# Patient Record
Sex: Female | Born: 1945
Health system: Southern US, Community
[De-identification: ages and names within clinical notes are randomized; demographics above are authoritative.]

## PROBLEM LIST (undated history)

## (undated) DIAGNOSIS — M7710 Lateral epicondylitis, unspecified elbow: Secondary | ICD-10-CM

## (undated) DIAGNOSIS — C52 Malignant neoplasm of vagina: Secondary | ICD-10-CM

## (undated) DIAGNOSIS — I10 Essential (primary) hypertension: Secondary | ICD-10-CM

## (undated) DIAGNOSIS — E669 Obesity, unspecified: Secondary | ICD-10-CM

## (undated) DIAGNOSIS — H409 Unspecified glaucoma: Secondary | ICD-10-CM

## (undated) DIAGNOSIS — J309 Allergic rhinitis, unspecified: Secondary | ICD-10-CM

## (undated) DIAGNOSIS — D72819 Decreased white blood cell count, unspecified: Secondary | ICD-10-CM

## (undated) DIAGNOSIS — L259 Unspecified contact dermatitis, unspecified cause: Secondary | ICD-10-CM

## (undated) DIAGNOSIS — IMO0002 Reserved for concepts with insufficient information to code with codable children: Secondary | ICD-10-CM

## (undated) DIAGNOSIS — N6019 Diffuse cystic mastopathy of unspecified breast: Secondary | ICD-10-CM

## (undated) DIAGNOSIS — E7800 Pure hypercholesterolemia, unspecified: Secondary | ICD-10-CM

## (undated) DIAGNOSIS — K589 Irritable bowel syndrome without diarrhea: Secondary | ICD-10-CM

## (undated) DIAGNOSIS — K3189 Other diseases of stomach and duodenum: Secondary | ICD-10-CM

## (undated) DIAGNOSIS — K2100 Gastro-esophageal reflux disease with esophagitis, without bleeding: Secondary | ICD-10-CM

## (undated) DIAGNOSIS — F411 Generalized anxiety disorder: Secondary | ICD-10-CM

## (undated) DIAGNOSIS — N644 Mastodynia: Secondary | ICD-10-CM

## (undated) DIAGNOSIS — L989 Disorder of the skin and subcutaneous tissue, unspecified: Secondary | ICD-10-CM

## (undated) DIAGNOSIS — R1013 Epigastric pain: Secondary | ICD-10-CM

## (undated) HISTORY — DX: Allergic rhinitis, unspecified: J30.9

## (undated) HISTORY — DX: Gastro-esophageal reflux disease with esophagitis, without bleeding: K21.00

## (undated) HISTORY — DX: Disorder of the skin and subcutaneous tissue, unspecified: L98.9

## (undated) HISTORY — DX: Essential (primary) hypertension: I10

## (undated) HISTORY — DX: Mastodynia: N64.4

## (undated) HISTORY — DX: Decreased white blood cell count, unspecified: D72.819

## (undated) HISTORY — DX: Reserved for concepts with insufficient information to code with codable children: IMO0002

## (undated) HISTORY — DX: Other diseases of stomach and duodenum: R10.13

## (undated) HISTORY — DX: Unspecified contact dermatitis, unspecified cause: L25.9

## (undated) HISTORY — DX: Generalized anxiety disorder: F41.1

## (undated) HISTORY — PX: BREAST EXCISIONAL BIOPSY: SUR124

## (undated) HISTORY — DX: Unspecified glaucoma: H40.9

## (undated) HISTORY — DX: Lateral epicondylitis, unspecified elbow: M77.10

## (undated) HISTORY — DX: Pure hypercholesterolemia, unspecified: E78.00

## (undated) HISTORY — DX: Diffuse cystic mastopathy of unspecified breast: N60.19

## (undated) HISTORY — DX: Irritable bowel syndrome, unspecified: K58.9

## (undated) HISTORY — PX: BREAST LUMPECTOMY: SHX2

## (undated) HISTORY — DX: Malignant neoplasm of vagina: C52

## (undated) HISTORY — DX: Other diseases of stomach and duodenum: K31.89

## (undated) HISTORY — DX: Gastro-esophageal reflux disease with esophagitis: K21.0

## (undated) HISTORY — DX: Obesity, unspecified: E66.9

---

## 2005-01-08 ENCOUNTER — Ambulatory Visit: Payer: Self-pay

## 2006-01-14 ENCOUNTER — Ambulatory Visit: Payer: Self-pay | Admitting: Family Medicine

## 2006-04-14 ENCOUNTER — Ambulatory Visit: Payer: Self-pay | Admitting: Family Medicine

## 2006-08-26 ENCOUNTER — Ambulatory Visit: Payer: Self-pay | Admitting: Gastroenterology

## 2006-08-26 HISTORY — PX: COLONOSCOPY: SHX174

## 2007-01-18 ENCOUNTER — Ambulatory Visit: Payer: Self-pay | Admitting: Family Medicine

## 2007-04-12 LAB — HM COLONOSCOPY: HM COLON: NORMAL

## 2007-07-01 ENCOUNTER — Ambulatory Visit: Payer: Self-pay | Admitting: Family Medicine

## 2008-02-14 ENCOUNTER — Ambulatory Visit: Payer: Self-pay | Admitting: Family Medicine

## 2008-03-26 ENCOUNTER — Ambulatory Visit: Payer: Self-pay | Admitting: Family Medicine

## 2008-07-23 ENCOUNTER — Ambulatory Visit: Payer: Self-pay | Admitting: Family Medicine

## 2009-02-15 ENCOUNTER — Ambulatory Visit: Payer: Self-pay | Admitting: Family Medicine

## 2009-05-02 ENCOUNTER — Ambulatory Visit: Payer: Self-pay | Admitting: Family Medicine

## 2010-01-07 ENCOUNTER — Ambulatory Visit: Payer: Self-pay | Admitting: Family Medicine

## 2010-04-10 ENCOUNTER — Ambulatory Visit: Payer: Self-pay | Admitting: Family Medicine

## 2010-07-01 ENCOUNTER — Ambulatory Visit: Payer: Self-pay | Admitting: Internal Medicine

## 2010-07-16 ENCOUNTER — Ambulatory Visit: Payer: Self-pay | Admitting: Family Medicine

## 2010-12-30 ENCOUNTER — Ambulatory Visit: Payer: Self-pay | Admitting: Family Medicine

## 2011-04-02 LAB — HM PAP SMEAR: HM PAP: NORMAL

## 2011-04-24 ENCOUNTER — Ambulatory Visit: Payer: Self-pay | Admitting: Family Medicine

## 2012-05-12 ENCOUNTER — Ambulatory Visit: Payer: Self-pay | Admitting: Family Medicine

## 2012-08-22 ENCOUNTER — Ambulatory Visit: Payer: Self-pay | Admitting: Family Medicine

## 2013-05-01 ENCOUNTER — Encounter: Payer: Self-pay | Admitting: Cardiovascular Disease

## 2013-05-01 ENCOUNTER — Encounter: Payer: Self-pay | Admitting: *Deleted

## 2013-05-01 ENCOUNTER — Encounter (INDEPENDENT_AMBULATORY_CARE_PROVIDER_SITE_OTHER): Payer: Self-pay

## 2013-05-01 ENCOUNTER — Ambulatory Visit (INDEPENDENT_AMBULATORY_CARE_PROVIDER_SITE_OTHER): Payer: 59 | Admitting: Cardiovascular Disease

## 2013-05-01 VITALS — BP 106/74 | HR 66 | Ht 62.0 in | Wt 169.5 lb

## 2013-05-01 DIAGNOSIS — R079 Chest pain, unspecified: Secondary | ICD-10-CM | POA: Insufficient documentation

## 2013-05-01 DIAGNOSIS — M79602 Pain in left arm: Secondary | ICD-10-CM

## 2013-05-01 DIAGNOSIS — M79609 Pain in unspecified limb: Secondary | ICD-10-CM

## 2013-05-01 DIAGNOSIS — I10 Essential (primary) hypertension: Secondary | ICD-10-CM

## 2013-05-01 DIAGNOSIS — R9431 Abnormal electrocardiogram [ECG] [EKG]: Secondary | ICD-10-CM

## 2013-05-01 NOTE — Progress Notes (Signed)
Primary care physician: Dr. Thana Ates  HPI  This is a pleasant 67 year old African American female who was referred for evaluation of chest pain and abnormal EKG. She has no previous cardiac history. She has known history of hypertension which has been well-controlled on medications. She is not diabetic. There is no family history of premature coronary artery disease. She has been having discomfort at the left upper chest area close to the shoulder with some left arm discomfort. The discomfort is described as aching sensation. This happens mostly at rest and does not worsen with physical activities. She has mild exertional dyspnea. No orthopnea or PND. She denies any lower extremity edema. Recent EKG shows sinus rhythm with diffuse J-point elevation suggestive of early repolarization.  No Known Allergies   No current outpatient prescriptions on file prior to visit.   No current facility-administered medications on file prior to visit.     Past Medical History  Diagnosis Date  . Unspecified disorder of skin and subcutaneous tissue   . Allergic rhinitis, cause unspecified   . Obesity, unspecified   . Leukocytopenia, unspecified   . Contact dermatitis and other eczema, due to unspecified cause   . Reflux esophagitis   . Essential hypertension, benign   . Anxiety state, unspecified   . Thoracic or lumbosacral neuritis or radiculitis, unspecified   . Pure hypercholesterolemia   . Dyspepsia and other specified disorders of function of stomach   . Tietze's disease   . Mastodynia   . Diffuse cystic mastopathy   . Irritable bowel syndrome   . Lateral epicondylitis  of elbow   . Vaginal cancer     History     Past Surgical History  Procedure Laterality Date  . Breast lumpectomy Left   . Colonoscopy       Family History  Problem Relation Age of Onset  . Family history unknown: Yes     History   Social History  . Marital Status: Widowed    Spouse Name: N/A    Number of  Children: N/A  . Years of Education: N/A   Occupational History  . Not on file.   Social History Main Topics  . Smoking status: Never Smoker   . Smokeless tobacco: Not on file  . Alcohol Use: No  . Drug Use: No  . Sexual Activity: Not on file   Other Topics Concern  . Not on file   Social History Narrative  . No narrative on file     ROS A 10 point review of system was performed. It is negative other than that mentioned in the history of present illness.   PHYSICAL EXAM   BP 106/74  Pulse 66  Ht 5\' 2"  (1.575 m)  Wt 169 lb 8 oz (76.885 kg)  BMI 30.99 kg/m2 Constitutional: She is oriented to person, place, and time. She appears well-developed and well-nourished. No distress.  HENT: No nasal discharge.  Head: Normocephalic and atraumatic.  Eyes: Pupils are equal and round. No discharge.  Neck: Normal range of motion. Neck supple. No JVD present. No thyromegaly present.  Cardiovascular: Normal rate, regular rhythm, normal heart sounds. Exam reveals no gallop and no friction rub. No murmur heard.  Pulmonary/Chest: Effort normal and breath sounds normal. No stridor. No respiratory distress. She has no wheezes. She has no rales. She exhibits no tenderness.  Abdominal: Soft. Bowel sounds are normal. She exhibits no distension. There is no tenderness. There is no rebound and no guarding.  Musculoskeletal: Normal range of  motion. She exhibits no edema and no tenderness.  Neurological: She is alert and oriented to person, place, and time. Coordination normal.  Skin: Skin is warm and dry. No rash noted. She is not diaphoretic. No erythema. No pallor.  Psychiatric: She has a normal mood and affect. Her behavior is normal. Judgment and thought content normal.     EKG: Sinus  Rhythm  Minor diffuse ST elevation suggestive of early repolarization   -Prominent R(V1) -nonspecific.   ABNORMAL    ASSESSMENT AND PLAN

## 2013-05-01 NOTE — Patient Instructions (Signed)
Your physician has requested that you have a stress echocardiogram. For further information please visit [email or web address] Please follow instruction sheet as given.  Hold verapamil the day of the test.   Follow up as needed

## 2013-05-01 NOTE — Assessment & Plan Note (Addendum)
The upper left chest and arm discomfort is overall atypical and possibly musculoskeletal in etiology. She does have mild exertional dyspnea and risk factors for coronary artery disease. EKG shows minor ST changes likely due to early repolarization. I recommend further ischemic cardiac evaluation with a stress echocardiogram. This should provide more accurate data than a treadmill stress test given her abnormal baseline EKG. Hold verapamil the day of stress test.

## 2013-05-01 NOTE — Assessment & Plan Note (Signed)
Blood pressure is well controlled on medications. 

## 2013-05-10 ENCOUNTER — Ambulatory Visit: Payer: Self-pay | Admitting: Family Medicine

## 2013-05-10 ENCOUNTER — Telehealth: Payer: Self-pay | Admitting: *Deleted

## 2013-05-10 NOTE — Telephone Encounter (Signed)
Left patient voicemail reminding him to hold verapamil for stress echo tomorrow.

## 2013-05-11 ENCOUNTER — Other Ambulatory Visit (INDEPENDENT_AMBULATORY_CARE_PROVIDER_SITE_OTHER): Payer: 59

## 2013-05-11 DIAGNOSIS — R9431 Abnormal electrocardiogram [ECG] [EKG]: Secondary | ICD-10-CM

## 2013-05-11 DIAGNOSIS — R079 Chest pain, unspecified: Secondary | ICD-10-CM

## 2013-05-11 DIAGNOSIS — M79602 Pain in left arm: Secondary | ICD-10-CM

## 2013-08-30 ENCOUNTER — Ambulatory Visit: Payer: Self-pay | Admitting: Family Medicine

## 2014-05-23 LAB — LIPID PANEL
Cholesterol: 226 mg/dL — AB (ref 0–200)
HDL: 57 mg/dL (ref 35–70)
LDL CALC: 150 mg/dL

## 2014-06-01 LAB — HM DEXA SCAN

## 2014-06-01 LAB — HM MAMMOGRAPHY: HM Mammogram: NORMAL

## 2014-06-13 ENCOUNTER — Ambulatory Visit: Payer: Self-pay | Admitting: Family Medicine

## 2014-07-09 DIAGNOSIS — H2513 Age-related nuclear cataract, bilateral: Secondary | ICD-10-CM | POA: Diagnosis not present

## 2015-01-29 ENCOUNTER — Other Ambulatory Visit: Payer: Self-pay | Admitting: Family Medicine

## 2015-02-14 ENCOUNTER — Ambulatory Visit (INDEPENDENT_AMBULATORY_CARE_PROVIDER_SITE_OTHER): Payer: Medicare PPO | Admitting: Family Medicine

## 2015-02-14 ENCOUNTER — Encounter: Payer: Self-pay | Admitting: Family Medicine

## 2015-02-14 DIAGNOSIS — I10 Essential (primary) hypertension: Secondary | ICD-10-CM

## 2015-02-14 DIAGNOSIS — G629 Polyneuropathy, unspecified: Secondary | ICD-10-CM | POA: Diagnosis not present

## 2015-02-14 DIAGNOSIS — Z23 Encounter for immunization: Secondary | ICD-10-CM

## 2015-02-14 DIAGNOSIS — E785 Hyperlipidemia, unspecified: Secondary | ICD-10-CM

## 2015-02-14 DIAGNOSIS — R29898 Other symptoms and signs involving the musculoskeletal system: Secondary | ICD-10-CM | POA: Diagnosis not present

## 2015-02-14 MED ORDER — FLUTICASONE PROPIONATE 50 MCG/ACT NA SUSP: 2.0000 | Freq: Every day | NASAL | Status: DC

## 2015-02-14 NOTE — Progress Notes (Signed)
Name: Vickie Stewart   MRN: [id number]    DOB: May 05, 1946   Date:02/14/2015       Progress Note  Subjective  Chief Complaint  Chief Complaint  Patient presents with  . Hyperlipidemia  . Hypertension  . Anxiety    HPI  Hypertension   Patient presents for follow-up of hypertension. It has been present for over over 5 years.  Patient states that there is compliance with medical regimen which consists of lisinopril HCT. There is no end organ disease. Cardiac risk factors include hypertension hyperlipidemia and sedentary lifestyle.   Exercise regimen consist of minimal.  Diet consist of some sodium restriction .  Hyperlipidemia  Patient has a history of hyperlipidemia for over 5 years.  Current medical regimen consist of none as she is leery about taking a statin.  Compliance is variable .  Diet and exercise are currently followed minimally .  Risk factors for cardiovascular disease include hyperlipidemia and hypertension and sedentary lifestyle .   There have been no side effects from the medication.    Anxiety history of present illness   Patient has a history of chronic anxiety. She has no somatic complaints and had numerous breath. She is resistant to taking an SSRI or anxiolytic   Past Medical History  Diagnosis Date  . Unspecified disorder of skin and subcutaneous tissue   . Allergic rhinitis, cause unspecified   . Obesity, unspecified   . Leukocytopenia, unspecified   . Contact dermatitis and other eczema, due to unspecified cause   . Reflux esophagitis   . Anxiety state, unspecified   . Thoracic or lumbosacral neuritis or radiculitis, unspecified   . Pure hypercholesterolemia   . Dyspepsia and other specified disorders of function of stomach   . Tietze's disease   . Mastodynia   . Diffuse cystic mastopathy   . Irritable bowel syndrome   . Lateral epicondylitis  of elbow   . Vaginal cancer     History  . Essential hypertension, benign     Social History   Substance Use Topics  . Smoking status: Never Smoker   . Smokeless tobacco: Not on file  . Alcohol Use: No     Current outpatient prescriptions:  .  aspirin 81 MG tablet, Take 81 mg by mouth daily., Disp: , Rfl:  .  cholecalciferol (VITAMIN D) 1000 UNITS tablet, Take 1,000 Units by mouth daily., Disp: , Rfl:  .  lisinopril-hydrochlorothiazide (PRINZIDE,ZESTORETIC) 20-25 MG per tablet, Take 1 tablet by mouth daily., Disp: , Rfl:  .  meloxicam (MOBIC) 7.5 MG tablet, Take 7.5 mg by mouth as needed. , Disp: , Rfl:  .  Multiple Vitamin (MULTIVITAMIN) tablet, Take 1 tablet by mouth daily., Disp: , Rfl:  .  omeprazole (PRILOSEC) 20 MG capsule, TAKE ONE CAPSULE BY MOUTH TWICE A DAY, Disp: 180 capsule, Rfl: 1 .  triamcinolone cream (KENALOG) 0.1 %, Apply 1 application topically 2 (two) times daily., Disp: , Rfl:  .  verapamil (VERELAN PM) 360 MG 24 hr capsule, Take 1 capsule (360 mg total) by mouth daily., Disp: , Rfl:  .  vitamin C (ASCORBIC ACID) 500 MG tablet, Take 500 mg by mouth daily., Disp: , Rfl:  .  vitamin E 400 UNIT capsule, Take 400 Units by mouth daily., Disp: , Rfl:   No Known Allergies  Review of Systems  Constitutional: Negative for fever, chills and weight loss.  HENT: Negative for congestion, hearing loss, sore throat and tinnitus.   Eyes: Negative for blurred  vision, double vision and redness.  Respiratory: Negative for cough, hemoptysis and shortness of breath.   Cardiovascular: Negative for chest pain, palpitations, orthopnea, claudication and leg swelling.  Gastrointestinal: Negative for heartburn, nausea, vomiting, diarrhea, constipation and blood in stool.  Genitourinary: Negative for dysuria, urgency, frequency and hematuria.  Musculoskeletal: Negative for myalgias, back pain, joint pain, falls and neck pain.  Skin: Negative for itching.  Neurological: Positive for weakness. Negative for dizziness, tingling, tremors (no cogwheeling), focal weakness, seizures, loss of  consciousness and headaches.  Endo/Heme/Allergies: Does not bruise/bleed easily.  Psychiatric/Behavioral: Negative for depression, suicidal ideas and substance abuse. The patient is nervous/anxious and has insomnia.      Objective  Filed Vitals:   02/14/15 0941  BP: 126/72  Pulse: 64  Temp: 98.1 F (36.7 C)  Resp: 16  Height: 5\' 2"  (1.575 m)  Weight: 171 lb 2 oz (77.622 kg)  SpO2: 97%     Physical Exam    Assessment & Plan

## 2015-02-18 DIAGNOSIS — G629 Polyneuropathy, unspecified: Secondary | ICD-10-CM | POA: Diagnosis not present

## 2015-02-18 DIAGNOSIS — E785 Hyperlipidemia, unspecified: Secondary | ICD-10-CM | POA: Diagnosis not present

## 2015-02-18 DIAGNOSIS — R29898 Other symptoms and signs involving the musculoskeletal system: Secondary | ICD-10-CM | POA: Diagnosis not present

## 2015-02-19 LAB — LIPID PANEL
CHOL/HDL RATIO: 4 ratio (ref 0.0–4.4)
Cholesterol, Total: 217 mg/dL — ABNORMAL HIGH (ref 100–199)
HDL: 54 mg/dL (ref >39)
LDL Calculated: 142 mg/dL — ABNORMAL HIGH (ref 0–99)
TRIGLYCERIDES: 105 mg/dL (ref 0–149)
VLDL Cholesterol Cal: 21 mg/dL (ref 5–40)

## 2015-02-19 LAB — COMPREHENSIVE METABOLIC PANEL
A/G RATIO: 2 (ref 1.1–2.5)
ALBUMIN: 4.3 g/dL (ref 3.6–4.8)
ALT: 25 IU/L (ref 0–32)
AST: 23 IU/L (ref 0–40)
Alkaline Phosphatase: 61 IU/L (ref 39–117)
BUN / CREAT RATIO: 22 (ref 11–26)
BUN: 16 mg/dL (ref 8–27)
Bilirubin Total: 0.4 mg/dL (ref 0.0–1.2)
CALCIUM: 9.5 mg/dL (ref 8.7–10.3)
CO2: 23 mmol/L (ref 18–29)
Chloride: 103 mmol/L (ref 97–108)
Creatinine, Ser: 0.74 mg/dL (ref 0.57–1.00)
GFR calc Af Amer: 97 mL/min/{1.73_m2} (ref >59)
GFR, EST NON AFRICAN AMERICAN: 84 mL/min/{1.73_m2} (ref >59)
Globulin, Total: 2.2 g/dL (ref 1.5–4.5)
Glucose: 89 mg/dL (ref 65–99)
Potassium: 4 mmol/L (ref 3.5–5.2)
SODIUM: 142 mmol/L (ref 134–144)
Total Protein: 6.5 g/dL (ref 6.0–8.5)

## 2015-02-19 LAB — CBC
Hematocrit: 42.4 % (ref 34.0–46.6)
Hemoglobin: 14.4 g/dL (ref 11.1–15.9)
MCH: 29.9 pg (ref 26.6–33.0)
MCHC: 34 g/dL (ref 31.5–35.7)
MCV: 88 fL (ref 79–97)
PLATELETS: 239 10*3/uL (ref 150–379)
RBC: 4.82 x10E6/uL (ref 3.77–5.28)
RDW: 13.7 % (ref 12.3–15.4)
WBC: 3.7 10*3/uL (ref 3.4–10.8)

## 2015-02-19 LAB — TSH: TSH: 2.81 u[IU]/mL (ref 0.450–4.500)

## 2015-02-19 LAB — MAGNESIUM: Magnesium: 1.9 mg/dL (ref 1.6–2.3)

## 2015-02-19 LAB — VITAMIN B12: Vitamin B-12: 724 pg/mL (ref 211–946)

## 2015-02-21 ENCOUNTER — Telehealth: Payer: Self-pay | Admitting: Emergency Medicine

## 2015-02-21 ENCOUNTER — Encounter: Payer: Self-pay | Admitting: Family Medicine

## 2015-02-21 NOTE — Telephone Encounter (Signed)
Patient notified of lab results

## 2015-03-11 ENCOUNTER — Other Ambulatory Visit: Payer: Self-pay | Admitting: Family Medicine

## 2015-03-12 ENCOUNTER — Other Ambulatory Visit: Payer: Self-pay | Admitting: Family Medicine

## 2015-04-24 ENCOUNTER — Other Ambulatory Visit: Payer: Self-pay | Admitting: Family Medicine

## 2015-04-24 NOTE — Telephone Encounter (Signed)
Patient requesting refill. 

## 2015-04-29 ENCOUNTER — Other Ambulatory Visit: Payer: Self-pay | Admitting: Family Medicine

## 2015-05-13 ENCOUNTER — Encounter: Payer: Self-pay | Admitting: Family Medicine

## 2015-05-13 ENCOUNTER — Ambulatory Visit (INDEPENDENT_AMBULATORY_CARE_PROVIDER_SITE_OTHER): Payer: Medicare PPO | Admitting: Family Medicine

## 2015-05-13 VITALS — BP 122/64 | HR 77 | Temp 98.3°F | Resp 18 | Ht 62.0 in | Wt 173.4 lb

## 2015-05-13 DIAGNOSIS — Z0000 Encounter for general adult medical examination without abnormal findings: Secondary | ICD-10-CM | POA: Insufficient documentation

## 2015-05-13 DIAGNOSIS — I10 Essential (primary) hypertension: Secondary | ICD-10-CM

## 2015-05-13 DIAGNOSIS — M150 Primary generalized (osteo)arthritis: Secondary | ICD-10-CM

## 2015-05-13 DIAGNOSIS — K219 Gastro-esophageal reflux disease without esophagitis: Secondary | ICD-10-CM | POA: Diagnosis not present

## 2015-05-13 DIAGNOSIS — E669 Obesity, unspecified: Secondary | ICD-10-CM

## 2015-05-13 DIAGNOSIS — Z8544 Personal history of malignant neoplasm of other female genital organs: Secondary | ICD-10-CM

## 2015-05-13 DIAGNOSIS — J309 Allergic rhinitis, unspecified: Secondary | ICD-10-CM | POA: Insufficient documentation

## 2015-05-13 DIAGNOSIS — M79673 Pain in unspecified foot: Secondary | ICD-10-CM | POA: Diagnosis not present

## 2015-05-13 DIAGNOSIS — M1990 Unspecified osteoarthritis, unspecified site: Secondary | ICD-10-CM | POA: Insufficient documentation

## 2015-05-13 DIAGNOSIS — M216X1 Other acquired deformities of right foot: Secondary | ICD-10-CM

## 2015-05-13 DIAGNOSIS — M159 Polyosteoarthritis, unspecified: Secondary | ICD-10-CM

## 2015-05-13 NOTE — Progress Notes (Signed)
Name: Vickie Stewart   MRN: [id number]    DOB: November 18, 1945   Date:05/13/2015       Progress Note  Subjective  Chief Complaint  Chief Complaint  Patient presents with  . Annual Exam    HPI  Patient presenting for annual H&P at age 69.   Foot pain  Patient complains of bilateral foot pain greater on the right than left. 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 She is worrying arch support shoes in the past these seem to worsen the problem and time.  Joint pain  patient complains of generalized joint pain. She is taking over-the-counter Tylenol for this in the past    Depression screen [facility] 2/9 05/13/2015 02/14/2015  Decreased Interest 0 0  Down, Depressed, Hopeless 0 0  PHQ - 2 Score 0 0    Fall Risk  05/13/2015 02/14/2015  Falls in the past year? No No   Functional Status Survey: Is the patient deaf or have difficulty hearing?: No Does the patient have difficulty seeing, even when wearing glasses/contacts?: No Does the patient have difficulty concentrating, remembering, or making decisions?: No Does the patient have difficulty walking or climbing stairs?: No Does the patient have difficulty dressing or bathing?: No Does the patient have difficulty doing errands alone such as visiting a doctor's office or shopping?: No  Past Medical History  Diagnosis Date  . Unspecified disorder of skin and subcutaneous tissue   . Allergic rhinitis, cause unspecified   . Obesity, unspecified   . Leukocytopenia, unspecified   . Contact dermatitis and other eczema, due to unspecified cause   . Reflux esophagitis   . Anxiety state, unspecified   . Thoracic or lumbosacral neuritis or radiculitis, unspecified   . Pure hypercholesterolemia   . Dyspepsia and other specified disorders of function of stomach   . Tietze's disease   . Mastodynia   . Diffuse cystic mastopathy   . Irritable bowel syndrome   . Lateral epicondylitis   of elbow   . Vaginal cancer ([place])     History  . Essential hypertension, benign     Social History  Substance Use Topics  . Smoking status: Never Smoker   . Smokeless tobacco: Not on file  . Alcohol Use: No     Current outpatient prescriptions:  .  aspirin 81 MG tablet, Take 81 mg by mouth daily., Disp: , Rfl:  .  cholecalciferol (VITAMIN D) 1000 UNITS tablet, Take 1,000 Units by mouth daily., Disp: , Rfl:  .  fluticasone (FLONASE) 50 MCG/ACT nasal spray, Place 2 sprays into both nostrils daily., Disp: 16 g, Rfl: 6 .  lisinopril (PRINIVIL,ZESTRIL) 20 MG tablet, TAKE 1 TABLET BY MOUTH EVERY DAY, Disp: 90 tablet, Rfl: 1 .  lisinopril-hydrochlorothiazide (PRINZIDE,ZESTORETIC) 20-25 MG per tablet, Take 1 tablet by mouth daily., Disp: , Rfl:  .  meloxicam (MOBIC) 7.5 MG tablet, Take 7.5 mg by mouth as needed. , Disp: , Rfl:  .  Multiple Vitamin (MULTIVITAMIN) tablet, Take 1 tablet by mouth daily., Disp: , Rfl:  .  omeprazole (PRILOSEC) 20 MG capsule, TAKE ONE CAPSULE BY MOUTH TWICE A DAY, Disp: 180 capsule, Rfl: 1 .  triamcinolone cream (KENALOG) 0.1 %, APPLY TO AFFECTED AREA EXTERNALLY 2 TO 3 TIMES A DAY FOR RASH, Disp: 90 g, Rfl: 0 .  verapamil (VERELAN PM) 360 MG 24 hr capsule, TAKE ONE CAPSULE BY MOUTH ONCE A DAY, Disp: 90 capsule, Rfl: 1 .  vitamin C (ASCORBIC ACID) 500 MG tablet, Take 500 mg by mouth daily., Disp: , Rfl:  .  vitamin E 400 UNIT capsule, Take 400 Units by mouth daily., Disp: , Rfl:   No Known Allergies  Review of Systems  Constitutional: Negative for fever, chills and weight loss.  HENT: Negative for congestion, hearing loss, sore throat and tinnitus.   Eyes: Negative for blurred vision, double vision and redness.  Respiratory: Negative for cough, hemoptysis and shortness of breath.   Cardiovascular: Positive for chest pain, palpitations and leg swelling. Negative for orthopnea and claudication.  Gastrointestinal: Negative for heartburn, nausea, vomiting,  diarrhea, constipation and blood in stool.  Genitourinary: Negative for dysuria, urgency, frequency and hematuria.  Musculoskeletal: Positive for joint pain. Negative for myalgias, back pain, falls and neck pain.  Skin: Negative for itching.  Neurological: Positive for headaches. Negative for dizziness, tingling, tremors, focal weakness, seizures, loss of consciousness and weakness.  Endo/Heme/Allergies: Does not bruise/bleed easily.  Psychiatric/Behavioral: Negative for depression and substance abuse. The patient is nervous/anxious and has insomnia.      Objective  Filed Vitals:   05/13/15 1102  BP: 122/64  Pulse: 77  Temp: 98.3 F (36.8 C)  Resp: 18  Height: 5\' 2"  (1.575 m)  Weight: 173 lb 7 oz (78.671 kg)  SpO2: 97%     Physical Exam  Constitutional: She is oriented to person, place, and time and well-developed, well-nourished, and in no distress.  HENT:  Head: Normocephalic.  Eyes: EOM are normal. Pupils are equal, round, and reactive to light.  Neck: Normal range of motion. No thyromegaly present.  Cardiovascular: Normal rate, regular rhythm and normal heart sounds.   No murmur heard. Pulmonary/Chest: Effort normal and breath sounds normal.  Breasts show some thickening left breast at about 1:00 measuring 2 x 4 cm  Abdominal: Soft. Bowel sounds are normal.  Genitourinary: Vagina normal, uterus normal and cervix normal. Guaiac negative stool. No vaginal discharge found.  Musculoskeletal: Normal range of motion. She exhibits no edema.  Neurological: She is alert and oriented to person, place, and time. No cranial nerve deficit. Gait normal.  Skin: Skin is warm and dry. No rash noted.  Psychiatric: Memory and affect normal.      Assessment & Plan 1. Annual physical exam  - MM Digital Diagnostic Unilat R; Future - Pap IG w/ reflex to HPV when ASC-U - POC Hemoccult Bld/Stl (1-Cd Office Dx)  2. Essential hypertension, benign Well-controlled  3. Obesity Diet and  exercise  4. History of cancer of vagina Pap today  5. Foot pain, unspecified laterality Referral to podiatrist - Ambulatory referral to Podiatry  6. Primary osteoarthritis involving multiple joints Call when necessary  7. Allergic rhinitis, unspecified allergic rhinitis type Consider using Zyrtec and Pataday eyedrops  8. Gastroesophageal reflux disease without esophagitis PPI  9. Pes cavus, right Podiatrist

## 2015-05-15 LAB — PAP IG W/ RFLX HPV ASCU: PAP SMEAR COMMENT: 0

## 2015-07-26 ENCOUNTER — Ambulatory Visit (INDEPENDENT_AMBULATORY_CARE_PROVIDER_SITE_OTHER): Payer: Medicare PPO | Admitting: Family Medicine

## 2015-07-26 ENCOUNTER — Other Ambulatory Visit: Payer: Self-pay | Admitting: Family Medicine

## 2015-07-26 ENCOUNTER — Encounter: Payer: Self-pay | Admitting: Family Medicine

## 2015-07-26 ENCOUNTER — Ambulatory Visit: Payer: Medicare PPO | Admitting: Family Medicine

## 2015-07-26 VITALS — BP 128/72 | HR 62 | Temp 97.9°F | Resp 12 | Wt 172.3 lb

## 2015-07-26 DIAGNOSIS — K589 Irritable bowel syndrome without diarrhea: Secondary | ICD-10-CM | POA: Insufficient documentation

## 2015-07-26 DIAGNOSIS — J309 Allergic rhinitis, unspecified: Secondary | ICD-10-CM | POA: Diagnosis not present

## 2015-07-26 DIAGNOSIS — I10 Essential (primary) hypertension: Secondary | ICD-10-CM

## 2015-07-26 DIAGNOSIS — E785 Hyperlipidemia, unspecified: Secondary | ICD-10-CM

## 2015-07-26 DIAGNOSIS — E042 Nontoxic multinodular goiter: Secondary | ICD-10-CM | POA: Insufficient documentation

## 2015-07-26 DIAGNOSIS — Z131 Encounter for screening for diabetes mellitus: Secondary | ICD-10-CM

## 2015-07-26 DIAGNOSIS — Z8544 Personal history of malignant neoplasm of other female genital organs: Secondary | ICD-10-CM | POA: Insufficient documentation

## 2015-07-26 DIAGNOSIS — M545 Low back pain, unspecified: Secondary | ICD-10-CM | POA: Insufficient documentation

## 2015-07-26 DIAGNOSIS — Z23 Encounter for immunization: Secondary | ICD-10-CM

## 2015-07-26 DIAGNOSIS — K219 Gastro-esophageal reflux disease without esophagitis: Secondary | ICD-10-CM | POA: Diagnosis not present

## 2015-07-26 DIAGNOSIS — E669 Obesity, unspecified: Secondary | ICD-10-CM | POA: Insufficient documentation

## 2015-07-26 DIAGNOSIS — Z780 Asymptomatic menopausal state: Secondary | ICD-10-CM | POA: Insufficient documentation

## 2015-07-26 DIAGNOSIS — B001 Herpesviral vesicular dermatitis: Secondary | ICD-10-CM | POA: Insufficient documentation

## 2015-07-26 DIAGNOSIS — Z8639 Personal history of other endocrine, nutritional and metabolic disease: Secondary | ICD-10-CM

## 2015-07-26 DIAGNOSIS — M8580 Other specified disorders of bone density and structure, unspecified site: Secondary | ICD-10-CM | POA: Insufficient documentation

## 2015-07-26 DIAGNOSIS — Z1231 Encounter for screening mammogram for malignant neoplasm of breast: Secondary | ICD-10-CM

## 2015-07-26 DIAGNOSIS — N6019 Diffuse cystic mastopathy of unspecified breast: Secondary | ICD-10-CM | POA: Insufficient documentation

## 2015-07-26 MED ORDER — TIZANIDINE HCL 4 MG PO TABS: 4.0000 mg | ORAL_TABLET | Freq: Every day | ORAL | Status: DC

## 2015-07-26 MED ORDER — MELOXICAM 7.5 MG PO TABS: 7.5000 mg | ORAL_TABLET | Freq: Two times a day (BID) | ORAL | Status: DC

## 2015-07-26 NOTE — Addendum Note (Signed)
Addended by: Inda Coke on: 07/26/2015 09:59 AM   Modules accepted: Orders

## 2015-07-26 NOTE — Progress Notes (Signed)
Name: Vickie Stewart   MRN: [id number]    DOB: [birth year]   Date:07/26/2015       Progress Note  Subjective  Chief Complaint  Chief Complaint  Patient presents with  . Follow-up    patient is here for her 16-monthf/u and review of labs  . Medication Refill    HPI  Intermittent Low back : she works in [employer] and has to lift heavy objects, she has been taking Flexeril and Meloxicam prn. Explained Flexeril no longer covered by her insurance and she wants to try Tizanidine instead. She described as sharp, intermittent , no radiculitis.   AR: she has nasal congestion, but stopped nasal spray because of nose bleed. Explained on how to use it correctly.  Hyperlipidemia: not taking medications - because Atorvastatin caused some confusion. She is on diet only and would like to have labs rechecked  GERD: she has not been taking PPI, she denies regurgitation or heartburn at this time  Obesity: she will try to join [employer] she is also cutting down on sweets, bread and sodas.   Patient Active Problem List   Diagnosis Date Noted  . Thyroid nodule 07/26/2015  . Osteopenia 07/26/2015  . Menopause 07/26/2015  . Irritable colon 07/26/2015  . Cold sore 07/26/2015  . History of cancer of vagina 07/26/2015  . Diffuse cystic mastopathy 07/26/2015  . Obesity (BMI 30.0-34.9) 07/26/2015  . Intermittent low back pain 07/26/2015  . Hyperlipemia 07/26/2015  . Obesity 05/13/2015  . Osteoarthritis 05/13/2015  . Allergic rhinitis 05/13/2015  . GERD (gastroesophageal reflux disease) 05/13/2015  . Chest pain 05/01/2013  . Essential hypertension, benign     Past Surgical History  Procedure Laterality Date  . Breast lumpectomy Left   . Colonoscopy      Family History  Problem Relation Age of Onset  . Hypertension Father     Social History   Social History  . Marital Status: Married    Spouse Name: N/A  . Number of Children: N/A  . Years of Education: N/A    Occupational History  . Not on file.   Social History Main Topics  . Smoking status: Never Smoker   . Smokeless tobacco: Not on file  . Alcohol Use: No  . Drug Use: No  . Sexual Activity: Not on file   Other Topics Concern  . Not on file   Social History Narrative     Current outpatient prescriptions:  .  aspirin 81 MG tablet, Take 81 mg by mouth daily., Disp: , Rfl:  .  lisinopril (PRINIVIL,ZESTRIL) 20 MG tablet, TAKE 1 TABLET BY MOUTH EVERY DAY, Disp: 90 tablet, Rfl: 1 .  Multiple Vitamin (MULTIVITAMIN) tablet, Take 1 tablet by mouth daily., Disp: , Rfl:  .  Omega-3 Fatty Acids (FISH OIL CONCENTRATE PO), Take by mouth., Disp: , Rfl:  .  verapamil (VERELAN PM) 360 MG 24 hr capsule, TAKE ONE CAPSULE BY MOUTH ONCE A DAY, Disp: 90 capsule, Rfl: 1 .  vitamin C (ASCORBIC ACID) 500 MG tablet, Take 500 mg by mouth daily., Disp: , Rfl:  .  vitamin E 400 UNIT capsule, Take 400 Units by mouth daily., Disp: , Rfl:  .  fluticasone (FLONASE) 50 MCG/ACT nasal spray, Place 2 sprays into both nostrils daily. (Patient not taking: Reported on 07/26/2015), Disp: 16 g, Rfl: 6 .  meloxicam (MOBIC) 7.5 MG tablet, Take 1 tablet (7.5 mg total) by mouth 2 (two) times daily., Disp: 60 tablet, Rfl: 1 .  tiZANidine (ZANAFLEX) 4 MG tablet, Take 1 tablet (4 mg total) by mouth at bedtime., Disp: 30 tablet, Rfl: 1 .  triamcinolone cream (KENALOG) 0.1 %, APPLY TO AFFECTED AREA EXTERNALLY 2 TO 3 TIMES A DAY FOR RASH (Patient not taking: Reported on 07/26/2015), Disp: 90 g, Rfl: 0  Allergies  Allergen Reactions  . Atorvastatin     difficulty concentrating and focusing     ROS  Constitutional: Negative for fever or weight change.  Respiratory: Negative for cough and shortness of breath.   Cardiovascular: Negative for chest pain or palpitations.  Gastrointestinal: Negative for abdominal pain, no bowel changes.  Musculoskeletal: Negative for gait problem or joint swelling.  Skin: Negative for rash.   Neurological: Negative for dizziness or headache.  No other specific complaints in a complete review of systems (except as listed in HPI above).  Objective  Filed Vitals:   07/26/15 0858  BP: 128/72  Pulse: 62  Temp: 97.9 F (36.6 C)  TempSrc: Oral  Resp: 12  Weight: 172 lb 4.8 oz (78.155 kg)  SpO2: 96%    Body mass index is 31.51 kg/(m^2).  Physical Exam  Constitutional: Patient appears well-developed and well-nourished. Obese No distress.  HEENT: head atraumatic, normocephalic, pupils equal and reactive to light, neck supple, throat within normal limits Cardiovascular: Normal rate, regular rhythm and normal heart sounds.  No murmur heard. No BLE edema. Pulmonary/Chest: Effort normal and breath sounds normal. No respiratory distress. Abdominal: Soft.  There is no tenderness. Psychiatric: Patient has a normal mood and affect. behavior is normal. Judgment and thought content normal. Muscular Skeletal: no pain during palpation of lumbar spine, negative straight leg raise, normal rom  Recent Results (from the past 2160 hour(s))  Pap IG w/ reflex to HPV when ASC-U     Status: None   Collection Time: 05/13/15 12:00 AM  Result Value Ref Range   DIAGNOSIS: Comment     Comment: NEGATIVE FOR INTRAEPITHELIAL LESION AND MALIGNANCY.   Specimen adequacy: Comment     Comment: Satisfactory for evaluation.   CLINICIAN PROVIDED ICD10: Comment     Comment: Z00.00   Performed by: Comment     Comment: Windell Norfolk, Cytotechnologist (ASCP)   PAP SMEAR COMMENT .    Note: Comment     Comment: The Pap smear is a screening test designed to aid in the detection of premalignant and malignant conditions of the uterine cervix.  It is not a diagnostic procedure and should not be used as the sole means of detecting cervical cancer.  Both false-positive and false-negative reports do occur.    Test Methodology Comment     Comment: This liquid based ThinPrep(R) pap test was screened with the use  of an image guided system.    PAP REFLEX: Comment     Comment: The HPV DNA reflex criteria were not met with this specimen result therefore, no HPV testing was performed.      PHQ2/9: Depression screen [facility] 2/9 07/26/2015 05/13/2015 02/14/2015  Decreased Interest 0 0 0  Down, Depressed, Hopeless 0 0 0  PHQ - 2 Score 0 0 0    Fall Risk: Fall Risk  07/26/2015 05/13/2015 02/14/2015  Falls in the past year? No No No    Functional Status Survey: Is the patient deaf or have difficulty hearing?: No Does the patient have difficulty seeing, even when wearing glasses/contacts?: No Does the patient have difficulty concentrating, remembering, or making decisions?: No Does the patient have difficulty walking or climbing stairs?: No  Does the patient have difficulty dressing or bathing?: No Does the patient have difficulty doing errands alone such as visiting a doctor's office or shopping?: No    Assessment & Plan  1. Essential hypertension, benign  Well controlled.   2. Obesity (BMI 30.0-34.9)  Discussed with the patient the risk posed by an increased BMI. Discussed importance of portion control, calorie counting and at least 150 minutes of physical activity weekly. Avoid sweet beverages and drink more water. Eat at least 6 servings of fruit and vegetables daily   3. Hyperlipemia  - Lipid panel  4. Gastroesophageal reflux disease without esophagitis  Doing well on life style modification   5. Intermittent low back pain  - meloxicam (MOBIC) 7.5 MG tablet; Take 1 tablet (7.5 mg total) by mouth 2 (two) times daily.  Dispense: 60 tablet; Refill: 1 - tiZANidine (ZANAFLEX) 4 MG tablet; Take 1 tablet (4 mg total) by mouth at bedtime.  Dispense: 30 tablet; Refill: 1  6. History of hypercalcemia  - Calcium  7. Diabetes mellitus screening  - Glucose  8. Allergic rhinitis, unspecified allergic rhinitis type

## 2015-07-27 LAB — LIPID PANEL
CHOL/HDL RATIO: 4 ratio (ref 0.0–4.4)
Cholesterol, Total: 216 mg/dL — ABNORMAL HIGH (ref 100–199)
HDL: 54 mg/dL (ref >39)
LDL CALC: 140 mg/dL — AB (ref 0–99)
TRIGLYCERIDES: 108 mg/dL (ref 0–149)
VLDL Cholesterol Cal: 22 mg/dL (ref 5–40)

## 2015-07-27 LAB — CALCIUM: CALCIUM: 10.2 mg/dL (ref 8.7–10.3)

## 2015-07-27 LAB — GLUCOSE, RANDOM: Glucose: 87 mg/dL (ref 65–99)

## 2015-07-31 ENCOUNTER — Ambulatory Visit
Admission: RE | Admit: 2015-07-31 | Discharge: 2015-07-31 | Disposition: A | Discharge status: Home / Self Care | Payer: Medicare Other | Source: Ambulatory Visit | Attending: Family Medicine | Admitting: Family Medicine

## 2015-07-31 ENCOUNTER — Other Ambulatory Visit: Payer: Self-pay | Admitting: Family Medicine

## 2015-07-31 DIAGNOSIS — Z1231 Encounter for screening mammogram for malignant neoplasm of breast: Secondary | ICD-10-CM

## 2015-08-23 ENCOUNTER — Ambulatory Visit (INDEPENDENT_AMBULATORY_CARE_PROVIDER_SITE_OTHER): Payer: Medicare Other

## 2015-08-23 ENCOUNTER — Ambulatory Visit: Payer: Self-pay

## 2015-08-23 ENCOUNTER — Encounter: Payer: Self-pay | Admitting: Sports Medicine

## 2015-08-23 ENCOUNTER — Ambulatory Visit (INDEPENDENT_AMBULATORY_CARE_PROVIDER_SITE_OTHER): Payer: Medicare Other | Admitting: Sports Medicine

## 2015-08-23 DIAGNOSIS — M79671 Pain in right foot: Secondary | ICD-10-CM

## 2015-08-23 DIAGNOSIS — M79672 Pain in left foot: Secondary | ICD-10-CM

## 2015-08-23 DIAGNOSIS — M792 Neuralgia and neuritis, unspecified: Secondary | ICD-10-CM

## 2015-08-23 DIAGNOSIS — L603 Nail dystrophy: Secondary | ICD-10-CM

## 2015-08-23 NOTE — Progress Notes (Signed)
Patient ID: Vickie Stewart, female   DOB: 11-23-1945, 70 y.o.   MRN: [id number] Subjective: Vickie Stewart is a 70 y.o. female patient seen today in office for evaluation of nail and burning pain to toes/balls; patient reports that she started to cancel her appointment because the pain and her nails are doing better; she soaked with Epsom salt. Patient denies history of Diabetes, Neuropathy, or Vascular disease. Patient has no other pedal complaints at this time.   Patient Active Problem List   Diagnosis Date Noted  . Thyroid nodule 07/26/2015  . Osteopenia 07/26/2015  . Menopause 07/26/2015  . Irritable colon 07/26/2015  . Cold sore 07/26/2015  . History of cancer of vagina 07/26/2015  . Diffuse cystic mastopathy 07/26/2015  . Obesity (BMI 30.0-34.9) 07/26/2015  . Intermittent low back pain 07/26/2015  . Hyperlipemia 07/26/2015  . Obesity 05/13/2015  . Osteoarthritis 05/13/2015  . Allergic rhinitis 05/13/2015  . GERD (gastroesophageal reflux disease) 05/13/2015  . Chest pain 05/01/2013  . Essential hypertension, benign     Current Outpatient Prescriptions on File Prior to Visit  Medication Sig Dispense Refill  . aspirin 81 MG tablet Take 81 mg by mouth daily.    . fluticasone (FLONASE) 50 MCG/ACT nasal spray Place 2 sprays into both nostrils daily. (Patient not taking: Reported on 07/26/2015) 16 g 6  . lisinopril (PRINIVIL,ZESTRIL) 20 MG tablet TAKE 1 TABLET BY MOUTH EVERY DAY 90 tablet 1  . meloxicam (MOBIC) 7.5 MG tablet Take 1 tablet (7.5 mg total) by mouth 2 (two) times daily. 60 tablet 1  . Multiple Vitamin (MULTIVITAMIN) tablet Take 1 tablet by mouth daily.    . Omega-3 Fatty Acids (FISH OIL CONCENTRATE PO) Take by mouth.    Marland Kitchen tiZANidine (ZANAFLEX) 4 MG tablet Take 1 tablet (4 mg total) by mouth at bedtime. 30 tablet 1  . triamcinolone cream (KENALOG) 0.1 % APPLY TO AFFECTED AREA EXTERNALLY 2 TO 3 TIMES A DAY FOR RASH (Patient not taking: Reported on 07/26/2015) 90  g 0  . verapamil (VERELAN PM) 360 MG 24 hr capsule TAKE ONE CAPSULE BY MOUTH ONCE A DAY 90 capsule 1  . vitamin C (ASCORBIC ACID) 500 MG tablet Take 500 mg by mouth daily.    . vitamin E 400 UNIT capsule Take 400 Units by mouth daily.     No current facility-administered medications on file prior to visit.    Allergies  Allergen Reactions  . Atorvastatin     difficulty concentrating and focusing    Objective: Physical Exam  General: Well developed, nourished, no acute distress, awake, alert and oriented x 3  Vascular: Dorsalis pedis artery 2/4 bilateral, Posterior tibial artery 2/4 bilateral, skin temperature warm to warm proximal to distal bilateral lower extremities, no varicosities, pedal hair present bilateral.  Neurological: Gross sensation present via light touch bilateral. Negative tinel and no reproducible nerve related pain.   Dermatological: Skin is warm, dry, and supple bilateral, Nails 1-10 within normal limtits, no webspace macerations present bilateral, no open lesions present bilateral, no callus/corns/hyperkeratotic tissue present bilateral. No signs of infection bilateral.  Musculoskeletal: No pain to palpation. No boney deformities noted bilateral. Muscular strength within normal limits without pain on range of motion. No pain with calf compression bilateral.  Xray Right and Left foot: Enthesopathy, hammer toe, mild decreased osseous mineralization, soft tissues within normal limits.   Assessment and Plan:  Problem List Items Addressed This Visit    None    Visit Diagnoses  Left foot pain    -  Primary    Relevant Orders    DG Foot Complete Left    DG Foot 2 Views Right    Right foot pain        Neuritis        improved    Nail dystrophy        improved       -Examined patient -Xrays reviewed  -Discussed long term care for now resolved symptoms -Advised patient to closely monitor symptoms for recurrence  -Patient to return in as needed for follow  up evaluation or sooner if symptoms worsen.  Landis Martins, DPM

## 2015-08-23 NOTE — Patient Instructions (Signed)
Compression stockings Aspercreme or Icy hot with lidocaine for pain

## 2015-08-26 ENCOUNTER — Other Ambulatory Visit: Payer: Self-pay | Admitting: Family Medicine

## 2015-09-10 ENCOUNTER — Other Ambulatory Visit: Payer: Self-pay

## 2015-09-10 ENCOUNTER — Encounter: Payer: Self-pay | Admitting: Family Medicine

## 2015-09-10 ENCOUNTER — Other Ambulatory Visit: Payer: Self-pay | Admitting: Family Medicine

## 2015-09-10 ENCOUNTER — Ambulatory Visit
Admission: RE | Admit: 2015-09-10 | Discharge: 2015-09-10 | Disposition: A | Discharge status: Home / Self Care | Payer: Medicare Other | Source: Ambulatory Visit | Attending: Family Medicine | Admitting: Family Medicine

## 2015-09-10 ENCOUNTER — Ambulatory Visit (INDEPENDENT_AMBULATORY_CARE_PROVIDER_SITE_OTHER): Payer: Medicare Other | Admitting: Family Medicine

## 2015-09-10 VITALS — BP 122/70 | HR 65 | Temp 98.5°F | Resp 16 | Ht 62.0 in | Wt 172.4 lb

## 2015-09-10 DIAGNOSIS — R938 Abnormal findings on diagnostic imaging of other specified body structures: Secondary | ICD-10-CM | POA: Insufficient documentation

## 2015-09-10 DIAGNOSIS — M79642 Pain in left hand: Secondary | ICD-10-CM | POA: Insufficient documentation

## 2015-09-10 MED ORDER — LISINOPRIL 20 MG PO TABS: 20.0000 mg | ORAL_TABLET | Freq: Every day | ORAL | Status: DC

## 2015-09-10 NOTE — Progress Notes (Signed)
Name: Vickie Stewart   MRN: [id number]    DOB: November 09, 1945   Date:09/10/2015       Progress Note  Subjective  Chief Complaint  Chief Complaint  Patient presents with  . Acute Visit    Check left wrist    HPI  Left Hand and Wrist Pain: Pt. Fell backwards on her outstretched left hand 2 weeks ago at her home. She had intense pain and swelling afterwards. Swelling has gone down, pain is improved but not resolved. She has used ice packs and Ibuprofen for relief.  Wants to make sure there is no underlying fracture or other abnormality.  Past Medical History  Diagnosis Date  . Unspecified disorder of skin and subcutaneous tissue   . Allergic rhinitis, cause unspecified   . Obesity, unspecified   . Leukocytopenia, unspecified   . Contact dermatitis and other eczema, due to unspecified cause   . Reflux esophagitis   . Anxiety state, unspecified   . Thoracic or lumbosacral neuritis or radiculitis, unspecified   . Pure hypercholesterolemia   . Dyspepsia and other specified disorders of function of stomach   . Tietze's disease   . Mastodynia   . Diffuse cystic mastopathy   . Irritable bowel syndrome   . Lateral epicondylitis  of elbow   . Vaginal cancer ([place])     History  . Essential hypertension, benign     Past Surgical History  Procedure Laterality Date  . Breast lumpectomy Left   . Colonoscopy    . Breast excisional biopsy Left     neg    Family History  Problem Relation Age of Onset  . Hypertension Father     Social History   Social History  . Marital Status: Married    Spouse Name: N/A  . Number of Children: N/A  . Years of Education: N/A   Occupational History  . Not on file.   Social History Main Topics  . Smoking status: Never Smoker   . Smokeless tobacco: Not on file  . Alcohol Use: No  . Drug Use: No  . Sexual Activity: Not on file   Other Topics Concern  . Not on file   Social History Narrative     Current outpatient prescriptions:    .  aspirin 81 MG tablet, Take 81 mg by mouth daily., Disp: , Rfl:  .  lisinopril (PRINIVIL,ZESTRIL) 20 MG tablet, TAKE 1 TABLET BY MOUTH EVERY DAY, Disp: 90 tablet, Rfl: 1 .  meloxicam (MOBIC) 7.5 MG tablet, Take 1 tablet (7.5 mg total) by mouth 2 (two) times daily., Disp: 60 tablet, Rfl: 1 .  Multiple Vitamin (MULTIVITAMIN) tablet, Take 1 tablet by mouth daily., Disp: , Rfl:  .  Omega-3 Fatty Acids (FISH OIL CONCENTRATE PO), Take by mouth., Disp: , Rfl:  .  omeprazole (PRILOSEC) 20 MG capsule, TAKE ONE CAPSULE BY MOUTH TWICE A DAY, Disp: 180 capsule, Rfl: 1 .  tiZANidine (ZANAFLEX) 4 MG tablet, Take 1 tablet (4 mg total) by mouth at bedtime., Disp: 30 tablet, Rfl: 1 .  triamcinolone cream (KENALOG) 0.1 %, APPLY TO AFFECTED AREA EXTERNALLY 2 TO 3 TIMES A DAY FOR RASH, Disp: 90 g, Rfl: 0 .  verapamil (VERELAN PM) 360 MG 24 hr capsule, TAKE ONE CAPSULE BY MOUTH ONCE A DAY, Disp: 90 capsule, Rfl: 1 .  vitamin C (ASCORBIC ACID) 500 MG tablet, Take 500 mg by mouth daily., Disp: , Rfl:  .  vitamin E 400 UNIT capsule, Take 400 Units by  mouth daily., Disp: , Rfl:  .  fluticasone (FLONASE) 50 MCG/ACT nasal spray, Place 2 sprays into both nostrils daily. (Patient not taking: Reported on 07/26/2015), Disp: 16 g, Rfl: 6  Allergies  Allergen Reactions  . Atorvastatin     difficulty concentrating and focusing     Review of Systems  Constitutional: Negative for fever and chills.  Musculoskeletal: Positive for joint pain.     Objective  Filed Vitals:   09/10/15 1358  BP: 122/70  Pulse: 65  Temp: 98.5 F (36.9 C)  TempSrc: Oral  Resp: 16  Height: 5\' 2"  (1.575 m)  Weight: 172 lb 6.4 oz (78.2 kg)  SpO2: 96%    Physical Exam  Constitutional: She is well-developed, well-nourished, and in no distress.  Musculoskeletal:       Left hand: She exhibits tenderness. She exhibits no swelling. Normal sensation noted. Normal strength noted.       Hands: Tenderness to palpation over the proximal left  hand dorsal surface, worse with flexion of the hand at wrist. No swelling.  Nursing note and vitals reviewed.    Assessment & Plan  1. Pain in left hand Will obtain x-ray of left hand to rule out fracture. - DG Hand Complete Left; Future    [facility] 09/10/2015 2:04 PM

## 2015-10-10 ENCOUNTER — Encounter: Payer: Self-pay | Admitting: Family Medicine

## 2015-10-10 ENCOUNTER — Ambulatory Visit (INDEPENDENT_AMBULATORY_CARE_PROVIDER_SITE_OTHER): Payer: Medicare Other | Admitting: Family Medicine

## 2015-10-10 VITALS — BP 126/78 | HR 72 | Temp 97.6°F | Resp 16 | Ht 62.0 in | Wt 169.2 lb

## 2015-10-10 DIAGNOSIS — L309 Dermatitis, unspecified: Secondary | ICD-10-CM | POA: Diagnosis not present

## 2015-10-10 DIAGNOSIS — L299 Pruritus, unspecified: Secondary | ICD-10-CM | POA: Diagnosis not present

## 2015-10-10 DIAGNOSIS — R21 Rash and other nonspecific skin eruption: Secondary | ICD-10-CM | POA: Diagnosis not present

## 2015-10-10 MED ORDER — HYDROXYZINE HCL 25 MG PO TABS: 25.0000 mg | ORAL_TABLET | Freq: Every day | ORAL | Status: DC

## 2015-10-10 MED ORDER — PIMECROLIMUS 1 % EX CREA: TOPICAL_CREAM | Freq: Two times a day (BID) | CUTANEOUS | Status: DC

## 2015-10-10 MED ORDER — LORATADINE 10 MG PO TABS: 10.0000 mg | ORAL_TABLET | Freq: Every day | ORAL | Status: DC

## 2015-10-10 NOTE — Progress Notes (Signed)
Name: Vickie Stewart   MRN: [id number]    DOB: 03-19-1946   Date:10/10/2015       Progress Note  Subjective  Chief Complaint  Chief Complaint  Patient presents with  . Rash    patient presents today with a rash on both sides of her face near her ear. she stated that it was itchy and burning. patient has tried some otc medication that did not help. she said that the area is rough. patient stated that the barber used something new when she went last time. she also has started using coconut oil.  . Insect Bite    patient questions an area on the left side of her back.    HPI  Rash: she states she developed a rash on both ears about one month ago. She has history of sensitive skin and breaks out easily. She uses Triamcinolone and it has improved but still very pruriginous and keeps her up at night. She has been to [facility] in the past and was advised to change soaps to mild form, but never seen by an allergist.    Patient Active Problem List   Diagnosis Date Noted  . Pain in left hand 09/10/2015  . Left hand pain 09/10/2015  . Thyroid nodule 07/26/2015  . Osteopenia 07/26/2015  . Menopause 07/26/2015  . Irritable colon 07/26/2015  . Cold sore 07/26/2015  . History of cancer of vagina 07/26/2015  . Diffuse cystic mastopathy 07/26/2015  . Obesity (BMI 30.0-34.9) 07/26/2015  . Intermittent low back pain 07/26/2015  . Hyperlipemia 07/26/2015  . Obesity 05/13/2015  . Osteoarthritis 05/13/2015  . Allergic rhinitis 05/13/2015  . GERD (gastroesophageal reflux disease) 05/13/2015  . Chest pain 05/01/2013  . Essential hypertension, benign     Past Surgical History  Procedure Laterality Date  . Breast lumpectomy Left   . Colonoscopy    . Breast excisional biopsy Left     neg    Family History  Problem Relation Age of Onset  . Hypertension Father     Social History   Social History  . Marital Status: Married    Spouse Name: N/A  . Number of Children: N/A  .  Years of Education: N/A   Occupational History  . Not on file.   Social History Main Topics  . Smoking status: Never Smoker   . Smokeless tobacco: Not on file  . Alcohol Use: No  . Drug Use: No  . Sexual Activity: Not on file   Other Topics Concern  . Not on file   Social History Narrative     Current outpatient prescriptions:  .  lisinopril (PRINIVIL,ZESTRIL) 20 MG tablet, Take 1 tablet (20 mg total) by mouth daily., Disp: 90 tablet, Rfl: 1 .  omeprazole (PRILOSEC) 20 MG capsule, TAKE ONE CAPSULE BY MOUTH TWICE A DAY, Disp: 180 capsule, Rfl: 1 .  triamcinolone cream (KENALOG) 0.1 %, APPLY TO AFFECTED AREA EXTERNALLY 2 TO 3 TIMES A DAY FOR RASH, Disp: 90 g, Rfl: 0 .  verapamil (VERELAN PM) 360 MG 24 hr capsule, TAKE ONE CAPSULE BY MOUTH ONCE A DAY, Disp: 90 capsule, Rfl: 1 .  aspirin 81 MG tablet, Take 81 mg by mouth daily. Reported on 10/10/2015, Disp: , Rfl:  .  fluticasone (FLONASE) 50 MCG/ACT nasal spray, Place 2 sprays into both nostrils daily. (Patient not taking: Reported on 07/26/2015), Disp: 16 g, Rfl: 6 .  hydrOXYzine (ATARAX/VISTARIL) 25 MG tablet, Take 1 tablet (25 mg total) by mouth at  bedtime., Disp: 30 tablet, Rfl: 0 .  loratadine (CLARITIN) 10 MG tablet, Take 1 tablet (10 mg total) by mouth daily., Disp: 30 tablet, Rfl: 0 .  meloxicam (MOBIC) 7.5 MG tablet, Take 1 tablet (7.5 mg total) by mouth 2 (two) times daily. (Patient not taking: Reported on 10/10/2015), Disp: 60 tablet, Rfl: 1 .  Multiple Vitamin (MULTIVITAMIN) tablet, Take 1 tablet by mouth daily. Reported on 10/10/2015, Disp: , Rfl:  .  Omega-3 Fatty Acids (FISH OIL CONCENTRATE PO), Take by mouth. Reported on 10/10/2015, Disp: , Rfl:  .  pimecrolimus (ELIDEL) 1 % cream, Apply topically 2 (two) times daily., Disp: 100 g, Rfl: 0 .  tiZANidine (ZANAFLEX) 4 MG tablet, Take 1 tablet (4 mg total) by mouth at bedtime. (Patient not taking: Reported on 10/10/2015), Disp: 30 tablet, Rfl: 1 .  vitamin C (ASCORBIC ACID) 500 MG  tablet, Take 500 mg by mouth daily. Reported on 10/10/2015, Disp: , Rfl:  .  vitamin E 400 UNIT capsule, Take 400 Units by mouth daily. Reported on 10/10/2015, Disp: , Rfl:   Allergies  Allergen Reactions  . Atorvastatin     difficulty concentrating and focusing     ROS  Ten systems reviewed and is negative except as mentioned in HPI   Objective  Filed Vitals:   10/10/15 0945  BP: 126/78  Pulse: 72  Temp: 97.6 F (36.4 C)  TempSrc: Oral  Resp: 16  Height: 5\' 2"  (1.575 m)  Weight: 169 lb 3.2 oz (76.749 kg)  SpO2: 97%    Body mass index is 30.94 kg/(m^2).  Physical Exam  Constitutional: Patient appears well-developed and well-nourished. Obese  No distress.  HEENT: head atraumatic, normocephalic, pupils equal and reactive to light, ears TM neck supple, throat within normal limits Cardiovascular: Normal rate, regular rhythm and normal heart sounds.  No murmur heard. No BLE edema. Pulmonary/Chest: Effort normal and breath sounds normal. No respiratory distress. Abdominal: Soft.  There is no tenderness. Psychiatric: Patient has a normal mood and affect. behavior is normal. Judgment and thought content normal. Skin: she has erythematous papule rash around both ears, worse on left side, no oozing.   Recent Results (from the past 2160 hour(s))  Lipid panel     Status: Abnormal   Collection Time: 07/26/15 10:03 AM  Result Value Ref Range   Cholesterol, Total 216 (H) 100 - 199 mg/dL   Triglycerides 108 0 - 149 mg/dL   HDL 54 >39 mg/dL   VLDL Cholesterol Cal 22 5 - 40 mg/dL   LDL Calculated 140 (H) 0 - 99 mg/dL   Chol/HDL Ratio 4.0 0.0 - 4.4 ratio units    Comment:                                   T. Chol/HDL Ratio                                             Men  Women                               1/2 Avg.Risk  3.4    3.3  Avg.Risk  5.0    4.4                                2X Avg.Risk  9.6    7.1                                3X Avg.Risk  23.4   11.0   Calcium     Status: None   Collection Time: 07/26/15 10:03 AM  Result Value Ref Range   Calcium 10.2 8.7 - 10.3 mg/dL  Glucose     Status: None   Collection Time: 07/26/15 10:03 AM  Result Value Ref Range   Glucose 87 65 - 99 mg/dL     PHQ2/9: Depression screen [facility] 2/9 10/10/2015 09/10/2015 07/26/2015 05/13/2015 02/14/2015  Decreased Interest 0 0 0 0 0  Down, Depressed, Hopeless 0 0 0 0 0  PHQ - 2 Score 0 0 0 0 0    Fall Risk: Fall Risk  10/10/2015 09/10/2015 07/26/2015 05/13/2015 02/14/2015  Falls in the past year? No No No No No    Functional Status Survey: Is the patient deaf or have difficulty hearing?: No Does the patient have difficulty seeing, even when wearing glasses/contacts?: Yes (glasses) Does the patient have difficulty concentrating, remembering, or making decisions?: No Does the patient have difficulty walking or climbing stairs?: No Does the patient have difficulty dressing or bathing?: No Does the patient have difficulty doing errands alone such as visiting a doctor's office or shopping?: No   Assessment & Plan  1. Rash  Likely eczema, we will try Elidel  If no improvement we will refer her back to [facility]  2. Pruritus  - hydrOXYzine (ATARAX/VISTARIL) 25 MG tablet; Take 1 tablet (25 mg total) by mouth at bedtime.  Dispense: 30 tablet; Refill: 0 - loratadine (CLARITIN) 10 MG tablet; Take 1 tablet (10 mg total) by mouth daily.  Dispense: 30 tablet; Refill: 0  3. Eczema  - pimecrolimus (ELIDEL) 1 % cream; Apply topically 2 (two) times daily.  Dispense: 100 g; Refill: 0

## 2015-10-24 ENCOUNTER — Telehealth: Payer: Self-pay | Admitting: Family Medicine

## 2015-10-24 ENCOUNTER — Other Ambulatory Visit: Payer: Self-pay

## 2015-10-24 ENCOUNTER — Other Ambulatory Visit: Payer: Self-pay | Admitting: Family Medicine

## 2015-10-24 DIAGNOSIS — R21 Rash and other nonspecific skin eruption: Secondary | ICD-10-CM

## 2015-10-24 DIAGNOSIS — L309 Dermatitis, unspecified: Secondary | ICD-10-CM

## 2015-10-24 NOTE — Telephone Encounter (Signed)
Got a fax from [facility] requesting a new rx for this patient's Elidel cream 1%.  Refill request was sent to Dr. Steele Sizer for approval and submission.

## 2015-10-24 NOTE — Telephone Encounter (Signed)
Patient insurance will not cover the Pimecrolimus. The pharmacy is suppose to be sending you over something pertaining to this. The rash is still around her ear. Patient also stated that you had mentioned a referral if it did not get any better however she has not had any medication to help it. She is taking the two pills but not any cream. Please advise. Patient uses [facility]

## 2015-10-27 MED ORDER — PIMECROLIMUS 1 % EX CREA: TOPICAL_CREAM | Freq: Two times a day (BID) | CUTANEOUS | Status: DC

## 2015-10-28 ENCOUNTER — Other Ambulatory Visit: Payer: Self-pay | Admitting: Family Medicine

## 2015-10-30 ENCOUNTER — Telehealth: Payer: Self-pay

## 2015-10-30 DIAGNOSIS — L309 Dermatitis, unspecified: Secondary | ICD-10-CM

## 2015-10-30 NOTE — Telephone Encounter (Signed)
Coverage was denied for Vickie Stewart since it was prescribed for Pruritus.  Please advise.

## 2015-10-31 DIAGNOSIS — L309 Dermatitis, unspecified: Secondary | ICD-10-CM | POA: Insufficient documentation

## 2015-10-31 NOTE — Telephone Encounter (Signed)
She has eczema

## 2015-11-06 ENCOUNTER — Other Ambulatory Visit: Payer: Self-pay | Admitting: Family Medicine

## 2015-11-06 ENCOUNTER — Telehealth: Payer: Self-pay | Admitting: Family Medicine

## 2015-11-06 NOTE — Telephone Encounter (Signed)
Pt states she needs refill on Tramcinolone Acetonide Cream to called into [facility]. This is for her Ezema.

## 2015-11-08 MED ORDER — TRIAMCINOLONE ACETONIDE 0.1 % EX CREA: TOPICAL_CREAM | CUTANEOUS | Status: DC

## 2015-11-08 NOTE — Telephone Encounter (Signed)
Called pharmacy to see what has been filled in the past and also called pt to let her know what was sent but she did not answer

## 2015-11-08 NOTE — Telephone Encounter (Signed)
This was sent to [facility] and [facility] both but I sent what was in her chart previously to her pharmacy.

## 2015-11-08 NOTE — Telephone Encounter (Signed)
Patient says the wrong prescription has been called in. She is needing tramcinolone acetonide cream. She is at the pharmacy waiting, states she has been there 4 times checking on the prescription.

## 2016-01-23 ENCOUNTER — Encounter: Payer: Self-pay | Admitting: Family Medicine

## 2016-01-23 ENCOUNTER — Ambulatory Visit (INDEPENDENT_AMBULATORY_CARE_PROVIDER_SITE_OTHER): Payer: Medicare Other | Admitting: Family Medicine

## 2016-01-23 ENCOUNTER — Ambulatory Visit: Payer: Medicare Other | Admitting: Family Medicine

## 2016-01-23 VITALS — BP 138/70 | HR 64 | Temp 97.6°F | Resp 20 | Ht 62.0 in | Wt 173.4 lb

## 2016-01-23 DIAGNOSIS — M150 Primary generalized (osteo)arthritis: Secondary | ICD-10-CM

## 2016-01-23 DIAGNOSIS — M159 Polyosteoarthritis, unspecified: Secondary | ICD-10-CM

## 2016-01-23 DIAGNOSIS — R350 Frequency of micturition: Secondary | ICD-10-CM

## 2016-01-23 DIAGNOSIS — Z131 Encounter for screening for diabetes mellitus: Secondary | ICD-10-CM | POA: Diagnosis not present

## 2016-01-23 DIAGNOSIS — Z8544 Personal history of malignant neoplasm of other female genital organs: Secondary | ICD-10-CM | POA: Diagnosis not present

## 2016-01-23 DIAGNOSIS — R21 Rash and other nonspecific skin eruption: Secondary | ICD-10-CM

## 2016-01-23 DIAGNOSIS — K219 Gastro-esophageal reflux disease without esophagitis: Secondary | ICD-10-CM | POA: Diagnosis not present

## 2016-01-23 DIAGNOSIS — I10 Essential (primary) hypertension: Secondary | ICD-10-CM

## 2016-01-23 DIAGNOSIS — M545 Low back pain, unspecified: Secondary | ICD-10-CM

## 2016-01-23 DIAGNOSIS — E785 Hyperlipidemia, unspecified: Secondary | ICD-10-CM

## 2016-01-23 MED ORDER — VERAPAMIL HCL ER 360 MG PO CP24: ORAL_CAPSULE | ORAL | 1 refills | Status: DC

## 2016-01-23 MED ORDER — ROSUVASTATIN CALCIUM 10 MG PO TABS: 10.0000 mg | ORAL_TABLET | Freq: Every day | ORAL | 1 refills | Status: DC

## 2016-01-23 MED ORDER — OMEPRAZOLE 20 MG PO CPDR: 20.0000 mg | DELAYED_RELEASE_CAPSULE | Freq: Every day | ORAL | 0 refills | Status: DC

## 2016-01-23 MED ORDER — LISINOPRIL 20 MG PO TABS: 20.0000 mg | ORAL_TABLET | Freq: Every day | ORAL | 1 refills | Status: DC

## 2016-01-23 NOTE — Progress Notes (Signed)
Name: Vickie Stewart   MRN: [id number]    DOB: 1945/06/04   Date:01/23/2016       Progress Note  Subjective  Chief Complaint  Chief Complaint  Patient presents with  . Hypertension    6 month follow up    HPI  HTN: she is compliant with her medication, bp has been at goal, no chest pain or palpitation  Intermittent Low back : she works in [employer], and has to lift heavy objects, she has been taking Meloxicam and Tizanidine prn only. She described as sharp, intermittent , no radiculitis.   Rash : seen by Dermatologist and still has some problems, advised to go back, may need to see allergist if no improvement  Hyperlipidemia: Based on the results of lipid panel his cardiovascular risk factor ( using [facility] )  in the next 10 years is : 13.2% and explained importance of resuming statin therapy   GERD: she has been taking Omeprazole daily, discussed risk associated with long term use of PPI and she will try to wean herself off   Obesity: she has started [employer], but once she goes back to work on Monday she will not be able to atted the classed - only offered in am's. She is trying to eat better.   Urinary frequency: she states over the past few months she has noticed some stress incontinence, and also urinary frequency and nocturia. She drinks a lot of water, and feels thirsty. We will check urine culture and check for DM  Vaginal lesion: she has a history of vaginal cancer and has felt a bump in her vagina, no vaginal bleeding, we will refer her back to GYN   Patient Active Problem List   Diagnosis Date Noted  . Eczema 10/31/2015  . Pain in left hand 09/10/2015  . Left hand pain 09/10/2015  . Thyroid nodule 07/26/2015  . Osteopenia 07/26/2015  . Menopause 07/26/2015  . Irritable colon 07/26/2015  . History of cancer of vagina 07/26/2015  . Diffuse cystic mastopathy 07/26/2015  . Obesity (BMI 30.0-34.9) 07/26/2015  . Intermittent low back pain  07/26/2015  . Hyperlipemia 07/26/2015  . Obesity 05/13/2015  . Osteoarthritis 05/13/2015  . Allergic rhinitis 05/13/2015  . GERD (gastroesophageal reflux disease) 05/13/2015  . Chest pain 05/01/2013  . Essential hypertension, benign     Past Surgical History:  Procedure Laterality Date  . BREAST EXCISIONAL BIOPSY Left    neg  . BREAST LUMPECTOMY Left   . COLONOSCOPY      Family History  Problem Relation Age of Onset  . Hypertension Father     Social History   Social History  . Marital status: Married    Spouse name: N/A  . Number of children: N/A  . Years of education: N/A   Occupational History  . Not on file.   Social History Main Topics  . Smoking status: Never Smoker  . Smokeless tobacco: Not on file  . Alcohol use No  . Drug use: No  . Sexual activity: Not on file   Other Topics Concern  . Not on file   Social History Narrative  . No narrative on file     Current Outpatient Prescriptions:  .  aspirin 81 MG tablet, Take 81 mg by mouth daily. Reported on 10/10/2015, Disp: , Rfl:  .  fluticasone (FLONASE) 50 MCG/ACT nasal spray, Place 2 sprays into both nostrils daily. (Patient not taking: Reported on 07/26/2015), Disp: 16 g, Rfl: 6 .  lisinopril (PRINIVIL,ZESTRIL) 20 MG tablet, Take 1 tablet (20 mg total) by mouth daily., Disp: 90 tablet, Rfl: 1 .  loratadine (CLARITIN) 10 MG tablet, TAKE 1 TABLET (10 MG TOTAL) BY MOUTH DAILY., Disp: 30 tablet, Rfl: 1 .  meloxicam (MOBIC) 7.5 MG tablet, Take 1 tablet (7.5 mg total) by mouth 2 (two) times daily. (Patient not taking: Reported on 10/10/2015), Disp: 60 tablet, Rfl: 1 .  Multiple Vitamin (MULTIVITAMIN) tablet, Take 1 tablet by mouth daily. Reported on 10/10/2015, Disp: , Rfl:  .  Omega-3 Fatty Acids (FISH OIL CONCENTRATE PO), Take by mouth. Reported on 10/10/2015, Disp: , Rfl:  .  omeprazole (PRILOSEC) 20 MG capsule, Take 1 capsule (20 mg total) by mouth daily., Disp: 90 capsule, Rfl: 0 .  pimecrolimus (ELIDEL) 1 %  cream, Apply topically 2 (two) times daily., Disp: 100 g, Rfl: 0 .  tiZANidine (ZANAFLEX) 4 MG tablet, Take 1 tablet (4 mg total) by mouth at bedtime. (Patient not taking: Reported on 10/10/2015), Disp: 30 tablet, Rfl: 1 .  triamcinolone cream (KENALOG) 0.1 %, APPLY TO AFFECTED AREA EXTERNALLY 2 TO 3 TIMES A DAY FOR RASH, Disp: 90 g, Rfl: 0 .  verapamil (VERELAN PM) 360 MG 24 hr capsule, TAKE ONE CAPSULE BY MOUTH ONCE A DAY, Disp: 90 capsule, Rfl: 1 .  vitamin C (ASCORBIC ACID) 500 MG tablet, Take 500 mg by mouth daily. Reported on 10/10/2015, Disp: , Rfl:  .  vitamin E 400 UNIT capsule, Take 400 Units by mouth daily. Reported on 10/10/2015, Disp: , Rfl:   Allergies  Allergen Reactions  . Atorvastatin     difficulty concentrating and focusing  . Terbinafine And Related Dermatitis    Rash, itch, skin discoloration     ROS  Constitutional: Negative for fever, positive for  weight change.  Respiratory: Negative for cough and shortness of breath.   Cardiovascular: Negative for chest pain or palpitations.  Gastrointestinal: Negative for abdominal pain, no bowel changes.  Musculoskeletal: Negative for gait problem or joint swelling.  Skin: Positive  for rash.  Neurological: Negative for dizziness or headache.  No other specific complaints in a complete review of systems (except as listed in HPI above).  Objective  Vitals:   01/23/16 1008  BP: 138/70  Pulse: 64  Resp: 20  Temp: 97.6 F (36.4 C)  SpO2: 96%  Weight: 173 lb 6 oz (78.6 kg)  Height: 5\' 2"  (1.575 m)    Body mass index is 31.71 kg/m.  Physical Exam  Constitutional: Patient appears well-developed and well-nourished. Obese No distress.  HEENT: head atraumatic, normocephalic, pupils equal and reactive to light,  neck supple, throat within normal limits Cardiovascular: Normal rate, regular rhythm and normal heart sounds.  No murmur heard. No BLE edema. Pulmonary/Chest: Effort normal and breath sounds normal. No  respiratory distress. Abdominal: Soft.  There is no tenderness. Psychiatric: Patient has a normal mood and affect. behavior is normal. Judgment and thought content normal. Skin: still has some erythematous patches behind right ear, also on anterior chest   PHQ2/9: Depression screen [facility] 2/9 01/23/2016 10/10/2015 09/10/2015 07/26/2015 05/13/2015  Decreased Interest 0 0 0 0 0  Down, Depressed, Hopeless 0 0 0 0 0  PHQ - 2 Score 0 0 0 0 0    Fall Risk: Fall Risk  01/23/2016 10/10/2015 09/10/2015 07/26/2015 05/13/2015  Falls in the past year? No No No No No     Functional Status Survey: Is the patient deaf or have difficulty hearing?: No Does the  patient have difficulty seeing, even when wearing glasses/contacts?: No Does the patient have difficulty concentrating, remembering, or making decisions?: No Does the patient have difficulty walking or climbing stairs?: No Does the patient have difficulty dressing or bathing?: No Does the patient have difficulty doing errands alone such as visiting a doctor's office or shopping?: No    Assessment & Plan  1. Essential hypertension, benign  - lisinopril (PRINIVIL,ZESTRIL) 20 MG tablet; Take 1 tablet (20 mg total) by mouth daily.  Dispense: 90 tablet; Refill: 1 - verapamil (VERELAN PM) 360 MG 24 hr capsule; TAKE ONE CAPSULE BY MOUTH ONCE A DAY  Dispense: 90 capsule; Refill: 1 - COMPLETE METABOLIC PANEL WITH GFR - CBC with Differential/Platelet  2. Intermittent low back pain  Taking medication prn, Meloxicam  3. Hyperlipemia  She was on Lipitor and states affected her memory and she stopped, we will try Crestor - rosuvastatin (CRESTOR) 10 MG tablet; Take 1 tablet (10 mg total) by mouth daily.  Dispense: 90 tablet; Refill: 1 - Lipid panel  4. Gastroesophageal reflux disease without esophagitis  - omeprazole (PRILOSEC) 20 MG capsule; Take 1 capsule (20 mg total) by mouth daily.  Dispense: 90 capsule; Refill: 0 She will try to wean self  alternating with Zantac  5. Primary osteoarthritis involving multiple joints  Taking Meloxicam prn   6. History of cancer of vagina  - Ambulatory referral to Gynecology  7. Rash  Seen at [facility], took Lamisil for one month and had a reaction, not sure of diagnosis since, she has been off medication, we will get records  8. Diabetes mellitus screening  - Hemoglobin A1c  9. Urinary frequency  - Urine Culture

## 2016-01-24 ENCOUNTER — Other Ambulatory Visit: Payer: Self-pay | Admitting: Family Medicine

## 2016-01-24 LAB — URINE CULTURE: ORGANISM ID, BACTERIA: NO GROWTH

## 2016-02-01 ENCOUNTER — Other Ambulatory Visit: Payer: Self-pay | Admitting: Family Medicine

## 2016-02-11 ENCOUNTER — Telehealth: Payer: Self-pay

## 2016-02-11 NOTE — Telephone Encounter (Signed)
I can order a calcium level, not likely the cause of elevation of calcium levels

## 2016-02-11 NOTE — Telephone Encounter (Signed)
Patient states since she started taking the Crestor, her lips are becoming more and more chapped and her face is drying out. Patient feels like this medication is giving her a elevated calcium level and that is why she is having these symptoms. Could that be the cause? Patient has had high calcium before and had the same symptoms.

## 2016-02-11 NOTE — Telephone Encounter (Signed)
Patient does want her calcium check and informed her this was part of the lab work I'm mailing her to have done.

## 2016-03-18 ENCOUNTER — Encounter: Payer: Self-pay | Admitting: Obstetrics and Gynecology

## 2016-03-18 ENCOUNTER — Ambulatory Visit (INDEPENDENT_AMBULATORY_CARE_PROVIDER_SITE_OTHER): Payer: Medicare Other | Admitting: Obstetrics and Gynecology

## 2016-03-18 VITALS — BP 125/75 | HR 53 | Ht 62.0 in | Wt 173.6 lb

## 2016-03-18 DIAGNOSIS — N952 Postmenopausal atrophic vaginitis: Secondary | ICD-10-CM | POA: Insufficient documentation

## 2016-03-18 DIAGNOSIS — Z8544 Personal history of malignant neoplasm of other female genital organs: Secondary | ICD-10-CM

## 2016-03-18 DIAGNOSIS — Z08 Encounter for follow-up examination after completed treatment for malignant neoplasm: Secondary | ICD-10-CM | POA: Diagnosis not present

## 2016-03-18 DIAGNOSIS — R350 Frequency of micturition: Secondary | ICD-10-CM | POA: Diagnosis not present

## 2016-03-18 DIAGNOSIS — C52 Malignant neoplasm of vagina: Secondary | ICD-10-CM | POA: Insufficient documentation

## 2016-03-18 DIAGNOSIS — N898 Other specified noninflammatory disorders of vagina: Secondary | ICD-10-CM | POA: Diagnosis not present

## 2016-03-18 LAB — POCT URINALYSIS DIPSTICK
BILIRUBIN UA: NEGATIVE
Blood, UA: NEGATIVE
Glucose, UA: NEGATIVE
KETONES UA: NEGATIVE
LEUKOCYTES UA: NEGATIVE
NITRITE UA: NEGATIVE
PH UA: 6
Protein, UA: NEGATIVE
Spec Grav, UA: 1.01
Urobilinogen, UA: NEGATIVE

## 2016-03-18 NOTE — Progress Notes (Signed)
GYN ENCOUNTER NOTE  Subjective:       Vickie Stewart is a 70 y.o. G84P0100 female is here for gynecologic evaluation of the following issues:  1. Vaginal nodule 2. History of vaginal cancer  Menopausal female with history of vaginal cancer 20 years ago: Status post radiation therapy; long-term surveillance negative for recurrent disease. All Pap smears since treatment have been normal.  Patient notes small nodular bump at the posterior fourchette just inside the introitus. No pelvic pain, vaginal bleeding.   Patient is not sexually active. Gynecologic History No LMP recorded. Patient is postmenopausal. Contraception: post menopausal status Last Pap: Normal 05/13/2015  Obstetric History OB History  Gravida Para Term Preterm AB Living  1 1   1       SAB TAB Ectopic Multiple Live Births          1    # Outcome Date GA Lbr Len/2nd Weight Sex Delivery Anes PTL Lv  1 Preterm 1977    F    DEC      Past Medical History:  Diagnosis Date  . Allergic rhinitis, cause unspecified   . Anxiety state, unspecified   . Contact dermatitis and other eczema, due to unspecified cause   . Diffuse cystic mastopathy   . Dyspepsia and other specified disorders of function of stomach   . Essential hypertension, benign   . Glaucoma   . Irritable bowel syndrome   . Lateral epicondylitis  of elbow   . Leukocytopenia, unspecified   . Mastodynia   . Obesity, unspecified   . Pure hypercholesterolemia   . Reflux esophagitis   . Thoracic or lumbosacral neuritis or radiculitis, unspecified   . Tietze's disease   . Unspecified disorder of skin and subcutaneous tissue   . Vaginal cancer [facility])    History    Past Surgical History:  Procedure Laterality Date  . BREAST EXCISIONAL BIOPSY Left    neg  . BREAST LUMPECTOMY Left   . COLONOSCOPY      Current Outpatient Prescriptions on File Prior to Visit  Medication Sig Dispense Refill  . aspirin 81 MG tablet Take 81 mg by mouth daily. Reported  on 10/10/2015    . fluticasone (FLONASE) 50 MCG/ACT nasal spray Place 2 sprays into both nostrils daily. (Patient not taking: Reported on 07/26/2015) 16 g 6  . lisinopril (PRINIVIL,ZESTRIL) 20 MG tablet Take 1 tablet (20 mg total) by mouth daily. 90 tablet 1  . loratadine (CLARITIN) 10 MG tablet TAKE 1 TABLET (10 MG TOTAL) BY MOUTH DAILY. 30 tablet 1  . meloxicam (MOBIC) 7.5 MG tablet Take 1 tablet (7.5 mg total) by mouth 2 (two) times daily. (Patient not taking: Reported on 10/10/2015) 60 tablet 1  . Multiple Vitamin (MULTIVITAMIN) tablet Take 1 tablet by mouth daily. Reported on 10/10/2015    . Omega-3 Fatty Acids (FISH OIL CONCENTRATE PO) Take by mouth. Reported on 10/10/2015    . omeprazole (PRILOSEC) 20 MG capsule Take 1 capsule (20 mg total) by mouth daily. 90 capsule 0  . pimecrolimus (ELIDEL) 1 % cream Apply topically 2 (two) times daily. 100 g 0  . rosuvastatin (CRESTOR) 10 MG tablet Take 1 tablet (10 mg total) by mouth daily. 90 tablet 1  . tiZANidine (ZANAFLEX) 4 MG tablet Take 1 tablet (4 mg total) by mouth at bedtime. (Patient not taking: Reported on 10/10/2015) 30 tablet 1  . triamcinolone cream (KENALOG) 0.1 % APPLY TO AFFECTED AREA EXTERNALLY 2 TO 3 TIMES A DAY FOR RASH  90 g 0  . verapamil (VERELAN PM) 360 MG 24 hr capsule TAKE ONE CAPSULE BY MOUTH ONCE A DAY 90 capsule 1  . vitamin C (ASCORBIC ACID) 500 MG tablet Take 500 mg by mouth daily. Reported on 10/10/2015    . vitamin E 400 UNIT capsule Take 400 Units by mouth daily. Reported on 10/10/2015     No current facility-administered medications on file prior to visit.     Allergies  Allergen Reactions  . Atorvastatin     difficulty concentrating and focusing  . Terbinafine And Related Dermatitis    Rash, itch, skin discoloration    Social History   Social History  . Marital status: Married    Spouse name: N/A  . Number of children: N/A  . Years of education: N/A   Occupational History  . Not on file.   Social History  Main Topics  . Smoking status: Never Smoker  . Smokeless tobacco: Never Used  . Alcohol use No  . Drug use: No  . Sexual activity: Not Currently   Other Topics Concern  . Not on file   Social History Narrative  . No narrative on file    Family History  Problem Relation Age of Onset  . Hypertension Father     The following portions of the patient's history were reviewed and updated as appropriate: allergies, current medications, past family history, past medical history, past social history, past surgical history and problem list.  Review of Systems Review of Systems -per history of present illness Objective:   BP 125/75   Pulse (!) 53   Ht 5\' 2"  (1.575 m)   Wt 173 lb 9.6 oz (78.7 kg)   BMI 31.75 kg/m  CONSTITUTIONAL: Well-developed, well-nourished female in no acute distress.  HENT:  Normocephalic, atraumatic.  NECK: Not examined SKIN: Skin is warm and dry. No rash noted. Not diaphoretic. No erythema. No pallor. [place]: Alert and oriented to person, place, and time. PSYCHIATRIC: Normal mood and affect. Normal behavior. Normal judgment and thought content. CARDIOVASCULAR:Not Examined RESPIRATORY: Not Examined BREASTS: Not Examined ABDOMEN: Soft, non distended; Non tender.  No Organomegaly. PELVIC: No inguinal adenopathy  External Genitalia: Atrophic changes  BUS: Normal  Vagina: Moderate to severe atrophy; 5 mm area of scarring with punctate lesions noted at the posterior fourchette just inside the introitus  Cervix: Normal; stenotic  Uterus: Normal size, shape,consistency, mobile  Adnexa: Normal  RV: Normal external exam  Bladder: Nontender MUSCULOSKELETAL: Normal range of motion. No tenderness.  No cyanosis, clubbing, or edema.   PROCEDURE: Perineal biopsy Indications: History of vaginal cancer; vaginal nodule newly identified Findings: 5 mm punctate area of scarring at the distal vagina just inside the introitus at the posterior fourchette. Biopsy:  Posterior fourchette 4 mm punch Description: Patient is placed in dorsal lithotomy position. She is prepped with Betadine swabs. 2-1/2 cc of 1% lidocaine without epinephrine R injected for anesthetic purposes. 4 mm punch biopsy is taken and sent to pathology. 2 4-0 Vicryl repeat sutures are placed for hemostasis. Blood loss is minimal. Procedure was well-tolerated. Complications-none. Post procedure precautions are given.     Assessment:   1. Urinary frequency - POCT Urinalysis Dipstick  2. Vaginal cancer (HCC),History of (20 years ago)  3. Encounter for follow-up surveillance of vaginal cancer  4. Vaginal lesion, 5 mm nodule with scarring  5. Vaginal atrophy     Plan:   1. 4 mm punch biopsy of distal vagina at posterior fourchette 2. Return in 1  week for follow-up 3. Post procedure precautions given  Brayton Mars, MD  Note: This dictation was prepared with Dragon dictation along with smaller phrase technology. Any transcriptional errors that result from this process are unintentional.

## 2016-03-18 NOTE — Addendum Note (Signed)
Addended by: Earl Lagos on: 03/18/2016 02:35 PM   Modules accepted: Orders

## 2016-03-18 NOTE — Patient Instructions (Signed)
VULVAR BIOPSY POST-PROCEDURE INSTRUCTIONS  1. You may take Ibuprofen, Aleve or Tylenol for pain if needed.    2. You may have a small amount of spotting.  You should wear a mini pad for the next few days.  3. You may use some topical Neosporin ointment if you would like (over the counter is fine).  4. You need to call if you have redness around the biopsy site, if there is any unusual draining, if the bleeding is heavy, or if you are concerned.  5. Shower or bathe as normal  6. We we will discuss the results at your follow-up appointment.

## 2016-03-18 NOTE — Addendum Note (Signed)
Addended by: Earl Lagos on: 03/18/2016 11:55 AM   Modules accepted: Orders

## 2016-03-24 ENCOUNTER — Encounter: Payer: Self-pay | Admitting: Obstetrics and Gynecology

## 2016-03-24 ENCOUNTER — Ambulatory Visit (INDEPENDENT_AMBULATORY_CARE_PROVIDER_SITE_OTHER): Payer: Medicare Other | Admitting: Obstetrics and Gynecology

## 2016-03-24 VITALS — BP 117/69 | HR 60 | Ht 62.0 in | Wt 174.0 lb

## 2016-03-24 DIAGNOSIS — N952 Postmenopausal atrophic vaginitis: Secondary | ICD-10-CM

## 2016-03-24 DIAGNOSIS — N898 Other specified noninflammatory disorders of vagina: Secondary | ICD-10-CM | POA: Diagnosis not present

## 2016-03-24 DIAGNOSIS — L900 Lichen sclerosus et atrophicus: Secondary | ICD-10-CM | POA: Diagnosis not present

## 2016-03-24 LAB — PAP IG AND HPV HIGH-RISK: PAP Smear Comment: 0

## 2016-03-24 LAB — PATHOLOGY

## 2016-03-24 LAB — HPV, LOW VOLUME (REFLEX): HPV, LOW VOL REFLEX: NEGATIVE

## 2016-03-24 MED ORDER — CLOBETASOL PROPIONATE 0.05 % EX OINT: 1.0000 "application " | TOPICAL_OINTMENT | Freq: Two times a day (BID) | CUTANEOUS | 3 refills | Status: DC

## 2016-03-24 NOTE — Patient Instructions (Signed)
1. Apply Temovate ointment 0.05% topically to the vulva twice a day for 2 weeks, then once a day for 4 weeks 2. Return in 6 weeks for follow-up   Lichen Sclerosus Lichen sclerosus is a skin problem. It can happen on any part of the body, but it commonly involves the anal or genital areas. It can cause itching and discomfort in these areas. Treatment can help to control symptoms. When the genital area is affected, getting treatment is important because the condition can cause scarring that may lead to other problems. CAUSES The cause of this condition is not known. It could be the result of an overactive immune system or a lack of certain hormones. Lichen sclerosus is not an infection or a fungus. It is not passed from one person to another (not contagious). RISK FACTORS This condition is more likely to develop in women, usually after menopause. SYMPTOMS Symptoms of this condition include:  Thin, wrinkled, white areas on the skin.  Thickened white areas on the skin.  Red and swollen patches (lesions) on the skin.  Tears or cracks in the skin.  Bruising.  Blood blisters.  Severe itching. You may also have pain, itching, or burning with urination. Constipation is also common in people with lichen sclerosus. DIAGNOSIS This condition may be diagnosed with a physical exam. In some cases, a tissue sample (biopsy sample) may be removed to be looked at under a microscope. TREATMENT This condition is usually treated with medicated creams or ointments (topical steroids) that are applied over the affected areas. HOME CARE INSTRUCTIONS  Take over-the-counter and prescription medicines only as told by your health care provider.  Use creams or ointments as told by your health care provider.  Do not scratch the affected areas of skin.  Women should keep the vaginal area as clean and dry as possible.  Keep all follow-up visits as told by your health care provider. This is important. SEEK  MEDICAL CARE IF:  You have increasing redness, swelling, or pain in the affected area.  You have fluid, blood, or pus coming from the affected area.  You have new lesions on your skin.  You have pain or burning with urination.  You have pain during sex.   This information is not intended to replace advice given to you by your health care provider. Make sure you discuss any questions you have with your health care provider.   Document Released: 10/08/2010 Document Revised: 02/06/2015 Document Reviewed: 08/13/2014 Elsevier Interactive Patient Education [employer].

## 2016-03-24 NOTE — Progress Notes (Signed)
Chief complaint: 1. Vaginal nodule  Patient presents for follow-up on vulvar biopsy for evaluation of nodule identified on self-exam. Distal scarring at the posterior fourchette/introitus was biopsied.  Pathology: Lichen sclerosus  OBJECTIVE: BP 117/69   Pulse 60   Ht 5\' 2"  (1.575 m)   Wt 174 lb (78.9 kg)   BMI 31.83 kg/m  Physical exam: Pelvic: External genitalia-biopsy site at posterior fourchette is healing well; sutures intact  ASSESSMENT: 1. Lichen sclerosus 2. History of vaginal cancer status post radiation therapy 3. Normal Pap smear/HPV negative  PLAN: 1. Temovate ointment 0.05% topically to bulbar twice a day for 2 weeks, then once a day for 4 weeks 2. Return in 6 weeks for follow-up  A total of 15 minutes were spent face-to-face with the patient during this encounter and over half of that time dealt with counseling and coordination of care.  Brayton Mars, MD

## 2016-05-05 ENCOUNTER — Ambulatory Visit (INDEPENDENT_AMBULATORY_CARE_PROVIDER_SITE_OTHER): Payer: Medicare Other | Admitting: Obstetrics and Gynecology

## 2016-05-05 ENCOUNTER — Encounter: Payer: Self-pay | Admitting: Obstetrics and Gynecology

## 2016-05-05 VITALS — BP 121/75 | HR 64 | Ht 62.0 in | Wt 172.7 lb

## 2016-05-05 DIAGNOSIS — N898 Other specified noninflammatory disorders of vagina: Secondary | ICD-10-CM | POA: Diagnosis not present

## 2016-05-05 DIAGNOSIS — L900 Lichen sclerosus et atrophicus: Secondary | ICD-10-CM

## 2016-05-05 DIAGNOSIS — C52 Malignant neoplasm of vagina: Secondary | ICD-10-CM | POA: Diagnosis not present

## 2016-05-05 NOTE — Progress Notes (Signed)
Chief complaint: 1. Lichen sclerosus  Patient presents for follow-up on lichen sclerosus. Vulvar biopsy 6 weeks ago of a vulvar nodule returned with a lichen sclerosus diagnosis. She has been asymptomatic without vulvar itching burning or pain. She has since used Temovate ointment 0.05% topically twice a day for 2 weeks, followed by once a day for 4 weeks. She remains asymptomatic at this time.  Patient has remote history of vaginal cancer.  OBJECTIVE: BP 121/75   Pulse 64   Ht 5\' 2"  (1.575 m)   Wt 172 lb 11.2 oz (78.3 kg)   BMI 31.59 kg/m  PELVIC: No inguinal adenopathy             External Genitalia: Atrophic changes             BUS: Normal             Vagina: Moderate to severe atrophy; previously noted 5 mm area of scarring with punctate lesions at the posterior fourchette just inside the introitus are RESOLVED             Cervix: Not examined             Uterus: Not examined             Adnexa: Not examined             RV: Normal external exam             Bladder: Nontender   ASSESSMENT: 1. History of vaginal cancer 2. History of newly identified nodule at posterior fourchette, excised and pathology consistent with lichen sclerosus. Normal exam today 3. Status post 6 weeks of Temovate ointment therapy  PLAN: 1. Discontinue Temovate ointment therapy 2. Follow-up as needed if nodules recur or if itching or burning of perineum develops 3. Maintain routine follow-up with Dr. Ancil Boozer  A total of 15 minutes were spent face-to-face with the patient during this encounter and over half of that time dealt with counseling and coordination of care.   Brayton Mars, MD  Note: This dictation was prepared with Dragon dictation along with smaller phrase technology. Any transcriptional errors that result from this process are unintentional.

## 2016-05-05 NOTE — Patient Instructions (Addendum)
1. Stop Temovate ointment at this time 2. Return if recurrent vulvar lesions develop 3. Maintain routine follow-up with Dr. Ancil Boozer    Lichen Sclerosus Introduction Lichen sclerosus is a skin problem. It can happen on any part of the body. It happens most often in the anal or genital areas. It can cause itching and discomfort. Treatment can help to control symptoms. This skin problem is not passed from one person to another (not contagious). The cause is not known. Follow these instructions at home:  Take over-the-counter and prescription medicines only as told by your doctor.  Use creams or ointments as told by your doctor.  Do not scratch the affected areas of skin.  Women should keep the vagina as clean and dry as they can.  Keep all follow-up visits as told by your doctor. This is important. Contact a doctor if:  Your redness, swelling, or pain gets worse.  You have fluid, blood, or pus coming from the area.  You have new patches (lesions) on your skin.  You have a fever.  You have pain during sex. This information is not intended to replace advice given to you by your health care provider. Make sure you discuss any questions you have with your health care provider. Document Released: 04/30/2008 Document Revised: 10/24/2015 Document Reviewed: 08/13/2014  2017 Elsevier

## 2016-05-27 ENCOUNTER — Other Ambulatory Visit: Payer: Self-pay | Admitting: Family Medicine

## 2016-05-28 LAB — COMPREHENSIVE METABOLIC PANEL
ALBUMIN: 4.3 g/dL (ref 3.6–4.8)
ALK PHOS: 51 IU/L (ref 39–117)
ALT: 21 IU/L (ref 0–32)
AST: 19 IU/L (ref 0–40)
Albumin/Globulin Ratio: 1.6 (ref 1.2–2.2)
BILIRUBIN TOTAL: 0.4 mg/dL (ref 0.0–1.2)
BUN / CREAT RATIO: 17 (ref 12–28)
BUN: 12 mg/dL (ref 8–27)
CHLORIDE: 102 mmol/L (ref 96–106)
CO2: 24 mmol/L (ref 18–29)
CREATININE: 0.69 mg/dL (ref 0.57–1.00)
Calcium: 10 mg/dL (ref 8.7–10.3)
GFR calc non Af Amer: 90 mL/min/{1.73_m2} (ref >59)
GFR, EST AFRICAN AMERICAN: 103 mL/min/{1.73_m2} (ref >59)
GLOBULIN, TOTAL: 2.7 g/dL (ref 1.5–4.5)
Glucose: 85 mg/dL (ref 65–99)
Potassium: 4.6 mmol/L (ref 3.5–5.2)
SODIUM: 142 mmol/L (ref 134–144)
TOTAL PROTEIN: 7 g/dL (ref 6.0–8.5)

## 2016-05-28 LAB — CBC WITH DIFFERENTIAL/PLATELET
BASOS ABS: 0.1 10*3/uL (ref 0.0–0.2)
Basos: 2 %
EOS (ABSOLUTE): 0.1 10*3/uL (ref 0.0–0.4)
Eos: 4 %
HEMATOCRIT: 42.8 % (ref 34.0–46.6)
HEMOGLOBIN: 14.2 g/dL (ref 11.1–15.9)
IMMATURE GRANS (ABS): 0 10*3/uL (ref 0.0–0.1)
IMMATURE GRANULOCYTES: 0 %
LYMPHS: 58 %
Lymphocytes Absolute: 2 10*3/uL (ref 0.7–3.1)
MCH: 28.7 pg (ref 26.6–33.0)
MCHC: 33.2 g/dL (ref 31.5–35.7)
MCV: 87 fL (ref 79–97)
MONOCYTES: 6 %
Monocytes Absolute: 0.2 10*3/uL (ref 0.1–0.9)
NEUTROS PCT: 30 %
Neutrophils Absolute: 1 10*3/uL — ABNORMAL LOW (ref 1.4–7.0)
Platelets: 216 10*3/uL (ref 150–379)
RBC: 4.95 x10E6/uL (ref 3.77–5.28)
RDW: 14.1 % (ref 12.3–15.4)
WBC: 3.3 10*3/uL — AB (ref 3.4–10.8)

## 2016-05-28 LAB — LIPID PANEL W/O CHOL/HDL RATIO
CHOLESTEROL TOTAL: 153 mg/dL (ref 100–199)
HDL: 57 mg/dL (ref >39)
LDL CALC: 76 mg/dL (ref 0–99)
Triglycerides: 100 mg/dL (ref 0–149)
VLDL Cholesterol Cal: 20 mg/dL (ref 5–40)

## 2016-05-28 LAB — AMBIG ABBREV CMP14 DEFAULT

## 2016-05-28 LAB — HGB A1C W/O EAG: Hgb A1c MFr Bld: 5.4 % (ref 4.8–5.6)

## 2016-05-28 LAB — AMBIG ABBREV LP DEFAULT

## 2016-06-02 ENCOUNTER — Ambulatory Visit (INDEPENDENT_AMBULATORY_CARE_PROVIDER_SITE_OTHER): Payer: Medicare Other | Admitting: Family Medicine

## 2016-06-02 ENCOUNTER — Encounter: Payer: Self-pay | Admitting: Family Medicine

## 2016-06-02 VITALS — BP 118/60 | HR 62 | Temp 97.7°F | Resp 16 | Ht 62.0 in | Wt 174.5 lb

## 2016-06-02 DIAGNOSIS — Z1211 Encounter for screening for malignant neoplasm of colon: Secondary | ICD-10-CM

## 2016-06-02 DIAGNOSIS — Z0001 Encounter for general adult medical examination with abnormal findings: Secondary | ICD-10-CM

## 2016-06-02 DIAGNOSIS — E7800 Pure hypercholesterolemia, unspecified: Secondary | ICD-10-CM | POA: Diagnosis not present

## 2016-06-02 DIAGNOSIS — L308 Other specified dermatitis: Secondary | ICD-10-CM

## 2016-06-02 DIAGNOSIS — Z1239 Encounter for other screening for malignant neoplasm of breast: Secondary | ICD-10-CM

## 2016-06-02 DIAGNOSIS — I10 Essential (primary) hypertension: Secondary | ICD-10-CM

## 2016-06-02 DIAGNOSIS — Z8544 Personal history of malignant neoplasm of other female genital organs: Secondary | ICD-10-CM

## 2016-06-02 DIAGNOSIS — L900 Lichen sclerosus et atrophicus: Secondary | ICD-10-CM | POA: Diagnosis not present

## 2016-06-02 DIAGNOSIS — Z0000 Encounter for general adult medical examination without abnormal findings: Secondary | ICD-10-CM

## 2016-06-02 MED ORDER — VERAPAMIL HCL ER 360 MG PO CP24: ORAL_CAPSULE | ORAL | 1 refills | Status: DC

## 2016-06-02 MED ORDER — ROSUVASTATIN CALCIUM 10 MG PO TABS: 10.0000 mg | ORAL_TABLET | Freq: Every day | ORAL | 1 refills | Status: DC

## 2016-06-02 MED ORDER — LISINOPRIL 20 MG PO TABS: 20.0000 mg | ORAL_TABLET | Freq: Every day | ORAL | 1 refills | Status: DC

## 2016-06-02 MED ORDER — PIMECROLIMUS 1 % EX CREA: TOPICAL_CREAM | Freq: Two times a day (BID) | CUTANEOUS | 0 refills | Status: DC

## 2016-06-02 NOTE — Progress Notes (Signed)
Name: Vickie Stewart   MRN: [id number]    DOB: 1945-10-07   Date:06/02/2016       Progress Note  Subjective  Chief Complaint  Chief Complaint  Patient presents with  . Annual Exam    HPI   Functional ability/safety issues: No Issues Hearing issues: Addressed  Activities of daily living: Discussed Home safety issues: No Issues  End Of Life Planning: Offered verbal information regarding advanced directives, healthcare power of attorney.  Preventative care, Health maintenance, Preventative health measures discussed.  Preventative screenings discussed today: lab work, colonoscopy,  mammogram, DEXA.  Low Dose CT Chest recommended if Age 28-80 years, 30 pack-year currently smoking OR have quit w/in 15years.   Lifestyle risk factor issued reviewed: Diet, exercise, weight management, advised patient smoking is not healthy, nutrition/diet.  Preventative health measures discussed (5-10 year plan).  Reviewed and recommended vaccinations: - Pneumovax  - Prevnar  - Annual Influenza - Zostavax - Tdap   Depression screening: Done Fall risk screening: Done Discuss ADLs/IADLs: Done  Current medical providers: See HPI  Other health risk factors identified this visit: No other issues Cognitive impairment issues: None identified  All above discussed with patient. Appropriate education, counseling and referral will be made based upon the above.   HTN: she is compliant with her medication, bp has been at goal, no chest pain , she has occasional palpitation but no SOB or chest pain associated with it.   Eczema : seen by Dermatologist in the past, she is using Elidel to control itching on upper chest and upper back  Hyperlipidemia:  She is on statin therapy and is doing well, no muscle aches, and last LDL was at goal  GERD: she has been taking Omeprazole prn only, doing well, no regurgitation or heart burn at this time. Taking Zantac prn   Obesity: weight has been stable, she  still works and is active at work - working at [employer] , at [employer]  Vaginal lesion: she was seen by Dr. Enzo Bi, diagnosed with lichen sclerosis and was given topical medication that resolved the symptoms  History of vaginal cancer: last pap smear 03/2016, normal , done by Dr. Enzo Bi   Health Maintenance  Topic Date Due  . Colon Cancer Screening  04/11/2017  . Mammogram  07/30/2017  . Tetanus Vaccine  02/25/2021  . Flu Shot  Completed  . DEXA scan (bone density measurement)  Completed  . Shingles Vaccine  Completed  .  Hepatitis C: One time screening is recommended by Center for Disease Control  (CDC) for  adults born from [birth year] through 1963.   Completed  . Pneumonia vaccines  Completed   Patient Active Problem List   Diagnosis Date Noted  . Lichen sclerosus [id number]  . Vaginal atrophy 03/18/2016  . Encounter for follow-up surveillance of vaginal cancer 03/18/2016  . Eczema 10/31/2015  . Left hand pain 09/10/2015  . Thyroid nodule 07/26/2015  . Osteopenia 07/26/2015  . Menopause 07/26/2015  . Irritable colon 07/26/2015  . History of cancer of vagina 07/26/2015  . Diffuse cystic mastopathy 07/26/2015  . Obesity (BMI 30.0-34.9) 07/26/2015  . Intermittent low back pain 07/26/2015  . Hyperlipemia 07/26/2015  . Obesity 05/13/2015  . Osteoarthritis 05/13/2015  . Allergic rhinitis 05/13/2015  . GERD (gastroesophageal reflux disease) 05/13/2015  . Essential hypertension, benign     Past Surgical History:  Procedure Laterality Date  . BREAST EXCISIONAL BIOPSY Left    neg  . BREAST LUMPECTOMY Left   .  COLONOSCOPY      Family History  Problem Relation Age of Onset  . Hypertension Father     Social History   Social History  . Marital status: Married    Spouse name: N/A  . Number of children: N/A  . Years of education: N/A   Occupational History  . Not on file.   Social History Main Topics  . Smoking status: Never Smoker  . Smokeless  tobacco: Never Used  . Alcohol use No  . Drug use: No  . Sexual activity: Not Currently   Other Topics Concern  . Not on file   Social History Narrative  . No narrative on file     Current Outpatient Prescriptions:  .  aspirin 81 MG tablet, Take 81 mg by mouth daily. Reported on 10/10/2015, Disp: , Rfl:  .  cholecalciferol (VITAMIN D) 1000 units tablet, Take 1,000 Units by mouth daily., Disp: , Rfl:  .  clobetasol ointment (TEMOVATE) [id number] %, Apply 1 application topically 2 (two) times daily. 2 x daily for 2 weeks then once a day for 4 weeks, Disp: 30 g, Rfl: 3 .  lisinopril (PRINIVIL,ZESTRIL) 20 MG tablet, Take 1 tablet (20 mg total) by mouth daily., Disp: 90 tablet, Rfl: 1 .  LUMIGAN 0.01 % SOLN, , Disp: , Rfl:  .  Multiple Vitamin (MULTIVITAMIN) tablet, Take 1 tablet by mouth daily. Reported on 10/10/2015, Disp: , Rfl:  .  Omega-3 Fatty Acids (FISH OIL CONCENTRATE PO), Take by mouth. Reported on 10/10/2015, Disp: , Rfl:  .  pimecrolimus (ELIDEL) 1 % cream, Apply topically 2 (two) times daily., Disp: 100 g, Rfl: 0 .  rosuvastatin (CRESTOR) 10 MG tablet, Take 1 tablet (10 mg total) by mouth daily., Disp: 90 tablet, Rfl: 1 .  triamcinolone cream (KENALOG) 0.1 %, APPLY TO AFFECTED AREA EXTERNALLY 2 TO 3 TIMES A DAY FOR RASH, Disp: 90 g, Rfl: 0 .  verapamil (VERELAN PM) 360 MG 24 hr capsule, TAKE ONE CAPSULE BY MOUTH ONCE A DAY, Disp: 90 capsule, Rfl: 1 .  vitamin C (ASCORBIC ACID) 500 MG tablet, Take 500 mg by mouth daily. Reported on 10/10/2015, Disp: , Rfl:  .  vitamin E 400 UNIT capsule, Take 400 Units by mouth daily. Reported on 10/10/2015, Disp: , Rfl:   Allergies  Allergen Reactions  . Atorvastatin     difficulty concentrating and focusing  . Terbinafine And Related Dermatitis    Rash, itch, skin discoloration     ROS  Constitutional: Negative for fever or weight change.  Respiratory: Negative for cough and shortness of breath.   Cardiovascular: Negative for chest pain or  palpitations.  Gastrointestinal: Negative for abdominal pain, no bowel changes.  Musculoskeletal: Negative for gait problem or joint swelling.  Skin: Positive  for rash.  Neurological: Negative for dizziness or headache.  No other specific complaints in a complete review of systems (except as listed in HPI above).  Objective  Vitals:   06/02/16 1131  BP: 118/60  Pulse: 62  Resp: 16  Temp: 97.7 F (36.5 C)  TempSrc: Oral  SpO2: 95%  Weight: 174 lb 8 oz (79.2 kg)  Height: 5\' 2"  (1.575 m)    Body mass index is 31.92 kg/m.  Physical Exam  Constitutional: Patient appears well-developed and well-nourished. No distress.  HENT: Head: Normocephalic and atraumatic. Ears: B TMs ok, no erythema or effusion; Nose: Nose normal. Mouth/Throat: Oropharynx is clear and moist. No oropharyngeal exudate.  Eyes: Conjunctivae and EOM are normal. Pupils  are equal, round, and reactive to light. No scleral icterus.  Neck: Normal range of motion. Neck supple. No JVD present. No thyromegaly present.  Cardiovascular: Normal rate, regular rhythm and normal heart sounds.  No murmur heard. No BLE edema. Pulmonary/Chest: Effort normal and breath sounds normal. No respiratory distress. Abdominal: Soft. Bowel sounds are normal, no distension. There is no tenderness. no masses Breast: no lumps or masses, no nipple discharge or rashes FEMALE GENITALIA:  Not done RECTAL: not done Musculoskeletal: Normal range of motion, no joint effusions. No gross deformities Neurological: he is alert and oriented to person, place, and time. No cranial nerve deficit. Coordination, balance, strength, speech and gait are normal.  Skin: Skin is warm and dry. She has old scarring from scratching her upper back ( discussed anxiety - but she does not want to take medications for it) Psychiatric: Patient has a normal mood and affect. behavior is normal. Judgment and thought content normal.     PHQ2/9: Depression screen [facility] 2/9  06/02/2016 01/23/2016 10/10/2015 09/10/2015 07/26/2015  Decreased Interest 0 0 0 0 0  Down, Depressed, Hopeless 0 0 0 0 0  PHQ - 2 Score 0 0 0 0 0     Fall Risk: Fall Risk  06/02/2016 01/23/2016 10/10/2015 09/10/2015 07/26/2015  Falls in the past year? No No No No No     Functional Status Survey: Is the patient deaf or have difficulty hearing?: No Does the patient have difficulty seeing, even when wearing glasses/contacts?: No Does the patient have difficulty concentrating, remembering, or making decisions?: No Does the patient have difficulty walking or climbing stairs?: No Does the patient have difficulty dressing or bathing?: No Does the patient have difficulty doing errands alone such as visiting a doctor's office or shopping?: No    Assessment & Plan  1. Medicare annual wellness visit, subsequent  Discussed importance of 150 minutes of physical activity weekly, eat two servings of fish weekly, eat one serving of tree nuts ( cashews, pistachios, pecans, almonds.Marland Kitchen) every other day, eat 6 servings of fruit/vegetables daily and drink plenty of water and avoid sweet beverages.   2. Lichen sclerosus  resolved  3. History of cancer of vagina  Up to date with pap smear  4. Pure hypercholesterolemia  - rosuvastatin (CRESTOR) 10 MG tablet; Take 1 tablet (10 mg total) by mouth daily.  Dispense: 90 tablet; Refill: 1  5. Essential hypertension, benign  - verapamil (VERELAN PM) 360 MG 24 hr capsule; TAKE ONE CAPSULE BY MOUTH ONCE A DAY  Dispense: 90 capsule; Refill: 1 - lisinopril (PRINIVIL,ZESTRIL) 20 MG tablet; Take 1 tablet (20 mg total) by mouth daily.  Dispense: 90 tablet; Refill: 1  6. Other eczema  - pimecrolimus (ELIDEL) 1 % cream; Apply topically 2 (two) times daily.  Dispense: 100 g; Refill: 0  7. Colon cancer screening  - Ambulatory referral to Gastroenterology  8. Encounter for breast cancer screening other than mammogram  - MM Digital Screening; Future

## 2016-06-04 ENCOUNTER — Other Ambulatory Visit: Payer: Self-pay | Admitting: Family Medicine

## 2016-06-04 ENCOUNTER — Telehealth: Payer: Self-pay | Admitting: Family Medicine

## 2016-06-04 MED ORDER — DESONIDE 0.05 % EX LOTN: TOPICAL_LOTION | Freq: Two times a day (BID) | CUTANEOUS | 0 refills | Status: DC

## 2016-06-04 NOTE — Telephone Encounter (Signed)
Pt requesting return call to discuss the medication desonide. She said if possible call tomorrow after 4

## 2016-06-05 ENCOUNTER — Other Ambulatory Visit: Payer: Self-pay | Admitting: Family Medicine

## 2016-06-05 MED ORDER — DESONIDE CREA-MOISTURIZING LOT 0.05 % EX KIT: 1.0000 g | PACK | Freq: Two times a day (BID) | CUTANEOUS | 0 refills | Status: DC

## 2016-06-05 NOTE — Telephone Encounter (Signed)
Pt called and said that insurance will cover cream or lotion form of desonide please change order

## 2016-06-05 NOTE — Telephone Encounter (Signed)
Change to cream mixed with Cetaphil and if not covered I will change to cream

## 2016-06-05 NOTE — Telephone Encounter (Signed)
Sent lotion already

## 2016-06-05 NOTE — Telephone Encounter (Signed)
Ointment or cream

## 2016-06-11 ENCOUNTER — Other Ambulatory Visit: Payer: Self-pay | Admitting: Family Medicine

## 2016-06-11 NOTE — Telephone Encounter (Signed)
Patient requesting refill of [facility].

## 2016-06-23 ENCOUNTER — Encounter: Payer: Self-pay | Admitting: *Deleted

## 2016-08-03 ENCOUNTER — Encounter: Payer: Self-pay | Admitting: General Surgery

## 2016-08-03 ENCOUNTER — Ambulatory Visit
Admission: RE | Admit: 2016-08-03 | Discharge: 2016-08-03 | Disposition: A | Discharge status: Home / Self Care | Payer: Medicare Other | Source: Ambulatory Visit | Attending: Family Medicine | Admitting: Family Medicine

## 2016-08-03 ENCOUNTER — Ambulatory Visit: Payer: 59

## 2016-08-03 ENCOUNTER — Ambulatory Visit (INDEPENDENT_AMBULATORY_CARE_PROVIDER_SITE_OTHER): Payer: Medicare Other | Admitting: General Surgery

## 2016-08-03 VITALS — BP 124/76 | HR 80 | Resp 12 | Ht 62.0 in | Wt 175.0 lb

## 2016-08-03 DIAGNOSIS — Z1239 Encounter for other screening for malignant neoplasm of breast: Secondary | ICD-10-CM

## 2016-08-03 DIAGNOSIS — Z1211 Encounter for screening for malignant neoplasm of colon: Secondary | ICD-10-CM | POA: Diagnosis not present

## 2016-08-03 DIAGNOSIS — Z1231 Encounter for screening mammogram for malignant neoplasm of breast: Secondary | ICD-10-CM | POA: Diagnosis present

## 2016-08-03 DIAGNOSIS — M940 Chondrocostal junction syndrome [Tietze]: Secondary | ICD-10-CM

## 2016-08-03 MED ORDER — POLYETHYLENE GLYCOL 3350 17 GM/SCOOP PO POWD: ORAL | 0 refills | Status: DC

## 2016-08-03 NOTE — Patient Instructions (Signed)
Colonoscopy, Adult A colonoscopy is an exam to look at the entire large intestine. During the exam, a lubricated, bendable tube is inserted into the anus and then passed into the rectum, colon, and other parts of the large intestine. A colonoscopy is often done as a part of normal colorectal screening or in response to certain symptoms, such as anemia, persistent diarrhea, abdominal pain, and blood in the stool. The exam can help screen for and diagnose medical problems, including:  Tumors.  Polyps.  Inflammation.  Areas of bleeding. Tell a health care provider about:  Any allergies you have.  All medicines you are taking, including vitamins, herbs, eye drops, creams, and over-the-counter medicines.  Any problems you or family members have had with anesthetic medicines.  Any blood disorders you have.  Any surgeries you have had.  Any medical conditions you have.  Any problems you have had passing stool. What are the risks? Generally, this is a safe procedure. However, problems may occur, including:  Bleeding.  A tear in the intestine.  A reaction to medicines given during the exam.  Infection (rare). What happens before the procedure? Eating and drinking restrictions  Follow instructions from your health care provider about eating and drinking, which may include:  A few days before the procedure - follow a low-fiber diet. Avoid nuts, seeds, dried fruit, raw fruits, and vegetables.  1-3 days before the procedure - follow a clear liquid diet. Drink only clear liquids, such as clear broth or bouillon, black coffee or tea, clear juice, clear soft drinks or sports drinks, gelatin dessert, and popsicles. Avoid any liquids that contain red or purple dye.  On the day of the procedure - do not eat or drink anything during the 2 hours before the procedure, or within the time period that your health care provider recommends. Bowel prep  If you were prescribed an oral bowel prep to  clean out your colon:  Take it as told by your health care provider. Starting the day before your procedure, you will need to drink a large amount of medicated liquid. The liquid will cause you to have multiple loose stools until your stool is almost clear or light green.  If your skin or anus gets irritated from diarrhea, you may use these to relieve the irritation:  Medicated wipes, such as adult wet wipes with aloe and vitamin E.  A skin soothing-product like petroleum jelly.  If you vomit while drinking the bowel prep, take a break for up to 60 minutes and then begin the bowel prep again. If vomiting continues and you cannot take the bowel prep without vomiting, call your health care provider. General instructions   Ask your health care provider about changing or stopping your regular medicines. This is especially important if you are taking diabetes medicines or blood thinners.  Plan to have someone take you home from the hospital or clinic. What happens during the procedure?  An IV tube may be inserted into one of your veins.  You will be given medicine to help you relax (sedative).  To reduce your risk of infection:  Your health care team will wash or sanitize their hands.  Your anal area will be washed with soap.  You will be asked to lie on your side with your knees bent.  Your health care provider will lubricate a long, thin, flexible tube. The tube will have a camera and a light on the end.  The tube will be inserted into your anus.    The tube will be gently eased through your rectum and colon.  Air will be delivered into your colon to keep it open. You may feel some pressure or cramping.  The camera will be used to take images during the procedure.  A small tissue sample may be removed from your body to be examined under a microscope (biopsy). If any potential problems are found, the tissue will be sent to a lab for testing.  If small polyps are found, your health  care provider may remove them and have them checked for cancer cells.  The tube that was inserted into your anus will be slowly removed. The procedure may vary among health care providers and hospitals. What happens after the procedure?  Your blood pressure, heart rate, breathing rate, and blood oxygen level will be monitored until the medicines you were given have worn off.  Do not drive for 24 hours after the exam.  You may have a small amount of blood in your stool.  You may pass gas and have mild abdominal cramping or bloating due to the air that was used to inflate your colon during the exam.  It is up to you to get the results of your procedure. Ask your health care provider, or the department performing the procedure, when your results will be ready. This information is not intended to replace advice given to you by your health care provider. Make sure you discuss any questions you have with your health care provider. Document Released: 05/15/2000 Document Revised: 03/18/2016 Document Reviewed: 07/30/2015 Elsevier Interactive Patient Education  2017 Elsevier Inc.  

## 2016-08-03 NOTE — Progress Notes (Addendum)
Patient ID: Vickie Stewart, female   DOB: Aug 29, 1945, 71 y.o.   MRN: [id number]  Chief Complaint  Patient presents with  . Colonoscopy    HPI Vickie Stewart is a 71 y.o. female here for a colonoscopy evaluation. Patient states no GI problems at this time. Last colonoscopy was on 08/26/2006.I have reviewed the history of present illness with the patient.  HPI  Past Medical History:  Diagnosis Date  . Allergic rhinitis, cause unspecified   . Anxiety state, unspecified   . Contact dermatitis and other eczema, due to unspecified cause   . Diffuse cystic mastopathy   . Dyspepsia and other specified disorders of function of stomach   . Essential hypertension, benign   . Glaucoma   . Irritable bowel syndrome   . Lateral epicondylitis  of elbow   . Leukocytopenia, unspecified   . Mastodynia   . Obesity, unspecified   . Pure hypercholesterolemia   . Reflux esophagitis   . Thoracic or lumbosacral neuritis or radiculitis, unspecified   . Tietze's disease   . Unspecified disorder of skin and subcutaneous tissue   . Vaginal cancer [facility])    History    Past Surgical History:  Procedure Laterality Date  . BREAST EXCISIONAL BIOPSY Left    neg  . BREAST LUMPECTOMY Left   . COLONOSCOPY  08/26/2006    Family History  Problem Relation Age of Onset  . Hypertension Father     Social History Social History  Substance Use Topics  . Smoking status: Never Smoker  . Smokeless tobacco: Never Used  . Alcohol use No    Allergies  Allergen Reactions  . Atorvastatin     difficulty concentrating and focusing  . Terbinafine And Related Dermatitis    Rash, itch, skin discoloration    Current Outpatient Prescriptions  Medication Sig Dispense Refill  . aspirin 81 MG tablet Take 81 mg by mouth daily. Reported on 10/10/2015    . cholecalciferol (VITAMIN D) 1000 units tablet Take 1,000 Units by mouth daily.    . clobetasol ointment (TEMOVATE) 3.29 % Apply 1 application topically  2 (two) times daily. 2 x daily for 2 weeks then once a day for 4 weeks 30 g 3  . Desonide Crea-Moisturizing Lot 0.05 % KIT Apply 1 g topically 2 (two) times daily. 1 each 0  . lisinopril (PRINIVIL,ZESTRIL) 20 MG tablet Take 1 tablet (20 mg total) by mouth daily. 90 tablet 1  . LUMIGAN 0.01 % SOLN     . Multiple Vitamin (MULTIVITAMIN) tablet Take 1 tablet by mouth daily. Reported on 10/10/2015    . Omega-3 Fatty Acids (FISH OIL CONCENTRATE PO) Take by mouth. Reported on 10/10/2015    . rosuvastatin (CRESTOR) 10 MG tablet Take 1 tablet (10 mg total) by mouth daily. 90 tablet 1  . triamcinolone cream (KENALOG) 0.1 % APPLY TO AFFECTED AREA EXTERNALLY 2 TO 3 TIMES A DAY FOR RASH 90 g 0  . verapamil (VERELAN PM) 360 MG 24 hr capsule TAKE ONE CAPSULE BY MOUTH ONCE A DAY 90 capsule 1  . vitamin C (ASCORBIC ACID) 500 MG tablet Take 500 mg by mouth daily. Reported on 10/10/2015    . vitamin E 400 UNIT capsule Take 400 Units by mouth daily. Reported on 10/10/2015    . polyethylene glycol powder (GLYCOLAX/MIRALAX) powder 255 grams one bottle for colonoscopy prep 255 g 0   No current facility-administered medications for this visit.     Review of Systems Review of Systems  Constitutional: Negative.   Respiratory: Negative.   Cardiovascular: Negative.     Blood pressure 124/76, pulse 80, resp. rate 12, height '5\' 2"'  (1.575 m), weight 175 lb (79.4 kg).  Physical Exam Physical Exam  Constitutional: She is oriented to person, place, and time. She appears well-developed and well-nourished.  Eyes: Conjunctivae are normal. No scleral icterus.  Neck: Neck supple.  Cardiovascular: Normal rate, regular rhythm and normal heart sounds.   Pulmonary/Chest: Effort normal and breath sounds normal.  Abdominal: Soft. Bowel sounds are normal. There is no tenderness.  Lymphadenopathy:    She has no cervical adenopathy.  Neurological: She is alert and oriented to person, place, and time.  Skin: Skin is dry.    Data  Reviewed Notes reviewed   Assessment    Stable exam. Need for screening colonoscopy. Last one 10 years ago.     Plan  Procedure, risks, and benefits explained to the patient. Patient is agreeable and states she will schedule the procedure over her work break in April.   Colonoscopy with possible biopsy/polypectomy prn: Information regarding the procedure, including its potential risks and complications (including but not limited to perforation of the bowel, which may require emergency surgery to repair, and bleeding) was verbally given to the patient. Educational information regarding lower intestinal endoscopy was given to the patient. Written instructions for how to complete the bowel prep using Miralax were provided. The importance of drinking ample fluids to avoid dehydration as a result of the prep emphasized.  Patient has been scheduled for a colonoscopy on 09-01-16 at [facility]. It is okay for patient to continue 81 mg aspirin once daily. Patient has been asked to discontinue fish oil one week prior to procedure.   This information has been scribed by Gaspar Cola CMA.        SANKAR,SEEPLAPUTHUR G 08/03/2016, 4:43 PM  At patient's request, Tietze disease was removed form the [facility] and problem list on 03/17/2017

## 2016-08-17 ENCOUNTER — Telehealth: Payer: Self-pay

## 2016-08-17 NOTE — Telephone Encounter (Signed)
Can we changed pharmacy?

## 2016-08-17 NOTE — Telephone Encounter (Signed)
Verapamil has a recall on it and [facility]) is wondering if there is an alternative that you would like to use. Verapamil is generic and all [employer] see is the name brand. She does not see anything in her system for alternative use.  [facility] #: [id number]  Also please contact patient [phone] when complete.

## 2016-08-17 NOTE — Telephone Encounter (Signed)
[facility] [place] that Verapamil can no longer be ordered through them. Could you please switch to a different medication. Thanks

## 2016-08-17 NOTE — Telephone Encounter (Signed)
Pt states it is ok to send this RX to [place].

## 2016-08-18 ENCOUNTER — Other Ambulatory Visit: Payer: Self-pay | Admitting: Family Medicine

## 2016-08-18 MED ORDER — DILTIAZEM HCL ER COATED BEADS 360 MG PO CP24: 360.0000 mg | ORAL_CAPSULE | Freq: Every day | ORAL | 0 refills | Status: DC

## 2016-08-18 NOTE — Telephone Encounter (Signed)
Sent rx for Diltiazem 360

## 2016-08-18 NOTE — Telephone Encounter (Signed)
Pt verbally informed. °

## 2016-08-24 ENCOUNTER — Telehealth: Payer: Self-pay | Admitting: *Deleted

## 2016-08-24 NOTE — Telephone Encounter (Signed)
Patient called the office to report that she was placed on antibiotics for an upper respiratory infection. She started antibiotics today and states she is on amoxicillin 875-125 mg one by mouth twice daily for 10 days.   This patient is currently scheduled for a colonoscopy next Tuesday, 09-01-16 at [facility] with Dr. Jamal Collin.   Patient states she is not running a fever.

## 2016-08-26 NOTE — Telephone Encounter (Signed)
Per Dr. Jamal Collin, it is okay to proceed with colonoscopy as scheduled next week as long as the patient is feeling up to it.   Patient notified of this and verbalizes understanding.   We will keep things as scheduled unless we hear differently from patient.

## 2016-08-27 ENCOUNTER — Other Ambulatory Visit: Payer: Self-pay | Admitting: General Surgery

## 2016-08-31 ENCOUNTER — Telehealth: Payer: Self-pay | Admitting: *Deleted

## 2016-08-31 NOTE — Telephone Encounter (Signed)
Message left on home number for patient to call the office.  

## 2016-08-31 NOTE — Telephone Encounter (Signed)
-----   Message from Georgana Curio sent at 08/28/2016 12:22 PM EDT ----- Contact: [phone] [name] called in on Friday.  She is sick on antibiotics for a upper respiratory infection and would like to reschedule her colonoscopy on the schedule for Tuesday 4-3.  Please contact her at [phone] on Monday 4-2.

## 2016-08-31 NOTE — Telephone Encounter (Signed)
Spoke with the patient today and she states she is experiencing dizziness from medication that she was placed on for upper respiratory infection.   Patient wishes to cancel colonoscopy for now. She will contact the office to reschedule the colonoscopy at her convenience. This patient will most likely wait until after the school year is over to have this done.

## 2016-09-01 ENCOUNTER — Ambulatory Visit: Admission: RE | Admit: 2016-09-01 | Payer: Medicare Other | Source: Ambulatory Visit | Admitting: General Surgery

## 2016-09-01 ENCOUNTER — Encounter: Admission: RE | Payer: Self-pay | Source: Ambulatory Visit

## 2016-09-01 SURGERY — COLONOSCOPY WITH PROPOFOL
Anesthesia: General

## 2016-09-10 ENCOUNTER — Telehealth: Payer: Self-pay | Admitting: *Deleted

## 2016-09-10 NOTE — Telephone Encounter (Signed)
Patient called the office today to see if she can reschedule her colonoscopy till after 11-01-16 when school is out.   She would like to see if Dr. Jamal Collin will want her to come in for a pre-op visit prior. Patient is trying to avoid this.   Patient requests a call back at [phone].

## 2016-09-14 NOTE — Telephone Encounter (Signed)
Patient's colonoscopy has been rescheduled for 10-21-16 at [facility]. This patient is aware to call the office if they have further questions.

## 2016-09-14 NOTE — Telephone Encounter (Signed)
Message left on home number for patient to call the office.   Patient can reschedule her colonoscopy after school is out and not be required to come in for an office visit prior; however, if patient has a change in her health she will need to notify our office per Dr. Jamal Collin.

## 2016-10-09 ENCOUNTER — Telehealth: Payer: Self-pay

## 2016-10-09 NOTE — Telephone Encounter (Signed)
Pt aware I have spoke with Santiago Glad from her insurance company. She faxed over a list of icd10 codes that will cover the hpv testing but  Per Dr D there is not a  code that will work.They are not wanting to pay unless we use one of those codes.

## 2016-10-20 ENCOUNTER — Encounter: Payer: Self-pay | Admitting: *Deleted

## 2016-10-21 ENCOUNTER — Ambulatory Visit: Payer: Medicare Other | Admitting: Certified Registered"

## 2016-10-21 ENCOUNTER — Ambulatory Visit
Admission: RE | Admit: 2016-10-21 | Discharge: 2016-10-21 | Disposition: A | Discharge status: Home / Self Care | Payer: Medicare Other | Source: Ambulatory Visit | Attending: General Surgery | Admitting: General Surgery

## 2016-10-21 ENCOUNTER — Encounter
Admission: RE | Disposition: A | Discharge status: Home / Self Care | Payer: Self-pay | Source: Ambulatory Visit | Attending: General Surgery

## 2016-10-21 DIAGNOSIS — Z8249 Family history of ischemic heart disease and other diseases of the circulatory system: Secondary | ICD-10-CM | POA: Insufficient documentation

## 2016-10-21 DIAGNOSIS — Z8544 Personal history of malignant neoplasm of other female genital organs: Secondary | ICD-10-CM | POA: Diagnosis not present

## 2016-10-21 DIAGNOSIS — K210 Gastro-esophageal reflux disease with esophagitis: Secondary | ICD-10-CM | POA: Diagnosis not present

## 2016-10-21 DIAGNOSIS — E669 Obesity, unspecified: Secondary | ICD-10-CM | POA: Diagnosis not present

## 2016-10-21 DIAGNOSIS — K589 Irritable bowel syndrome without diarrhea: Secondary | ICD-10-CM | POA: Diagnosis not present

## 2016-10-21 DIAGNOSIS — Z7982 Long term (current) use of aspirin: Secondary | ICD-10-CM | POA: Diagnosis not present

## 2016-10-21 DIAGNOSIS — E7800 Pure hypercholesterolemia, unspecified: Secondary | ICD-10-CM | POA: Diagnosis not present

## 2016-10-21 DIAGNOSIS — H409 Unspecified glaucoma: Secondary | ICD-10-CM | POA: Insufficient documentation

## 2016-10-21 DIAGNOSIS — Z888 Allergy status to other drugs, medicaments and biological substances status: Secondary | ICD-10-CM | POA: Insufficient documentation

## 2016-10-21 DIAGNOSIS — J309 Allergic rhinitis, unspecified: Secondary | ICD-10-CM | POA: Insufficient documentation

## 2016-10-21 DIAGNOSIS — K5730 Diverticulosis of large intestine without perforation or abscess without bleeding: Secondary | ICD-10-CM | POA: Insufficient documentation

## 2016-10-21 DIAGNOSIS — Z6830 Body mass index (BMI) 30.0-30.9, adult: Secondary | ICD-10-CM | POA: Diagnosis not present

## 2016-10-21 DIAGNOSIS — M5414 Radiculopathy, thoracic region: Secondary | ICD-10-CM | POA: Diagnosis not present

## 2016-10-21 DIAGNOSIS — F419 Anxiety disorder, unspecified: Secondary | ICD-10-CM | POA: Insufficient documentation

## 2016-10-21 DIAGNOSIS — Z1211 Encounter for screening for malignant neoplasm of colon: Secondary | ICD-10-CM | POA: Diagnosis not present

## 2016-10-21 DIAGNOSIS — L309 Dermatitis, unspecified: Secondary | ICD-10-CM | POA: Diagnosis not present

## 2016-10-21 DIAGNOSIS — I10 Essential (primary) hypertension: Secondary | ICD-10-CM | POA: Diagnosis not present

## 2016-10-21 HISTORY — PX: COLONOSCOPY WITH PROPOFOL: SHX5780

## 2016-10-21 SURGERY — COLONOSCOPY WITH PROPOFOL
Anesthesia: General

## 2016-10-21 MED ORDER — PROPOFOL 500 MG/50ML IV EMUL
INTRAVENOUS | Status: DC | PRN
  Administered 2016-10-21: 150 ug/kg/min via INTRAVENOUS

## 2016-10-21 MED ORDER — SODIUM CHLORIDE 0.9 % IV SOLN
INTRAVENOUS | Status: DC | PRN
  Administered 2016-10-21: 08:00:00 via INTRAVENOUS

## 2016-10-21 MED ORDER — PROPOFOL 500 MG/50ML IV EMUL
INTRAVENOUS | Status: AC
  Filled 2016-10-21: qty 50

## 2016-10-21 MED ORDER — PROPOFOL 10 MG/ML IV BOLUS
INTRAVENOUS | Status: DC | PRN
  Administered 2016-10-21: 60 mg via INTRAVENOUS

## 2016-10-21 MED ORDER — PHENYLEPHRINE HCL 10 MG/ML IJ SOLN
INTRAMUSCULAR | Status: AC
  Filled 2016-10-21: qty 1

## 2016-10-21 NOTE — Anesthesia Post-op Follow-up Note (Cosign Needed)
Anesthesia QCDR form completed.        

## 2016-10-21 NOTE — H&P (Signed)
Vickie Stewart is an 71 y.o. female.   Chief Complaint: here for colonoscopy HPI:71 yr old female here for screening colonoscopy. Last one in 2008 was normal  Past Medical History:  Diagnosis Date  . Allergic rhinitis, cause unspecified   . Anxiety state, unspecified   . Contact dermatitis and other eczema, due to unspecified cause   . Diffuse cystic mastopathy   . Dyspepsia and other specified disorders of function of stomach   . Essential hypertension, benign   . Glaucoma   . Irritable bowel syndrome   . Lateral epicondylitis  of elbow   . Leukocytopenia, unspecified   . Mastodynia   . Obesity, unspecified   . Pure hypercholesterolemia   . Reflux esophagitis   . Thoracic or lumbosacral neuritis or radiculitis, unspecified   . Tietze's disease   . Unspecified disorder of skin and subcutaneous tissue   . Vaginal cancer [facility])    History    Past Surgical History:  Procedure Laterality Date  . BREAST EXCISIONAL BIOPSY Left    neg  . BREAST LUMPECTOMY Left   . COLONOSCOPY  08/26/2006    Family History  Problem Relation Age of Onset  . Hypertension Father    Social History:  reports that she has never smoked. She has never used smokeless tobacco. She reports that she does not drink alcohol or use drugs.  Allergies:  Allergies  Allergen Reactions  . Atorvastatin     difficulty concentrating and focusing  . Terbinafine And Related Dermatitis    Rash, itch, skin discoloration    Medications Prior to Admission  Medication Sig Dispense Refill  . aspirin 81 MG tablet Take 81 mg by mouth daily. Reported on 10/10/2015    . cholecalciferol (VITAMIN D) 1000 units tablet Take 1,000 Units by mouth daily.    Marland Kitchen lisinopril (PRINIVIL,ZESTRIL) 20 MG tablet Take 1 tablet (20 mg total) by mouth daily. 90 tablet 1  . LUMIGAN 0.01 % SOLN     . Multiple Vitamin (MULTIVITAMIN) tablet Take 1 tablet by mouth daily. Reported on 10/10/2015    . Omega-3 Fatty Acids (FISH OIL CONCENTRATE  PO) Take by mouth. Reported on 10/10/2015    . polyethylene glycol powder (GLYCOLAX/MIRALAX) powder 255 grams one bottle for colonoscopy prep 255 g 0  . triamcinolone cream (KENALOG) 0.1 % APPLY TO AFFECTED AREA EXTERNALLY 2 TO 3 TIMES A DAY FOR RASH 90 g 0  . vitamin C (ASCORBIC ACID) 500 MG tablet Take 500 mg by mouth daily. Reported on 10/10/2015    . vitamin E 400 UNIT capsule Take 400 Units by mouth daily. Reported on 10/10/2015    . amoxicillin-clavulanate (AUGMENTIN) 875-125 MG tablet Take 1 tablet by mouth 2 (two) times daily.    . clobetasol ointment (TEMOVATE) 5.00 % Apply 1 application topically 2 (two) times daily. 2 x daily for 2 weeks then once a day for 4 weeks (Patient not taking: Reported on 10/21/2016) 30 g 3  . Desonide Crea-Moisturizing Lot 0.05 % KIT Apply 1 g topically 2 (two) times daily. (Patient not taking: Reported on 10/21/2016) 1 each 0  . diltiazem (CARDIZEM CD) 360 MG 24 hr capsule Take 1 capsule (360 mg total) by mouth daily. 90 capsule 0  . rosuvastatin (CRESTOR) 10 MG tablet Take 1 tablet (10 mg total) by mouth daily. 90 tablet 1    No results found for this or any previous visit (from the past 48 hour(s)). No results found.  Review of Systems  Constitutional:  Negative.   Respiratory: Negative.   Cardiovascular: Negative.   Gastrointestinal: Negative.   Genitourinary: Negative.     Blood pressure (!) 151/77, pulse (!) 52, temperature (!) 96.8 F (36 C), temperature source Tympanic, resp. rate 16, height '5\' 2"'  (1.575 m), weight 168 lb (76.2 kg), SpO2 99 %. Physical Exam  Constitutional: She is oriented to person, place, and time. She appears well-developed and well-nourished.  Eyes: Conjunctivae are normal. No scleral icterus.  Neck: Neck supple.  Cardiovascular: Normal rate, regular rhythm and normal heart sounds.   Respiratory: Effort normal and breath sounds normal.  GI: Soft. Bowel sounds are normal. She exhibits no mass. There is no tenderness.   Lymphadenopathy:    She has no cervical adenopathy.  Neurological: She is alert and oriented to person, place, and time.  Skin: Skin is warm and dry.     Assessment/Plan Healthy 71 yr old female. Proceed with colonoscopy as planned  Christene Lye, MD 10/21/2016, 8:00 AM

## 2016-10-21 NOTE — Op Note (Signed)
[facility] Patient Name: Vickie Stewart Procedure Date: 10/21/2016 7:47 AM MRN: [id number] Account #: [id number] Date of Birth: 04-Mar-1946 Admit Type: Outpatient Age: 71 Room: [facility] ENDO ROOM 1 Gender: Female Note Status: Finalized Procedure:            Colonoscopy Indications:          Screening for colorectal malignant neoplasm Providers:            Seeplaputhur G. Jamal Collin, MD Medicines:            Total IV Anesthesia (TIVA) Complications:        No immediate complications. Procedure:            Pre-Anesthesia Assessment:                       - General anesthesia under the supervision of an                        anesthesiologist was determined to be medically                        necessary for this procedure based on review of the                        patient's medical history, medications, and prior                        anesthesia history.                       After obtaining informed consent, the colonoscope was                        passed under direct vision. Throughout the procedure,                        the patient's blood pressure, pulse, and oxygen                        saturations were monitored continuously. The                        colonoscopy was performed without difficulty. The                        patient tolerated the procedure well. The quality of                        the bowel preparation was excellent. The Colonoscope                        was introduced through the and advanced to the the                        cecum, identified by the ileocecal valve. Findings:      The perianal and digital rectal examinations were normal.      A single small-mouthed diverticulum was found in the sigmoid colon.      The exam was otherwise without abnormality. Impression:           - Diverticulosis in the sigmoid colon.                       -  The examination was otherwise normal.                       - No specimens  collected. Recommendation:       - Discharge patient to home.                       - Resume previous diet.                       - Continue present medications.                       - Repeat colonoscopy in 10 years for screening purposes. Diagnosis Code(s):    --- Professional ---                       Z12.11, Encounter for screening for malignant neoplasm                        of colon                       K57.30, Diverticulosis of large intestine without                        perforation or abscess without bleeding Christene Lye, MD 10/21/2016 8:33:54 AM This report has been signed electronically. Number of Addenda: 0 Note Initiated On: 10/21/2016 7:47 AM Scope Withdrawal Time: 0 hours 4 minutes 29 seconds  Total Procedure Duration: 0 hours 18 minutes 30 seconds       [facility]

## 2016-10-21 NOTE — Interval H&P Note (Signed)
History and Physical Interval Note:  10/21/2016 8:03 AM  [place]  has presented today for surgery, with the diagnosis of SCREENING  The various methods of treatment have been discussed with the patient and family. After consideration of risks, benefits and other options for treatment, the patient has consented to  Procedure(s): COLONOSCOPY WITH PROPOFOL (N/A) as a surgical intervention .  The patient's history has been reviewed, patient examined, no change in status, stable for surgery.  I have reviewed the patient's chart and labs.  Questions were answered to the patient's satisfaction.     , G

## 2016-10-21 NOTE — Transfer of Care (Signed)
Immediate Anesthesia Transfer of Care Note  Patient: Vickie Stewart  Procedure(s) Performed: Procedure(s): COLONOSCOPY WITH PROPOFOL (N/A)  Patient Location: PACU  Anesthesia Type:General  Level of Consciousness: sedated and responds to stimulation  Airway & Oxygen Therapy: Patient Spontanous Breathing and Patient connected to nasal cannula oxygen  Post-op Assessment: Report given to RN and Post -op Vital signs reviewed and stable  Post vital signs: Reviewed and stable  Last Vitals:  Vitals:   10/21/16 0832 10/21/16 0835  BP: (!) 101/49 (!) 101/49  Pulse: (!) 57 (!) 56  Resp: 16 13  Temp: 36.4 C     Last Pain:  Vitals:   10/21/16 0832  TempSrc: Tympanic         Complications: No apparent anesthesia complications

## 2016-10-21 NOTE — Anesthesia Preprocedure Evaluation (Signed)
Anesthesia Evaluation  Patient identified by MRN, date of birth, ID band Patient awake    Reviewed: Allergy & Precautions, H&P , NPO status , Patient's Chart, lab work & pertinent test results, reviewed documented beta blocker date and time   History of Anesthesia Complications Negative for: history of anesthetic complications  Airway Mallampati: I  TM Distance: >3 FB Neck ROM: full    Dental  (+) Dental Advidsory Given, Teeth Intact   Pulmonary neg pulmonary ROS,           Cardiovascular Exercise Tolerance: Good hypertension, (-) angina(-) CAD, (-) Past MI, (-) Cardiac Stents and (-) CABG (-) dysrhythmias (-) Valvular Problems/Murmurs     Neuro/Psych negative neurological ROS  negative psych ROS   GI/Hepatic Neg liver ROS, GERD  ,  Endo/Other  negative endocrine ROS  Renal/GU negative Renal ROS  negative genitourinary   Musculoskeletal   Abdominal   Peds  Hematology negative hematology ROS (+)   Anesthesia Other Findings Past Medical History: No date: Allergic rhinitis, cause unspecified No date: Anxiety state, unspecified No date: Contact dermatitis and other eczema, due to un* No date: Diffuse cystic mastopathy No date: Dyspepsia and other specified disorders of fun* No date: Essential hypertension, benign No date: Glaucoma No date: Irritable bowel syndrome No date: Lateral epicondylitis  of elbow No date: Leukocytopenia, unspecified No date: Mastodynia No date: Obesity, unspecified No date: Pure hypercholesterolemia No date: Reflux esophagitis No date: Thoracic or lumbosacral neuritis or radiculiti* No date: Tietze's disease No date: Unspecified disorder of skin and subcutaneous * No date: Vaginal cancer ([place])     Comment: History   Reproductive/Obstetrics negative OB ROS                             Anesthesia Physical Anesthesia Plan  ASA: II  Anesthesia Plan: General    Post-op Pain Management:    Induction:   Airway Management Planned:   Additional Equipment:   Intra-op Plan:   Post-operative Plan:   Informed Consent: I have reviewed the patients History and Physical, chart, labs and discussed the procedure including the risks, benefits and alternatives for the proposed anesthesia with the patient or authorized representative who has indicated his/her understanding and acceptance.   Dental Advisory Given  Plan Discussed with: Anesthesiologist, CRNA and Surgeon  Anesthesia Plan Comments:         Anesthesia Quick Evaluation

## 2016-10-22 ENCOUNTER — Encounter: Payer: Self-pay | Admitting: General Surgery

## 2016-10-22 NOTE — Anesthesia Postprocedure Evaluation (Signed)
Anesthesia Post Note  Patient: Vickie Stewart  Procedure(s) Performed: Procedure(s) (LRB): COLONOSCOPY WITH PROPOFOL (N/A)  Patient location during evaluation: Endoscopy Anesthesia Type: General Level of consciousness: awake and alert Pain management: pain level controlled Vital Signs Assessment: post-procedure vital signs reviewed and stable Respiratory status: spontaneous breathing, nonlabored ventilation, respiratory function stable and patient connected to nasal cannula oxygen Cardiovascular status: blood pressure returned to baseline and stable Postop Assessment: no signs of nausea or vomiting Anesthetic complications: no     Last Vitals:  Vitals:   10/21/16 0852 10/21/16 0902  BP: 120/61 90/76  Pulse: (!) 49 (!) 50  Resp: 17 20  Temp:      Last Pain:  Vitals:   10/21/16 0832  TempSrc: Tympanic                 Martha Clan

## 2016-11-16 ENCOUNTER — Other Ambulatory Visit: Payer: Self-pay | Admitting: Family Medicine

## 2016-11-16 NOTE — Telephone Encounter (Signed)
Patient requesting refill of [facility].  

## 2016-11-28 ENCOUNTER — Other Ambulatory Visit: Payer: Self-pay | Admitting: Family Medicine

## 2016-12-07 ENCOUNTER — Ambulatory Visit (INDEPENDENT_AMBULATORY_CARE_PROVIDER_SITE_OTHER): Payer: Medicare Other | Admitting: Family Medicine

## 2016-12-07 ENCOUNTER — Encounter: Payer: Self-pay | Admitting: Family Medicine

## 2016-12-07 VITALS — BP 128/72 | HR 65 | Temp 98.0°F | Resp 18 | Ht 62.0 in | Wt 168.5 lb

## 2016-12-07 DIAGNOSIS — I10 Essential (primary) hypertension: Secondary | ICD-10-CM | POA: Diagnosis not present

## 2016-12-07 DIAGNOSIS — L308 Other specified dermatitis: Secondary | ICD-10-CM

## 2016-12-07 DIAGNOSIS — K219 Gastro-esophageal reflux disease without esophagitis: Secondary | ICD-10-CM | POA: Diagnosis not present

## 2016-12-07 DIAGNOSIS — E041 Nontoxic single thyroid nodule: Secondary | ICD-10-CM | POA: Diagnosis not present

## 2016-12-07 DIAGNOSIS — L309 Dermatitis, unspecified: Secondary | ICD-10-CM

## 2016-12-07 DIAGNOSIS — E7800 Pure hypercholesterolemia, unspecified: Secondary | ICD-10-CM

## 2016-12-07 MED ORDER — DILTIAZEM HCL ER COATED BEADS 360 MG PO CP24: 360.0000 mg | ORAL_CAPSULE | Freq: Every day | ORAL | 1 refills | Status: DC

## 2016-12-07 MED ORDER — ROSUVASTATIN CALCIUM 10 MG PO TABS: 10.0000 mg | ORAL_TABLET | Freq: Every day | ORAL | 1 refills | Status: DC

## 2016-12-07 MED ORDER — LORATADINE 10 MG PO TABS: 10.0000 mg | ORAL_TABLET | Freq: Every day | ORAL | 2 refills | Status: DC

## 2016-12-07 MED ORDER — LISINOPRIL 20 MG PO TABS: 20.0000 mg | ORAL_TABLET | Freq: Every day | ORAL | 1 refills | Status: DC

## 2016-12-07 MED ORDER — PIMECROLIMUS 1 % EX CREA: TOPICAL_CREAM | Freq: Two times a day (BID) | CUTANEOUS | 0 refills | Status: DC

## 2016-12-07 MED ORDER — TRIAMCINOLONE ACETONIDE 0.1 % EX CREA: TOPICAL_CREAM | CUTANEOUS | 0 refills | Status: DC

## 2016-12-07 NOTE — Progress Notes (Signed)
Name: Vickie Stewart   MRN: [id number]    DOB: August 02, 1945   Date:12/07/2016       Progress Note  Subjective  Chief Complaint  Chief Complaint  Patient presents with  . Hypertension    6 month follow up    HPI  HTN: she is compliant with her medication,  she has occasional palpitation ( very seldom now)  but no SOB or chest pain associated with it. BP is at goal   Eczema : seen by Dermatologist in the past, she is using Triamcinolone  to control itching on upper chest and upper back, neck, she states she also has noticed outbreaks on her face intermittently and we will try PA for Elidel   Hyperlipidemia:  She is on statin therapy and is doing well, no muscle aches, and last LDL was at goal. She could not tolerate Atorvastatin but doing well on Crestor  GERD: she has been avoiding going to bed after meals, denies  regurgitation or heart burn at this time. Taking Zantac prn , off PPI   Obesity: weight has been stable, she still works and is active at work - working at [employer] , at [employer], but off during [employer], she is avoid sweets, she is active at home, mowing yard.  History of vaginal cancer: last pap smear 03/2016, normal , done by Dr. Enzo Bi, she has lichen sclerosis and he rx Temovate for her to use topically, currently no itching or discomfort, uses medication prn only   Goiter: seen by Endocrinologist back in 2015 but lost to follow up for repeat US, we will send her now  Patient Active Problem List   Diagnosis Date Noted  . Lichen sclerosus 76/16/0737  . Vaginal atrophy 03/18/2016  . Encounter for follow-up surveillance of vaginal cancer 03/18/2016  . Eczema 10/31/2015  . Left hand pain 09/10/2015  . Thyroid nodule 07/26/2015  . Osteopenia 07/26/2015  . Menopause 07/26/2015  . Irritable colon 07/26/2015  . History of cancer of vagina 07/26/2015  . Diffuse cystic mastopathy 07/26/2015  . Obesity (BMI 30.0-34.9) 07/26/2015  .  Intermittent low back pain 07/26/2015  . Hyperlipemia 07/26/2015  . Obesity 05/13/2015  . Osteoarthritis 05/13/2015  . Allergic rhinitis 05/13/2015  . GERD (gastroesophageal reflux disease) 05/13/2015  . Essential hypertension, benign     Past Surgical History:  Procedure Laterality Date  . BREAST EXCISIONAL BIOPSY Left    neg  . BREAST LUMPECTOMY Left   . COLONOSCOPY  08/26/2006  . COLONOSCOPY WITH PROPOFOL N/A 10/21/2016   Procedure: COLONOSCOPY WITH PROPOFOL;  Surgeon: Christene Lye, MD;  Location: [facility] ENDOSCOPY;  Service: Endoscopy;  Laterality: N/A;    Family History  Problem Relation Age of Onset  . Hypertension Father     Social History   Social History  . Marital status: Married    Spouse name: N/A  . Number of children: N/A  . Years of education: N/A   Occupational History  . Not on file.   Social History Main Topics  . Smoking status: Never Smoker  . Smokeless tobacco: Never Used  . Alcohol use No  . Drug use: No  . Sexual activity: Not Currently   Other Topics Concern  . Not on file   Social History Narrative  . No narrative on file     Current Outpatient Prescriptions:  .  aspirin 81 MG tablet, Take 81 mg by mouth daily. Reported on 10/10/2015, Disp: , Rfl:  .  cholecalciferol (  VITAMIN D) 1000 units tablet, Take 1,000 Units by mouth daily., Disp: , Rfl:  .  clobetasol ointment (TEMOVATE) 0.63 %, Apply 1 application topically 2 (two) times daily. 2 x daily for 2 weeks then once a day for 4 weeks (Patient not taking: Reported on 10/21/2016), Disp: 30 g, Rfl: 3 .  diltiazem (CARDIZEM CD) 360 MG 24 hr capsule, Take 1 capsule (360 mg total) by mouth daily., Disp: 90 capsule, Rfl: 1 .  lisinopril (PRINIVIL,ZESTRIL) 20 MG tablet, Take 1 tablet (20 mg total) by mouth daily., Disp: 90 tablet, Rfl: 1 .  loratadine (CLARITIN) 10 MG tablet, Take 1 tablet (10 mg total) by mouth daily., Disp: 30 tablet, Rfl: 2 .  LUMIGAN 0.01 % SOLN, , Disp: , Rfl:  .   Multiple Vitamin (MULTIVITAMIN) tablet, Take 1 tablet by mouth daily. Reported on 10/10/2015, Disp: , Rfl:  .  Omega-3 Fatty Acids (FISH OIL CONCENTRATE PO), Take by mouth. Reported on 10/10/2015, Disp: , Rfl:  .  pimecrolimus (ELIDEL) 1 % cream, Apply topically 2 (two) times daily., Disp: 100 g, Rfl: 0 .  rosuvastatin (CRESTOR) 10 MG tablet, Take 1 tablet (10 mg total) by mouth daily., Disp: 90 tablet, Rfl: 1 .  triamcinolone cream (KENALOG) 0.1 %, APPLY TO AFFECTED AREA EXTERNALLY 2 TO 3 TIMES A DAY FOR RASH - not on face, Disp: 90 g, Rfl: 0 .  vitamin C (ASCORBIC ACID) 500 MG tablet, Take 500 mg by mouth daily. Reported on 10/10/2015, Disp: , Rfl:  .  vitamin E 400 UNIT capsule, Take 400 Units by mouth daily. Reported on 10/10/2015, Disp: , Rfl:   Allergies  Allergen Reactions  . Atorvastatin     difficulty concentrating and focusing  . Terbinafine And Related Dermatitis    Rash, itch, skin discoloration     ROS  Constitutional: Negative for fever or weight change.  Respiratory: Negative for cough and shortness of breath.   Cardiovascular: Negative for chest pain or palpitations.  Gastrointestinal: Negative for abdominal pain, no bowel changes.  Musculoskeletal: Negative for gait problem or joint swelling.  Skin: Positive  for rash.  Neurological: Negative for dizziness or headache.  No other specific complaints in a complete review of systems (except as listed in HPI above).  Objective  Vitals:   12/07/16 1000  BP: 128/72  Pulse: 65  Resp: 18  Temp: 98 F (36.7 C)  SpO2: 96%  Weight: 168 lb 8 oz (76.4 kg)  Height: 5\' 2"  (1.575 m)    Body mass index is 30.82 kg/m.  Physical Exam  Constitutional: Patient appears well-developed and well-nourished. Obese  No distress.  HEENT: head atraumatic, normocephalic, pupils equal and reactive to light,  neck supple, throat within normal limits, normal thyroid exam  Cardiovascular: Normal rate, regular rhythm and normal heart  sounds.  No murmur heard. No BLE edema. Pulmonary/Chest: Effort normal and breath sounds normal. No respiratory distress. Abdominal: Soft.  There is no tenderness. Skin: hyperpigmentation neck and some eczematous patches on neck and arm Psychiatric: Patient has a normal mood and affect. behavior is normal. Judgment and thought content normal.  PHQ2/9: Depression screen [facility] 2/9 06/02/2016 01/23/2016 10/10/2015 09/10/2015 07/26/2015  Decreased Interest 0 0 0 0 0  Down, Depressed, Hopeless 0 0 0 0 0  PHQ - 2 Score 0 0 0 0 0     Fall Risk: Fall Risk  06/02/2016 01/23/2016 10/10/2015 09/10/2015 07/26/2015  Falls in the past year? No No No No No  Assessment & Plan  1. Pure hypercholesterolemia  - rosuvastatin (CRESTOR) 10 MG tablet; Take 1 tablet (10 mg total) by mouth daily.  Dispense: 90 tablet; Refill: 1  2. Essential hypertension, benign  - lisinopril (PRINIVIL,ZESTRIL) 20 MG tablet; Take 1 tablet (20 mg total) by mouth daily.  Dispense: 90 tablet; Refill: 1 - diltiazem (CARDIZEM CD) 360 MG 24 hr capsule; Take 1 capsule (360 mg total) by mouth daily.  Dispense: 90 capsule; Refill: 1  3. Eczema of face  - pimecrolimus (ELIDEL) 1 % cream; Apply topically 2 (two) times daily.  Dispense: 100 g; Refill: 0 - loratadine (CLARITIN) 10 MG tablet; Take 1 tablet (10 mg total) by mouth daily.  Dispense: 30 tablet; Refill: 2  4. Gastroesophageal reflux disease without esophagitis  Doing well with life style modification   5. Other eczema  - pimecrolimus (ELIDEL) 1 % cream; Apply topically 2 (two) times daily.  Dispense: 100 g; Refill: 0 - loratadine (CLARITIN) 10 MG tablet; Take 1 tablet (10 mg total) by mouth daily.  Dispense: 30 tablet; Refill: 2 - triamcinolone cream (KENALOG) 0.1 %; APPLY TO AFFECTED AREA EXTERNALLY 2 TO 3 TIMES A DAY FOR RASH - not on face  Dispense: 90 g; Refill: 0  6. Left thyroid nodule  Lost to follow up for repeat US, we will order it today

## 2016-12-08 ENCOUNTER — Telehealth: Payer: Self-pay

## 2016-12-08 NOTE — Telephone Encounter (Signed)
She can use otc hydrocortisone occasionally on her face, not near her eyes because increases chance of cataract.

## 2016-12-08 NOTE — Telephone Encounter (Signed)
Pt called and stated that her co-pay for the face cream you sent for her was too much (elidel)and wanted to know if there is a generic or can you send something else.

## 2016-12-09 NOTE — Telephone Encounter (Signed)
Left pt voicemail

## 2016-12-14 ENCOUNTER — Encounter: Payer: Self-pay | Admitting: Family Medicine

## 2016-12-14 ENCOUNTER — Telehealth: Payer: Self-pay

## 2016-12-14 NOTE — Telephone Encounter (Signed)
Patient was informed via voicemail that she has been scheduled to have her [place] on Friday, December 18, 2016 at 3pm at [facility]. Patient was given the number to scheduling [phone]) in case she needed to switch days or times and she was instructed to arrive 15 mins prior to her appt.

## 2016-12-16 ENCOUNTER — Ambulatory Visit
Admission: RE | Admit: 2016-12-16 | Discharge: 2016-12-16 | Disposition: A | Discharge status: Home / Self Care | Payer: Medicare Other | Source: Ambulatory Visit | Attending: Family Medicine | Admitting: Family Medicine

## 2016-12-16 DIAGNOSIS — E041 Nontoxic single thyroid nodule: Secondary | ICD-10-CM | POA: Insufficient documentation

## 2016-12-17 ENCOUNTER — Encounter: Payer: Self-pay | Admitting: Family Medicine

## 2016-12-18 ENCOUNTER — Ambulatory Visit: Payer: Medicare Other

## 2017-01-06 ENCOUNTER — Other Ambulatory Visit: Payer: Self-pay

## 2017-01-06 DIAGNOSIS — L308 Other specified dermatitis: Secondary | ICD-10-CM

## 2017-01-06 DIAGNOSIS — L309 Dermatitis, unspecified: Secondary | ICD-10-CM

## 2017-01-06 NOTE — Telephone Encounter (Signed)
Patient [facility].

## 2017-01-07 MED ORDER — LORATADINE 10 MG PO TABS: 10.0000 mg | ORAL_TABLET | Freq: Every day | ORAL | 1 refills | Status: DC

## 2017-02-26 ENCOUNTER — Other Ambulatory Visit: Payer: Self-pay | Admitting: Family Medicine

## 2017-02-26 NOTE — Telephone Encounter (Signed)
Patient requesting refill of [facility].  

## 2017-02-26 NOTE — Telephone Encounter (Signed)
Diltiazem was refilled on 12/07/2016 with 90 day supply +1 refill. Too soon for additional refill.

## 2017-03-03 ENCOUNTER — Other Ambulatory Visit: Payer: Self-pay | Admitting: Family Medicine

## 2017-03-03 NOTE — Telephone Encounter (Signed)
Patient requesting refill of [facility].  

## 2017-03-17 ENCOUNTER — Encounter: Payer: Self-pay | Admitting: General Surgery

## 2017-03-17 DIAGNOSIS — M940 Chondrocostal junction syndrome [Tietze]: Secondary | ICD-10-CM | POA: Insufficient documentation

## 2017-05-03 ENCOUNTER — Ambulatory Visit: Payer: Medicare Other | Admitting: Family Medicine

## 2017-06-09 ENCOUNTER — Encounter: Payer: Self-pay | Admitting: Family Medicine

## 2017-06-09 ENCOUNTER — Ambulatory Visit (INDEPENDENT_AMBULATORY_CARE_PROVIDER_SITE_OTHER): Payer: Medicare Other | Admitting: Family Medicine

## 2017-06-09 VITALS — BP 130/56 | HR 68 | Temp 98.2°F | Resp 14 | Ht 60.0 in | Wt 169.1 lb

## 2017-06-09 DIAGNOSIS — L308 Other specified dermatitis: Secondary | ICD-10-CM | POA: Diagnosis not present

## 2017-06-09 DIAGNOSIS — I10 Essential (primary) hypertension: Secondary | ICD-10-CM

## 2017-06-09 DIAGNOSIS — L309 Dermatitis, unspecified: Secondary | ICD-10-CM

## 2017-06-09 DIAGNOSIS — E041 Nontoxic single thyroid nodule: Secondary | ICD-10-CM | POA: Diagnosis not present

## 2017-06-09 DIAGNOSIS — Z131 Encounter for screening for diabetes mellitus: Secondary | ICD-10-CM | POA: Diagnosis not present

## 2017-06-09 DIAGNOSIS — K219 Gastro-esophageal reflux disease without esophagitis: Secondary | ICD-10-CM

## 2017-06-09 DIAGNOSIS — Z0001 Encounter for general adult medical examination with abnormal findings: Secondary | ICD-10-CM

## 2017-06-09 DIAGNOSIS — Z1239 Encounter for other screening for malignant neoplasm of breast: Secondary | ICD-10-CM

## 2017-06-09 DIAGNOSIS — Z0000 Encounter for general adult medical examination without abnormal findings: Secondary | ICD-10-CM

## 2017-06-09 DIAGNOSIS — E7800 Pure hypercholesterolemia, unspecified: Secondary | ICD-10-CM

## 2017-06-09 MED ORDER — PIMECROLIMUS 1 % EX CREA: TOPICAL_CREAM | Freq: Two times a day (BID) | CUTANEOUS | 0 refills | Status: DC

## 2017-06-09 MED ORDER — DILTIAZEM HCL ER COATED BEADS 360 MG PO CP24: 360.0000 mg | ORAL_CAPSULE | Freq: Every day | ORAL | 1 refills | Status: DC

## 2017-06-09 MED ORDER — LISINOPRIL 20 MG PO TABS: 20.0000 mg | ORAL_TABLET | Freq: Every day | ORAL | 1 refills | Status: DC

## 2017-06-09 MED ORDER — ROSUVASTATIN CALCIUM 10 MG PO TABS: 10.0000 mg | ORAL_TABLET | Freq: Every day | ORAL | 1 refills | Status: DC

## 2017-06-09 NOTE — Patient Instructions (Signed)
Preventive Care 70 Years and Older, Female Preventive care refers to lifestyle choices and visits with your health care provider that can promote health and wellness. What does preventive care include?  A yearly physical exam. This is also called an annual well check.  Dental exams once or twice a year.  Routine eye exams. Ask your health care provider how often you should have your eyes checked.  Personal lifestyle choices, including: ? Daily care of your teeth and gums. ? Regular physical activity. ? Eating a healthy diet. ? Avoiding tobacco and drug use. ? Limiting alcohol use. ? Practicing safe sex. ? Taking low-dose aspirin every day. ? Taking vitamin and mineral supplements as recommended by your health care provider. What happens during an annual well check? The services and screenings done by your health care provider during your annual well check will depend on your age, overall health, lifestyle risk factors, and family history of disease. Counseling Your health care provider may ask you questions about your:  Alcohol use.  Tobacco use.  Drug use.  Emotional well-being.  Home and relationship well-being.  Sexual activity.  Eating habits.  History of falls.  Memory and ability to understand (cognition).  Work and work environment.  Reproductive health.  Screening You may have the following tests or measurements:  Height, weight, and BMI.  Blood pressure.  Lipid and cholesterol levels. These may be checked every 5 years, or more frequently if you are over 50 years old.  Skin check.  Lung cancer screening. You may have this screening every year starting at age 55 if you have a 30-pack-year history of smoking and currently smoke or have quit within the past 15 years.  Fecal occult blood test (FOBT) of the stool. You may have this test every year starting at age 50.  Flexible sigmoidoscopy or colonoscopy. You may have a sigmoidoscopy every 5 years or  a colonoscopy every 10 years starting at age 50.  Hepatitis C blood test.  Hepatitis B blood test.  Sexually transmitted disease (STD) testing.  Diabetes screening. This is done by checking your blood sugar (glucose) after you have not eaten for a while (fasting). You may have this done every 1-3 years.  Bone density scan. This is done to screen for osteoporosis. You may have this done starting at age 65.  Mammogram. This may be done every 1-2 years. Talk to your health care provider about how often you should have regular mammograms.  Talk with your health care provider about your test results, treatment options, and if necessary, the need for more tests. Vaccines Your health care provider may recommend certain vaccines, such as:  Influenza vaccine. This is recommended every year.  Tetanus, diphtheria, and acellular pertussis (Tdap, Td) vaccine. You may need a Td booster every 10 years.  Varicella vaccine. You may need this if you have not been vaccinated.  Zoster vaccine. You may need this after age 60.  Measles, mumps, and rubella (MMR) vaccine. You may need at least one dose of MMR if you were born in 1957 or later. You may also need a second dose.  Pneumococcal 13-valent conjugate (PCV13) vaccine. One dose is recommended after age 65.  Pneumococcal polysaccharide (PPSV23) vaccine. One dose is recommended after age 65.  Meningococcal vaccine. You may need this if you have certain conditions.  Hepatitis A vaccine. You may need this if you have certain conditions or if you travel or work in places where you may be exposed to hepatitis   A.  Hepatitis B vaccine. You may need this if you have certain conditions or if you travel or work in places where you may be exposed to hepatitis B.  Haemophilus influenzae type b (Hib) vaccine. You may need this if you have certain conditions.  Talk to your health care provider about which screenings and vaccines you need and how often you  need them. This information is not intended to replace advice given to you by your health care provider. Make sure you discuss any questions you have with your health care provider. Document Released: 06/14/2015 Document Revised: 02/05/2016 Document Reviewed: 03/19/2015 Elsevier Interactive Patient Education  2018 Elsevier Inc.  

## 2017-06-09 NOTE — Progress Notes (Signed)
Patient: Vickie Stewart, Female    DOB: 09/23/1945, 72 y.o.   MRN: [id number]  Visit Date: 06/09/2017  Today's Provider: Loistine Chance, MD   Chief Complaint  Patient presents with  . Annual Exam    Medicare    Subjective:    HPI Vickie Stewart is a 72 y.o. female who presents today for her Subsequent Annual Wellness Visit.  Patient/Caregiver input  GERD: she has noticed worsening of symptoms since she stopped taking PPI, now she has burning sensation that is worse when she drinks coffee or goes to bed, also has to take deep breaths at times, feels like the throat being raw is causing weird sensation when she talks. No nausea or vomiting, no weight loss. She likes spicy food and coffee, but only drinks it in am's  Goiter: last [place] in 2018, discussed that thyroid could be causing some of the symptoms of dysphagia, she will resume Omeprazole and if no resolution of symptoms she will call back for referral to ENT  HTN: bp is at goal, denies chest pain or palpitation  Dyslipidemia: taking statin therapy, no chest pain or palpitation.   Review of Systems  Constitutional: Negative for fever or weight change.  Respiratory: Negative for cough and shortness of breath.   Cardiovascular: Negative for chest pain or palpitations.  Gastrointestinal: Negative for abdominal pain, no bowel changes.  Musculoskeletal: Negative for gait problem or joint swelling.  Skin: Negative for rash.  Neurological: Negative for dizziness or headache.  No other specific complaints in a complete review of systems (except as listed in HPI above).  Past Medical History:  Diagnosis Date  . Allergic rhinitis, cause unspecified   . Anxiety state, unspecified   . Contact dermatitis and other eczema, due to unspecified cause   . Diffuse cystic mastopathy   . Dyspepsia and other specified disorders of function of stomach   . Essential hypertension, benign   . Glaucoma   . Irritable bowel syndrome    . Lateral epicondylitis  of elbow   . Leukocytopenia, unspecified   . Mastodynia   . Obesity, unspecified   . Pure hypercholesterolemia   . Reflux esophagitis   . Thoracic or lumbosacral neuritis or radiculitis, unspecified   . Unspecified disorder of skin and subcutaneous tissue   . Vaginal cancer [facility])    History    Past Surgical History:  Procedure Laterality Date  . BREAST EXCISIONAL BIOPSY Left    neg  . BREAST LUMPECTOMY Left   . COLONOSCOPY  08/26/2006  . COLONOSCOPY WITH PROPOFOL N/A 10/21/2016   Procedure: COLONOSCOPY WITH PROPOFOL;  Surgeon: Christene Lye, MD;  Location: [facility] ENDOSCOPY;  Service: Endoscopy;  Laterality: N/A;    Family History  Problem Relation Age of Onset  . Hypertension Father     Social History   Socioeconomic History  . Marital status: Widowed    Spouse name: Not on file  . Number of children: Not on file  . Years of education: Not on file  . Highest education level: Some college, no degree  Social Needs  . Financial resource strain: Not hard at all  . Food insecurity - worry: Never true  . Food insecurity - inability: Never true  . Transportation needs - medical: No  . Transportation needs - non-medical: No  Occupational History  . Occupation: Radio producer: [employer]  Tobacco Use  . Smoking status: Never Smoker  . Smokeless tobacco: Never Used  . Tobacco comment:  experimented with dip snuff as a child  Substance and Sexual Activity  . Alcohol use: No  . Drug use: No  . Sexual activity: Not Currently    Birth control/protection: Post-menopausal  Other Topics Concern  . Not on file  Social History Narrative  . Not on file    Outpatient Encounter Medications as of 06/09/2017  Medication Sig Note  . aspirin 81 MG tablet Take 81 mg by mouth daily. Reported on 10/10/2015   . cholecalciferol (VITAMIN D) 1000 units tablet Take 1,000 Units by mouth daily.   . clobetasol ointment (TEMOVATE) 5.18 % Apply 1 application  topically 2 (two) times daily. 2 x daily for 2 weeks then once a day for 4 weeks (Patient not taking: Reported on 10/21/2016)   . diltiazem (CARDIZEM CD) 360 MG 24 hr capsule Take 1 capsule (360 mg total) by mouth daily.   Marland Kitchen lisinopril (PRINIVIL,ZESTRIL) 20 MG tablet Take 1 tablet (20 mg total) by mouth daily.   Marland Kitchen loratadine (CLARITIN) 10 MG tablet Take 1 tablet (10 mg total) by mouth daily.   Marland Kitchen LUMIGAN 0.01 % SOLN  03/18/2016: Received from: External Pharmacy  . Multiple Vitamin (MULTIVITAMIN) tablet Take 1 tablet by mouth daily. Reported on 10/10/2015   . Omega-3 Fatty Acids (FISH OIL CONCENTRATE PO) Take by mouth. Reported on 10/10/2015   . pimecrolimus (ELIDEL) 1 % cream Apply topically 2 (two) times daily.   . rosuvastatin (CRESTOR) 10 MG tablet Take 1 tablet (10 mg total) by mouth daily.   Marland Kitchen triamcinolone cream (KENALOG) 0.1 % APPLY TO AFFECTED AREA EXTERNALLY 2 TO 3 TIMES A DAY FOR RASH - not on face   . vitamin C (ASCORBIC ACID) 500 MG tablet Take 500 mg by mouth daily. Reported on 10/10/2015   . vitamin E 400 UNIT capsule Take 400 Units by mouth daily. Reported on 10/10/2015   . [DISCONTINUED] diltiazem (CARDIZEM CD) 360 MG 24 hr capsule TAKE 1 CAPSULE (360 MG TOTAL) BY MOUTH DAILY.   . [DISCONTINUED] lisinopril (PRINIVIL,ZESTRIL) 20 MG tablet Take 1 tablet (20 mg total) by mouth daily.   . [DISCONTINUED] pimecrolimus (ELIDEL) 1 % cream Apply topically 2 (two) times daily.   . [DISCONTINUED] rosuvastatin (CRESTOR) 10 MG tablet Take 1 tablet (10 mg total) by mouth daily.    No facility-administered encounter medications on file as of 06/09/2017.     Allergies  Allergen Reactions  . Atorvastatin     difficulty concentrating and focusing  . Terbinafine And Related Dermatitis    Rash, itch, skin discoloration    Care Team Updated in EHR: Yes  Last Vision Exam: February 03, 2017  Wears corrective lenses: Yes   Last Dental Exam: June 2018  Last Hearing Exam: None indicated  Wears  Hearing Aids: No  Functional Ability / Safety Screening 1.  Was the timed Get Up and Go test shorter than 30 seconds?  yes   2.  Does the patient need help with the phone, transportation, shopping,      preparing meals, housework, laundry, medications, or managing money?  no   3.  Is the patient's home free of loose throw rugs in walkways, pet beds, electrical cords, etc?   yes        Grab bars in the bathroom? no       Handrails on the stairs?   yes       Adequate lighting?   yes   4.  Has the patient noticed any hearing difficulties?  no  Diet Recall and Exercise Regimen:  She does not exercise regularly. She continues to work at 72 years old and works in her yard.  Advanced Care Planning: A voluntary discussion about advance care planning including the explanation and discussion of advance directives.  Discussed health care proxy and Living will, and the patient was able to identify a health care proxy as brother Glynn Octave)   Patient does  have a living will at present time. If patient does have living will, I have requested they bring this to the clinic to be scanned in to their chart. Does patient have a HCPOA?    no If yes, name and contact information:  Does patient have a living will or MOST form?  yes  Cancer Screenings:  Skin: None indicated this year  Lung: Not Applicable- Never Smoker Low Dose CT  Chest recommended if Age 25-80 years, 30 pack-year currently smoking OR have quit w/in 15years. Patient does not qualify.  Breast:  Up to date on Mammogram? Yes  08/03/2016  Bone Density/Dexa? Yes 06/13/2014  Colon? Yes 10/21/2016  Additional Screenings:  Hepatitis B/HIV/Syphillis: None Indicated Hepatitis C Screening: 04/26/2013 Intimate Partner Violence: None  Objective:   Vitals: BP (!) 130/56 (BP Location: Right Arm, Patient Position: Sitting, Cuff Size: Normal)   Pulse 68   Temp 98.2 F (36.8 C) (Oral)   Resp 14   Ht 5' (1.524 m)   Wt 169 lb 1.6  oz (76.7 kg)   SpO2 96%   BMI 33.03 kg/m  Body mass index is 33.03 kg/m.  No exam data present  Physical Exam Constitutional: Patient appears well-developed and well-nourished. Obese No distress.  HEENT: head atraumatic, normocephalic, pupils equal and reactive to light,  neck supple, throat within normal limits, enlarged thyroid Cardiovascular: Normal rate, regular rhythm and normal heart sounds.  No murmur heard. No BLE edema. Pulmonary/Chest: Effort normal and breath sounds normal. No respiratory distress. Abdominal: Soft.  There is no tenderness. Psychiatric: Patient has a normal mood and affect. behavior is normal. Judgment and thought content normal.  Cognitive Testing - 6-CIT  Correct? Score   What year is it? yes 0 Yes = 0    No = 4  What month is it? yes 0 Yes = 0    No = 3  Remember:     [address]ia [address], [place], [place]     What time is it? yes 0 Yes = 0    No = 3  Count backwards from 20 to 1 yes 0 Correct = 0    1 error = 2   More than 1 error = 4  Say the months of the year in reverse. yes 0 Correct = 0    1 error = 2   More than 1 error = 4  What address did I ask you to remember? yes 0 Correct = 0  1 error = 2    2 error = 4    3 error = 6    4 error = 8    All wrong = 10       TOTAL SCORE  0/28   Interpretation:  Normal  Normal (0-7) Abnormal (8-28)   Fall Risk: Fall Risk  06/09/2017 06/02/2016 01/23/2016 10/10/2015 09/10/2015  Falls in the past year? No No No No No    Depression Screen Depression screen [facility] 2/9 06/09/2017 06/02/2016 01/23/2016 10/10/2015 09/10/2015  Decreased Interest 0 0 0 0 0  Down, Depressed,  Hopeless 0 0 0 0 0  PHQ - 2 Score 0 0 0 0 0    No results found for this or any previous visit (from the past 2160 hour(s)).  Assessment & Plan:    1. Medicare annual wellness visit, subsequent  Discussed importance of 150 minutes of physical activity weekly, eat two servings of fish weekly, eat one serving of tree nuts ( cashews, pistachios, pecans,  almonds.Marland Kitchen) every other day, eat 6 servings of fruit/vegetables daily and drink plenty of water and avoid sweet beverages.   2. Essential hypertension, benign  - diltiazem (CARDIZEM CD) 360 MG 24 hr capsule; Take 1 capsule (360 mg total) by mouth daily.  Dispense: 90 capsule; Refill: 1 - lisinopril (PRINIVIL,ZESTRIL) 20 MG tablet; Take 1 tablet (20 mg total) by mouth daily.  Dispense: 90 tablet; Refill: 1 - Comprehensive metabolic panel - CBC with Differential/Platelet - EKG - normal sinus rhythm   3. Pure hypercholesterolemia  - rosuvastatin (CRESTOR) 10 MG tablet; Take 1 tablet (10 mg total) by mouth daily.  Dispense: 90 tablet; Refill: 1 - Lipid panel  4. Eczema of face  - pimecrolimus (ELIDEL) 1 % cream; Apply topically 2 (two) times daily.  Dispense: 100 g; Refill: 0  5. Other eczema  - pimecrolimus (ELIDEL) 1 % cream; Apply topically 2 (two) times daily.  Dispense: 100 g; Refill: 0  6. Diabetes mellitus screening  - Hemoglobin A1c  7. Encounter for breast cancer screening other than mammogram  - MM DIGITAL SCREENING BILATERAL; Future  8. Gastroesophageal reflux disease without esophagitis  She is having worsening of symptoms and she will re-start Omeprazole and change diet  9. Left thyroid nodule  Seen by Endo, last US showed goiter  - Discussed health benefits of physical activity, and encouraged her to engage in regular exercise appropriate for her age and condition.   Immunization History  Administered Date(s) Administered  . Influenza Split 07/23/2008, 03/06/2010  . Influenza, Seasonal, Injecte, Preservative Fre 02/26/2011  . Influenza,inj,Quad PF,6+ Mos 02/14/2015  . Influenza-Unspecified 02/26/2011, 01/11/2016, 02/13/2017  . Pneumococcal Conjugate-13 07/26/2015  . Pneumococcal Polysaccharide-23 04/12/2007, 06/19/2013  . Tdap 02/26/2011  . Zoster 02/26/2011    Health Maintenance  Topic Date Due  . MAMMOGRAM  08/04/2018  . TETANUS/TDAP  02/25/2021  .  COLONOSCOPY  10/22/2026  . INFLUENZA VACCINE  Completed  . DEXA SCAN  Completed  . Hepatitis C Screening  Completed  . PNA vac Low Risk Adult  Completed    Meds ordered this encounter  Medications  . diltiazem (CARDIZEM CD) 360 MG 24 hr capsule    Sig: Take 1 capsule (360 mg total) by mouth daily.    Dispense:  90 capsule    Refill:  1  . lisinopril (PRINIVIL,ZESTRIL) 20 MG tablet    Sig: Take 1 tablet (20 mg total) by mouth daily.    Dispense:  90 tablet    Refill:  1  . rosuvastatin (CRESTOR) 10 MG tablet    Sig: Take 1 tablet (10 mg total) by mouth daily.    Dispense:  90 tablet    Refill:  1  . pimecrolimus (ELIDEL) 1 % cream    Sig: Apply topically 2 (two) times daily.    Dispense:  100 g    Refill:  0    Current Outpatient Medications:  .  aspirin 81 MG tablet, Take 81 mg by mouth daily. Reported on 10/10/2015, Disp: , Rfl:  .  cholecalciferol (VITAMIN D) 1000 units tablet, Take  1,000 Units by mouth daily., Disp: , Rfl:  .  clobetasol ointment (TEMOVATE) 6.43 %, Apply 1 application topically 2 (two) times daily. 2 x daily for 2 weeks then once a day for 4 weeks (Patient not taking: Reported on 10/21/2016), Disp: 30 g, Rfl: 3 .  diltiazem (CARDIZEM CD) 360 MG 24 hr capsule, Take 1 capsule (360 mg total) by mouth daily., Disp: 90 capsule, Rfl: 1 .  lisinopril (PRINIVIL,ZESTRIL) 20 MG tablet, Take 1 tablet (20 mg total) by mouth daily., Disp: 90 tablet, Rfl: 1 .  loratadine (CLARITIN) 10 MG tablet, Take 1 tablet (10 mg total) by mouth daily., Disp: 90 tablet, Rfl: 1 .  LUMIGAN 0.01 % SOLN, , Disp: , Rfl:  .  Multiple Vitamin (MULTIVITAMIN) tablet, Take 1 tablet by mouth daily. Reported on 10/10/2015, Disp: , Rfl:  .  Omega-3 Fatty Acids (FISH OIL CONCENTRATE PO), Take by mouth. Reported on 10/10/2015, Disp: , Rfl:  .  pimecrolimus (ELIDEL) 1 % cream, Apply topically 2 (two) times daily., Disp: 100 g, Rfl: 0 .  rosuvastatin (CRESTOR) 10 MG tablet, Take 1 tablet (10 mg total) by  mouth daily., Disp: 90 tablet, Rfl: 1 .  triamcinolone cream (KENALOG) 0.1 %, APPLY TO AFFECTED AREA EXTERNALLY 2 TO 3 TIMES A DAY FOR RASH - not on face, Disp: 90 g, Rfl: 0 .  vitamin C (ASCORBIC ACID) 500 MG tablet, Take 500 mg by mouth daily. Reported on 10/10/2015, Disp: , Rfl:  .  vitamin E 400 UNIT capsule, Take 400 Units by mouth daily. Reported on 10/10/2015, Disp: , Rfl:  Medications Discontinued During This Encounter  Medication Reason  . diltiazem (CARDIZEM CD) 360 MG 24 hr capsule Reorder  . lisinopril (PRINIVIL,ZESTRIL) 20 MG tablet Reorder  . rosuvastatin (CRESTOR) 10 MG tablet Reorder  . pimecrolimus (ELIDEL) 1 % cream Reorder    I have personally reviewed and addressed the Medicare Annual Wellness health risk assessment questionnaire and have noted the following in the patient's chart:  A.         Medical and social history & family history B.         Use of alcohol, tobacco, and illicit drugs  C.         Current medications and supplements D.         Functional and Cognitive ability and status E.         Nutritional status F.         Physical activity G.        Advance directives H.         List of other physicians I.          Hospitalizations, surgeries, and ER visits in previous 12 months J.         [place] such as hearing, vision, cognitive function, and depression L.         Referrals and appointments:   In addition, I have reviewed and discussed with patient certain preventive protocols, quality metrics, and best practice recommendations. A written personalized care plan for preventive services as well as general preventive health recommendations were provided to patient.  See attached scanned questionnaire for additional information.   Return for CPE and also follow up in 6 months

## 2017-07-09 ENCOUNTER — Telehealth: Payer: Self-pay

## 2017-07-09 LAB — COMPREHENSIVE METABOLIC PANEL
AG RATIO: 1.8 (calc) (ref 1.0–2.5)
ALT: 20 U/L (ref 6–29)
AST: 20 U/L (ref 10–35)
Albumin: 4.5 g/dL (ref 3.6–5.1)
Alkaline phosphatase (APISO): 48 U/L (ref 33–130)
BUN: 15 mg/dL (ref 7–25)
CO2: 26 mmol/L (ref 20–32)
Calcium: 9.7 mg/dL (ref 8.6–10.4)
Chloride: 105 mmol/L (ref 98–110)
Creat: 0.69 mg/dL (ref 0.50–0.99)
GLUCOSE: 94 mg/dL (ref 65–99)
Globulin: 2.5 g/dL (calc) (ref 1.9–3.7)
Potassium: 4.5 mmol/L (ref 3.5–5.3)
Sodium: 139 mmol/L (ref 135–146)
TOTAL PROTEIN: 7 g/dL (ref 6.1–8.1)
Total Bilirubin: 0.3 mg/dL (ref 0.2–1.2)

## 2017-07-09 LAB — LIPID PANEL
CHOL/HDL RATIO: 2.4 (calc) (ref <5.0)
Cholesterol: 141 mg/dL (ref <200)
HDL: 59 mg/dL (ref >50)
LDL Cholesterol (Calc): 67 mg/dL (calc)
NON-HDL CHOLESTEROL (CALC): 82 mg/dL (ref <130)
Triglycerides: 70 mg/dL (ref <150)

## 2017-07-09 LAB — CBC WITH DIFFERENTIAL/PLATELET
BASOS ABS: 48 {cells}/uL (ref 0–200)
Basophils Relative: 1.5 %
EOS ABS: 170 {cells}/uL (ref 15–500)
Eosinophils Relative: 5.3 %
HEMATOCRIT: 42.5 % (ref 35.0–45.0)
HEMOGLOBIN: 14.6 g/dL (ref 11.7–15.5)
LYMPHS ABS: 1645 {cells}/uL (ref 850–3900)
MCH: 29.9 pg (ref 27.0–33.0)
MCHC: 34.4 g/dL (ref 32.0–36.0)
MCV: 86.9 fL (ref 80.0–100.0)
MPV: 11.6 fL (ref 7.5–12.5)
Monocytes Relative: 9 %
NEUTROS ABS: 1050 {cells}/uL — AB (ref 1500–7800)
Neutrophils Relative %: 32.8 %
Platelets: 232 10*3/uL (ref 140–400)
RBC: 4.89 10*6/uL (ref 3.80–5.10)
RDW: 12.8 % (ref 11.0–15.0)
Total Lymphocyte: 51.4 %
WBC mixed population: 288 cells/uL (ref 200–950)
WBC: 3.2 10*3/uL — ABNORMAL LOW (ref 3.8–10.8)

## 2017-07-09 NOTE — Telephone Encounter (Signed)
Insurance would not cover A1C with her all her existing diagnosis codes. Per Dr. Ancil Boozer ok to remove A1C due to her glucose and A1C being normal in the past. I called the patient on her house phone and informed her of this on her voicemail. Since she was not available and since she had stated she did not want to have A1C done if it was not covered with the lab tech.

## 2017-07-12 ENCOUNTER — Telehealth: Payer: Self-pay | Admitting: Family Medicine

## 2017-07-12 NOTE — Telephone Encounter (Signed)
Pt given lab results per notes of Dr. Ancil Boozer on 07/09/17. Pt verbalized understanding. Unable to record in results note as it was not forwarded to [facility].

## 2017-07-12 NOTE — Telephone Encounter (Signed)
Okay to add

## 2017-07-12 NOTE — Telephone Encounter (Signed)
Copied from [place]. Topic: General - Other >> Jul 12, 2017  3:13 PM Vickie Stewart, NT wrote: Reason for CRM: Patient said she spoke with her insurance company and they told her that  to get her a1c checked would be  covered by the insurance ,she would like to know why she was told it was not covered , please call  [phone] pt says to please call tomorrow after 3 or 4 pm

## 2017-07-13 NOTE — Telephone Encounter (Signed)
Called patient and left a message if she would like to add back on the A1C to her existing blood work. If so she would have to come sign the waiver for the A1C. If the test if cover she should not be charge but it is waiving in our system, if her insurance does not cover the test it will be $74 dollars. Just asked patient to call us back if she would like [place] to add this on.

## 2017-07-27 ENCOUNTER — Ambulatory Visit (INDEPENDENT_AMBULATORY_CARE_PROVIDER_SITE_OTHER): Payer: Medicare Other | Admitting: Family Medicine

## 2017-07-27 ENCOUNTER — Encounter: Payer: Self-pay | Admitting: Family Medicine

## 2017-07-27 VITALS — BP 114/60 | HR 63 | Temp 98.3°F | Resp 14 | Ht 61.0 in | Wt 170.0 lb

## 2017-07-27 DIAGNOSIS — Z8544 Personal history of malignant neoplasm of other female genital organs: Secondary | ICD-10-CM | POA: Diagnosis not present

## 2017-07-27 DIAGNOSIS — Z01419 Encounter for gynecological examination (general) (routine) without abnormal findings: Secondary | ICD-10-CM

## 2017-07-27 DIAGNOSIS — M85851 Other specified disorders of bone density and structure, right thigh: Secondary | ICD-10-CM

## 2017-07-27 NOTE — Patient Instructions (Signed)
Preventive Care 70 Years and Older, Female Preventive care refers to lifestyle choices and visits with your health care provider that can promote health and wellness. What does preventive care include?  A yearly physical exam. This is also called an annual well check.  Dental exams once or twice a year.  Routine eye exams. Ask your health care provider how often you should have your eyes checked.  Personal lifestyle choices, including: ? Daily care of your teeth and gums. ? Regular physical activity. ? Eating a healthy diet. ? Avoiding tobacco and drug use. ? Limiting alcohol use. ? Practicing safe sex. ? Taking low-dose aspirin every day. ? Taking vitamin and mineral supplements as recommended by your health care provider. What happens during an annual well check? The services and screenings done by your health care provider during your annual well check will depend on your age, overall health, lifestyle risk factors, and family history of disease. Counseling Your health care provider may ask you questions about your:  Alcohol use.  Tobacco use.  Drug use.  Emotional well-being.  Home and relationship well-being.  Sexual activity.  Eating habits.  History of falls.  Memory and ability to understand (cognition).  Work and work environment.  Reproductive health.  Screening You may have the following tests or measurements:  Height, weight, and BMI.  Blood pressure.  Lipid and cholesterol levels. These may be checked every 5 years, or more frequently if you are over 50 years old.  Skin check.  Lung cancer screening. You may have this screening every year starting at age 55 if you have a 30-pack-year history of smoking and currently smoke or have quit within the past 15 years.  Fecal occult blood test (FOBT) of the stool. You may have this test every year starting at age 50.  Flexible sigmoidoscopy or colonoscopy. You may have a sigmoidoscopy every 5 years or  a colonoscopy every 10 years starting at age 50.  Hepatitis C blood test.  Hepatitis B blood test.  Sexually transmitted disease (STD) testing.  Diabetes screening. This is done by checking your blood sugar (glucose) after you have not eaten for a while (fasting). You may have this done every 1-3 years.  Bone density scan. This is done to screen for osteoporosis. You may have this done starting at age 65.  Mammogram. This may be done every 1-2 years. Talk to your health care provider about how often you should have regular mammograms.  Talk with your health care provider about your test results, treatment options, and if necessary, the need for more tests. Vaccines Your health care provider may recommend certain vaccines, such as:  Influenza vaccine. This is recommended every year.  Tetanus, diphtheria, and acellular pertussis (Tdap, Td) vaccine. You may need a Td booster every 10 years.  Varicella vaccine. You may need this if you have not been vaccinated.  Zoster vaccine. You may need this after age 60.  Measles, mumps, and rubella (MMR) vaccine. You may need at least one dose of MMR if you were born in 1957 or later. You may also need a second dose.  Pneumococcal 13-valent conjugate (PCV13) vaccine. One dose is recommended after age 65.  Pneumococcal polysaccharide (PPSV23) vaccine. One dose is recommended after age 65.  Meningococcal vaccine. You may need this if you have certain conditions.  Hepatitis A vaccine. You may need this if you have certain conditions or if you travel or work in places where you may be exposed to hepatitis   A.  Hepatitis B vaccine. You may need this if you have certain conditions or if you travel or work in places where you may be exposed to hepatitis B.  Haemophilus influenzae type b (Hib) vaccine. You may need this if you have certain conditions.  Talk to your health care provider about which screenings and vaccines you need and how often you  need them. This information is not intended to replace advice given to you by your health care provider. Make sure you discuss any questions you have with your health care provider. Document Released: 06/14/2015 Document Revised: 02/05/2016 Document Reviewed: 03/19/2015 Elsevier Interactive Patient Education  2018 Elsevier Inc.  

## 2017-07-27 NOTE — Progress Notes (Signed)
Name: Vickie Stewart   MRN: [id number]    DOB: [birth year]   Date:07/27/2017       Progress Note  Subjective  Chief Complaint  Chief Complaint  Patient presents with  . Annual Exam    HPI   Patient presents for annual CPE   Diet: eating a lot at night, she will try to increase calorie during the day, but trying to eat more fruit and nuts since last visit  Exercise: not currently   USPSTF grade A and B recommendations  Depression:  Depression screen [facility] 2/9 07/27/2017 06/09/2017 06/02/2016 01/23/2016 10/10/2015  Decreased Interest 0 0 0 0 0  Down, Depressed, Hopeless 0 0 0 0 0  PHQ - 2 Score 0 0 0 0 0   Hypertension: BP Readings from Last 3 Encounters:  07/27/17 114/60  06/09/17 (!) 130/56  12/07/16 128/72   Obesity: Wt Readings from Last 3 Encounters:  07/27/17 170 lb (77.1 kg)  06/09/17 169 lb 1.6 oz (76.7 kg)  12/07/16 168 lb 8 oz (76.4 kg)   BMI Readings from Last 3 Encounters:  07/27/17 32.12 kg/m  06/09/17 33.03 kg/m  12/07/16 30.82 kg/m     Sexual History/Pain during Intercourse:not currently  Menstrual History/LMP/Abnormal Bleeding: post-menopausal , no post-menopausal bleeding. Discussed need to be seen if post-menopausal bleeding.  Incontinence Symptoms: no  Advanced Care Planning: A voluntary discussion about advance care planning including the explanation and discussion of advance directives.  Discussed health care proxy and Living will, and the patient was able to identify a health care proxy as Glynn Octave ( brother)  Patient does not have a living will at present time. If patient does have living will, I have requested they bring this to the clinic to be scanned in to their chart.  Breast cancer:  HM Mammogram  Date Value Ref Range Status  06/01/2014 Normal  Final   Cervical cancer screening: due because of personal history of vaginal cancer  Osteoporosis:  HM Dexa Scan  Date Value Ref Range Status  06/01/2014 Osteopenia  Final     Lipids:  Lab Results  Component Value Date   CHOL 141 07/09/2017   CHOL 153 05/27/2016   CHOL 216 (H) 07/26/2015   Lab Results  Component Value Date   HDL 59 07/09/2017   HDL 57 05/27/2016   HDL 54 07/26/2015   Lab Results  Component Value Date   LDLCALC 76 05/27/2016   LDLCALC 140 (H) 07/26/2015   LDLCALC 142 (H) 02/18/2015   Lab Results  Component Value Date   TRIG 70 07/09/2017   TRIG 100 05/27/2016   TRIG 108 07/26/2015   Lab Results  Component Value Date   CHOLHDL 2.4 07/09/2017   CHOLHDL 4.0 07/26/2015   CHOLHDL 4.0 02/18/2015   No results found for: LDLDIRECT  Glucose:  Glucose  Date Value Ref Range Status  05/27/2016 85 65 - 99 mg/dL Final  07/26/2015 87 65 - 99 mg/dL Final  02/18/2015 89 65 - 99 mg/dL Final   Glucose, Bld  Date Value Ref Range Status  07/09/2017 94 65 - 99 mg/dL Final    Comment:    .            Fasting reference interval .      Aspirin: yes ECG:06/2017   Patient Active Problem List   Diagnosis Date Noted  . Lichen sclerosus 56/81/2751  . Vaginal atrophy 03/18/2016  . Encounter for follow-up surveillance of vaginal cancer 03/18/2016  . Eczema 10/31/2015  .  Left hand pain 09/10/2015  . Multinodular goiter 07/26/2015  . Osteopenia 07/26/2015  . Menopause 07/26/2015  . Irritable colon 07/26/2015  . History of cancer of vagina 07/26/2015  . Diffuse cystic mastopathy 07/26/2015  . Obesity (BMI 30.0-34.9) 07/26/2015  . Intermittent low back pain 07/26/2015  . Hyperlipemia 07/26/2015  . Osteoarthritis 05/13/2015  . Allergic rhinitis 05/13/2015  . GERD (gastroesophageal reflux disease) 05/13/2015  . Essential hypertension, benign     Past Surgical History:  Procedure Laterality Date  . BREAST EXCISIONAL BIOPSY Left    neg  . BREAST LUMPECTOMY Left   . COLONOSCOPY  08/26/2006  . COLONOSCOPY WITH PROPOFOL N/A 10/21/2016   Procedure: COLONOSCOPY WITH PROPOFOL;  Surgeon: Christene Lye, MD;  Location: [facility]  ENDOSCOPY;  Service: Endoscopy;  Laterality: N/A;    Family History  Problem Relation Age of Onset  . Hypertension Father     Social History   Socioeconomic History  . Marital status: Widowed    Spouse name: Not on file  . Number of children: Not on file  . Years of education: Not on file  . Highest education level: Some college, no degree  Social Needs  . Financial resource strain: Not hard at all  . Food insecurity - worry: Never true  . Food insecurity - inability: Never true  . Transportation needs - medical: No  . Transportation needs - non-medical: No  Occupational History  . Occupation: Radio producer: [employer]  Tobacco Use  . Smoking status: Never Smoker  . Smokeless tobacco: Never Used  . Tobacco comment: experimented with dip snuff as a child  Substance and Sexual Activity  . Alcohol use: No  . Drug use: No  . Sexual activity: Not Currently    Birth control/protection: Post-menopausal  Other Topics Concern  . Not on file  Social History Narrative  . Not on file     Current Outpatient Medications:  .  aspirin 81 MG tablet, Take 81 mg by mouth daily. Reported on 10/10/2015, Disp: , Rfl:  .  cholecalciferol (VITAMIN D) 1000 units tablet, Take 1,000 Units by mouth daily., Disp: , Rfl:  .  clobetasol ointment (TEMOVATE) 9.79 %, Apply 1 application topically 2 (two) times daily. 2 x daily for 2 weeks then once a day for 4 weeks, Disp: 30 g, Rfl: 3 .  diltiazem (CARDIZEM CD) 360 MG 24 hr capsule, Take 1 capsule (360 mg total) by mouth daily., Disp: 90 capsule, Rfl: 1 .  latanoprost (XALATAN) 0.005 % ophthalmic solution, PLACE 1 DROP IN BOTH EYES AT BEDTIME, Disp: , Rfl: 4 .  lisinopril (PRINIVIL,ZESTRIL) 20 MG tablet, Take 1 tablet (20 mg total) by mouth daily., Disp: 90 tablet, Rfl: 1 .  loratadine (CLARITIN) 10 MG tablet, Take 1 tablet (10 mg total) by mouth daily., Disp: 90 tablet, Rfl: 1 .  LUMIGAN 0.01 % SOLN, , Disp: , Rfl:  .  Multiple Vitamin  (MULTIVITAMIN) tablet, Take 1 tablet by mouth daily. Reported on 10/10/2015, Disp: , Rfl:  .  Omega-3 Fatty Acids (FISH OIL CONCENTRATE PO), Take by mouth. Reported on 10/10/2015, Disp: , Rfl:  .  pimecrolimus (ELIDEL) 1 % cream, Apply topically 2 (two) times daily., Disp: 100 g, Rfl: 0 .  rosuvastatin (CRESTOR) 10 MG tablet, Take 1 tablet (10 mg total) by mouth daily., Disp: 90 tablet, Rfl: 1 .  triamcinolone cream (KENALOG) 0.1 %, APPLY TO AFFECTED AREA EXTERNALLY 2 TO 3 TIMES A DAY FOR RASH - not  on face, Disp: 90 g, Rfl: 0 .  vitamin C (ASCORBIC ACID) 500 MG tablet, Take 500 mg by mouth daily. Reported on 10/10/2015, Disp: , Rfl:  .  vitamin E 400 UNIT capsule, Take 400 Units by mouth daily. Reported on 10/10/2015, Disp: , Rfl:   Allergies  Allergen Reactions  . Atorvastatin     difficulty concentrating and focusing  . Terbinafine And Related Dermatitis    Rash, itch, skin discoloration     ROS   Constitutional: Negative for fever or weight change.  Respiratory: Negative for cough and shortness of breath.   Cardiovascular: Negative for chest pain or palpitations.  Gastrointestinal: Negative for abdominal pain, no bowel changes.  Musculoskeletal: Negative for gait problem or joint swelling.  Skin: Negative for rash.  Neurological: Negative for dizziness or headache.  No other specific complaints in a complete review of systems (except as listed in HPI above).   Objective  Vitals:   07/27/17 1404  BP: 114/60  Pulse: 63  Resp: 14  Temp: 98.3 F (36.8 C)  TempSrc: Oral  SpO2: 98%  Weight: 170 lb (77.1 kg)  Height: 5\' 1"  (1.549 m)    Body mass index is 32.12 kg/m.  Physical Exam  Constitutional: Patient appears well-developed and well-nourished. No distress.  HENT: Head: Normocephalic and atraumatic. Ears: B TMs ok, no erythema or effusion; Nose: Nose normal. Mouth/Throat: Oropharynx is clear and moist. No oropharyngeal exudate.  Eyes: Conjunctivae and EOM are normal.  Pupils are equal, round, and reactive to light. No scleral icterus.  Neck: Normal range of motion. Neck supple. No JVD present. No thyromegaly present.  Cardiovascular: Normal rate, regular rhythm and normal heart sounds.  No murmur heard. No BLE edema. Pulmonary/Chest: Effort normal and breath sounds normal. No respiratory distress. Abdominal: Soft. Bowel sounds are normal, no distension. There is no tenderness. no masses Breast: no lumps or masses, no nipple discharge or rashes FEMALE GENITALIA:  External genitalia normal External urethra normal Vaginal vault normal without discharge or lesions, atrophy noticed Cervix normal without discharge or lesions Bimanual exam, uterine seems nodular RECTAL: not done  Musculoskeletal: Normal range of motion, no joint effusions. No gross deformities Neurological: he is alert and oriented to person, place, and time. No cranial nerve deficit. Coordination, balance, strength, speech and gait are normal.  Skin: Skin is warm and dry. No rash noted. No erythema.  Psychiatric: Patient has a normal mood and affect. behavior is normal. Judgment and thought content normal.   Recent Results (from the past 2160 hour(s))  Comprehensive metabolic panel     Status: None   Collection Time: 07/09/17  8:33 AM  Result Value Ref Range   Glucose, Bld 94 65 - 99 mg/dL    Comment: .            Fasting reference interval .    BUN 15 7 - 25 mg/dL   Creat 0.69 0.50 - 0.99 mg/dL    Comment: For patients >35 years of age, the reference limit for Creatinine is approximately 13% higher for people identified as African-American. .    BUN/Creatinine Ratio NOT APPLICABLE 6 - 22 (calc)   Sodium 139 135 - 146 mmol/L   Potassium 4.5 3.5 - 5.3 mmol/L   Chloride 105 98 - 110 mmol/L   CO2 26 20 - 32 mmol/L   Calcium 9.7 8.6 - 10.4 mg/dL   Total Protein 7.0 6.1 - 8.1 g/dL   Albumin 4.5 3.6 - 5.1 g/dL   Globulin  2.5 1.9 - 3.7 g/dL (calc)   AG Ratio 1.8 1.0 - 2.5 (calc)    Total Bilirubin 0.3 0.2 - 1.2 mg/dL   Alkaline phosphatase (APISO) 48 33 - 130 U/L   AST 20 10 - 35 U/L   ALT 20 6 - 29 U/L  CBC with Differential/Platelet     Status: Abnormal   Collection Time: 07/09/17  8:33 AM  Result Value Ref Range   WBC 3.2 (L) 3.8 - 10.8 Thousand/uL   RBC 4.89 3.80 - 5.10 Million/uL   Hemoglobin 14.6 11.7 - 15.5 g/dL   HCT 42.5 35.0 - 45.0 %   MCV 86.9 80.0 - 100.0 fL   MCH 29.9 27.0 - 33.0 pg   MCHC 34.4 32.0 - 36.0 g/dL   RDW 12.8 11.0 - 15.0 %   Platelets 232 140 - 400 Thousand/uL   MPV 11.6 7.5 - 12.5 fL   Neutro Abs 1,050 (L) 1,500 - 7,800 cells/uL   Lymphs Abs 1,645 850 - 3,900 cells/uL   WBC mixed population 288 200 - 950 cells/uL   Eosinophils Absolute 170 15 - 500 cells/uL   Basophils Absolute 48 0 - 200 cells/uL   Neutrophils Relative % 32.8 %   Total Lymphocyte 51.4 %   Monocytes Relative 9.0 %   Eosinophils Relative 5.3 %   Basophils Relative 1.5 %  Lipid panel     Status: None   Collection Time: 07/09/17  8:33 AM  Result Value Ref Range   Cholesterol 141 <200 mg/dL   HDL 59 >50 mg/dL   Triglycerides 70 <150 mg/dL   LDL Cholesterol (Calc) 67 mg/dL (calc)    Comment: Reference range: <100 . Desirable range <100 mg/dL for primary prevention;   <70 mg/dL for patients with CHD or diabetic patients  with > or = 2 CHD risk factors. Marland Kitchen LDL-C is now calculated using the Martin-Hopkins  calculation, which is a validated novel method providing  better accuracy than the Friedewald equation in the  estimation of LDL-C.  Cresenciano Genre et al. Annamaria Helling. 3710;626(94): 2061-2068  (http://education.[facility].com/faq/FAQ164)    Total CHOL/HDL Ratio 2.4 <5.0 (calc)   Non-HDL Cholesterol (Calc) 82 <130 mg/dL (calc)    Comment: For patients with diabetes plus 1 major ASCVD risk  factor, treating to a non-HDL-C goal of <100 mg/dL  (LDL-C of <70 mg/dL) is considered a therapeutic  option.      PHQ2/9: Depression screen [facility] 2/9 07/27/2017 06/09/2017  06/02/2016 01/23/2016 10/10/2015  Decreased Interest 0 0 0 0 0  Down, Depressed, Hopeless 0 0 0 0 0  PHQ - 2 Score 0 0 0 0 0    Fall Risk: Fall Risk  07/27/2017 06/09/2017 06/02/2016 01/23/2016 10/10/2015  Falls in the past year? No No No No No     Functional Status Survey: Is the patient deaf or have difficulty hearing?: No Does the patient have difficulty seeing, even when wearing glasses/contacts?: No Does the patient have difficulty concentrating, remembering, or making decisions?: No Does the patient have difficulty walking or climbing stairs?: No Does the patient have difficulty dressing or bathing?: No Does the patient have difficulty doing errands alone such as visiting a doctor's office or shopping?: No   Assessment & Plan  1. Well woman exam  Discussed importance of 150 minutes of physical activity weekly, eat two servings of fish weekly, eat one serving of tree nuts ( cashews, pistachios, pecans, almonds.Marland Kitchen) every other day, eat 6 servings of fruit/vegetables daily and drink plenty of water  and avoid sweet beverages.   2. History of cancer of vagina  - vaginal pap , also cervical

## 2017-07-30 LAB — PAP IG AND HPV HIGH-RISK: HPV DNA High Risk: NOT DETECTED

## 2017-08-06 ENCOUNTER — Ambulatory Visit
Admission: RE | Admit: 2017-08-06 | Discharge: 2017-08-06 | Disposition: A | Discharge status: Home / Self Care | Payer: Medicare Other | Source: Ambulatory Visit | Attending: Family Medicine | Admitting: Family Medicine

## 2017-08-06 DIAGNOSIS — Z1231 Encounter for screening mammogram for malignant neoplasm of breast: Secondary | ICD-10-CM | POA: Diagnosis not present

## 2017-08-06 DIAGNOSIS — Z1239 Encounter for other screening for malignant neoplasm of breast: Secondary | ICD-10-CM

## 2017-08-26 ENCOUNTER — Ambulatory Visit
Admission: RE | Admit: 2017-08-26 | Discharge: 2017-08-26 | Disposition: A | Discharge status: Home / Self Care | Payer: Medicare Other | Source: Ambulatory Visit | Attending: Family Medicine | Admitting: Family Medicine

## 2017-08-26 DIAGNOSIS — M85851 Other specified disorders of bone density and structure, right thigh: Secondary | ICD-10-CM | POA: Insufficient documentation

## 2017-08-27 ENCOUNTER — Telehealth: Payer: Self-pay | Admitting: Family Medicine

## 2017-08-27 NOTE — Telephone Encounter (Signed)
Lab not in basket

## 2017-08-27 NOTE — Telephone Encounter (Signed)
Patient is calling for her Bone density results- reviewed results and recommendations.

## 2017-09-05 ENCOUNTER — Other Ambulatory Visit: Payer: Self-pay | Admitting: Family Medicine

## 2017-09-25 ENCOUNTER — Encounter: Payer: Self-pay | Admitting: Emergency Medicine

## 2017-09-25 ENCOUNTER — Emergency Department
Admission: EM | Admit: 2017-09-25 | Discharge: 2017-09-25 | Disposition: A | Discharge status: Home / Self Care | Payer: Medicare Other | Attending: Emergency Medicine | Admitting: Emergency Medicine

## 2017-09-25 ENCOUNTER — Other Ambulatory Visit: Payer: Self-pay

## 2017-09-25 ENCOUNTER — Emergency Department: Payer: Medicare Other

## 2017-09-25 DIAGNOSIS — Z7982 Long term (current) use of aspirin: Secondary | ICD-10-CM | POA: Diagnosis not present

## 2017-09-25 DIAGNOSIS — I10 Essential (primary) hypertension: Secondary | ICD-10-CM | POA: Diagnosis not present

## 2017-09-25 DIAGNOSIS — R42 Dizziness and giddiness: Secondary | ICD-10-CM

## 2017-09-25 DIAGNOSIS — Z79899 Other long term (current) drug therapy: Secondary | ICD-10-CM | POA: Insufficient documentation

## 2017-09-25 DIAGNOSIS — Z8544 Personal history of malignant neoplasm of other female genital organs: Secondary | ICD-10-CM | POA: Diagnosis not present

## 2017-09-25 LAB — CBC
HEMATOCRIT: 46 % (ref 35.0–47.0)
Hemoglobin: 15.6 g/dL (ref 12.0–16.0)
MCH: 29.8 pg (ref 26.0–34.0)
MCHC: 33.9 g/dL (ref 32.0–36.0)
MCV: 87.9 fL (ref 80.0–100.0)
Platelets: 230 10*3/uL (ref 150–440)
RBC: 5.23 MIL/uL — ABNORMAL HIGH (ref 3.80–5.20)
RDW: 13.7 % (ref 11.5–14.5)
WBC: 4 10*3/uL (ref 3.6–11.0)

## 2017-09-25 LAB — BASIC METABOLIC PANEL
Anion gap: 11 (ref 5–15)
BUN: 16 mg/dL (ref 6–20)
CHLORIDE: 106 mmol/L (ref 101–111)
CO2: 23 mmol/L (ref 22–32)
Calcium: 9.3 mg/dL (ref 8.9–10.3)
Creatinine, Ser: 0.65 mg/dL (ref 0.44–1.00)
GFR calc Af Amer: >60 mL/min (ref >60)
GFR calc non Af Amer: >60 mL/min (ref >60)
GLUCOSE: 102 mg/dL — AB (ref 65–99)
POTASSIUM: 3.7 mmol/L (ref 3.5–5.1)
Sodium: 140 mmol/L (ref 135–145)

## 2017-09-25 LAB — TROPONIN I: Troponin I: <0.03 ng/mL (ref <0.03)

## 2017-09-25 MED ORDER — GADOBENATE DIMEGLUMINE 529 MG/ML IV SOLN
15.0000 mL | Freq: Once | INTRAVENOUS | Status: AC | PRN
  Administered 2017-09-25: 15 mL via INTRAVENOUS

## 2017-09-25 MED ORDER — MECLIZINE HCL 25 MG PO TABS
ORAL_TABLET | ORAL | Status: AC
  Administered 2017-09-25: 25 mg via ORAL
  Filled 2017-09-25: qty 1

## 2017-09-25 MED ORDER — MECLIZINE HCL 25 MG PO TABS
25.0000 mg | ORAL_TABLET | Freq: Once | ORAL | Status: AC
  Administered 2017-09-25: 25 mg via ORAL

## 2017-09-25 MED ORDER — LORAZEPAM 1 MG PO TABS: 1.0000 mg | ORAL_TABLET | Freq: Two times a day (BID) | ORAL | 0 refills | Status: AC

## 2017-09-25 MED ORDER — LORAZEPAM 2 MG/ML IJ SOLN
0.5000 mg | Freq: Once | INTRAMUSCULAR | Status: AC
Start: 2017-09-25 — End: 2017-09-25
  Administered 2017-09-25: 0.5 mg via INTRAVENOUS

## 2017-09-25 NOTE — ED Notes (Signed)
First Nurse note: Pt states upon standing this am, she felt like the room was spinning.  Denies weakness on one side of body, speech clear, face symmetrical.

## 2017-09-25 NOTE — ED Triage Notes (Signed)
Dizziness since yesterday. Smile symmetrical, grips and leg strength equal.

## 2017-09-25 NOTE — ED Provider Notes (Addendum)
[facility] Emergency Department Provider Note   ____________________________________________   First MD Initiated Contact with Patient 09/25/17 [phone]     (approximate)  I have reviewed the triage vital signs and the nursing notes.   HISTORY  Chief Complaint Dizziness    HPI Vickie Stewart is a 72 y.o. female who reports she woke up this morning and felt the room spinning.  She has not had any nausea or vomiting with this.  She is never had this before.  She does have a history of hypertension high cholesterol.  She has no headache with this no numbness or weakness anywhere with it no slurry speech no other problems.  He did have difficulty walking because of the vertigo.   Past Medical History:  Diagnosis Date  . Allergic rhinitis, cause unspecified   . Anxiety state, unspecified   . Contact dermatitis and other eczema, due to unspecified cause   . Diffuse cystic mastopathy   . Dyspepsia and other specified disorders of function of stomach   . Essential hypertension, benign   . Glaucoma   . Irritable bowel syndrome   . Lateral epicondylitis  of elbow   . Leukocytopenia, unspecified   . Mastodynia   . Obesity, unspecified   . Pure hypercholesterolemia   . Reflux esophagitis   . Thoracic or lumbosacral neuritis or radiculitis, unspecified   . Unspecified disorder of skin and subcutaneous tissue   . Vaginal cancer [facility])    History    Patient Active Problem List   Diagnosis Date Noted  . Lichen sclerosus 27/25/3664  . Vaginal atrophy 03/18/2016  . Encounter for follow-up surveillance of vaginal cancer 03/18/2016  . Eczema 10/31/2015  . Left hand pain 09/10/2015  . Multinodular goiter 07/26/2015  . Osteopenia 07/26/2015  . Menopause 07/26/2015  . Irritable colon 07/26/2015  . History of cancer of vagina 07/26/2015  . Diffuse cystic mastopathy 07/26/2015  . Obesity (BMI 30.0-34.9) 07/26/2015  . Intermittent low back pain 07/26/2015   . Hyperlipemia 07/26/2015  . Osteoarthritis 05/13/2015  . Allergic rhinitis 05/13/2015  . GERD (gastroesophageal reflux disease) 05/13/2015  . Essential hypertension, benign     Past Surgical History:  Procedure Laterality Date  . BREAST EXCISIONAL BIOPSY Left    neg  . BREAST LUMPECTOMY Left   . COLONOSCOPY  08/26/2006  . COLONOSCOPY WITH PROPOFOL N/A 10/21/2016   Procedure: COLONOSCOPY WITH PROPOFOL;  Surgeon: Christene Lye, MD;  Location: [facility] ENDOSCOPY;  Service: Endoscopy;  Laterality: N/A;    Prior to Admission medications   Medication Sig Start Date End Date Taking? Authorizing Provider  aspirin 81 MG tablet Take 81 mg by mouth daily. Reported on 10/10/2015    [provider]  cholecalciferol (VITAMIN D) 1000 units tablet Take 1,000 Units by mouth daily.    [provider]  clobetasol ointment (TEMOVATE) 4.03 % Apply 1 application topically 2 (two) times daily. 2 x daily for 2 weeks then once a day for 4 weeks 03/24/16   Defrancesco, Alanda Slim, MD  diltiazem (CARDIZEM CD) 360 MG 24 hr capsule Take 1 capsule (360 mg total) by mouth daily. 06/09/17   Sowles, Drue Stager, MD  latanoprost (XALATAN) 0.005 % ophthalmic solution PLACE 1 DROP IN BOTH EYES AT BEDTIME 06/20/17   [provider]  lisinopril (PRINIVIL,ZESTRIL) 20 MG tablet Take 1 tablet (20 mg total) by mouth daily. 06/09/17   Steele Sizer, MD  loratadine (CLARITIN) 10 MG tablet Take 1 tablet (10 mg total) by  mouth daily. 01/07/17   Steele Sizer, MD  LORazepam (ATIVAN) 1 MG tablet Take 1 tablet (1 mg total) by mouth 2 (two) times daily. 09/25/17 09/25/18  Nena Polio, MD  LUMIGAN 0.01 % SOLN  02/26/16   [provider]  Multiple Vitamin (MULTIVITAMIN) tablet Take 1 tablet by mouth daily. Reported on 10/10/2015    [provider]  Omega-3 Fatty Acids (FISH OIL CONCENTRATE PO) Take by mouth. Reported on 10/10/2015    [provider]  pimecrolimus (ELIDEL) 1 % cream Apply  topically 2 (two) times daily. 06/09/17   Steele Sizer, MD  rosuvastatin (CRESTOR) 10 MG tablet Take 1 tablet (10 mg total) by mouth daily. 06/09/17   Steele Sizer, MD  triamcinolone cream (KENALOG) 0.1 % APPLY TO AFFECTED AREA EXTERNALLY 2 TO 3 TIMES A DAY FOR RASH 09/06/17   Steele Sizer, MD  vitamin C (ASCORBIC ACID) 500 MG tablet Take 500 mg by mouth daily. Reported on 10/10/2015    [provider]  vitamin E 400 UNIT capsule Take 400 Units by mouth daily. Reported on 10/10/2015    [provider]    Allergies Atorvastatin and Terbinafine and related  Family History  Problem Relation Age of Onset  . Hypertension Father     Social History Social History   Tobacco Use  . Smoking status: Never Smoker  . Smokeless tobacco: Never Used  . Tobacco comment: experimented with dip snuff as a child  Substance Use Topics  . Alcohol use: No  . Drug use: No    Review of Systems  Constitutional: No fever/chills Eyes: No visual changes. ENT: No sore throat. Cardiovascular: Denies chest pain. Respiratory: Denies shortness of breath. Gastrointestinal: No abdominal pain.  No nausea, no vomiting.  No diarrhea.  No constipation. Genitourinary: Negative for dysuria. Musculoskeletal: Negative for back pain. Skin: Negative for rash. Neurological: Negative for headaches, focal weakness ____________________________________________   PHYSICAL EXAM:  VITAL SIGNS: ED Triage Vitals  Enc Vitals Group     BP 09/25/17 0745 (!) 166/63     Pulse Rate 09/25/17 0745 (!) 59     Resp 09/25/17 0745 15     Temp 09/25/17 0745 97.8 F (36.6 C)     Temp Source 09/25/17 0745 Oral     SpO2 09/25/17 0745 97 %     Weight 09/25/17 0745 169 lb (76.7 kg)     Height 09/25/17 0745 5' 1.5" (1.562 m)     Head Circumference --      Peak Flow --      Pain Score 09/25/17 0748 0     Pain Loc --      Pain Edu? --      Excl. in [place]? --    Constitutional: Alert and oriented. Well appearing and  in no acute distress. Eyes: Conjunctivae are normal. PERRL. EOMI. fundi appear normal Head: Atraumatic. Nose: No congestion/rhinnorhea. Ears: TMs are clear Mouth/Throat: Mucous membranes are moist.  Oropharynx non-erythematous. Neck: No stridor.  Cardiovascular: Normal rate, regular rhythm. Grossly normal heart sounds.  Good peripheral circulation. Respiratory: Normal respiratory effort.  No retractions. Lungs CTAB. Gastrointestinal: Soft and nontender. No distention. No abdominal bruits. No CVA tenderness. Musculoskeletal: No lower extremity tenderness nor edema.  No joint effusions. Neurologic:  Normal speech and language. No gross focal neurologic deficits are appreciated.  Cranial nerves II through XII appear to be intact of the visual fields were not checked.  Patient has latency of the nystagmus but not fatigability.  There  is a small component of verticality to the nystagmus.  Additionally thrust testing does not show any deviation which could indicate no central component.  Cerebellar finger-to-nose rapid alternating movements and hands and heel-to-shin are all normal.  Motor strength is 5 out of 5 throughout and there is no numbness anywhere. Skin:  Skin is warm, dry and intact. No rash noted. Psychiatric: Mood and affect are normal. Speech and behavior are normal.  ____________________________________________   LABS (all labs ordered are listed, but only abnormal results are displayed)  Labs Reviewed  BASIC METABOLIC PANEL - Abnormal; Notable for the following components:      Result Value   Glucose, Bld 102 (*)    All other components within normal limits  CBC - Abnormal; Notable for the following components:   RBC 5.23 (*)    All other components within normal limits  TROPONIN I  URINALYSIS, COMPLETE (UACMP) WITH MICROSCOPIC  CBG MONITORING, ED   ____________________________________________  EKG  EKG read interpreted by me shows sinus bradycardia at a rate of 55 normal  axis no acute ST-T wave changes ____________________________________________  RADIOLOGY  ED MD interpretation:  Official radiology report(s): Mr Jeri Cos Wo Contrast  Result Date: 09/25/2017 CLINICAL DATA:  Patient reports dizziness upon standing this morning. EXAM: MRI HEAD WITHOUT AND WITH CONTRAST TECHNIQUE: Multiplanar, multiecho pulse sequences of the brain and surrounding structures were obtained without and with intravenous contrast. CONTRAST:  37mL MULTIHANCE GADOBENATE DIMEGLUMINE 529 MG/ML IV SOLN COMPARISON:  MR brain 01/07/2010. FINDINGS: Brain: No evidence of acute stroke, acute hemorrhage, hydrocephalus, or extra-axial fluid. Mild atrophy. Minor white matter disease, likely chronic microvascular ischemic change. There is a RIGHT parafalcine posterior frontal extra-axial mass, restricted diffusion, demonstrating avid postcontrast enhancement. Measurements are 9 x 17 x 18 mm. No involvement of the superior sagittal sinus. Mass effect, but no regional vasogenic edema, upon the posterior frontal parasagittal cortex. Findings are consistent with a incidental meningioma. The lesion was present in retrospect in 2011, but difficult to visualize. Vascular: Normal flow voids. Skull and upper cervical spine: Normal marrow signal. Sinuses/Orbits: Negative. Other: None. IMPRESSION: 1 x 2 cm RIGHT posterior frontal interhemispheric falx meningioma. Mass effect on the adjacent cortex without regional edema. Interval progression since 2011. Elective Neurosurgical consultation is warranted. No acute intracranial findings. Electronically Signed   By: Staci Righter M.D.   On: 09/25/2017 11:31    ____________________________________________   PROCEDURES  Procedure(s) performed:   Procedures  Critical Care performed:   ____________________________________________   INITIAL IMPRESSION / ASSESSMENT AND PLAN / ED COURSE  Because stress testing has no deviation and the area is the small component of  vertical nystagmus and the lack of nausea and the patient's past history of hypertension and high cholesterol I am somewhat worried that this may be a posterior circulation stroke.  I will go ahead and get an MRI of her head to evaluate for this.  Meclizine has a warning for using glaucoma but I spoke with Dr. Edison Pace ophthalmology who says he does not believe the patient would have any problem with glaucoma especially since she takes the [place] for what appears to be open angle glaucoma    Discussed MRI with Dr. Crista Elliot MO R ODI neurosurgery at [facility] he feels that the meningioma has nothing to do with the vertigo which is what I suspected.  He will follow-up with Dr. Lacinda Axon on the meningioma.  Patient feels she is okay to go home she thinks she can get around  okay at home I will give her some Antivert which she actually Artie bought at the pharmacy last night and some Ativan to see if that helps as well.  Patient will return if she is worse follow-up with her doctor if no better in about a week    Reviewed vital signs with neurosurgery as well.  Patient is taking diltiazem for hypertension.  This may explain her relative bradycardia with hypertension.  Again she has no neurological defects and has remained stable during her whole visit.  ____________________________________________   FINAL CLINICAL IMPRESSION(S) / ED DIAGNOSES  Final diagnoses:  Vertigo     ED Discharge Orders        Ordered    LORazepam (ATIVAN) 1 MG tablet  2 times daily     09/25/17 1229       Note:  This document was prepared using Dragon voice recognition software and may include unintentional dictation errors.    Nena Polio, MD 09/25/17 1237    Nena Polio, MD 09/25/17 [phone]

## 2017-09-25 NOTE — ED Notes (Signed)
Patient transported to MRI 

## 2017-09-25 NOTE — Discharge Instructions (Signed)
Please use the Antivert as directed.  You have a meningioma which is a benign very slow-growing tumor in the brain.  Usually they do not cause any problems.  I am not sure that is causing the vertigo at all.  But the radiologist thinks it might be good idea to follow-up with neurosurgery.  I have given you Dr. Jonathon Jordan information he is a Designer, jewellery who is on call here during the week.  Please call his office number and arrange for follow-up.  Please return here if you are worse or not any better within a week.  Please follow-up with your regular doctor.

## 2017-10-04 ENCOUNTER — Encounter: Payer: Self-pay | Admitting: Family Medicine

## 2017-10-04 ENCOUNTER — Telehealth: Payer: Self-pay

## 2017-10-04 DIAGNOSIS — D329 Benign neoplasm of meninges, unspecified: Secondary | ICD-10-CM | POA: Insufficient documentation

## 2017-10-04 NOTE — Telephone Encounter (Signed)
She needs to come in, meningiomas are benign growth but we can discuss further during her follow up

## 2017-10-04 NOTE — Telephone Encounter (Signed)
Copied from [place] [phone]. Topic: General - Other >> Oct 04, 2017  8:17 AM Vickie Stewart wrote: Pt said she has a bad case of vertigo and went to the ER last week and had a MRI that she said showed she need a brain tumor. She said they told her to fup with her pcp but there is no availability

## 2017-10-07 ENCOUNTER — Ambulatory Visit: Payer: Medicare Other | Admitting: Family Medicine

## 2017-10-07 ENCOUNTER — Encounter: Payer: Self-pay | Admitting: Family Medicine

## 2017-10-07 ENCOUNTER — Encounter

## 2017-10-07 VITALS — BP 136/68 | HR 63 | Temp 98.3°F | Resp 16 | Ht 61.0 in | Wt 174.3 lb

## 2017-10-07 DIAGNOSIS — D329 Benign neoplasm of meninges, unspecified: Secondary | ICD-10-CM

## 2017-10-07 DIAGNOSIS — R42 Dizziness and giddiness: Secondary | ICD-10-CM

## 2017-10-07 MED ORDER — MECLIZINE HCL 12.5 MG PO TABS: 12.5000 mg | ORAL_TABLET | Freq: Three times a day (TID) | ORAL | 0 refills | Status: DC | PRN

## 2017-10-07 NOTE — Patient Instructions (Signed)
Benign Positional Vertigo Vertigo is the feeling that you or your surroundings are moving when they are not. Benign positional vertigo is the most common form of vertigo. The cause of this condition is not serious (is benign). This condition is triggered by certain movements and positions (is positional). This condition can be dangerous if it occurs while you are doing something that could endanger you or others, such as driving. What are the causes? In many cases, the cause of this condition is not known. It may be caused by a disturbance in an area of the inner ear that helps your brain to sense movement and balance. This disturbance can be caused by a viral infection (labyrinthitis), head injury, or repetitive motion. What increases the risk? This condition is more likely to develop in:  Women.  People who are 50 years of age or older.  What are the signs or symptoms? Symptoms of this condition usually happen when you move your head or your eyes in different directions. Symptoms may start suddenly, and they usually last for less than a minute. Symptoms may include:  Loss of balance and falling.  Feeling like you are spinning or moving.  Feeling like your surroundings are spinning or moving.  Nausea and vomiting.  Blurred vision.  Dizziness.  Involuntary eye movement (nystagmus).  Symptoms can be mild and cause only slight annoyance, or they can be severe and interfere with daily life. Episodes of benign positional vertigo may return (recur) over time, and they may be triggered by certain movements. Symptoms may improve over time. How is this diagnosed? This condition is usually diagnosed by medical history and a physical exam of the head, neck, and ears. You may be referred to a health care provider who specializes in ear, nose, and throat (ENT) problems (otolaryngologist) or a provider who specializes in disorders of the nervous system (neurologist). You may have additional testing,  including:  MRI.  A CT scan.  Eye movement tests. Your health care provider may ask you to change positions quickly while he or she watches you for symptoms of benign positional vertigo, such as nystagmus. Eye movement may be tested with an electronystagmogram (ENG), caloric stimulation, the Dix-Hallpike test, or the roll test.  An electroencephalogram (EEG). This records electrical activity in your brain.  Hearing tests.  How is this treated? Usually, your health care provider will treat this by moving your head in specific positions to adjust your inner ear back to normal. Surgery may be needed in severe cases, but this is rare. In some cases, benign positional vertigo may resolve on its own in 2-4 weeks. Follow these instructions at home: Safety  Move slowly.Avoid sudden body or head movements.  Avoid driving.  Avoid operating heavy machinery.  Avoid doing any tasks that would be dangerous to you or others if a vertigo episode would occur.  If you have trouble walking or keeping your balance, try using a cane for stability. If you feel dizzy or unstable, sit down right away.  Return to your normal activities as told by your health care provider. Ask your health care provider what activities are safe for you. General instructions  Take over-the-counter and prescription medicines only as told by your health care provider.  Avoid certain positions or movements as told by your health care provider.  Drink enough fluid to keep your urine clear or pale yellow.  Keep all follow-up visits as told by your health care provider. This is important. Contact a health care   provider if:  You have a fever.  Your condition gets worse or you develop new symptoms.  Your family or friends notice any behavioral changes.  Your nausea or vomiting gets worse.  You have numbness or a "pins and needles" sensation. Get help right away if:  You have difficulty speaking or moving.  You are  always dizzy.  You faint.  You develop severe headaches.  You have weakness in your legs or arms.  You have changes in your hearing or vision.  You develop a stiff neck.  You develop sensitivity to light. This information is not intended to replace advice given to you by your health care provider. Make sure you discuss any questions you have with your health care provider. Document Released: 02/23/2006 Document Revised: 10/24/2015 Document Reviewed: 09/10/2014 Elsevier Interactive Patient Education  2018 [employer]   How to Perform the Epley Maneuver The Epley maneuver is an exercise that relieves symptoms of vertigo. Vertigo is the feeling that you or your surroundings are moving when they are not. When you feel vertigo, you may feel like the room is spinning and have trouble walking. Dizziness is a little different than vertigo. When you are dizzy, you may feel unsteady or light-headed. You can do this maneuver at home whenever you have symptoms of vertigo. You can do it up to 3 times a day until your symptoms go away. Even though the Epley maneuver may relieve your vertigo for a few weeks, it is possible that your symptoms will return. This maneuver relieves vertigo, but it does not relieve dizziness. What are the risks? If it is done correctly, the Epley maneuver is considered safe. Sometimes it can lead to dizziness or nausea that goes away after a short time. If you develop other symptoms, such as changes in vision, weakness, or numbness, stop doing the maneuver and call your health care provider. How to perform the Epley maneuver 1. Sit on the edge of a bed or table with your back straight and your legs extended or hanging over the edge of the bed or table. 2. Turn your head halfway toward the affected ear or side. 3. Lie backward quickly with your head turned until you are lying flat on your back. You may want to position a pillow under your shoulders. 4. Hold this position  for 30 seconds. You may experience an attack of vertigo. This is normal. 5. Turn your head to the opposite direction until your unaffected ear is facing the floor. 6. Hold this position for 30 seconds. You may experience an attack of vertigo. This is normal. Hold this position until the vertigo stops. 7. Turn your whole body to the same side as your head. Hold for another 30 seconds. 8. Sit back up. You can repeat this exercise up to 3 times a day. Follow these instructions at home:  After doing the Epley maneuver, you can return to your normal activities.  Ask your health care provider if there is anything you should do at home to prevent vertigo. He or she may recommend that you: ? Keep your head raised (elevated) with two or more pillows while you sleep. ? Do not sleep on the side of your affected ear. ? Get up slowly from bed. ? Avoid sudden movements during the day. ? Avoid extreme head movement, like looking up or bending over. Contact a health care provider if:  Your vertigo gets worse.  You have other symptoms, including: ? Nausea. ? Vomiting. ? Headache.   Get help right away if:  You have vision changes.  You have a severe or worsening headache or neck pain.  You cannot stop vomiting.  You have new numbness or weakness in any part of your body. Summary  Vertigo is the feeling that you or your surroundings are moving when they are not.  The Epley maneuver is an exercise that relieves symptoms of vertigo.  If the Epley maneuver is done correctly, it is considered safe. You can do it up to 3 times a day. This information is not intended to replace advice given to you by your health care provider. Make sure you discuss any questions you have with your health care provider. Document Released: 05/23/2013 Document Revised: 04/07/2016 Document Reviewed: 04/07/2016 Elsevier Interactive Patient Education  2017 [employer] Inc.  

## 2017-10-07 NOTE — Progress Notes (Signed)
Name: Vickie Stewart   MRN: [id number]    DOB: [birth year]   Date:10/07/2017       Progress Note  Subjective  Chief Complaint  Chief Complaint  Patient presents with  . Benign Meningiomas    Went to ER due to being off balance, dizzy from the room spinning.     HPI  Vertigo/Meningioma: she developed acute onset of dizziness/vertigo 09/24/2017  that was intermittent and progressively worse with inability to walk. No nausea, vomiting, weakness, change in vision, headache. Symptoms triggered by head movement, better when sitting still. She went to [facility] on 09/25/2017 diagnosed with meningioma and sent home on Lorazepam 1 mg bid, she did not take it today. States vertigo resolved yesterday.    Patient Active Problem List   Diagnosis Date Noted  . Meningioma ([place]) 10/04/2017  . Lichen sclerosus 45/62/5638  . Vaginal atrophy 03/18/2016  . Encounter for follow-up surveillance of vaginal cancer 03/18/2016  . Eczema 10/31/2015  . Left hand pain 09/10/2015  . Multinodular goiter 07/26/2015  . Osteopenia 07/26/2015  . Menopause 07/26/2015  . Irritable colon 07/26/2015  . History of cancer of vagina 07/26/2015  . Diffuse cystic mastopathy 07/26/2015  . Obesity (BMI 30.0-34.9) 07/26/2015  . Intermittent low back pain 07/26/2015  . Hyperlipemia 07/26/2015  . Osteoarthritis 05/13/2015  . Allergic rhinitis 05/13/2015  . GERD (gastroesophageal reflux disease) 05/13/2015  . Essential hypertension, benign     Past Surgical History:  Procedure Laterality Date  . BREAST EXCISIONAL BIOPSY Left    neg  . BREAST LUMPECTOMY Left   . COLONOSCOPY  08/26/2006  . COLONOSCOPY WITH PROPOFOL N/A 10/21/2016   Procedure: COLONOSCOPY WITH PROPOFOL;  Surgeon: Christene Lye, MD;  Location: [facility] ENDOSCOPY;  Service: Endoscopy;  Laterality: N/A;    Family History  Problem Relation Age of Onset  . Hypertension Father     Social History   Socioeconomic History  . Marital status: Widowed   Spouse name: Not on file  . Number of children: Not on file  . Years of education: Not on file  . Highest education level: Some college, no degree  Occupational History  . Occupation: Radio producer: [employer]  Social Needs  . Financial resource strain: Not hard at all  . Food insecurity:    Worry: Never true    Inability: Never true  . Transportation needs:    Medical: No    Non-medical: No  Tobacco Use  . Smoking status: Never Smoker  . Smokeless tobacco: Never Used  . Tobacco comment: experimented with dip snuff as a child  Substance and Sexual Activity  . Alcohol use: No  . Drug use: No  . Sexual activity: Not Currently    Birth control/protection: Post-menopausal  Lifestyle  . Physical activity:    Days per week: 0 days    Minutes per session: 0 min  . Stress: Not at all  Relationships  . Social connections:    Talks on phone: More than three times a week    Gets together: More than three times a week    Attends religious service: More than 4 times per year    Active member of club or organization: Yes    Attends meetings of clubs or organizations: More than 4 times per year    Relationship status: Widowed  . Intimate partner violence:    Fear of current or ex partner: No    Emotionally abused: No    Physically abused: No  Forced sexual activity: No  Other Topics Concern  . Not on file  Social History Narrative  . Not on file     Current Outpatient Medications:  .  aspirin 81 MG tablet, Take 81 mg by mouth daily. Reported on 10/10/2015, Disp: , Rfl:  .  cholecalciferol (VITAMIN D) 1000 units tablet, Take 1,000 Units by mouth daily., Disp: , Rfl:  .  clobetasol ointment (TEMOVATE) 4.23 %, Apply 1 application topically 2 (two) times daily. 2 x daily for 2 weeks then once a day for 4 weeks, Disp: 30 g, Rfl: 3 .  diltiazem (CARDIZEM CD) 360 MG 24 hr capsule, Take 1 capsule (360 mg total) by mouth daily., Disp: 90 capsule, Rfl: 1 .  latanoprost (XALATAN)  0.005 % ophthalmic solution, PLACE 1 DROP IN BOTH EYES AT BEDTIME, Disp: , Rfl: 4 .  lisinopril (PRINIVIL,ZESTRIL) 20 MG tablet, Take 1 tablet (20 mg total) by mouth daily., Disp: 90 tablet, Rfl: 1 .  loratadine (CLARITIN) 10 MG tablet, Take 1 tablet (10 mg total) by mouth daily., Disp: 90 tablet, Rfl: 1 .  LORazepam (ATIVAN) 1 MG tablet, Take 1 tablet (1 mg total) by mouth 2 (two) times daily., Disp: 30 tablet, Rfl: 0 .  LORazepam (ATIVAN) 1 MG tablet, Take 1 mg by mouth every 8 (eight) hours., Disp: , Rfl:  .  LUMIGAN 0.01 % SOLN, , Disp: , Rfl:  .  Multiple Vitamin (MULTIVITAMIN) tablet, Take 1 tablet by mouth daily. Reported on 10/10/2015, Disp: , Rfl:  .  Omega-3 Fatty Acids (FISH OIL CONCENTRATE PO), Take by mouth. Reported on 10/10/2015, Disp: , Rfl:  .  pimecrolimus (ELIDEL) 1 % cream, Apply topically 2 (two) times daily., Disp: 100 g, Rfl: 0 .  rosuvastatin (CRESTOR) 10 MG tablet, Take 1 tablet (10 mg total) by mouth daily., Disp: 90 tablet, Rfl: 1 .  triamcinolone cream (KENALOG) 0.1 %, APPLY TO AFFECTED AREA EXTERNALLY 2 TO 3 TIMES A DAY FOR RASH, Disp: 90 g, Rfl: 0 .  vitamin C (ASCORBIC ACID) 500 MG tablet, Take 500 mg by mouth daily. Reported on 10/10/2015, Disp: , Rfl:  .  vitamin E 400 UNIT capsule, Take 400 Units by mouth daily. Reported on 10/10/2015, Disp: , Rfl:   Allergies  Allergen Reactions  . Atorvastatin     difficulty concentrating and focusing  . Terbinafine And Related Dermatitis    Rash, itch, skin discoloration     ROS  Constitutional: Negative for fever or weight change.  Respiratory: Negative for cough and shortness of breath.   Cardiovascular: Negative for chest pain or palpitations.  Gastrointestinal: Negative for abdominal pain, no bowel changes.  Musculoskeletal: Negative for gait problem or joint swelling.  Skin: Negative for rash.  Neurological: Negative for dizziness ( resolved )  or headache.  No other specific complaints in a complete review of  systems (except as listed in HPI above).  Objective  Vitals:   10/07/17 1255  BP: 136/68  Pulse: 63  Resp: 16  Temp: 98.3 F (36.8 C)  TempSrc: Oral  SpO2: 96%  Weight: 174 lb 4.8 oz (79.1 kg)  Height: _0  (1.549 m)    Body mass index is 32.93 kg/m.  Physical Exam  Constitutional: Patient appears well-developed and well-nourished. Obese  No distress.  HEENT: head atraumatic, normocephalic, pupils equal and reactive to light, ears normal TM bilaterally, neck supple, throat within normal limits Cardiovascular: Normal rate, regular rhythm and normal heart sounds.  No murmur heard. No BLE  edema. Pulmonary/Chest: Effort normal and breath sounds normal. No respiratory distress. Abdominal: Soft.  There is no tenderness. Psychiatric: Patient has a normal mood and affect. behavior is normal. Judgment and thought content normal. Neurological: normal exam, no focal findings. No nystagmus   Recent Results (from the past 2160 hour(s))  Pap IG and HPV (high risk) DNA detection     Status: None   Collection Time: 07/27/17  3:17 PM  Result Value Ref Range   Clinical Information:      Comment: None given   LMP:      Comment: NONE GIVEN   PREV. PAP:      Comment: NONE GIVEN   PREV. BX:      Comment: NONE GIVEN   HPV DNA Probe-Source      Comment: None given   STATEMENT OF ADEQUACY:      Comment: SATISFACTORY FOR EVALUATION   INTERPRETATION/RESULT:      Comment: Negative for intraepithelial lesion or malignancy. Atrophic pattern; predominantly parabasal cells    Comment:      Comment: This Pap test has been evaluated with computer assisted technology. Parabasal cells in smears that lack maturation due to atrophy or other hormonal reasons cannot be differentiated from transformation zone cells. Accordingly, presence or absence of endocervical or transformation zone components cannot be reported in this patient.    CYTOTECHNOLOGIST:      Comment: JRW, CT(ASCP) CT screening  location: [address], [address], [place], [place] [address]    HPV DNA High Risk Not Detected Not Detect    Comment: This test was performed using the APTIMA HPV Assay ([employer] . This assay detects E6/E7 viral messenger RNA (mRNA) from 14 high-risk HPV types (16,18,31,33,35,39,45,51,52,56,58,59,66,68). . The analytical performance characteristics of this assay have been determined by [facility]. The modifications have not been cleared or approved by the FDA. This assay has been validated pursuant to the CLIA regulations and is used for clinical purposes. EXPLANATORY NOTE:  . The Pap is a screening test for cervical cancer. It is  not a diagnostic test and is subject to false negative  and false positive results. It is most reliable when a  satisfactory sample, regularly obtained, is submitted  with relevant clinical findings and history, and when  the Pap result is evaluated along with historic and  current clinical information. .   Basic metabolic panel     Status: Abnormal   Collection Time: 09/25/17  7:58 AM  Result Value Ref Range   Sodium 140 135 - 145 mmol/L   Potassium 3.7 3.5 - 5.1 mmol/L   Chloride 106 101 - 111 mmol/L   CO2 23 22 - 32 mmol/L   Glucose, Bld 102 (H) 65 - 99 mg/dL   BUN 16 6 - 20 mg/dL   Creatinine, Ser 0.65 0.44 - 1.00 mg/dL   Calcium 9.3 8.9 - 10.3 mg/dL   GFR calc non Af Amer >60 >60 mL/min   GFR calc Af Amer >60 >60 mL/min    Comment: (NOTE) The eGFR has been calculated using the CKD EPI equation. This calculation has not been validated in all clinical situations. eGFR's persistently <60 mL/min signify possible Chronic Kidney Disease.    Anion gap 11 5 - 15    Comment: Performed at [facility], [address] [place], [place] [address]  CBC     Status: Abnormal   Collection Time: 09/25/17  7:58 AM  Result Value Ref Range   WBC 4.0 3.6 -  11.0 K/uL   RBC 5.23 (H) 3.80 - 5.20 MIL/uL   Hemoglobin 15.6 12.0 -  16.0 g/dL   HCT 46.0 35.0 - 47.0 %   MCV 87.9 80.0 - 100.0 fL   MCH 29.8 26.0 - 34.0 pg   MCHC 33.9 32.0 - 36.0 g/dL   RDW 13.7 11.5 - 14.5 %   Platelets 230 150 - 440 K/uL    Comment: Performed at [facility], [address] [place], [place] [address]  Troponin I     Status: None   Collection Time: 09/25/17  7:58 AM  Result Value Ref Range   Troponin I <0.03 <0.03 ng/mL    Comment: Performed at [facility], [address] [place], [place] [address]     PHQ2/9: Depression screen [facility] 2/9 07/27/2017 06/09/2017 06/02/2016 01/23/2016 10/10/2015  Decreased Interest 0 0 0 0 0  Down, Depressed, Hopeless 0 0 0 0 0  PHQ - 2 Score 0 0 0 0 0     Fall Risk: Fall Risk  10/07/2017 07/27/2017 06/09/2017 06/02/2016 01/23/2016  Falls in the past year? _0      Functional Status Survey: Is the patient deaf or have difficulty hearing?: Yes(a little) Does the patient have difficulty seeing, even when wearing glasses/contacts?: Yes(glasses) Does the patient have difficulty concentrating, remembering, or making decisions?: No(hard to stay focus) Does the patient have difficulty walking or climbing stairs?: No Does the patient have difficulty dressing or bathing?: No Does the patient have difficulty doing errands alone such as visiting a doctor's office or shopping?: No    Assessment & Plan  1. Meningioma [facility])  She has follow up with Dr. Lacinda Axon - neurosurgeon next week   2. Vertigo  - meclizine (ANTIVERT) 12.5 MG tablet; Take 1 tablet (12.5 mg total) by mouth 3 (three) times daily as needed for dizziness.  Dispense: 30 tablet; Refill: 0 Stop lorazepam

## 2017-10-12 ENCOUNTER — Other Ambulatory Visit: Payer: Self-pay | Admitting: Neurosurgery

## 2017-10-12 DIAGNOSIS — D329 Benign neoplasm of meninges, unspecified: Secondary | ICD-10-CM

## 2017-10-13 ENCOUNTER — Telehealth: Payer: Self-pay

## 2017-10-13 ENCOUNTER — Other Ambulatory Visit: Payer: Self-pay | Admitting: Family Medicine

## 2017-10-13 DIAGNOSIS — R42 Dizziness and giddiness: Secondary | ICD-10-CM

## 2017-10-13 NOTE — Telephone Encounter (Signed)
Based on MRI report, she does not need ENT referral she needs to see neurosurgeon and appointment has already been scheduled with Dr. Lacinda Axon

## 2017-10-13 NOTE — Telephone Encounter (Signed)
Left a message on voicemail regarding referral for Neurosurgeon with Dr. Lacinda Axon.

## 2017-10-13 NOTE — Telephone Encounter (Signed)
Dr Lacinda Axon recommendable patient to go to a ENT. Patient states her head hurt so bad last night that she couldn't sleep last night. That is the reason she called since Dr. Lacinda Axon recommended the ENT and he stated PCP would have to put referral in.

## 2017-10-13 NOTE — Telephone Encounter (Signed)
Returning call.

## 2017-10-13 NOTE — Telephone Encounter (Signed)
Copied from [place] [id number]. Topic: Referral - Request >> Oct 13, 2017 10:42 AM Hewitt Shorts wrote: Pt states that she has had an MRI of her head and is now needing a referral to an ent office she states as soon as possible    Best number [phone]

## 2017-12-07 ENCOUNTER — Encounter: Payer: Self-pay | Admitting: Family Medicine

## 2017-12-07 ENCOUNTER — Ambulatory Visit: Payer: Medicare Other | Admitting: Family Medicine

## 2017-12-07 VITALS — BP 158/76 | HR 60 | Temp 98.2°F | Resp 16 | Ht 61.0 in | Wt 173.3 lb

## 2017-12-07 DIAGNOSIS — L309 Dermatitis, unspecified: Secondary | ICD-10-CM

## 2017-12-07 DIAGNOSIS — L308 Other specified dermatitis: Secondary | ICD-10-CM

## 2017-12-07 DIAGNOSIS — E7800 Pure hypercholesterolemia, unspecified: Secondary | ICD-10-CM

## 2017-12-07 DIAGNOSIS — I10 Essential (primary) hypertension: Secondary | ICD-10-CM

## 2017-12-07 DIAGNOSIS — E041 Nontoxic single thyroid nodule: Secondary | ICD-10-CM

## 2017-12-07 DIAGNOSIS — D329 Benign neoplasm of meninges, unspecified: Secondary | ICD-10-CM

## 2017-12-07 DIAGNOSIS — R42 Dizziness and giddiness: Secondary | ICD-10-CM | POA: Diagnosis not present

## 2017-12-07 DIAGNOSIS — G4709 Other insomnia: Secondary | ICD-10-CM

## 2017-12-07 DIAGNOSIS — K219 Gastro-esophageal reflux disease without esophagitis: Secondary | ICD-10-CM

## 2017-12-07 DIAGNOSIS — M62838 Other muscle spasm: Secondary | ICD-10-CM

## 2017-12-07 MED ORDER — PIMECROLIMUS 1 % EX CREA: TOPICAL_CREAM | Freq: Two times a day (BID) | CUTANEOUS | 0 refills | Status: DC

## 2017-12-07 MED ORDER — LISINOPRIL 40 MG PO TABS: 40.0000 mg | ORAL_TABLET | Freq: Every day | ORAL | 0 refills | Status: DC

## 2017-12-07 MED ORDER — TIZANIDINE HCL 4 MG PO CAPS: 4.0000 mg | ORAL_CAPSULE | Freq: Every day | ORAL | 0 refills | Status: DC

## 2017-12-07 MED ORDER — TRAZODONE HCL 50 MG PO TABS: 25.0000 mg | ORAL_TABLET | Freq: Every evening | ORAL | 3 refills | Status: DC | PRN

## 2017-12-07 MED ORDER — DILTIAZEM HCL ER COATED BEADS 360 MG PO CP24: 360.0000 mg | ORAL_CAPSULE | Freq: Every day | ORAL | 1 refills | Status: DC

## 2017-12-07 MED ORDER — ROSUVASTATIN CALCIUM 10 MG PO TABS: 10.0000 mg | ORAL_TABLET | Freq: Every day | ORAL | 1 refills | Status: DC

## 2017-12-07 NOTE — Progress Notes (Signed)
Name: Vickie Stewart   MRN: [id number]    DOB: 19-Jul-1945   Date:12/07/2017       Progress Note  Subjective  Chief Complaint  Chief Complaint  Patient presents with  . Medication Refill  . Hypertension    Dizzy, BP has been up and down  . Gastroesophageal Reflux    Well controlled  . Dyslipidemia  . Goiter  . Dizziness    States her left ear is bothering her and hurts-has seen ENT. Worst when lay down and on left side she can feel it like it is draining.    HPI   Vertigo: she states symptoms started April 2019, she went to [facility], MRI brain showed meningioma and she has seen ENT and also Dr. Lacinda Axon ( neurosurgeon). She was given reassurance by Dr. Lacinda Axon, but advised to follow up with ENT if still has symptoms. She has noticed tension on nuchal area when sitting on her recliner, followed by dizziness, no longer spinning sensation, massage of neck seems to help. Denies associated nausea, vomiting, hearing loss or tinnitus. She states [employer] helped with symptoms initially and advised to go back if recurrence of symptoms   GERD: she is off medication and doing well at this time, very seldom has indigestion, at most once every 3 weeks and does not need medication  Goiter: last [place] in 2018, no dysphagia, doing well.   HTN: bp is high today, usually at goal, denies chest pain, SOB  or palpitation  Dyslipidemia: taking statin therapy, no chest pain or palpitation.  Reviewed last labs with patient   Mild anxiety, mild abnormal phq9, and insomnia: she states worse symptoms is inability to sleep at night, and would like help with that, not interested in any other medications for mood or depression/anxiety     Patient Active Problem List   Diagnosis Date Noted  . Meningioma ([place]) 10/04/2017  . Lichen sclerosus 59/29/2446  . Vaginal atrophy 03/18/2016  . Encounter for follow-up surveillance of vaginal cancer 03/18/2016  . Eczema 10/31/2015  . Left hand pain 09/10/2015  .  Multinodular goiter 07/26/2015  . Osteopenia 07/26/2015  . Menopause 07/26/2015  . Irritable colon 07/26/2015  . History of cancer of vagina 07/26/2015  . Diffuse cystic mastopathy 07/26/2015  . Obesity (BMI 30.0-34.9) 07/26/2015  . Intermittent low back pain 07/26/2015  . Hyperlipemia 07/26/2015  . Osteoarthritis 05/13/2015  . Allergic rhinitis 05/13/2015  . GERD (gastroesophageal reflux disease) 05/13/2015  . Essential hypertension, benign     Past Surgical History:  Procedure Laterality Date  . BREAST EXCISIONAL BIOPSY Left    neg  . BREAST LUMPECTOMY Left   . COLONOSCOPY  08/26/2006  . COLONOSCOPY WITH PROPOFOL N/A 10/21/2016   Procedure: COLONOSCOPY WITH PROPOFOL;  Surgeon: Christene Lye, MD;  Location: [facility] ENDOSCOPY;  Service: Endoscopy;  Laterality: N/A;    Family History  Problem Relation Age of Onset  . Hypertension Father     Social History   Socioeconomic History  . Marital status: Widowed    Spouse name: Not on file  . Number of children: Not on file  . Years of education: Not on file  . Highest education level: Some college, no degree  Occupational History  . Occupation: Radio producer: [employer]  Social Needs  . Financial resource strain: Not hard at all  . Food insecurity:    Worry: Never true    Inability: Never true  . Transportation needs:    Medical: No  Non-medical: No  Tobacco Use  . Smoking status: Never Smoker  . Smokeless tobacco: Never Used  . Tobacco comment: experimented with dip snuff as a child  Substance and Sexual Activity  . Alcohol use: No  . Drug use: No  . Sexual activity: Not Currently    Birth control/protection: Post-menopausal  Lifestyle  . Physical activity:    Days per week: 0 days    Minutes per session: 0 min  . Stress: Not at all  Relationships  . Social connections:    Talks on phone: More than three times a week    Gets together: More than three times a week    Attends religious service:  More than 4 times per year    Active member of club or organization: Yes    Attends meetings of clubs or organizations: More than 4 times per year    Relationship status: Widowed  . Intimate partner violence:    Fear of current or ex partner: No    Emotionally abused: No    Physically abused: No    Forced sexual activity: No  Other Topics Concern  . Not on file  Social History Narrative  . Not on file     Current Outpatient Medications:  .  aspirin 81 MG tablet, Take 81 mg by mouth daily. Reported on 10/10/2015, Disp: , Rfl:  .  cholecalciferol (VITAMIN D) 1000 units tablet, Take 1,000 Units by mouth daily., Disp: , Rfl:  .  diltiazem (CARDIZEM CD) 360 MG 24 hr capsule, Take 1 capsule (360 mg total) by mouth daily., Disp: 90 capsule, Rfl: 1 .  latanoprost (XALATAN) 0.005 % ophthalmic solution, PLACE 1 DROP IN BOTH EYES AT BEDTIME, Disp: , Rfl: 4 .  lisinopril (PRINIVIL,ZESTRIL) 40 MG tablet, Take 1 tablet (40 mg total) by mouth daily., Disp: 90 tablet, Rfl: 0 .  loratadine (CLARITIN) 10 MG tablet, Take 1 tablet (10 mg total) by mouth daily., Disp: 90 tablet, Rfl: 1 .  LUMIGAN 0.01 % SOLN, , Disp: , Rfl:  .  meclizine (ANTIVERT) 12.5 MG tablet, Take 1 tablet (12.5 mg total) by mouth 3 (three) times daily as needed for dizziness., Disp: 30 tablet, Rfl: 0 .  Multiple Vitamin (MULTIVITAMIN) tablet, Take 1 tablet by mouth daily. Reported on 10/10/2015, Disp: , Rfl:  .  Omega-3 Fatty Acids (FISH OIL CONCENTRATE PO), Take by mouth. Reported on 10/10/2015, Disp: , Rfl:  .  pimecrolimus (ELIDEL) 1 % cream, Apply topically 2 (two) times daily., Disp: 100 g, Rfl: 0 .  rosuvastatin (CRESTOR) 10 MG tablet, Take 1 tablet (10 mg total) by mouth daily., Disp: 90 tablet, Rfl: 1 .  triamcinolone cream (KENALOG) 0.1 %, APPLY TO AFFECTED AREA EXTERNALLY 2 TO 3 TIMES A DAY FOR RASH, Disp: 90 g, Rfl: 0 .  vitamin C (ASCORBIC ACID) 500 MG tablet, Take 500 mg by mouth daily. Reported on 10/10/2015, Disp: , Rfl:   .  vitamin E 400 UNIT capsule, Take 400 Units by mouth daily. Reported on 10/10/2015, Disp: , Rfl:  .  LORazepam (ATIVAN) 1 MG tablet, Take 1 tablet (1 mg total) by mouth 2 (two) times daily. (Patient not taking: Reported on 12/07/2017), Disp: 30 tablet, Rfl: 0 .  tiZANidine (ZANAFLEX) 4 MG capsule, Take 1 capsule (4 mg total) by mouth at bedtime., Disp: 30 capsule, Rfl: 0 .  traZODone (DESYREL) 50 MG tablet, Take 0.5-1 tablets (25-50 mg total) by mouth at bedtime as needed for sleep., Disp: 30 tablet, Rfl:  3  Allergies  Allergen Reactions  . Atorvastatin     difficulty concentrating and focusing  . Terbinafine And Related Dermatitis    Rash, itch, skin discoloration     ROS  Constitutional: Negative for fever or weight change.  Respiratory: Negative for cough and shortness of breath.   Cardiovascular: Negative for chest pain or palpitations.  Gastrointestinal: Negative for abdominal pain, no bowel changes.  Musculoskeletal: Negative for gait problem or joint swelling.  Skin: Negative for rash.  Neurological: positive  For intermittent  dizziness but no  Headache or hearing loss .  No other specific complaints in a complete review of systems (except as listed in HPI above).  Objective  Vitals:   12/07/17 0925  BP: (!) 166/94  Pulse: 60  Resp: 16  Temp: 98.2 F (36.8 C)  TempSrc: Oral  SpO2: 97%  Weight: 173 lb 4.8 oz (78.6 kg)  Height: '5\' 1"'  (1.549 m)    Body mass index is 32.74 kg/m.  Physical Exam  Constitutional: Patient appears well-developed and well-nourished. Obese No distress.  HEENT: head atraumatic, normocephalic, pupils equal and reactive to light, ears : normal TM bilaterally , neck supple, throat within normal limits, no  thyromegaly  Cardiovascular: Normal rate, regular rhythm and normal heart sounds.  No murmur heard. No BLE edema. Pulmonary/Chest: Effort normal and breath sounds normal. No respiratory distress. Abdominal: Soft.  There is no  tenderness. Neurological: no nystagmus, normal strength, normal grip, romberg negative  Psychiatric: Patient has a normal mood and affect. behavior is normal. Judgment and thought content normal.  Recent Results (from the past 2160 hour(s))  Basic metabolic panel     Status: Abnormal   Collection Time: 09/25/17  7:58 AM  Result Value Ref Range   Sodium 140 135 - 145 mmol/L   Potassium 3.7 3.5 - 5.1 mmol/L   Chloride 106 101 - 111 mmol/L   CO2 23 22 - 32 mmol/L   Glucose, Bld 102 (H) 65 - 99 mg/dL   BUN 16 6 - 20 mg/dL   Creatinine, Ser 0.65 0.44 - 1.00 mg/dL   Calcium 9.3 8.9 - 10.3 mg/dL   GFR calc non Af Amer >60 >60 mL/min   GFR calc Af Amer >60 >60 mL/min    Comment: (NOTE) The eGFR has been calculated using the CKD EPI equation. This calculation has not been validated in all clinical situations. eGFR's persistently <60 mL/min signify possible Chronic Kidney Disease.    Anion gap 11 5 - 15    Comment: Performed at [facility], [address] [place], [place] [address]  CBC     Status: Abnormal   Collection Time: 09/25/17  7:58 AM  Result Value Ref Range   WBC 4.0 3.6 - 11.0 K/uL   RBC 5.23 (H) 3.80 - 5.20 MIL/uL   Hemoglobin 15.6 12.0 - 16.0 g/dL   HCT 46.0 35.0 - 47.0 %   MCV 87.9 80.0 - 100.0 fL   MCH 29.8 26.0 - 34.0 pg   MCHC 33.9 32.0 - 36.0 g/dL   RDW 13.7 11.5 - 14.5 %   Platelets 230 150 - 440 K/uL    Comment: Performed at [facility], [address] [place], [place] [address]  Troponin I     Status: None   Collection Time: 09/25/17  7:58 AM  Result Value Ref Range   Troponin I <0.03 <0.03 ng/mL    Comment: Performed at [facility], [address] [place], [place]  [address]     PHQ2/9: Depression screen [facility] 2/9 12/07/2017 12/07/2017 07/27/2017 06/09/2017 06/02/2016  Decreased Interest 1 0 0 0 0  Down, Depressed, Hopeless 1 0 0 0 0  PHQ - 2 Score 2 0 0 0 0  Altered sleeping 1 - - - -  Tired, decreased energy 1 - - - -  Change  in appetite 1 - - - -  Feeling bad or failure about yourself  0 - - - -  Trouble concentrating 1 - - - -  Moving slowly or fidgety/restless 0 - - - -  Suicidal thoughts 0 - - - -  PHQ-9 Score 6 - - - -  Difficult doing work/chores Somewhat difficult - - - -     Fall Risk: Fall Risk  12/07/2017 10/07/2017 07/27/2017 06/09/2017 06/02/2016  Falls in the past year? No No No No No      Functional Status Survey: Is the patient deaf or have difficulty hearing?: No Does the patient have difficulty seeing, even when wearing glasses/contacts?: Yes Does the patient have difficulty concentrating, remembering, or making decisions?: Yes(Staying focus and remembering) Does the patient have difficulty walking or climbing stairs?: No Does the patient have difficulty dressing or bathing?: No Does the patient have difficulty doing errands alone such as visiting a doctor's office or shopping?: No  GAD 7 : Generalized Anxiety Score 12/07/2017  Nervous, Anxious, on Edge 1  Control/stop worrying 0  Worry too much - different things 1  Trouble relaxing 1  Restless 0  Easily annoyed or irritable 1  Afraid - awful might happen 0  Total GAD 7 Score 4  Anxiety Difficulty Somewhat difficult      Assessment & Plan  1. Vertigo  - Ambulatory referral to ENT  2. Meningioma [facility])  Seen by Dr. Lacinda Axon and he does not think meningioma is the cause of her dizziness and advised follow up with ENT  3. Essential hypertension, benign  We will increase dose of ACE, she states HCTZ stopped because of high calcium in the past  - diltiazem (CARDIZEM CD) 360 MG 24 hr capsule; Take 1 capsule (360 mg total) by mouth daily.  Dispense: 90 capsule; Refill: 1 - lisinopril (PRINIVIL,ZESTRIL) 40 MG tablet; Take 1 tablet (40 mg total) by mouth daily.  Dispense: 90 tablet; Refill: 0   4. Pure hypercholesterolemia  - rosuvastatin (CRESTOR) 10 MG tablet; Take 1 tablet (10 mg total) by mouth daily.  Dispense: 90 tablet; Refill:  1  5. Gastroesophageal reflux disease without esophagitis  Unchanged   6. Left thyroid nodule  Stable based on last thyroid US  7. Eczema of face  - pimecrolimus (ELIDEL) 1 % cream; Apply topically 2 (two) times daily.  Dispense: 100 g; Refill: 0  8. Other eczema   9. Other insomnia  - traZODone (DESYREL) 50 MG tablet; Take 0.5-1 tablets (25-50 mg total) by mouth at bedtime as needed for sleep.  Dispense: 30 tablet; Refill: 3   10. Muscle spasms of neck  - tiZANidine (ZANAFLEX) 4 MG capsule; Take 1 capsule (4 mg total) by mouth at bedtime.  Dispense: 30 capsule; Refill: 0

## 2017-12-08 ENCOUNTER — Other Ambulatory Visit: Payer: Self-pay | Admitting: Family Medicine

## 2017-12-08 MED ORDER — DESONIDE 0.05 % EX CREA: TOPICAL_CREAM | Freq: Two times a day (BID) | CUTANEOUS | 0 refills | Status: DC

## 2018-01-03 ENCOUNTER — Other Ambulatory Visit: Payer: Self-pay | Admitting: Family Medicine

## 2018-01-03 DIAGNOSIS — M62838 Other muscle spasm: Secondary | ICD-10-CM

## 2018-01-07 ENCOUNTER — Encounter: Payer: Self-pay | Admitting: Family Medicine

## 2018-01-07 ENCOUNTER — Ambulatory Visit (INDEPENDENT_AMBULATORY_CARE_PROVIDER_SITE_OTHER): Payer: Medicare Other | Admitting: Family Medicine

## 2018-01-07 VITALS — BP 142/78 | HR 77 | Temp 98.2°F | Resp 16 | Ht 61.0 in | Wt 176.2 lb

## 2018-01-07 DIAGNOSIS — M542 Cervicalgia: Secondary | ICD-10-CM | POA: Diagnosis not present

## 2018-01-07 DIAGNOSIS — R42 Dizziness and giddiness: Secondary | ICD-10-CM | POA: Diagnosis not present

## 2018-01-07 DIAGNOSIS — I10 Essential (primary) hypertension: Secondary | ICD-10-CM

## 2018-01-07 MED ORDER — LISINOPRIL 20 MG PO TABS: 20.0000 mg | ORAL_TABLET | Freq: Every day | ORAL | 0 refills | Status: DC

## 2018-01-07 NOTE — Progress Notes (Signed)
Name: Vickie Stewart   MRN: [id number]    DOB: 1945/06/29   Date:01/07/2018       Progress Note  Subjective  Chief Complaint  Chief Complaint  Patient presents with  . Hypertension    HPI  HTN: she states tried lisinopril 40 mg but made her feel more dizzy and bp was still spiking when neck pain was worse. She is back on 20 mg daily plus Cardizem. No chest pain or palpitation.   Neck pain: going on for months, she states neck feels tight and she has been doing exercises to stretch her neck, also has a little machine that vibrates and helps the tension. She states pain is worse at the end of the day, pain is describes as tightness sensation. No rashes. No radiation of pain, no tingling or numbness.   Vertigo: she states symptoms started April 2019, she went to [facility], MRI brain showed meningioma and she has seen ENT and also Dr. Lacinda Axon ( neurosurgeon). She was given reassurance by Dr. Lacinda Axon, but advised to follow up with ENT if still has symptoms. She has noticed tension on nuchal area when sitting on her recliner, followed by dizziness, no longer spinning sensation, massage of neck seems to help. Denies associated nausea, vomiting, hearing loss or tinnitus. She states [facility] helped with symptoms initially and advised to go back if recurrence of symptoms. She states symptoms more present at the end of the day , when she lays down on her recliner and notices pain on left side of neck.   Patient Active Problem List   Diagnosis Date Noted  . Meningioma ([place]) 10/04/2017  . Lichen sclerosus 95/62/1308  . Vaginal atrophy 03/18/2016  . Encounter for follow-up surveillance of vaginal cancer 03/18/2016  . Eczema 10/31/2015  . Left hand pain 09/10/2015  . Multinodular goiter 07/26/2015  . Osteopenia 07/26/2015  . Menopause 07/26/2015  . Irritable colon 07/26/2015  . History of cancer of vagina 07/26/2015  . Diffuse cystic mastopathy 07/26/2015  . Obesity (BMI 30.0-34.9) 07/26/2015  .  Intermittent low back pain 07/26/2015  . Hyperlipemia 07/26/2015  . Osteoarthritis 05/13/2015  . Allergic rhinitis 05/13/2015  . GERD (gastroesophageal reflux disease) 05/13/2015  . Essential hypertension, benign     Past Surgical History:  Procedure Laterality Date  . BREAST EXCISIONAL BIOPSY Left    neg  . BREAST LUMPECTOMY Left   . COLONOSCOPY  08/26/2006  . COLONOSCOPY WITH PROPOFOL N/A 10/21/2016   Procedure: COLONOSCOPY WITH PROPOFOL;  Surgeon: Christene Lye, MD;  Location: [facility] ENDOSCOPY;  Service: Endoscopy;  Laterality: N/A;    Family History  Problem Relation Age of Onset  . Hypertension Father     Social History   Socioeconomic History  . Marital status: Widowed    Spouse name: Not on file  . Number of children: Not on file  . Years of education: Not on file  . Highest education level: Some college, no degree  Occupational History  . Occupation: Radio producer: [employer]  Social Needs  . Financial resource strain: Not hard at all  . Food insecurity:    Worry: Never true    Inability: Never true  . Transportation needs:    Medical: No    Non-medical: No  Tobacco Use  . Smoking status: Never Smoker  . Smokeless tobacco: Never Used  . Tobacco comment: experimented with dip snuff as a child  Substance and Sexual Activity  . Alcohol use: No  . Drug  use: No  . Sexual activity: Not Currently    Birth control/protection: Post-menopausal  Lifestyle  . Physical activity:    Days per week: 7 days    Minutes per session: 60 min  . Stress: Not at all  Relationships  . Social connections:    Talks on phone: More than three times a week    Gets together: More than three times a week    Attends religious service: More than 4 times per year    Active member of club or organization: Yes    Attends meetings of clubs or organizations: More than 4 times per year    Relationship status: Widowed  . Intimate partner violence:    Fear of current or ex  partner: No    Emotionally abused: No    Physically abused: No    Forced sexual activity: No  Other Topics Concern  . Not on file  Social History Narrative  . Not on file     Current Outpatient Medications:  .  aspirin 81 MG tablet, Take 81 mg by mouth daily. Reported on 10/10/2015, Disp: , Rfl:  .  cholecalciferol (VITAMIN D) 1000 units tablet, Take 1,000 Units by mouth daily., Disp: , Rfl:  .  desonide (DESOWEN) 0.05 % cream, Apply topically 2 (two) times daily., Disp: 60 g, Rfl: 0 .  diltiazem (CARDIZEM CD) 360 MG 24 hr capsule, Take 1 capsule (360 mg total) by mouth daily., Disp: 90 capsule, Rfl: 1 .  latanoprost (XALATAN) 0.005 % ophthalmic solution, PLACE 1 DROP IN BOTH EYES AT BEDTIME, Disp: , Rfl: 4 .  lisinopril (PRINIVIL,ZESTRIL) 20 MG tablet, Take 1 tablet (20 mg total) by mouth daily., Disp: 90 tablet, Rfl: 0 .  loratadine (CLARITIN) 10 MG tablet, Take 1 tablet (10 mg total) by mouth daily., Disp: 90 tablet, Rfl: 1 .  LORazepam (ATIVAN) 1 MG tablet, Take 1 tablet (1 mg total) by mouth 2 (two) times daily. (Patient not taking: Reported on 12/07/2017), Disp: 30 tablet, Rfl: 0 .  LUMIGAN 0.01 % SOLN, , Disp: , Rfl:  .  meclizine (ANTIVERT) 12.5 MG tablet, Take 1 tablet (12.5 mg total) by mouth 3 (three) times daily as needed for dizziness., Disp: 30 tablet, Rfl: 0 .  Multiple Vitamin (MULTIVITAMIN) tablet, Take 1 tablet by mouth daily. Reported on 10/10/2015, Disp: , Rfl:  .  Omega-3 Fatty Acids (FISH OIL CONCENTRATE PO), Take by mouth. Reported on 10/10/2015, Disp: , Rfl:  .  rosuvastatin (CRESTOR) 10 MG tablet, Take 1 tablet (10 mg total) by mouth daily., Disp: 90 tablet, Rfl: 1 .  tiZANidine (ZANAFLEX) 4 MG capsule, TAKE 1 CAPSULE (4 MG TOTAL) BY MOUTH AT BEDTIME., Disp: 30 capsule, Rfl: 0 .  traZODone (DESYREL) 50 MG tablet, Take 0.5-1 tablets (25-50 mg total) by mouth at bedtime as needed for sleep., Disp: 30 tablet, Rfl: 3 .  triamcinolone cream (KENALOG) 0.1 %, APPLY TO AFFECTED  AREA EXTERNALLY 2 TO 3 TIMES A DAY FOR RASH, Disp: 90 g, Rfl: 0 .  vitamin C (ASCORBIC ACID) 500 MG tablet, Take 500 mg by mouth daily. Reported on 10/10/2015, Disp: , Rfl:  .  vitamin E 400 UNIT capsule, Take 400 Units by mouth daily. Reported on 10/10/2015, Disp: , Rfl:   Allergies  Allergen Reactions  . Atorvastatin     difficulty concentrating and focusing  . Terbinafine And Related Dermatitis    Rash, itch, skin discoloration     ROS  Constitutional: Negative for fever or  weight change.  Respiratory: Negative for cough and shortness of breath.   Cardiovascular: Negative for chest pain or palpitations.  Gastrointestinal: Negative for abdominal pain, no bowel changes.  Musculoskeletal: Negative for gait problem or joint swelling.  Skin: Negative for rash.  Neurological: Negative for dizziness or headache.  No other specific complaints in a complete review of systems (except as listed in HPI above).  Objective  Vitals:   01/07/18 0955 01/07/18 1000  BP: (!) 142/78 (!) 142/78  Pulse: 77   Resp: 16   Temp: 98.2 F (36.8 C)   TempSrc: Oral   SpO2: 97% 99%  Weight: 176 lb 3.2 oz (79.9 kg)   Height: 5\' 1"  (1.549 m)     Body mass index is 33.29 kg/m.  Physical Exam  Constitutional: Patient appears well-developed and well-nourished. Obese. No distress.  HEENT: head atraumatic, normocephalic, pupils equal and reactive to light,  neck supple, throat within normal limits Cardiovascular: Normal rate, regular rhythm and normal heart sounds.  No murmur heard. No BLE edema. Pulmonary/Chest: Effort normal and breath sounds normal. No respiratory distress. Muscular skeletal: she has tension during palpation of nuchal area, normal rom of neck, just feels tight.  Abdominal: Soft.  There is no tenderness. Psychiatric: Patient has a normal mood and affect. behavior is normal. Judgment and thought content normal.  PHQ2/9: Depression screen [facility] 2/9 01/07/2018 12/07/2017 12/07/2017 07/27/2017  06/09/2017  Decreased Interest 0 1 0 0 0  Down, Depressed, Hopeless 0 1 0 0 0  PHQ - 2 Score 0 2 0 0 0  Altered sleeping 0 1 - - -  Tired, decreased energy 0 1 - - -  Change in appetite 0 1 - - -  Feeling bad or failure about yourself  0 0 - - -  Trouble concentrating 0 1 - - -  Moving slowly or fidgety/restless 0 0 - - -  Suicidal thoughts 0 0 - - -  PHQ-9 Score 0 6 - - -  Difficult doing work/chores Not difficult at all Somewhat difficult - - -     Fall Risk: Fall Risk  01/07/2018 12/07/2017 10/07/2017 07/27/2017 06/09/2017  Falls in the past year? No No No No No     Functional Status Survey: Is the patient deaf or have difficulty hearing?: No Does the patient have difficulty seeing, even when wearing glasses/contacts?: Yes Does the patient have difficulty concentrating, remembering, or making decisions?: Yes(Staying focus and remembering) Does the patient have difficulty walking or climbing stairs?: No Does the patient have difficulty dressing or bathing?: No Does the patient have difficulty doing errands alone such as visiting a doctor's office or shopping?: No    Assessment & Plan  1. Vertigo  - US Carotid Duplex Bilateral; Future  2. Essential hypertension, benign  She states that when she checks at home bp is at goal below 140/90 when pain on the neck is not present  - US Carotid Duplex Bilateral; Future - lisinopril (PRINIVIL,ZESTRIL) 20 MG tablet; Take 1 tablet (20 mg total) by mouth daily.  Dispense: 90 tablet; Refill: 0  3. Neck pain  Discussed taking tizanidine every night, stretch exercises, discussed PT or chiropractor care but she would like to hold off on that for now. Advise massage and foal roll to keep head in extension for 10 minutes daily

## 2018-01-07 NOTE — Patient Instructions (Signed)
Neck Exercises Neck exercises can be important for many reasons:  They can help you to improve and maintain flexibility in your neck. This can be especially important as you age.  They can help to make your neck stronger. This can make movement easier.  They can reduce or prevent neck pain.  They may help your upper back.  Ask your health care provider which neck exercises would be best for you. Exercises Neck Press Repeat this exercise 10 times. Do it first thing in the morning and right before bed or as told by your health care provider. 1. Lie on your back on a firm bed or on the floor with a pillow under your head. 2. Use your neck muscles to push your head down on the pillow and straighten your spine. 3. Hold the position as well as you can. Keep your head facing up and your chin tucked. 4. Slowly count to 5 while holding this position. 5. Relax for a few seconds. Then repeat.  Isometric Strengthening Do a full set of these exercises 2 times a day or as told by your health care provider. 1. Sit in a supportive chair and place your hand on your forehead. 2. Push forward with your head and neck while pushing back with your hand. Hold for 10 seconds. 3. Relax. Then repeat the exercise 3 times. 4. Next, do thesequence again, this time putting your hand against the back of your head. Use your head and neck to push backward against the hand pressure. 5. Finally, do the same exercise on either side of your head, pushing sideways against the pressure of your hand.  Prone Head Lifts Repeat this exercise 5 times. Do this 2 times a day or as told by your health care provider. 1. Lie face-down, resting on your elbows so that your chest and upper back are raised. 2. Start with your head facing downward, near your chest. Position your chin either on or near your chest. 3. Slowly lift your head upward. Lift until you are looking straight ahead. Then continue lifting your head as far back as  you can stretch. 4. Hold your head up for 5 seconds. Then slowly lower it to your starting position.  Supine Head Lifts Repeat this exercise 8-10 times. Do this 2 times a day or as told by your health care provider. 1. Lie on your back, bending your knees to point to the ceiling and keeping your feet flat on the floor. 2. Lift your head slowly off the floor, raising your chin toward your chest. 3. Hold for 5 seconds. 4. Relax and repeat.  Scapular Retraction Repeat this exercise 5 times. Do this 2 times a day or as told by your health care provider. 1. Stand with your arms at your sides. Look straight ahead. 2. Slowly pull both shoulders backward and downward until you feel a stretch between your shoulder blades in your upper back. 3. Hold for 10-30 seconds. 4. Relax and repeat.  Contact a health care provider if:  Your neck pain or discomfort gets much worse when you do an exercise.  Your neck pain or discomfort does not improve within 2 hours after you exercise. If you have any of these problems, stop exercising right away. Do not do the exercises again unless your health care provider says that you can. Get help right away if:  You develop sudden, severe neck pain. If this happens, stop exercising right away. Do not do the exercises again unless your   health care provider says that you can. Exercises Neck Stretch  Repeat this exercise 3-5 times. 1. Do this exercise while standing or while sitting in a chair. 2. Place your feet flat on the floor, shoulder-width apart. 3. Slowly turn your head to the right. Turn it all the way to the right so you can look over your right shoulder. Do not tilt or tip your head. 4. Hold this position for 10-30 seconds. 5. Slowly turn your head to the left, to look over your left shoulder. 6. Hold this position for 10-30 seconds.  Neck Retraction Repeat this exercise 8-10 times. Do this 3-4 times a day or as told by your health care  provider. 1. Do this exercise while standing or while sitting in a sturdy chair. 2. Look straight ahead. Do not bend your neck. 3. Use your fingers to push your chin backward. Do not bend your neck for this movement. Continue to face straight ahead. If you are doing the exercise properly, you will feel a slight sensation in your throat and a stretch at the back of your neck. 4. Hold the stretch for 1-2 seconds. Relax and repeat.  This information is not intended to replace advice given to you by your health care provider. Make sure you discuss any questions you have with your health care provider. Document Released: 04/29/2015 Document Revised: 10/24/2015 Document Reviewed: 11/26/2014 Elsevier Interactive Patient Education  2018 [employer]  

## 2018-01-17 ENCOUNTER — Ambulatory Visit
Admission: RE | Admit: 2018-01-17 | Discharge: 2018-01-17 | Disposition: A | Discharge status: Home / Self Care | Payer: Medicare Other | Source: Ambulatory Visit | Attending: Family Medicine | Admitting: Family Medicine

## 2018-01-17 DIAGNOSIS — I10 Essential (primary) hypertension: Secondary | ICD-10-CM

## 2018-01-17 DIAGNOSIS — R42 Dizziness and giddiness: Secondary | ICD-10-CM | POA: Diagnosis present

## 2018-02-28 ENCOUNTER — Other Ambulatory Visit: Payer: Self-pay | Admitting: Family Medicine

## 2018-02-28 DIAGNOSIS — I10 Essential (primary) hypertension: Secondary | ICD-10-CM

## 2018-03-09 ENCOUNTER — Encounter: Payer: Self-pay | Admitting: Family Medicine

## 2018-03-09 ENCOUNTER — Ambulatory Visit: Payer: Medicare Other | Admitting: Family Medicine

## 2018-03-09 VITALS — BP 136/82 | HR 61 | Temp 97.6°F | Resp 16 | Ht 61.0 in | Wt 171.5 lb

## 2018-03-09 DIAGNOSIS — D329 Benign neoplasm of meninges, unspecified: Secondary | ICD-10-CM

## 2018-03-09 DIAGNOSIS — B029 Zoster without complications: Secondary | ICD-10-CM

## 2018-03-09 DIAGNOSIS — L309 Dermatitis, unspecified: Secondary | ICD-10-CM | POA: Diagnosis not present

## 2018-03-09 DIAGNOSIS — R42 Dizziness and giddiness: Secondary | ICD-10-CM | POA: Diagnosis not present

## 2018-03-09 DIAGNOSIS — L308 Other specified dermatitis: Secondary | ICD-10-CM

## 2018-03-09 MED ORDER — VALACYCLOVIR HCL 1 G PO TABS: 1000.0000 mg | ORAL_TABLET | Freq: Three times a day (TID) | ORAL | 0 refills | Status: DC

## 2018-03-09 MED ORDER — TRIAMCINOLONE ACETONIDE 0.1 % EX CREA: TOPICAL_CREAM | Freq: Two times a day (BID) | CUTANEOUS | 0 refills | Status: DC

## 2018-03-09 NOTE — Progress Notes (Signed)
Name: Vickie Stewart   MRN: [id number]    DOB: 03/16/1946   Date:03/09/2018       Progress Note  Subjective  Chief Complaint  Chief Complaint  Patient presents with  . Follow-up    2 month F/U  . Dizziness    Has been better wtih her neck exercises but states it get tender  . Rash    Onset-couple of months on Right Thigh-medication cream is not getting rid of it.  . Calf Pain    Left Calf for the 2 weeks since walking daily and would like to discuss.    HPI  Vertigo: she is doing better, she has follow up with Dr. Lacinda Axon and repeat MRI Nov 2019. Seen by ENT once more since last visit and per patient mild hearing loss ( not sure of the side) had another DIx Halpike maneuver and no vertigo episodes since  HTN: bp is at goal today, taking medication and no side effects  Rash right thigh: going on for the past few weeks, very pruriginous and not responding to trimacinolone. She had shingles vaccines. Rash in crops, no blisters and denies pain.  Left lower leg pain: going to gym and walking 4-5 miles per day, she was having left proximal left leg - right below knee , described as soreness and she first got up in am over the past 1 week, she states doing better now, only mild pain during touch now.    Patient Active Problem List   Diagnosis Date Noted  . Meningioma ([place]) 10/04/2017  . Lichen sclerosus 70/35/0093  . Vaginal atrophy 03/18/2016  . Encounter for follow-up surveillance of vaginal cancer 03/18/2016  . Eczema 10/31/2015  . Left hand pain 09/10/2015  . Multinodular goiter 07/26/2015  . Osteopenia 07/26/2015  . Menopause 07/26/2015  . Irritable colon 07/26/2015  . History of cancer of vagina 07/26/2015  . Diffuse cystic mastopathy 07/26/2015  . Obesity (BMI 30.0-34.9) 07/26/2015  . Intermittent low back pain 07/26/2015  . Hyperlipemia 07/26/2015  . Osteoarthritis 05/13/2015  . Allergic rhinitis 05/13/2015  . GERD (gastroesophageal reflux disease) 05/13/2015  .  Essential hypertension, benign     Past Surgical History:  Procedure Laterality Date  . BREAST EXCISIONAL BIOPSY Left    neg  . BREAST LUMPECTOMY Left   . COLONOSCOPY  08/26/2006  . COLONOSCOPY WITH PROPOFOL N/A 10/21/2016   Procedure: COLONOSCOPY WITH PROPOFOL;  Surgeon: Christene Lye, MD;  Location: [facility] ENDOSCOPY;  Service: Endoscopy;  Laterality: N/A;    Family History  Problem Relation Age of Onset  . Hypertension Father     Social History   Socioeconomic History  . Marital status: Widowed    Spouse name: Not on file  . Number of children: Not on file  . Years of education: Not on file  . Highest education level: Some college, no degree  Occupational History  . Occupation: Radio producer: [employer]  Social Needs  . Financial resource strain: Not hard at all  . Food insecurity:    Worry: Never true    Inability: Never true  . Transportation needs:    Medical: No    Non-medical: No  Tobacco Use  . Smoking status: Never Smoker  . Smokeless tobacco: Never Used  . Tobacco comment: experimented with dip snuff as a child  Substance and Sexual Activity  . Alcohol use: No  . Drug use: No  . Sexual activity: Not Currently    Birth control/protection: Post-menopausal  Lifestyle  . Physical activity:    Days per week: 7 days    Minutes per session: 60 min  . Stress: Not at all  Relationships  . Social connections:    Talks on phone: More than three times a week    Gets together: More than three times a week    Attends religious service: More than 4 times per year    Active member of club or organization: Yes    Attends meetings of clubs or organizations: More than 4 times per year    Relationship status: Widowed  . Intimate partner violence:    Fear of current or ex partner: No    Emotionally abused: No    Physically abused: No    Forced sexual activity: No  Other Topics Concern  . Not on file  Social History Narrative  . Not on file      Current Outpatient Medications:  .  aspirin 81 MG tablet, Take 81 mg by mouth daily. Reported on 10/10/2015, Disp: , Rfl:  .  cholecalciferol (VITAMIN D) 1000 units tablet, Take 1,000 Units by mouth daily., Disp: , Rfl:  .  desonide (DESOWEN) 0.05 % cream, Apply topically 2 (two) times daily., Disp: 60 g, Rfl: 0 .  diltiazem (CARDIZEM CD) 360 MG 24 hr capsule, Take 1 capsule (360 mg total) by mouth daily., Disp: 90 capsule, Rfl: 1 .  latanoprost (XALATAN) 0.005 % ophthalmic solution, PLACE 1 DROP IN BOTH EYES AT BEDTIME, Disp: , Rfl: 4 .  lisinopril (PRINIVIL,ZESTRIL) 20 MG tablet, Take 1 tablet (20 mg total) by mouth daily., Disp: 90 tablet, Rfl: 0 .  loratadine (CLARITIN) 10 MG tablet, Take 1 tablet (10 mg total) by mouth daily., Disp: 90 tablet, Rfl: 1 .  LORazepam (ATIVAN) 1 MG tablet, Take 1 tablet (1 mg total) by mouth 2 (two) times daily., Disp: 30 tablet, Rfl: 0 .  LUMIGAN 0.01 % SOLN, , Disp: , Rfl:  .  meclizine (ANTIVERT) 12.5 MG tablet, Take 1 tablet (12.5 mg total) by mouth 3 (three) times daily as needed for dizziness., Disp: 30 tablet, Rfl: 0 .  Multiple Vitamin (MULTIVITAMIN) tablet, Take 1 tablet by mouth daily. Reported on 10/10/2015, Disp: , Rfl:  .  Omega-3 Fatty Acids (FISH OIL CONCENTRATE PO), Take by mouth. Reported on 10/10/2015, Disp: , Rfl:  .  rosuvastatin (CRESTOR) 10 MG tablet, Take 1 tablet (10 mg total) by mouth daily., Disp: 90 tablet, Rfl: 1 .  tiZANidine (ZANAFLEX) 4 MG capsule, TAKE 1 CAPSULE (4 MG TOTAL) BY MOUTH AT BEDTIME., Disp: 30 capsule, Rfl: 0 .  traZODone (DESYREL) 50 MG tablet, Take 0.5-1 tablets (25-50 mg total) by mouth at bedtime as needed for sleep., Disp: 30 tablet, Rfl: 3 .  triamcinolone cream (KENALOG) 0.1 %, APPLY TO AFFECTED AREA EXTERNALLY 2 TO 3 TIMES A DAY FOR RASH, Disp: 90 g, Rfl: 0 .  vitamin C (ASCORBIC ACID) 500 MG tablet, Take 500 mg by mouth daily. Reported on 10/10/2015, Disp: , Rfl:  .  vitamin E 400 UNIT capsule, Take 400 Units  by mouth daily. Reported on 10/10/2015, Disp: , Rfl:   Allergies  Allergen Reactions  . Atorvastatin     difficulty concentrating and focusing  . Terbinafine And Related Dermatitis    Rash, itch, skin discoloration    I personally reviewed active problem list, medication list, allergies, family history, social history with the patient/caregiver today.   ROS  Constitutional: Negative for fever , positive for mild  weight change.  Respiratory: Negative for cough and shortness of breath.   Cardiovascular: Negative for chest pain or palpitations.  Gastrointestinal: Negative for abdominal pain, no bowel changes.  Musculoskeletal: Negative for gait problem or joint swelling.  Skin: Negative for rash.  Neurological: positive  For intermittent  dizziness but no  headache.  No other specific complaints in a complete review of systems (except as listed in HPI above).  Objective  Vitals:   03/09/18 0936  BP: 136/82  Pulse: 61  Resp: 16  Temp: 97.6 F (36.4 C)  TempSrc: Oral  SpO2: 98%  Weight: 171 lb 8 oz (77.8 kg)  Height: 5\' 1"  (1.549 m)    Body mass index is 32.4 kg/m.  Physical Exam  Constitutional: Patient appears well-developed and well-nourished. Obese No distress.  HEENT: head atraumatic, normocephalic, pupils equal and reactive to light, ears normal TM bilateral , neck supple, throat within normal limits Cardiovascular: Normal rate, regular rhythm and normal heart sounds.  No murmur heard. No BLE edema. Pulmonary/Chest: Effort normal and breath sounds normal. No respiratory distress. Abdominal: Soft.  There is no tenderness. Skin: erythematous rash in crops no oozing. Extending in a dermatome going to anterior thigh L2 dermatome Psychiatric: Patient has a normal mood and affect. behavior is normal. Judgment and thought content normal.  PHQ2/9: Depression screen [facility] 2/9 03/09/2018 01/07/2018 12/07/2017 12/07/2017 07/27/2017  Decreased Interest 0 0 1 0 0  Down, Depressed,  Hopeless 0 0 1 0 0  PHQ - 2 Score 0 0 2 0 0  Altered sleeping 1 0 1 - -  Tired, decreased energy 1 0 1 - -  Change in appetite 1 0 1 - -  Feeling bad or failure about yourself  0 0 0 - -  Trouble concentrating 1 0 1 - -  Moving slowly or fidgety/restless 0 0 0 - -  Suicidal thoughts 0 0 0 - -  PHQ-9 Score 4 0 6 - -  Difficult doing work/chores - Not difficult at all Somewhat difficult - -     Fall Risk: Fall Risk  03/09/2018 01/07/2018 12/07/2017 10/07/2017 07/27/2017  Falls in the past year? No No No No No     Functional Status Survey: Is the patient deaf or have difficulty hearing?: No Does the patient have difficulty seeing, even when wearing glasses/contacts?: Yes Does the patient have difficulty concentrating, remembering, or making decisions?: No Does the patient have difficulty walking or climbing stairs?: No Does the patient have difficulty dressing or bathing?: No Does the patient have difficulty doing errands alone such as visiting a doctor's office or shopping?: No    Assessment & Plan  1. Vertigo  Doing better, keep follow up with Dr. Lacinda Axon   2. Meningioma ([place])  MRI scheduled already  3. Herpes zoster without complication  - valACYclovir (VALTREX) 1000 MG tablet; Take 1 tablet (1,000 mg total) by mouth 3 (three) times daily.  Dispense: 30 tablet; Refill: 0  4. Eczema of face  Doing well at this time  5. Other eczema  - triamcinolone cream (KENALOG) 0.1 %; Apply topically 2 (two) times daily.  Dispense: 90 g; Refill: 0

## 2018-03-23 ENCOUNTER — Other Ambulatory Visit: Payer: Self-pay | Admitting: Family Medicine

## 2018-03-23 DIAGNOSIS — B029 Zoster without complications: Secondary | ICD-10-CM

## 2018-04-02 ENCOUNTER — Other Ambulatory Visit: Payer: Self-pay | Admitting: Family Medicine

## 2018-04-02 DIAGNOSIS — I10 Essential (primary) hypertension: Secondary | ICD-10-CM

## 2018-04-02 DIAGNOSIS — G4709 Other insomnia: Secondary | ICD-10-CM

## 2018-04-06 ENCOUNTER — Ambulatory Visit
Admission: RE | Admit: 2018-04-06 | Discharge: 2018-04-06 | Disposition: A | Discharge status: Home / Self Care | Payer: Medicare Other | Source: Ambulatory Visit | Attending: Neurosurgery | Admitting: Neurosurgery

## 2018-04-06 DIAGNOSIS — D329 Benign neoplasm of meninges, unspecified: Secondary | ICD-10-CM | POA: Diagnosis not present

## 2018-04-06 LAB — POCT I-STAT CREATININE: CREATININE: 0.7 mg/dL (ref 0.44–1.00)

## 2018-04-06 MED ORDER — GADOBUTROL 1 MMOL/ML IV SOLN
7.5000 mL | Freq: Once | INTRAVENOUS | Status: AC | PRN
  Administered 2018-04-06: 7.5 mL via INTRAVENOUS

## 2018-05-03 ENCOUNTER — Other Ambulatory Visit: Payer: Self-pay | Admitting: Family Medicine

## 2018-07-08 ENCOUNTER — Other Ambulatory Visit: Payer: Self-pay | Admitting: Family Medicine

## 2018-07-08 DIAGNOSIS — I10 Essential (primary) hypertension: Secondary | ICD-10-CM

## 2018-07-08 NOTE — Telephone Encounter (Signed)
Hypertension medication request: Lisinopril to [facility]  Last office visit pertaining to hypertension: 03/09/2018   BP Readings from Last 3 Encounters:  03/09/18 136/82  01/07/18 (!) 142/78  12/07/17 (!) 158/76    Lab Results  Component Value Date   CREATININE 0.70 04/06/2018   BUN 16 09/25/2017   NA 140 09/25/2017   K 3.7 09/25/2017   CL 106 09/25/2017   CO2 23 09/25/2017   Follow up on 07/29/2018

## 2018-07-09 ENCOUNTER — Other Ambulatory Visit: Payer: Self-pay | Admitting: Family Medicine

## 2018-07-09 DIAGNOSIS — E7800 Pure hypercholesterolemia, unspecified: Secondary | ICD-10-CM

## 2018-07-14 ENCOUNTER — Ambulatory Visit (INDEPENDENT_AMBULATORY_CARE_PROVIDER_SITE_OTHER): Payer: Medicare Other

## 2018-07-14 VITALS — BP 144/80 | HR 52 | Temp 97.6°F | Resp 16 | Ht 61.0 in | Wt 170.4 lb

## 2018-07-14 DIAGNOSIS — Z1231 Encounter for screening mammogram for malignant neoplasm of breast: Secondary | ICD-10-CM

## 2018-07-14 DIAGNOSIS — Z0000 Encounter for general adult medical examination without abnormal findings: Secondary | ICD-10-CM | POA: Diagnosis not present

## 2018-07-14 NOTE — Patient Instructions (Signed)
Vickie Stewart , Thank you for taking time to come for your Medicare Wellness Visit. I appreciate your ongoing commitment to your health goals. Please review the following plan we discussed and let me know if I can assist you in the future.   Screening recommendations/referrals: Colonoscopy: done 10/21/16. Repeat in 10 years.  Mammogram: done 08/06/17. Please call [phone] to schedule your mammogram.  Bone Density: done 08/26/17. Repeat in 2021. Recommended yearly ophthalmology/optometry visit for glaucoma screening and checkup Recommended yearly dental visit for hygiene and checkup  Vaccinations: Influenza vaccine: done 02/16/18 Pneumococcal vaccine: done 07/26/15 Tdap vaccine: done 02/26/11 Shingles vaccine: Shingrix discussed. Please contact your pharmacy for coverage information.     Advanced directives: Advance directive discussed with you today. I have provided a copy for you to complete at home and have notarized. Once this is complete please bring a copy in to our office so we can scan it into your chart. Conditions/risks identified: Keep up the great work!  Next appointment: Please follow up in one year for your Medicare Annual Wellness visit.     Preventive Care 71 Years and Older, Female Preventive care refers to lifestyle choices and visits with your health care provider that can promote health and wellness. What does preventive care include?  A yearly physical exam. This is also called an annual well check.  Dental exams once or twice a year.  Routine eye exams. Ask your health care provider how often you should have your eyes checked.  Personal lifestyle choices, including:  Daily care of your teeth and gums.  Regular physical activity.  Eating a healthy diet.  Avoiding tobacco and drug use.  Limiting alcohol use.  Practicing safe sex.  Taking low-dose aspirin every day.  Taking vitamin and mineral supplements as recommended by your health care provider. What  happens during an annual well check? The services and screenings done by your health care provider during your annual well check will depend on your age, overall health, lifestyle risk factors, and family history of disease. Counseling  Your health care provider may ask you questions about your:  Alcohol use.  Tobacco use.  Drug use.  Emotional well-being.  Home and relationship well-being.  Sexual activity.  Eating habits.  History of falls.  Memory and ability to understand (cognition).  Work and work Statistician.  Reproductive health. Screening  You may have the following tests or measurements:  Height, weight, and BMI.  Blood pressure.  Lipid and cholesterol levels. These may be checked every 5 years, or more frequently if you are over 30 years old.  Skin check.  Lung cancer screening. You may have this screening every year starting at age 61 if you have a 30-pack-year history of smoking and currently smoke or have quit within the past 15 years.  Fecal occult blood test (FOBT) of the stool. You may have this test every year starting at age 4.  Flexible sigmoidoscopy or colonoscopy. You may have a sigmoidoscopy every 5 years or a colonoscopy every 10 years starting at age 21.  Hepatitis C blood test.  Hepatitis B blood test.  Sexually transmitted disease (STD) testing.  Diabetes screening. This is done by checking your blood sugar (glucose) after you have not eaten for a while (fasting). You may have this done every 1-3 years.  Bone density scan. This is done to screen for osteoporosis. You may have this done starting at age 20.  Mammogram. This may be done every 1-2 years. Talk to your  health care provider about how often you should have regular mammograms. Talk with your health care provider about your test results, treatment options, and if necessary, the need for more tests. Vaccines  Your health care provider may recommend certain vaccines, such  as:  Influenza vaccine. This is recommended every year.  Tetanus, diphtheria, and acellular pertussis (Tdap, Td) vaccine. You may need a Td booster every 10 years.  Zoster vaccine. You may need this after age 93.  Pneumococcal 13-valent conjugate (PCV13) vaccine. One dose is recommended after age 80.  Pneumococcal polysaccharide (PPSV23) vaccine. One dose is recommended after age 53. Talk to your health care provider about which screenings and vaccines you need and how often you need them. This information is not intended to replace advice given to you by your health care provider. Make sure you discuss any questions you have with your health care provider. Document Released: 06/14/2015 Document Revised: 02/05/2016 Document Reviewed: 03/19/2015 Elsevier Interactive Patient Education  2017 [place] Prevention in the Home Falls can cause injuries. They can happen to people of all ages. There are many things you can do to make your home safe and to help prevent falls. What can I do on the outside of my home?  Regularly fix the edges of walkways and driveways and fix any cracks.  Remove anything that might make you trip as you walk through a door, such as a raised step or threshold.  Trim any bushes or trees on the path to your home.  Use bright outdoor lighting.  Clear any walking paths of anything that might make someone trip, such as rocks or tools.  Regularly check to see if handrails are loose or broken. Make sure that both sides of any steps have handrails.  Any raised decks and porches should have guardrails on the edges.  Have any leaves, snow, or ice cleared regularly.  Use sand or salt on walking paths during winter.  Clean up any spills in your garage right away. This includes oil or grease spills. What can I do in the bathroom?  Use night lights.  Install grab bars by the toilet and in the tub and shower. Do not use towel bars as grab bars.  Use  non-skid mats or decals in the tub or shower.  If you need to sit down in the shower, use a plastic, non-slip stool.  Keep the floor dry. Clean up any water that spills on the floor as soon as it happens.  Remove soap buildup in the tub or shower regularly.  Attach bath mats securely with double-sided non-slip rug tape.  Do not have throw rugs and other things on the floor that can make you trip. What can I do in the bedroom?  Use night lights.  Make sure that you have a light by your bed that is easy to reach.  Do not use any sheets or blankets that are too big for your bed. They should not hang down onto the floor.  Have a firm chair that has side arms. You can use this for support while you get dressed.  Do not have throw rugs and other things on the floor that can make you trip. What can I do in the kitchen?  Clean up any spills right away.  Avoid walking on wet floors.  Keep items that you use a lot in easy-to-reach places.  If you need to reach something above you, use a strong step stool that has a  grab bar.  Keep electrical cords out of the way.  Do not use floor polish or wax that makes floors slippery. If you must use wax, use non-skid floor wax.  Do not have throw rugs and other things on the floor that can make you trip. What can I do with my stairs?  Do not leave any items on the stairs.  Make sure that there are handrails on both sides of the stairs and use them. Fix handrails that are broken or loose. Make sure that handrails are as long as the stairways.  Check any carpeting to make sure that it is firmly attached to the stairs. Fix any carpet that is loose or worn.  Avoid having throw rugs at the top or bottom of the stairs. If you do have throw rugs, attach them to the floor with carpet tape.  Make sure that you have a light switch at the top of the stairs and the bottom of the stairs. If you do not have them, ask someone to add them for you. What  else can I do to help prevent falls?  Wear shoes that:  Do not have high heels.  Have rubber bottoms.  Are comfortable and fit you well.  Are closed at the toe. Do not wear sandals.  If you use a stepladder:  Make sure that it is fully opened. Do not climb a closed stepladder.  Make sure that both sides of the stepladder are locked into place.  Ask someone to hold it for you, if possible.  Clearly mark and make sure that you can see:  Any grab bars or handrails.  First and last steps.  Where the edge of each step is.  Use tools that help you move around (mobility aids) if they are needed. These include:  Canes.  Walkers.  Scooters.  Crutches.  Turn on the lights when you go into a dark area. Replace any light bulbs as soon as they burn out.  Set up your furniture so you have a clear path. Avoid moving your furniture around.  If any of your floors are uneven, fix them.  If there are any pets around you, be aware of where they are.  Review your medicines with your doctor. Some medicines can make you feel dizzy. This can increase your chance of falling. Ask your doctor what other things that you can do to help prevent falls. This information is not intended to replace advice given to you by your health care provider. Make sure you discuss any questions you have with your health care provider. Document Released: 03/14/2009 Document Revised: 10/24/2015 Document Reviewed: 06/22/2014 Elsevier Interactive Patient Education  2017 [employer].

## 2018-07-14 NOTE — Progress Notes (Addendum)
Subjective:   Vickie Stewart is a 73 y.o. female who presents for Medicare Annual (Subsequent) preventive examination.  Review of Systems:   Cardiac Risk Factors include: advanced age (>44men, >67 women);hypertension;dyslipidemia;obesity (BMI >30kg/m2)     Objective:     Vitals: BP (!) 144/80 (BP Location: Left Arm, Patient Position: Sitting, Cuff Size: Normal)   Pulse (!) 52   Temp 97.6 F (36.4 C) (Oral)   Resp 16   Ht 5\' 1"  (1.549 m)   Wt 170 lb 6.4 oz (77.3 kg)   SpO2 95%   BMI 32.20 kg/m   Body mass index is 32.2 kg/m.  Advanced Directives 07/14/2018 12/07/2016 10/21/2016 06/02/2016 01/23/2016 10/10/2015 09/10/2015  Does Patient Have a Medical Advance Directive? No Yes No Yes No Yes Yes  Type of Advance Directive - Living will - [place];Living will - [place];Living will [place];Living will  Does patient want to make changes to medical advance directive? - - - - - No - Patient declined -  Copy of [place] in Chart? - - - No - copy requested - No - copy requested No - copy requested  Would patient like information on creating a medical advance directive? Yes (MAU/Ambulatory/Procedural Areas - Information given) - No - Patient declined - No - patient declined information No - patient declined information No - patient declined information    Tobacco Social History   Tobacco Use  Smoking Status Never Smoker  Smokeless Tobacco Never Used  Tobacco Comment   experimented with dip snuff as a child     Counseling given: Not Answered Comment: experimented with dip snuff as a child   Clinical Intake:  Pre-visit preparation completed: Yes  Pain : 0-10 Pain Score: 4  Pain Type: Acute pain Pain Location: Face(sinus pain) Pain Orientation: Upper Pain Descriptors / Indicators: Aching, Dull, Pressure Pain Onset: In the past 7 days Pain Frequency: Intermittent     BMI - recorded:  32.2 Nutritional Status: BMI > 30  Obese Nutritional Risks: None Diabetes: No  How often do you need to have someone help you when you read instructions, pamphlets, or other written materials from your doctor or pharmacy?: 1 - Never What is the last grade level you completed in school?: some college  Interpreter Needed?: No  Information entered by :: Vickie Marker LPN  Past Medical History:  Diagnosis Date  . Allergic rhinitis, cause unspecified   . Anxiety state, unspecified   . Contact dermatitis and other eczema, due to unspecified cause   . Diffuse cystic mastopathy   . Dyspepsia and other specified disorders of function of stomach   . Essential hypertension, benign   . Glaucoma   . Irritable bowel syndrome   . Lateral epicondylitis  of elbow   . Leukocytopenia, unspecified   . Mastodynia   . Obesity, unspecified   . Pure hypercholesterolemia   . Reflux esophagitis   . Thoracic or lumbosacral neuritis or radiculitis, unspecified   . Unspecified disorder of skin and subcutaneous tissue   . Vaginal cancer [facility])    History   Past Surgical History:  Procedure Laterality Date  . BREAST EXCISIONAL BIOPSY Left    neg  . BREAST LUMPECTOMY Left   . COLONOSCOPY  08/26/2006  . COLONOSCOPY WITH PROPOFOL N/A 10/21/2016   Procedure: COLONOSCOPY WITH PROPOFOL;  Surgeon: Christene Lye, MD;  Location: [facility] ENDOSCOPY;  Service: Endoscopy;  Laterality: N/A;   Family History  Problem Relation Age of Onset  . Hypertension Father    Social History   Socioeconomic History  . Marital status: Widowed    Spouse name: Not on file  . Number of children: 0  . Years of education: Not on file  . Highest education level: Some college, no degree  Occupational History  . Occupation: Radio producer: [employer]  Social Needs  . Financial resource strain: Not hard at all  . Food insecurity:    Worry: Never true    Inability: Never true  . Transportation needs:    Medical: No     Non-medical: No  Tobacco Use  . Smoking status: Never Smoker  . Smokeless tobacco: Never Used  . Tobacco comment: experimented with dip snuff as a child  Substance and Sexual Activity  . Alcohol use: No  . Drug use: No  . Sexual activity: Not Currently    Birth control/protection: Post-menopausal  Lifestyle  . Physical activity:    Days per week: 5 days    Minutes per session: 60 min  . Stress: Only a little  Relationships  . Social connections:    Talks on phone: More than three times a week    Gets together: More than three times a week    Attends religious service: More than 4 times per year    Active member of club or organization: Yes    Attends meetings of clubs or organizations: More than 4 times per year    Relationship status: Widowed  Other Topics Concern  . Not on file  Social History Narrative  . Not on file    Outpatient Encounter Medications as of 07/14/2018  Medication Sig  . aspirin 81 MG tablet Take 81 mg by mouth daily. Reported on 10/10/2015  . cholecalciferol (VITAMIN D) 1000 units tablet Take 1,000 Units by mouth daily.  Marland Kitchen desonide (DESOWEN) 0.05 % cream Apply topically 2 (two) times daily.  Marland Kitchen diltiazem (CARDIZEM CD) 360 MG 24 hr capsule Take 1 capsule (360 mg total) by mouth daily.  Marland Kitchen latanoprost (XALATAN) 0.005 % ophthalmic solution PLACE 1 DROP IN BOTH EYES AT BEDTIME  . lisinopril (PRINIVIL,ZESTRIL) 20 MG tablet TAKE 1 TABLET BY MOUTH EVERY DAY  . Multiple Vitamin (MULTIVITAMIN) tablet Take 1 tablet by mouth daily. Reported on 10/10/2015  . Omega-3 Fatty Acids (FISH OIL CONCENTRATE PO) Take by mouth. Reported on 10/10/2015  . rosuvastatin (CRESTOR) 10 MG tablet TAKE 1 TABLET BY MOUTH EVERY DAY  . tiZANidine (ZANAFLEX) 4 MG capsule TAKE 1 CAPSULE (4 MG TOTAL) BY MOUTH AT BEDTIME.  . traZODone (DESYREL) 50 MG tablet Take 0.5-1 tablets (25-50 mg total) by mouth at bedtime as needed for sleep.  Marland Kitchen triamcinolone cream (KENALOG) 0.1 % APPLY TO AFFECTED AREA  EXTERNALLY 2 TO 3 TIMES A DAY FOR RASH  . vitamin C (ASCORBIC ACID) 500 MG tablet Take 500 mg by mouth daily. Reported on 10/10/2015  . vitamin E 400 UNIT capsule Take 400 Units by mouth daily. Reported on 10/10/2015  . loratadine (CLARITIN) 10 MG tablet Take 1 tablet (10 mg total) by mouth daily. (Patient not taking: Reported on 07/14/2018)  . LORazepam (ATIVAN) 1 MG tablet Take 1 tablet (1 mg total) by mouth 2 (two) times daily. (Patient not taking: Reported on 07/14/2018)  . meclizine (ANTIVERT) 12.5 MG tablet Take 1 tablet (12.5 mg total) by mouth 3 (three) times daily as needed for dizziness. (Patient not taking: Reported on 07/14/2018)  . valACYclovir (  VALTREX) 1000 MG tablet TAKE 1 TABLET BY MOUTH THREE TIMES A DAY (Patient not taking: Reported on 07/14/2018)  . [DISCONTINUED] LUMIGAN 0.01 % SOLN    No facility-administered encounter medications on file as of 07/14/2018.     Activities of Daily Living In your present state of health, do you have any difficulty performing the following activities: 07/14/2018 03/09/2018  Hearing? Y N  Comment mild difficulty with background noise; declines hearing aids -  Vision? N Y  Comment wears glasses -  Difficulty concentrating or making decisions? N N  Comment - -  Walking or climbing stairs? N N  Dressing or bathing? N N  Doing errands, shopping? N N  Preparing Food and eating ? N -  Using the Toilet? N -  In the past six months, have you accidently leaked urine? N -  Do you have problems with loss of bowel control? N -  Managing your Medications? N -  Managing your Finances? N -  Housekeeping or managing your Housekeeping? N -  Some recent data might be hidden    Patient Care Team: Steele Sizer, MD as PCP - General (Family Medicine) Christene Lye, MD (General Surgery) Gabriel Carina Betsey Holiday, MD as Physician Assistant (Endocrinology)    Assessment:   This is a routine wellness examination for Kameryn.  Exercise Activities and Dietary  recommendations Current Exercise Habits: Structured exercise class, Type of exercise: walking;treadmill;strength training/weights, Time (Minutes): 60, Frequency (Times/Week): 5, Weekly Exercise (Minutes/Week): 300, Intensity: Moderate, Exercise limited by: None identified  Goals    . Weight (lb) < 160 lb (72.6 kg)     Pt would like to lose 10 lbs over the next year       Fall Risk Fall Risk  07/14/2018 03/09/2018 01/07/2018 12/07/2017 10/07/2017  Falls in the past year? 0 No No No No  Number falls in past yr: 0 - - - -  Injury with Fall? 0 - - - -  Follow up Falls prevention discussed - - - -   FALL RISK PREVENTION PERTAINING TO THE HOME:  Any stairs in or around the home? Yes  If so, are they are without handrails? Yes   Home free of loose throw rugs in walkways, pet beds, electrical cords, etc? Yes  Adequate lighting in your home to reduce risk of falls? Yes   ASSISTIVE DEVICES UTILIZED TO PREVENT FALLS:  Life alert? No  Use of a cane, walker or w/c? No  Grab bars in the bathroom? No  Shower chair or bench in shower? No  Elevated toilet seat or a handicapped toilet? No   DME ORDERS:  DME order needed?  No   TIMED UP AND GO:  Was the test performed? Yes .  Length of time to ambulate 10 feet: 5 sec.   GAIT:  Appearance of gait: Gait stead-fast and without the use of an assistive device.   Education: Fall risk prevention has been discussed.  Intervention(s) required? No   Depression Screen PHQ 2/9 Scores 07/14/2018 03/09/2018 01/07/2018 12/07/2017  PHQ - 2 Score 0 0 0 2  PHQ- 9 Score - 4 0 6     Cognitive Function     6CIT Screen 07/14/2018  What Year? 0 points  What month? 0 points  What time? 0 points  Count back from 20 0 points  Months in reverse 2 points  Repeat phrase 2 points  Total Score 4    Immunization History  Administered Date(s) Administered  . Influenza  Split 07/23/2008, 03/06/2010  . Influenza, High Dose Seasonal PF 02/16/2018  . Influenza,  Seasonal, Injecte, Preservative Fre 02/26/2011  . Influenza,inj,Quad PF,6+ Mos 02/14/2015  . Influenza-Unspecified 02/26/2011, 01/11/2016, 02/13/2017  . Pneumococcal Conjugate-13 07/26/2015  . Pneumococcal Polysaccharide-23 04/12/2007, 06/19/2013  . Tdap 02/26/2011  . Zoster 02/26/2011    Qualifies for Shingles Vaccine? Yes  Zostavax completed 2012. Due for Shingrix. Education has been provided regarding the importance of this vaccine. Pt has been advised to call insurance company to determine out of pocket expense. Advised may also receive vaccine at local pharmacy or Health Dept. Verbalized acceptance and understanding.  Tdap: Up to date  Flu Vaccine: Up to date  Pneumococcal Vaccine: Up to date   Screening Tests Health Maintenance  Topic Date Due  . MAMMOGRAM  08/07/2019  . TETANUS/TDAP  02/25/2021  . COLONOSCOPY  10/22/2026  . INFLUENZA VACCINE  Completed  . DEXA SCAN  Completed  . Hepatitis C Screening  Completed  . PNA vac Low Risk Adult  Completed    Cancer Screenings:   Colorectal Screening: Completed 10/21/16. Repeat every 10 years;   Mammogram: Completed 08/06/17. Repeat every year;  Ordered today. Pt provided with contact information and advised to call to schedule appt.   Bone Density: Completed 08/26/17. Results reflect  OSTEOPENIA. Repeat every 2 years.   Lung Cancer Screening: (Low Dose CT Chest recommended if Age 20-80 years, 30 pack-year currently smoking OR have quit w/in 15years.) does not qualify.   Additional Screening:  Hepatitis C Screening: does qualify; Completed 04/26/13  Vision Screening: Recommended annual ophthalmology exams for early detection of glaucoma and other disorders of the eye. Is the patient up to date with their annual eye exam?  Yes  Who is the provider or what is the name of the office in which the pt attends annual eye exams? Dr. Gloriann Loan   Dental Screening: Recommended annual dental exams for proper oral hygiene  Community  Resource Referral:  CRR required this visit?  No      Plan:     I have personally reviewed and addressed the Medicare Annual Wellness questionnaire and have noted the following in the patient's chart:  A. Medical and social history B. Use of alcohol, tobacco or illicit drugs  C. Current medications and supplements D. Functional ability and status E.  Nutritional status F.  Physical activity G. Advance directives H. List of other physicians I.  Hospitalizations, surgeries, and ER visits in previous 12 months J.  [place] such as hearing and vision if needed, cognitive and depression L. Referrals and appointments   In addition, I have reviewed and discussed with patient certain preventive protocols, quality metrics, and best practice recommendations. A written personalized care plan for preventive services as well as general preventive health recommendations were provided to patient.   Signed,  Vickie Marker, LPN Nurse Health Advisor   Nurse Notes: pt doing well and appreciative of visit today  I have reviewed this encounter including the documentation in this note and/or discussed this patient with the provider, Vickie Marker, LPN. I am certifying that I agree with the content of this note as supervising physician.  Steele Sizer, MD [facility] 07/14/2018, 11:24 AM

## 2018-07-29 ENCOUNTER — Ambulatory Visit (INDEPENDENT_AMBULATORY_CARE_PROVIDER_SITE_OTHER): Payer: Medicare Other | Admitting: Family Medicine

## 2018-07-29 ENCOUNTER — Encounter: Payer: Self-pay | Admitting: Family Medicine

## 2018-07-29 VITALS — BP 128/60 | HR 64 | Temp 97.6°F | Resp 16 | Ht 61.25 in | Wt 169.7 lb

## 2018-07-29 DIAGNOSIS — Z01419 Encounter for gynecological examination (general) (routine) without abnormal findings: Secondary | ICD-10-CM

## 2018-07-29 NOTE — Addendum Note (Signed)
Addended by: Steele Sizer F on: 07/29/2018 10:09 AM   Modules accepted: Orders

## 2018-07-29 NOTE — Progress Notes (Signed)
Name: Vickie Stewart   MRN: [id number]    DOB: 1946/01/27   Date:07/29/2018       Progress Note  Subjective  Chief Complaint  Chief Complaint  Patient presents with  . Annual Exam    HPI   Patient presents for annual CPE   Diet: eating healthy, avoiding fast food Exercise: going to the gym   USPSTF grade A and B recommendations    Office Visit from 01/07/2018 in [facility]  AUDIT-C Score  0     Depression:  Depression screen [facility] 2/9 07/29/2018 07/14/2018 03/09/2018 01/07/2018 12/07/2017  Decreased Interest 0 0 0 0 1  Down, Depressed, Hopeless 0 0 0 0 1  PHQ - 2 Score 0 0 0 0 2  Altered sleeping 1 - 1 0 1  Tired, decreased energy 0 - 1 0 1  Change in appetite 1 - 1 0 1  Feeling bad or failure about yourself  0 - 0 0 0  Trouble concentrating 1 - 1 0 1  Moving slowly or fidgety/restless 0 - 0 0 0  Suicidal thoughts 0 - 0 0 0  PHQ-9 Score 3 - 4 0 6  Difficult doing work/chores Not difficult at all - - Not difficult at all Somewhat difficult   Hypertension: BP Readings from Last 3 Encounters:  07/29/18 128/60  07/14/18 (!) 144/80  03/09/18 136/82   Obesity: Wt Readings from Last 3 Encounters:  07/29/18 169 lb 11.2 oz (77 kg)  07/14/18 170 lb 6.4 oz (77.3 kg)  03/09/18 171 lb 8 oz (77.8 kg)   BMI Readings from Last 3 Encounters:  07/29/18 31.80 kg/m  07/14/18 32.20 kg/m  03/09/18 32.40 kg/m    Hep C Screening: 2017 STD testing and prevention (HIV/chl/gon/syphilis): N/A Intimate partner violence: negative screen  Sexual History/Pain during Intercourse: not sexually active in 10 years since she became a widow Menstrual History/LMP/Abnormal Bleeding: no post-menopausal bleeding  Incontinence Symptoms: no symptoms   Advanced Care Planning: A voluntary discussion about advance care planning including the explanation and discussion of advance directives.  Discussed health care proxy and Living will, and the patient was able to identify a health  care proxy as brother - Glynn Octave .  Patient does not have a living will at present time.   Breast cancer:  HM Mammogram  Date Value Ref Range Status  06/01/2014 Normal  Final    BRCA gene screening: N/A Cervical cancer screening: up to date, last one in 2019 normal pap and negative HPV  Osteoporosis Screening: 2019   Lipids:  Lab Results  Component Value Date   CHOL 141 07/09/2017   CHOL 153 05/27/2016   CHOL 216 (H) 07/26/2015   Lab Results  Component Value Date   HDL 59 07/09/2017   HDL 57 05/27/2016   HDL 54 07/26/2015   Lab Results  Component Value Date   LDLCALC 67 07/09/2017   LDLCALC 76 05/27/2016   LDLCALC 140 (H) 07/26/2015   Lab Results  Component Value Date   TRIG 70 07/09/2017   TRIG 100 05/27/2016   TRIG 108 07/26/2015   Lab Results  Component Value Date   CHOLHDL 2.4 07/09/2017   CHOLHDL 4.0 07/26/2015   CHOLHDL 4.0 02/18/2015   No results found for: LDLDIRECT  Glucose:  Glucose  Date Value Ref Range Status  05/27/2016 85 65 - 99 mg/dL Final  07/26/2015 87 65 - 99 mg/dL Final  02/18/2015 89 65 - 99 mg/dL Final  Glucose, Bld  Date Value Ref Range Status  09/25/2017 102 (H) 65 - 99 mg/dL Final  07/09/2017 94 65 - 99 mg/dL Final    Comment:    .            Fasting reference interval .     Skin cancer: discussed atypical lesions  Colorectal cancer: 09/2016 Lung cancer:   Low Dose CT Chest recommended if Age 89-80 years, 30 pack-year currently smoking OR have quit w/in 15years. Patient does not qualify.   ECG: 08/2017   Patient Active Problem List   Diagnosis Date Noted  . Meningioma ([place]) 10/04/2017  . Lichen sclerosus 10/62/6948  . Vaginal atrophy 03/18/2016  . Encounter for follow-up surveillance of vaginal cancer 03/18/2016  . Eczema 10/31/2015  . Left hand pain 09/10/2015  . Multinodular goiter 07/26/2015  . Osteopenia 07/26/2015  . Menopause 07/26/2015  . Irritable colon 07/26/2015  . History of cancer of vagina  07/26/2015  . Diffuse cystic mastopathy 07/26/2015  . Obesity (BMI 30.0-34.9) 07/26/2015  . Intermittent low back pain 07/26/2015  . Hyperlipemia 07/26/2015  . Osteoarthritis 05/13/2015  . Allergic rhinitis 05/13/2015  . GERD (gastroesophageal reflux disease) 05/13/2015  . Essential hypertension, benign     Past Surgical History:  Procedure Laterality Date  . BREAST EXCISIONAL BIOPSY Left    neg  . BREAST LUMPECTOMY Left   . COLONOSCOPY  08/26/2006  . COLONOSCOPY WITH PROPOFOL N/A 10/21/2016   Procedure: COLONOSCOPY WITH PROPOFOL;  Surgeon: Christene Lye, MD;  Location: [facility] ENDOSCOPY;  Service: Endoscopy;  Laterality: N/A;    Family History  Problem Relation Age of Onset  . Hypertension Father     Social History   Socioeconomic History  . Marital status: Widowed    Spouse name: Not on file  . Number of children: 0  . Years of education: Not on file  . Highest education level: Some college, no degree  Occupational History  . Occupation: Radio producer: [employer]  Social Needs  . Financial resource strain: Not hard at all  . Food insecurity:    Worry: Never true    Inability: Never true  . Transportation needs:    Medical: No    Non-medical: No  Tobacco Use  . Smoking status: Never Smoker  . Smokeless tobacco: Never Used  . Tobacco comment: experimented with dip snuff as a child  Substance and Sexual Activity  . Alcohol use: No  . Drug use: No  . Sexual activity: Not Currently    Birth control/protection: Post-menopausal  Lifestyle  . Physical activity:    Days per week: 5 days    Minutes per session: 60 min  . Stress: Only a little  Relationships  . Social connections:    Talks on phone: More than three times a week    Gets together: More than three times a week    Attends religious service: More than 4 times per year    Active member of club or organization: Yes    Attends meetings of clubs or organizations: More than 4 times per year     Relationship status: Widowed  . Intimate partner violence:    Fear of current or ex partner: No    Emotionally abused: No    Physically abused: No    Forced sexual activity: No  Other Topics Concern  . Not on file  Social History Narrative  . Not on file     Current Outpatient Medications:  .  aspirin 81 MG tablet, Take 81 mg by mouth daily. Reported on 10/10/2015, Disp: , Rfl:  .  cholecalciferol (VITAMIN D) 1000 units tablet, Take 1,000 Units by mouth daily., Disp: , Rfl:  .  desonide (DESOWEN) 0.05 % cream, Apply topically 2 (two) times daily., Disp: 60 g, Rfl: 0 .  diltiazem (CARDIZEM CD) 360 MG 24 hr capsule, Take 1 capsule (360 mg total) by mouth daily., Disp: 90 capsule, Rfl: 1 .  latanoprost (XALATAN) 0.005 % ophthalmic solution, PLACE 1 DROP IN BOTH EYES AT BEDTIME, Disp: , Rfl: 4 .  lisinopril (PRINIVIL,ZESTRIL) 20 MG tablet, TAKE 1 TABLET BY MOUTH EVERY DAY, Disp: 90 tablet, Rfl: 0 .  loratadine (CLARITIN) 10 MG tablet, Take 1 tablet (10 mg total) by mouth daily., Disp: 90 tablet, Rfl: 1 .  LORazepam (ATIVAN) 1 MG tablet, Take 1 tablet (1 mg total) by mouth 2 (two) times daily., Disp: 30 tablet, Rfl: 0 .  meclizine (ANTIVERT) 12.5 MG tablet, Take 1 tablet (12.5 mg total) by mouth 3 (three) times daily as needed for dizziness., Disp: 30 tablet, Rfl: 0 .  Multiple Vitamin (MULTIVITAMIN) tablet, Take 1 tablet by mouth daily. Reported on 10/10/2015, Disp: , Rfl:  .  Omega-3 Fatty Acids (FISH OIL CONCENTRATE PO), Take by mouth. Reported on 10/10/2015, Disp: , Rfl:  .  rosuvastatin (CRESTOR) 10 MG tablet, TAKE 1 TABLET BY MOUTH EVERY DAY, Disp: 90 tablet, Rfl: 0 .  tiZANidine (ZANAFLEX) 4 MG capsule, TAKE 1 CAPSULE (4 MG TOTAL) BY MOUTH AT BEDTIME., Disp: 30 capsule, Rfl: 0 .  traZODone (DESYREL) 50 MG tablet, Take 0.5-1 tablets (25-50 mg total) by mouth at bedtime as needed for sleep., Disp: 30 tablet, Rfl: 3 .  triamcinolone cream (KENALOG) 0.1 %, APPLY TO AFFECTED AREA EXTERNALLY 2  TO 3 TIMES A DAY FOR RASH, Disp: 90 g, Rfl: 0 .  valACYclovir (VALTREX) 1000 MG tablet, TAKE 1 TABLET BY MOUTH THREE TIMES A DAY, Disp: 30 tablet, Rfl: 0 .  vitamin C (ASCORBIC ACID) 500 MG tablet, Take 500 mg by mouth daily. Reported on 10/10/2015, Disp: , Rfl:  .  vitamin E 400 UNIT capsule, Take 400 Units by mouth daily. Reported on 10/10/2015, Disp: , Rfl:   Allergies  Allergen Reactions  . Atorvastatin     difficulty concentrating and focusing  . Terbinafine And Related Dermatitis    Rash, itch, skin discoloration     ROS  Constitutional: Negative for fever or weight change.  Respiratory: Negative for cough and shortness of breath.   Cardiovascular: Negative for chest pain or palpitations.  Gastrointestinal: Negative for abdominal pain, no bowel changes. She has been eating a lot almonds and states straining to have a bowel movement, seeing some blood when she wipes  Musculoskeletal: Negative for gait problem or joint swelling.  Skin: Negative for rash.  Neurological: positive for dizziness ( advised to go back to ENT ) but no  headache.   No other specific complaints in a complete review of systems (except as listed in HPI above).  Objective  Vitals:   07/29/18 0912  BP: 128/60  Pulse: 64  Resp: 16  Temp: 97.6 F (36.4 C)  TempSrc: Oral  SpO2: 96%  Weight: 169 lb 11.2 oz (77 kg)  Height: 5' 1.25" (1.556 m)    Body mass index is 31.8 kg/m.  Physical Exam  Constitutional: Patient appears well-developed and well-nourished. No distress.  HENT: Head: Normocephalic and atraumatic. Ears: B TMs ok, no erythema or effusion;  Nose: Nose normal. Mouth/Throat: Oropharynx is clear and moist. No oropharyngeal exudate.  Eyes: Conjunctivae and EOM are normal. Pupils are equal, round, and reactive to light. No scleral icterus.  Neck: Normal range of motion. Neck supple. No JVD present. No thyromegaly present.  Cardiovascular: Normal rate, regular rhythm and normal heart sounds.  No  murmur heard. No BLE edema. Pulmonary/Chest: Effort normal and breath sounds normal. No respiratory distress. Abdominal: Soft. Bowel sounds are normal, no distension. There is no tenderness. no masses Breast: no lumps or masses, no nipple discharge or rashes FEMALE GENITALIA:  External genitalia normal External urethra normal Atrophy on introitus  RECTAL: external hemorrhoids, no stools in the rectum  Musculoskeletal: Normal range of motion, no joint effusions. No gross deformities Neurological: he is alert and oriented to person, place, and time. No cranial nerve deficit. Coordination, balance, strength, speech and gait are normal.  Skin: Skin is warm and dry. No rash noted. No erythema.  Psychiatric: Patient has a normal mood and affect. behavior is normal. Judgment and thought content normal.  PHQ2/9: Depression screen [facility] 2/9 07/29/2018 07/14/2018 03/09/2018 01/07/2018 12/07/2017  Decreased Interest 0 0 0 0 1  Down, Depressed, Hopeless 0 0 0 0 1  PHQ - 2 Score 0 0 0 0 2  Altered sleeping 1 - 1 0 1  Tired, decreased energy 0 - 1 0 1  Change in appetite 1 - 1 0 1  Feeling bad or failure about yourself  0 - 0 0 0  Trouble concentrating 1 - 1 0 1  Moving slowly or fidgety/restless 0 - 0 0 0  Suicidal thoughts 0 - 0 0 0  PHQ-9 Score 3 - 4 0 6  Difficult doing work/chores Not difficult at all - - Not difficult at all Somewhat difficult     Fall Risk: Fall Risk  07/29/2018 07/14/2018 03/09/2018 01/07/2018 12/07/2017  Falls in the past year? 0 0 No No No  Number falls in past yr: 0 0 - - -  Injury with Fall? 0 0 - - -  Follow up - Falls prevention discussed - - -     Assessment & Plan  1. Well woman exam  She has dizziness, advised to follow up with ENT to see if [place] maneuver will help Blood when she wipes, she will change diet and see if persists, go to GI if needed. She prefers not getting a referral now, last colonoscopy was in 2018 and normal. Likely from external hemorrhoids     -USPSTF grade A and B recommendations reviewed with patient; age-appropriate recommendations, preventive care, screening tests, etc discussed and encouraged; healthy living encouraged; see AVS for patient education given to patient -Discussed importance of 150 minutes of physical activity weekly, eat two servings of fish weekly, eat one serving of tree nuts ( cashews, pistachios, pecans, almonds.Marland Kitchen) every other day, eat 6 servings of fruit/vegetables daily and drink plenty of water and avoid sweet beverages.

## 2018-07-29 NOTE — Patient Instructions (Signed)
Preventive Care 71 Years and Older, Female Preventive care refers to lifestyle choices and visits with your health care provider that can promote health and wellness. What does preventive care include?  A yearly physical exam. This is also called an annual well check.  Dental exams once or twice a year.  Routine eye exams. Ask your health care provider how often you should have your eyes checked.  Personal lifestyle choices, including: ? Daily care of your teeth and gums. ? Regular physical activity. ? Eating a healthy diet. ? Avoiding tobacco and drug use. ? Limiting alcohol use. ? Practicing safe sex. ? Taking low-dose aspirin every day. ? Taking vitamin and mineral supplements as recommended by your health care provider. What happens during an annual well check? The services and screenings done by your health care provider during your annual well check will depend on your age, overall health, lifestyle risk factors, and family history of disease. Counseling Your health care provider may ask you questions about your:  Alcohol use.  Tobacco use.  Drug use.  Emotional well-being.  Home and relationship well-being.  Sexual activity.  Eating habits.  History of falls.  Memory and ability to understand (cognition).  Work and work Statistician.  Reproductive health.  Screening You may have the following tests or measurements:  Height, weight, and BMI.  Blood pressure.  Lipid and cholesterol levels. These may be checked every 5 years, or more frequently if you are over 30 years old.  Skin check.  Lung cancer screening. You may have this screening every year starting at age 27 if you have a 30-pack-year history of smoking and currently smoke or have quit within the past 15 years.  Colorectal cancer screening. All adults should have this screening starting at age 33 and continuing until age 46. You will have tests every 1-10 years, depending on your results and the  type of screening test. People at increased risk should start screening at an earlier age. Screening tests may include: ? Guaiac-based fecal occult blood testing. ? Fecal immunochemical test (FIT). ? Stool DNA test. ? Virtual colonoscopy. ? Sigmoidoscopy. During this test, a flexible tube with a tiny camera (sigmoidoscope) is used to examine your rectum and lower colon. The sigmoidoscope is inserted through your anus into your rectum and lower colon. ? Colonoscopy. During this test, a long, thin, flexible tube with a tiny camera (colonoscope) is used to examine your entire colon and rectum.  Hepatitis C blood test.  Hepatitis B blood test.  Sexually transmitted disease (STD) testing.  Diabetes screening. This is done by checking your blood sugar (glucose) after you have not eaten for a while (fasting). You may have this done every 1-3 years.  Bone density scan. This is done to screen for osteoporosis. You may have this done starting at age 37.  Mammogram. This may be done every 1-2 years. Talk to your health care provider about how often you should have regular mammograms. Talk with your health care provider about your test results, treatment options, and if necessary, the need for more tests. Vaccines Your health care provider may recommend certain vaccines, such as:  Influenza vaccine. This is recommended every year.  Tetanus, diphtheria, and acellular pertussis (Tdap, Td) vaccine. You may need a Td booster every 10 years.  Varicella vaccine. You may need this if you have not been vaccinated.  Zoster vaccine. You may need this after age 38.  Measles, mumps, and rubella (MMR) vaccine. You may need at least  one dose of MMR if you were born in 1957 or later. You may also need a second dose.  Pneumococcal 13-valent conjugate (PCV13) vaccine. One dose is recommended after age 24.  Pneumococcal polysaccharide (PPSV23) vaccine. One dose is recommended after age 24.  Meningococcal  vaccine. You may need this if you have certain conditions.  Hepatitis A vaccine. You may need this if you have certain conditions or if you travel or work in places where you may be exposed to hepatitis A.  Hepatitis B vaccine. You may need this if you have certain conditions or if you travel or work in places where you may be exposed to hepatitis B.  Haemophilus influenzae type b (Hib) vaccine. You may need this if you have certain conditions. Talk to your health care provider about which screenings and vaccines you need and how often you need them. This information is not intended to replace advice given to you by your health care provider. Make sure you discuss any questions you have with your health care provider. Document Released: 06/14/2015 Document Revised: 07/08/2017 Document Reviewed: 03/19/2015 Elsevier Interactive Patient Education  2019 [employer].

## 2018-07-30 LAB — LIPID PANEL
CHOLESTEROL: 134 mg/dL (ref <200)
HDL: 60 mg/dL (ref >=50)
LDL Cholesterol (Calc): 61 mg/dL (calc)
Non-HDL Cholesterol (Calc): 74 mg/dL (calc) (ref <130)
Total CHOL/HDL Ratio: 2.2 (calc) (ref <5.0)
Triglycerides: 58 mg/dL (ref <150)

## 2018-07-30 LAB — HEMOGLOBIN A1C
Hgb A1c MFr Bld: 5.6 % of total Hgb (ref <5.7)
Mean Plasma Glucose: 114 (calc)
eAG (mmol/L): 6.3 (calc)

## 2018-07-30 LAB — COMPLETE METABOLIC PANEL WITH GFR
AG Ratio: 1.8 (calc) (ref 1.0–2.5)
ALT: 19 U/L (ref 6–29)
AST: 22 U/L (ref 10–35)
Albumin: 4.5 g/dL (ref 3.6–5.1)
Alkaline phosphatase (APISO): 48 U/L (ref 37–153)
BILIRUBIN TOTAL: 0.4 mg/dL (ref 0.2–1.2)
BUN: 17 mg/dL (ref 7–25)
CHLORIDE: 105 mmol/L (ref 98–110)
CO2: 28 mmol/L (ref 20–32)
Calcium: 9.9 mg/dL (ref 8.6–10.4)
Creat: 0.69 mg/dL (ref 0.60–0.93)
GFR, Est African American: 102 mL/min/{1.73_m2} (ref >=60)
GFR, Est Non African American: 88 mL/min/{1.73_m2} (ref >=60)
Globulin: 2.5 g/dL (calc) (ref 1.9–3.7)
Glucose, Bld: 85 mg/dL (ref 65–99)
Potassium: 4.3 mmol/L (ref 3.5–5.3)
Sodium: 140 mmol/L (ref 135–146)
Total Protein: 7 g/dL (ref 6.1–8.1)

## 2018-07-30 LAB — CBC WITH DIFFERENTIAL/PLATELET
Absolute Monocytes: 281 cells/uL (ref 200–950)
Basophils Absolute: 59 cells/uL (ref 0–200)
Basophils Relative: 1.8 %
Eosinophils Absolute: 99 cells/uL (ref 15–500)
Eosinophils Relative: 3 %
HEMATOCRIT: 44.1 % (ref 35.0–45.0)
HEMOGLOBIN: 14.3 g/dL (ref 11.7–15.5)
LYMPHS ABS: 1544 {cells}/uL (ref 850–3900)
MCH: 28.3 pg (ref 27.0–33.0)
MCHC: 32.4 g/dL (ref 32.0–36.0)
MCV: 87.2 fL (ref 80.0–100.0)
MPV: 11.4 fL (ref 7.5–12.5)
Monocytes Relative: 8.5 %
Neutro Abs: 1317 cells/uL — ABNORMAL LOW (ref 1500–7800)
Neutrophils Relative %: 39.9 %
Platelets: 271 10*3/uL (ref 140–400)
RBC: 5.06 10*6/uL (ref 3.80–5.10)
RDW: 12.4 % (ref 11.0–15.0)
Total Lymphocyte: 46.8 %
WBC: 3.3 10*3/uL — ABNORMAL LOW (ref 3.8–10.8)

## 2018-08-06 ENCOUNTER — Other Ambulatory Visit: Payer: Self-pay | Admitting: Family Medicine

## 2018-08-06 DIAGNOSIS — G4709 Other insomnia: Secondary | ICD-10-CM

## 2018-08-09 ENCOUNTER — Ambulatory Visit
Admission: RE | Admit: 2018-08-09 | Discharge: 2018-08-09 | Disposition: A | Discharge status: Home / Self Care | Payer: Medicare Other | Source: Ambulatory Visit | Attending: Family Medicine | Admitting: Family Medicine

## 2018-08-09 DIAGNOSIS — Z1231 Encounter for screening mammogram for malignant neoplasm of breast: Secondary | ICD-10-CM | POA: Insufficient documentation

## 2018-08-31 ENCOUNTER — Ambulatory Visit: Payer: Medicare Other | Admitting: Family Medicine

## 2018-09-03 ENCOUNTER — Other Ambulatory Visit: Payer: Self-pay | Admitting: Family Medicine

## 2018-10-03 ENCOUNTER — Other Ambulatory Visit: Payer: Self-pay | Admitting: Family Medicine

## 2018-10-03 DIAGNOSIS — E7800 Pure hypercholesterolemia, unspecified: Secondary | ICD-10-CM

## 2018-10-03 NOTE — Telephone Encounter (Signed)
Refill Request for Cholesterol medication. Crestor to [facility]  Last visit:  07/29/2018   Lab Results  Component Value Date   CHOL 134 07/29/2018   HDL 60 07/29/2018   LDLCALC 61 07/29/2018   TRIG 58 07/29/2018   CHOLHDL 2.2 07/29/2018    Follow up on 07/23/2019

## 2018-10-14 ENCOUNTER — Telehealth: Payer: Self-pay | Admitting: Family Medicine

## 2018-10-14 DIAGNOSIS — M62838 Other muscle spasm: Secondary | ICD-10-CM

## 2018-10-14 NOTE — Telephone Encounter (Unsigned)
Copied from Page [id number]. Topic: Quick Communication - Rx Refill/Question >> Oct 14, 2018 11:09 AM Celene Kras A wrote: Medication: Muscle relaxer  Has the patient contacted their pharmacy? No. Pt states she pulled a muscle while exercising. She states its getting better but she would like it to get better sooner. Please advise (Agent: If no, request that the patient contact the pharmacy for the refill.) (Agent: If yes, when and what did the pharmacy advise?)  Preferred Pharmacy (with phone number or street name): [id number] [place], [address] [address] Phone: [phone] Fax: [phone] Not a 24 hour pharmacy; exact hours not known.    Agent: Please be advised that RX refills may take up to 3 business days. We ask that you follow-up with your pharmacy.

## 2018-10-17 ENCOUNTER — Ambulatory Visit (INDEPENDENT_AMBULATORY_CARE_PROVIDER_SITE_OTHER): Payer: Medicare Other | Admitting: Family Medicine

## 2018-10-17 ENCOUNTER — Encounter: Payer: Self-pay | Admitting: Family Medicine

## 2018-10-17 DIAGNOSIS — M62838 Other muscle spasm: Secondary | ICD-10-CM | POA: Diagnosis not present

## 2018-10-17 DIAGNOSIS — N3000 Acute cystitis without hematuria: Secondary | ICD-10-CM | POA: Diagnosis not present

## 2018-10-17 MED ORDER — TIZANIDINE HCL 4 MG PO CAPS: 4.0000 mg | ORAL_CAPSULE | Freq: Every day | ORAL | 1 refills | Status: DC

## 2018-10-17 MED ORDER — SULFAMETHOXAZOLE-TRIMETHOPRIM 800-160 MG PO TABS: 1.0000 | ORAL_TABLET | Freq: Two times a day (BID) | ORAL | 0 refills | Status: AC

## 2018-10-17 NOTE — Telephone Encounter (Signed)
Pt has an appt.

## 2018-10-17 NOTE — Progress Notes (Signed)
Name: Vickie Stewart   MRN: [id number]    DOB: 1945/06/03   Date:10/17/2018       Progress Note  Subjective  Chief Complaint  Chief Complaint  Patient presents with  . Back Pain    middle lower radiates to left side and lower abdomen  . Urinary Tract Infection    frequency  . Medication Refill    I connected with  [place] on 10/17/18 at  9:40 AM EDT by telephone and verified that I am speaking with the correct person using two identifiers.   I discussed the limitations, risks, security and privacy concerns of performing an evaluation and management service by telephone and the availability of in person appointments. Staff also discussed with the patient that there may be a patient responsible charge related to this service. Patient Location: Home Provider Location: Home  Additional Individuals present: None  HPI  Pt presents with concern for possible UTI.  She has been taking ibuprofen and cranberry juice and this seems to help a little bit.  She endorses urinary frequency, some left sided lower abdominal discomfort, foul smelling urine.  Denies dysuria, blood in urine, or history of kidney stones, no vomiting, no fevers or chills.  Did have nausea initially, but this has since gone away.   She also notes needs refill of tizanidine for low back pain - she states she can't go to the gym so she has been doing a lot more exercise at home.  Her kidney function has been quite stable.  Patient Active Problem List   Diagnosis Date Noted  . Meningioma ([place]) 10/04/2017  . Lichen sclerosus 46/27/0350  . Vaginal atrophy 03/18/2016  . Encounter for follow-up surveillance of vaginal cancer 03/18/2016  . Eczema 10/31/2015  . Left hand pain 09/10/2015  . Multinodular goiter 07/26/2015  . Osteopenia 07/26/2015  . Menopause 07/26/2015  . Irritable colon 07/26/2015  . History of cancer of vagina 07/26/2015  . Diffuse cystic mastopathy 07/26/2015  . Obesity (BMI  30.0-34.9) 07/26/2015  . Intermittent low back pain 07/26/2015  . Hyperlipemia 07/26/2015  . Osteoarthritis 05/13/2015  . Allergic rhinitis 05/13/2015  . GERD (gastroesophageal reflux disease) 05/13/2015  . Essential hypertension, benign     Social History   Tobacco Use  . Smoking status: Never Smoker  . Smokeless tobacco: Never Used  . Tobacco comment: experimented with dip snuff as a child  Substance Use Topics  . Alcohol use: No     Current Outpatient Medications:  .  aspirin 81 MG tablet, Take 81 mg by mouth daily. Reported on 10/10/2015, Disp: , Rfl:  .  cholecalciferol (VITAMIN D) 1000 units tablet, Take 1,000 Units by mouth daily., Disp: , Rfl:  .  desonide (DESOWEN) 0.05 % cream, Apply topically 2 (two) times daily., Disp: 60 g, Rfl: 0 .  diltiazem (CARDIZEM CD) 360 MG 24 hr capsule, Take 1 capsule (360 mg total) by mouth daily., Disp: 90 capsule, Rfl: 1 .  latanoprost (XALATAN) 0.005 % ophthalmic solution, PLACE 1 DROP IN BOTH EYES AT BEDTIME, Disp: , Rfl: 4 .  lisinopril (PRINIVIL,ZESTRIL) 20 MG tablet, TAKE 1 TABLET BY MOUTH EVERY DAY, Disp: 90 tablet, Rfl: 0 .  loratadine (CLARITIN) 10 MG tablet, Take 1 tablet (10 mg total) by mouth daily., Disp: 90 tablet, Rfl: 1 .  Multiple Vitamin (MULTIVITAMIN) tablet, Take 1 tablet by mouth daily. Reported on 10/10/2015, Disp: , Rfl:  .  Omega-3 Fatty Acids (FISH OIL CONCENTRATE PO), Take by mouth.  Reported on 10/10/2015, Disp: , Rfl:  .  rosuvastatin (CRESTOR) 10 MG tablet, TAKE 1 TABLET BY MOUTH EVERY DAY, Disp: 90 tablet, Rfl: 3 .  traZODone (DESYREL) 50 MG tablet, TAKE 1/2 TO 1 TABLETS (25-50 MG TOTAL) BY MOUTH AT BEDTIME AS NEEDED FOR SLEEP., Disp: 90 tablet, Rfl: 0 .  triamcinolone cream (KENALOG) 0.1 %, APPLY TO AFFECTED AREA EXTERNALLY 2 TO 3 TIMES A DAY FOR RASH, Disp: 80 g, Rfl: 0 .  vitamin C (ASCORBIC ACID) 500 MG tablet, Take 500 mg by mouth daily. Reported on 10/10/2015, Disp: , Rfl:  .  vitamin E 400 UNIT capsule, Take  400 Units by mouth daily. Reported on 10/10/2015, Disp: , Rfl:  .  meclizine (ANTIVERT) 12.5 MG tablet, Take 1 tablet (12.5 mg total) by mouth 3 (three) times daily as needed for dizziness. (Patient not taking: Reported on 10/17/2018), Disp: 30 tablet, Rfl: 0 .  tiZANidine (ZANAFLEX) 4 MG capsule, TAKE 1 CAPSULE (4 MG TOTAL) BY MOUTH AT BEDTIME. (Patient not taking: Reported on 10/17/2018), Disp: 30 capsule, Rfl: 0 .  valACYclovir (VALTREX) 1000 MG tablet, TAKE 1 TABLET BY MOUTH THREE TIMES A DAY (Patient not taking: Reported on 10/17/2018), Disp: 30 tablet, Rfl: 0  Allergies  Allergen Reactions  . Atorvastatin     difficulty concentrating and focusing  . Terbinafine And Related Dermatitis    Rash, itch, skin discoloration    I personally reviewed active problem list, medication list, allergies, notes from last encounter, lab results with the patient/caregiver today.  ROS  Ten systems reviewed and is negative except as mentioned in HPI  Objective  Virtual encounter, vitals not obtained.  There is no height or weight on file to calculate BMI.  Nursing Note and Vital Signs reviewed.  Physical Exam  Pulmonary/Chest: Effort normal. No respiratory distress. Speaking in complete sentences Neurological: Pt is alert and oriented to person, place, and time. Speech is normal Psychiatric: Patient has a normal mood and affect. behavior is normal. Judgment and thought content normal. Unable to visualize patient as she does not have a smartphone or access to video technology   No results found for this or any previous visit (from the past 72 hour(s)).  Assessment & Plan  1. Muscle spasms of neck - tiZANidine (ZANAFLEX) 4 MG capsule; Take 1 capsule (4 mg total) by mouth at bedtime.  Dispense: 30 capsule; Refill: 1  2. Acute cystitis without hematuria - Advised to call back in 4-5 days if not improving and we can consider extending the bactrim for another 2-3 days as she notes she has had to  be on longer antibiotic courses in the past for UTI's. - sulfamethoxazole-trimethoprim (BACTRIM DS) 800-160 MG tablet; Take 1 tablet by mouth 2 (two) times daily for 5 days.  Dispense: 10 tablet; Refill: 0  -Red flags and when to present for emergency care or RTC including fever >101.59F, chest pain, shortness of breath, new/worsening/un-resolving symptoms, reviewed with patient at time of visit. Follow up and care instructions discussed and provided in AVS. - I discussed the assessment and treatment plan with the patient. The patient was provided an opportunity to ask questions and all were answered. The patient agreed with the plan and demonstrated an understanding of the instructions.  - The patient was advised to call back or seek an in-person evaluation if the symptoms worsen or if the condition fails to improve as anticipated.  I provided 15 minutes of non-face-to-face time during this encounter.  Hubbard Hartshorn,  FNP 

## 2018-11-08 ENCOUNTER — Other Ambulatory Visit: Payer: Self-pay | Admitting: Family Medicine

## 2018-11-08 DIAGNOSIS — M62838 Other muscle spasm: Secondary | ICD-10-CM

## 2018-11-28 ENCOUNTER — Other Ambulatory Visit: Payer: Self-pay | Admitting: Family Medicine

## 2018-11-28 DIAGNOSIS — I10 Essential (primary) hypertension: Secondary | ICD-10-CM

## 2018-11-28 NOTE — Telephone Encounter (Signed)
Hypertension medication request: Diltiazem   Last office visit pertaining to hypertension: 07/29/2018   BP Readings from Last 3 Encounters:  07/29/18 128/60  07/14/18 (!) 144/80  03/09/18 136/82    Lab Results  Component Value Date   CREATININE 0.69 07/29/2018   BUN 17 07/29/2018   NA 140 07/29/2018   K 4.3 07/29/2018   CL 105 07/29/2018   CO2 28 07/29/2018     Follow up 07/30/2019

## 2018-12-04 ENCOUNTER — Other Ambulatory Visit: Payer: Self-pay | Admitting: Family Medicine

## 2018-12-04 DIAGNOSIS — I10 Essential (primary) hypertension: Secondary | ICD-10-CM

## 2018-12-06 NOTE — Telephone Encounter (Signed)
appt scheduled

## 2018-12-14 ENCOUNTER — Ambulatory Visit: Payer: Medicare Other | Admitting: Family Medicine

## 2018-12-14 ENCOUNTER — Encounter: Payer: Self-pay | Admitting: Family Medicine

## 2018-12-14 ENCOUNTER — Other Ambulatory Visit: Payer: Self-pay

## 2018-12-14 VITALS — BP 166/80 | HR 75 | Temp 96.6°F | Resp 16 | Ht 61.0 in | Wt 166.2 lb

## 2018-12-14 DIAGNOSIS — I10 Essential (primary) hypertension: Secondary | ICD-10-CM | POA: Diagnosis not present

## 2018-12-14 DIAGNOSIS — M62838 Other muscle spasm: Secondary | ICD-10-CM | POA: Diagnosis not present

## 2018-12-14 DIAGNOSIS — M545 Low back pain, unspecified: Secondary | ICD-10-CM

## 2018-12-14 DIAGNOSIS — F341 Dysthymic disorder: Secondary | ICD-10-CM

## 2018-12-14 DIAGNOSIS — G44209 Tension-type headache, unspecified, not intractable: Secondary | ICD-10-CM

## 2018-12-14 LAB — POCT URINALYSIS DIPSTICK
Appearance: ABNORMAL
Bilirubin, UA: NEGATIVE
Blood, UA: NEGATIVE
Glucose, UA: NEGATIVE
Ketones, UA: NEGATIVE
Leukocytes, UA: NEGATIVE
Nitrite, UA: NEGATIVE
Odor: ABNORMAL
Protein, UA: NEGATIVE
Spec Grav, UA: 1.025 (ref 1.010–1.025)
Urobilinogen, UA: 0.2 E.U./dL
pH, UA: 6 (ref 5.0–8.0)

## 2018-12-14 MED ORDER — BUTALBITAL-APAP-CAFFEINE 50-325-40 MG PO TABS: 1.0000 | ORAL_TABLET | Freq: Two times a day (BID) | ORAL | 0 refills | Status: DC | PRN

## 2018-12-14 MED ORDER — AMITRIPTYLINE HCL 25 MG PO TABS: 25.0000 mg | ORAL_TABLET | Freq: Every day | ORAL | 0 refills | Status: DC

## 2018-12-14 NOTE — Progress Notes (Signed)
Name: Vickie Stewart   MRN: [id number]    DOB: [birth year]   Date:12/14/2018       Progress Note  Subjective  Chief Complaint  Chief Complaint  Patient presents with  . Neck Pain    she has been having neck and back pain that comes and goes x 3 weeks. Radiates down into her back . She has pressure in her head more pressure when she is sitting down.  . Back Pain  . Gait Problem    HPI  Neck pain: she states symptoms started a few weeks, she states neck feels tight but no decrease in rom. She has been trying to do neck exercises. She states that when she sits a certain way it improves the pain.   Headache: she states she has a frontal pressure and last night she felt a little off balance when she got up to go to the bathroom, but able to mow her yard without problems. She has a meningioma and seen by ENT and also neurosurgeon - Dr Lacinda Axon . She has follow up with him in Nov for repeat MRI   Lower back pain: she states started about one month ago. She states her low back pain felt like a tigthness, and initially was radiating to left lower quadrant ,she was seen by Raelyn Ensign, NP. She was given antibiotics and zanaflex, pain on LLQ resolved, but continues to have intermittent lower back pain. No rashes.   Stress: she has not been working since last year, she had a vertigo and decided to stay home to take care of her father that is 61 yo. She is bored , she wants to go back to work.   HTN: bp is usually well controlled, but bp today is elevated, she seems anxious and worried about her health.    Patient Active Problem List   Diagnosis Date Noted  . Meningioma ([place]) 10/04/2017  . Lichen sclerosus 67/59/1638  . Vaginal atrophy 03/18/2016  . Encounter for follow-up surveillance of vaginal cancer 03/18/2016  . Eczema 10/31/2015  . Left hand pain 09/10/2015  . Multinodular goiter 07/26/2015  . Osteopenia 07/26/2015  . Menopause 07/26/2015  . Irritable colon 07/26/2015  . History of  cancer of vagina 07/26/2015  . Diffuse cystic mastopathy 07/26/2015  . Obesity (BMI 30.0-34.9) 07/26/2015  . Intermittent low back pain 07/26/2015  . Hyperlipemia 07/26/2015  . Osteoarthritis 05/13/2015  . Allergic rhinitis 05/13/2015  . GERD (gastroesophageal reflux disease) 05/13/2015  . Essential hypertension, benign     Past Surgical History:  Procedure Laterality Date  . BREAST EXCISIONAL BIOPSY Left    neg  . BREAST LUMPECTOMY Left   . COLONOSCOPY  08/26/2006  . COLONOSCOPY WITH PROPOFOL N/A 10/21/2016   Procedure: COLONOSCOPY WITH PROPOFOL;  Surgeon: Christene Lye, MD;  Location: [facility] ENDOSCOPY;  Service: Endoscopy;  Laterality: N/A;    Family History  Problem Relation Age of Onset  . Hypertension Father   . Breast cancer Neg Hx     Social History   Socioeconomic History  . Marital status: Widowed    Spouse name: Not on file  . Number of children: 0  . Years of education: Not on file  . Highest education level: Some college, no degree  Occupational History  . Occupation: Radio producer: [employer]  Social Needs  . Financial resource strain: Not hard at all  . Food insecurity    Worry: Never true    Inability: Never true  .  Transportation needs    Medical: No    Non-medical: No  Tobacco Use  . Smoking status: Never Smoker  . Smokeless tobacco: Never Used  . Tobacco comment: experimented with dip snuff as a child  Substance and Sexual Activity  . Alcohol use: No  . Drug use: No  . Sexual activity: Not Currently    Birth control/protection: Post-menopausal  Lifestyle  . Physical activity    Days per week: 5 days    Minutes per session: 60 min  . Stress: Only a little  Relationships  . Social connections    Talks on phone: More than three times a week    Gets together: More than three times a week    Attends religious service: More than 4 times per year    Active member of club or organization: Yes    Attends meetings of clubs or  organizations: More than 4 times per year    Relationship status: Widowed  . Intimate partner violence    Fear of current or ex partner: No    Emotionally abused: No    Physically abused: No    Forced sexual activity: No  Other Topics Concern  . Not on file  Social History Narrative  . Not on file     Current Outpatient Medications:  .  aspirin 81 MG tablet, Take 81 mg by mouth daily. Reported on 10/10/2015, Disp: , Rfl:  .  cholecalciferol (VITAMIN D) 1000 units tablet, Take 1,000 Units by mouth daily., Disp: , Rfl:  .  desonide (DESOWEN) 0.05 % cream, Apply topically 2 (two) times daily., Disp: 60 g, Rfl: 0 .  diltiazem (CARDIZEM CD) 360 MG 24 hr capsule, TAKE 1 CAPSULE (360 MG TOTAL) BY MOUTH DAILY., Disp: 90 capsule, Rfl: 1 .  latanoprost (XALATAN) 0.005 % ophthalmic solution, PLACE 1 DROP IN BOTH EYES AT BEDTIME, Disp: , Rfl: 4 .  lisinopril (ZESTRIL) 20 MG tablet, TAKE 1 TABLET BY MOUTH EVERY DAY, Disp: 90 tablet, Rfl: 0 .  Multiple Vitamin (MULTIVITAMIN) tablet, Take 1 tablet by mouth daily. Reported on 10/10/2015, Disp: , Rfl:  .  Omega-3 Fatty Acids (FISH OIL CONCENTRATE PO), Take by mouth. Reported on 10/10/2015, Disp: , Rfl:  .  rosuvastatin (CRESTOR) 10 MG tablet, TAKE 1 TABLET BY MOUTH EVERY DAY, Disp: 90 tablet, Rfl: 3 .  tiZANidine (ZANAFLEX) 4 MG capsule, Take 1 capsule (4 mg total) by mouth at bedtime., Disp: 30 capsule, Rfl: 1 .  triamcinolone cream (KENALOG) 0.1 %, APPLY TO AFFECTED AREA EXTERNALLY 2 TO 3 TIMES A DAY FOR RASH, Disp: 80 g, Rfl: 0 .  vitamin C (ASCORBIC ACID) 500 MG tablet, Take 500 mg by mouth daily. Reported on 10/10/2015, Disp: , Rfl:  .  vitamin E 400 UNIT capsule, Take 400 Units by mouth daily. Reported on 10/10/2015, Disp: , Rfl:  .  loratadine (CLARITIN) 10 MG tablet, Take 1 tablet (10 mg total) by mouth daily. (Patient not taking: Reported on 12/14/2018), Disp: 90 tablet, Rfl: 1 .  traZODone (DESYREL) 50 MG tablet, TAKE 1/2 TO 1 TABLETS (25-50 MG  TOTAL) BY MOUTH AT BEDTIME AS NEEDED FOR SLEEP. (Patient not taking: Reported on 12/14/2018), Disp: 90 tablet, Rfl: 0  Allergies  Allergen Reactions  . Atorvastatin     difficulty concentrating and focusing  . Terbinafine And Related Dermatitis    Rash, itch, skin discoloration    I personally reviewed active problem list, medication list, allergies, family history, social history with the patient/caregiver  today.   ROS  Ten systems reviewed and is negative except as mentioned in HPI   Objective  Vitals:   12/14/18 1431 12/14/18 1444  BP: (!) 150/80 (!) 150/92  Pulse: 75   Resp: 16   Temp: (!) 96.6 F (35.9 C)   TempSrc: Temporal   SpO2: 97%   Weight: 166 lb 3.2 oz (75.4 kg)   Height: 5\' 1"  (1.549 m)     Body mass index is 31.4 kg/m.  Physical Exam  Constitutional: Patient appears well-developed and well-nourished. Obese No distress.  HEENT: head atraumatic, normocephalic, pupils equal and reactive to light,  neck supple Cardiovascular: Normal rate, regular rhythm and normal heart sounds.  No murmur heard. No BLE edema. Pulmonary/Chest: Effort normal and breath sounds normal. No respiratory distress. Abdominal: Soft.  There is no tenderness. Psychiatric: Patient has a normal mood and affect. behavior is normal. Judgment and thought content normal. Neurological : no focal findings.   Recent Results (from the past 2160 hour(s))  POCT Urinalysis Dipstick     Status: Normal   Collection Time: 12/14/18  2:39 PM  Result Value Ref Range   Color, UA dark yellow    Clarity, UA clear    Glucose, UA Negative Negative   Bilirubin, UA Negative    Ketones, UA Negative    Spec Grav, UA 1.025 1.010 - 1.025   Blood, UA Negative    pH, UA 6.0 5.0 - 8.0   Protein, UA Negative Negative   Urobilinogen, UA 0.2 0.2 or 1.0 E.U./dL   Nitrite, UA Negative    Leukocytes, UA Negative Negative   Appearance Abnormal    Odor Abnormal      PHQ2/9: Depression screen [facility] 2/9 12/14/2018  10/17/2018 07/29/2018 07/14/2018 03/09/2018  Decreased Interest 1 0 0 0 0  Down, Depressed, Hopeless 1 0 0 0 0  PHQ - 2 Score 2 0 0 0 0  Altered sleeping 0 0 1 - 1  Tired, decreased energy 1 0 0 - 1  Change in appetite 0 0 1 - 1  Feeling bad or failure about yourself  0 0 0 - 0  Trouble concentrating 1 0 1 - 1  Moving slowly or fidgety/restless 0 0 0 - 0  Suicidal thoughts - 0 0 - 0  PHQ-9 Score 4 0 3 - 4  Difficult doing work/chores Not difficult at all Not difficult at all Not difficult at all - -  Some recent data might be hidden    phq 9 is positive   Fall Risk: Fall Risk  12/14/2018 12/14/2018 10/17/2018 07/29/2018 07/14/2018  Falls in the past year? 0 0 0 0 0  Number falls in past yr: 0 0 0 0 0  Injury with Fall? 0 0 0 0 0  Follow up - - - - Falls prevention discussed    Functional Status Survey: Is the patient deaf or have difficulty hearing?: No Does the patient have difficulty seeing, even when wearing glasses/contacts?: No Does the patient have difficulty concentrating, remembering, or making decisions?: No Does the patient have difficulty walking or climbing stairs?: No Does the patient have difficulty dressing or bathing?: No Does the patient have difficulty doing errands alone such as visiting a doctor's office or shopping?: No    Assessment & Plan   1. Acute midline low back pain without sciatica  - POCT Urinalysis Dipstick  2. Neck muscle spasm   3. Tension headache  - amitriptyline (ELAVIL) 25 MG tablet; Take 1 tablet (  25 mg total) by mouth at bedtime. For headaches  Dispense: 30 tablet; Refill: 0 - butalbital-acetaminophen-caffeine (FIORICET) 50-325-40 MG tablet; Take 1 tablet by mouth 2 (two) times daily as needed for headache.  Dispense: 30 tablet; Refill: 0  4. Essential hypertension, benign  bp is high, she is very anxious   5. Dysthymia  She will try massage therapy and take time for herself, phq 9 is high, feeling isolated taking care of father  and wants to go back to work

## 2018-12-27 ENCOUNTER — Ambulatory Visit: Payer: Medicare Other | Admitting: Family Medicine

## 2018-12-27 ENCOUNTER — Encounter: Payer: Self-pay | Admitting: Family Medicine

## 2018-12-27 ENCOUNTER — Other Ambulatory Visit: Payer: Self-pay

## 2018-12-27 VITALS — BP 160/84 | HR 70 | Temp 97.1°F | Resp 16 | Ht 61.0 in | Wt 165.6 lb

## 2018-12-27 DIAGNOSIS — I10 Essential (primary) hypertension: Secondary | ICD-10-CM | POA: Diagnosis not present

## 2018-12-27 DIAGNOSIS — F341 Dysthymic disorder: Secondary | ICD-10-CM | POA: Diagnosis not present

## 2018-12-27 DIAGNOSIS — M62838 Other muscle spasm: Secondary | ICD-10-CM | POA: Diagnosis not present

## 2018-12-27 DIAGNOSIS — G44209 Tension-type headache, unspecified, not intractable: Secondary | ICD-10-CM

## 2018-12-27 DIAGNOSIS — D329 Benign neoplasm of meninges, unspecified: Secondary | ICD-10-CM | POA: Diagnosis not present

## 2018-12-27 MED ORDER — HYDROCHLOROTHIAZIDE 12.5 MG PO TABS: 12.5000 mg | ORAL_TABLET | Freq: Every day | ORAL | 0 refills | Status: DC

## 2018-12-27 MED ORDER — AMITRIPTYLINE HCL 25 MG PO TABS: 25.0000 mg | ORAL_TABLET | Freq: Every day | ORAL | 0 refills | Status: DC

## 2018-12-27 NOTE — Progress Notes (Signed)
Name: Vickie Stewart   MRN: [id number]    DOB: 1946-02-06   Date:12/27/2018       Progress Note  Subjective  Chief Complaint  Chief Complaint  Patient presents with  . Hypertension  . Neck Pain    Continues to have neck pain. She describes the pain as sore and discomfort. Pain comes and goes onset x 1 month.    HPI  Neck pain: she states symptoms started a few weeks, she states neck feels tight but no decrease in rom. She has been trying to do neck exercises. She was seen by chiropractor - Dr. Freddi Che and not sure if she will go back secondary to cost, very expensive therapy  HTN: bp is very high again, she states at home bp has been better when she checks after resting. Advised her to start HCTZ, also to bring bp machine to calibrate with ours on her next visit She states she gets nervous when she comes in but her bp used to be at goal in the past   Headache: she states taking Elavil and not having as many episodes, taking Fioricet prn, seems to be working for her, advised to continue medication   Meningioma: still under the care of Dr. Lacinda Axon, follow up Nov, denies vision problems, no vomiting, nausea, or focal deficit   Patient Active Problem List   Diagnosis Date Noted  . Meningioma ([place]) 10/04/2017  . Lichen sclerosus 95/18/8416  . Vaginal atrophy 03/18/2016  . Encounter for follow-up surveillance of vaginal cancer 03/18/2016  . Eczema 10/31/2015  . Left hand pain 09/10/2015  . Multinodular goiter 07/26/2015  . Osteopenia 07/26/2015  . Menopause 07/26/2015  . Irritable colon 07/26/2015  . History of cancer of vagina 07/26/2015  . Diffuse cystic mastopathy 07/26/2015  . Obesity (BMI 30.0-34.9) 07/26/2015  . Intermittent low back pain 07/26/2015  . Hyperlipemia 07/26/2015  . Osteoarthritis 05/13/2015  . Allergic rhinitis 05/13/2015  . GERD (gastroesophageal reflux disease) 05/13/2015  . Essential hypertension, benign     Past Surgical History:  Procedure  Laterality Date  . BREAST EXCISIONAL BIOPSY Left    neg  . BREAST LUMPECTOMY Left   . COLONOSCOPY  08/26/2006  . COLONOSCOPY WITH PROPOFOL N/A 10/21/2016   Procedure: COLONOSCOPY WITH PROPOFOL;  Surgeon: Christene Lye, MD;  Location: [facility] ENDOSCOPY;  Service: Endoscopy;  Laterality: N/A;    Family History  Problem Relation Age of Onset  . Hypertension Father   . Breast cancer Neg Hx     Social History   Socioeconomic History  . Marital status: Widowed    Spouse name: Not on file  . Number of children: 0  . Years of education: Not on file  . Highest education level: Some college, no degree  Occupational History  . Occupation: Radio producer: [employer]  Social Needs  . Financial resource strain: Not hard at all  . Food insecurity    Worry: Never true    Inability: Never true  . Transportation needs    Medical: No    Non-medical: No  Tobacco Use  . Smoking status: Never Smoker  . Smokeless tobacco: Never Used  . Tobacco comment: experimented with dip snuff as a child  Substance and Sexual Activity  . Alcohol use: No  . Drug use: No  . Sexual activity: Not Currently    Birth control/protection: Post-menopausal  Lifestyle  . Physical activity    Days per week: 5 days    Minutes per  session: 60 min  . Stress: Only a little  Relationships  . Social connections    Talks on phone: More than three times a week    Gets together: More than three times a week    Attends religious service: More than 4 times per year    Active member of club or organization: Yes    Attends meetings of clubs or organizations: More than 4 times per year    Relationship status: Widowed  . Intimate partner violence    Fear of current or ex partner: No    Emotionally abused: No    Physically abused: No    Forced sexual activity: No  Other Topics Concern  . Not on file  Social History Narrative  . Not on file     Current Outpatient Medications:  .  amitriptyline (ELAVIL) 25  MG tablet, Take 1 tablet (25 mg total) by mouth at bedtime. For headaches, Disp: 30 tablet, Rfl: 0 .  aspirin 81 MG tablet, Take 81 mg by mouth daily. Reported on 10/10/2015, Disp: , Rfl:  .  butalbital-acetaminophen-caffeine (FIORICET) 50-325-40 MG tablet, Take 1 tablet by mouth 2 (two) times daily as needed for headache., Disp: 30 tablet, Rfl: 0 .  cholecalciferol (VITAMIN D) 1000 units tablet, Take 1,000 Units by mouth daily., Disp: , Rfl:  .  desonide (DESOWEN) 0.05 % cream, Apply topically 2 (two) times daily., Disp: 60 g, Rfl: 0 .  diltiazem (CARDIZEM CD) 360 MG 24 hr capsule, TAKE 1 CAPSULE (360 MG TOTAL) BY MOUTH DAILY., Disp: 90 capsule, Rfl: 1 .  latanoprost (XALATAN) 0.005 % ophthalmic solution, PLACE 1 DROP IN BOTH EYES AT BEDTIME, Disp: , Rfl: 4 .  lisinopril (ZESTRIL) 20 MG tablet, TAKE 1 TABLET BY MOUTH EVERY DAY, Disp: 90 tablet, Rfl: 0 .  Multiple Vitamin (MULTIVITAMIN) tablet, Take 1 tablet by mouth daily. Reported on 10/10/2015, Disp: , Rfl:  .  Omega-3 Fatty Acids (FISH OIL CONCENTRATE PO), Take by mouth. Reported on 10/10/2015, Disp: , Rfl:  .  rosuvastatin (CRESTOR) 10 MG tablet, TAKE 1 TABLET BY MOUTH EVERY DAY, Disp: 90 tablet, Rfl: 3 .  triamcinolone cream (KENALOG) 0.1 %, APPLY TO AFFECTED AREA EXTERNALLY 2 TO 3 TIMES A DAY FOR RASH, Disp: 80 g, Rfl: 0 .  vitamin C (ASCORBIC ACID) 500 MG tablet, Take 500 mg by mouth daily. Reported on 10/10/2015, Disp: , Rfl:  .  vitamin E 400 UNIT capsule, Take 400 Units by mouth daily. Reported on 10/10/2015, Disp: , Rfl:   Allergies  Allergen Reactions  . Atorvastatin     difficulty concentrating and focusing  . Terbinafine And Related Dermatitis    Rash, itch, skin discoloration    I personally reviewed active problem list, medication list, allergies, family history, social history with the patient/caregiver today.   ROS  Constitutional: Negative for fever or weight change.  Respiratory: Negative for cough and shortness of  breath.   Cardiovascular: Negative for chest pain or palpitations.  Gastrointestinal: Negative for abdominal pain, no bowel changes.  Musculoskeletal: Negative for gait problem or joint swelling.  Skin: Negative for rash.  Neurological: Negative for dizziness , positive for  headache.  No other specific complaints in a complete review of systems (except as listed in HPI above).  Objective  Vitals:   12/27/18 1132 12/27/18 1156 12/27/18 1157  BP: (!) 150/70 (!) 160/90 (!) 160/84  Pulse: 70    Resp: 16    Temp: (!) 97.1 F (36.2 C)  TempSrc: Temporal    SpO2: 98%    Weight: 165 lb 9.6 oz (75.1 kg)    Height: 5\' 1"  (1.549 m)      Body mass index is 31.29 kg/m.  Physical Exam  Constitutional: Patient appears well-developed and well-nourished. Obese  No distress.  HEENT: head atraumatic, normocephalic, pupils equal and reactive to light, neck supple, throat within normal limits Cardiovascular: Normal rate, regular rhythm and normal heart sounds.  No murmur heard. No BLE edema. Pulmonary/Chest: Effort normal and breath sounds normal. No respiratory distress. Abdominal: Soft.  There is no tenderness. Psychiatric: Patient has a normal mood and affect. behavior is normal. Judgment and thought content normal.  Recent Results (from the past 2160 hour(s))  POCT Urinalysis Dipstick     Status: Normal   Collection Time: 12/14/18  2:39 PM  Result Value Ref Range   Color, UA dark yellow    Clarity, UA clear    Glucose, UA Negative Negative   Bilirubin, UA Negative    Ketones, UA Negative    Spec Grav, UA 1.025 1.010 - 1.025   Blood, UA Negative    pH, UA 6.0 5.0 - 8.0   Protein, UA Negative Negative   Urobilinogen, UA 0.2 0.2 or 1.0 E.U./dL   Nitrite, UA Negative    Leukocytes, UA Negative Negative   Appearance Abnormal    Odor Abnormal      PHQ2/9: Depression screen [facility] 2/9 12/14/2018 10/17/2018 07/29/2018 07/14/2018 03/09/2018  Decreased Interest 1 0 0 0 0  Down,  Depressed, Hopeless 1 0 0 0 0  PHQ - 2 Score 2 0 0 0 0  Altered sleeping 0 0 1 - 1  Tired, decreased energy 1 0 0 - 1  Change in appetite 0 0 1 - 1  Feeling bad or failure about yourself  0 0 0 - 0  Trouble concentrating 1 0 1 - 1  Moving slowly or fidgety/restless 0 0 0 - 0  Suicidal thoughts - 0 0 - 0  PHQ-9 Score 4 0 3 - 4  Difficult doing work/chores Not difficult at all Not difficult at all Not difficult at all - -  Some recent data might be hidden    phq 9 is positive, she states not working, carrying for her father and COVID-19   Fall Risk: Fall Risk  12/27/2018 12/14/2018 12/14/2018 10/17/2018 07/29/2018  Falls in the past year? 0 0 0 0 0  Number falls in past yr: 0 0 0 0 0  Injury with Fall? 0 0 0 0 0  Follow up Falls evaluation completed - - - -    Assessment & Plan  1. Meningioma [facility])  She has follow up with Dr. Lacinda Axon in Nov  2. Dysthymia  She refuses medication, still stressed  3. Essential hypertension, benign  We will add hctz - hydrochlorothiazide (HYDRODIURIL) 12.5 MG tablet; Take 1 tablet (12.5 mg total) by mouth daily.  Dispense: 30 tablet; Refill: 0  4. Muscle spasms of neck  Worse since yesterday because she was cutting hedges   5. Tension headache  - amitriptyline (ELAVIL) 25 MG tablet; Take 1 tablet (25 mg total) by mouth at bedtime. For headaches  Dispense: 90 tablet; Refill: 0

## 2018-12-27 NOTE — Patient Instructions (Signed)

## 2019-01-03 DIAGNOSIS — G8929 Other chronic pain: Secondary | ICD-10-CM | POA: Insufficient documentation

## 2019-01-03 DIAGNOSIS — M542 Cervicalgia: Secondary | ICD-10-CM | POA: Insufficient documentation

## 2019-01-13 ENCOUNTER — Telehealth: Payer: Self-pay | Admitting: Family Medicine

## 2019-01-13 NOTE — Telephone Encounter (Signed)
Called patient in regards to BP. Explained to her what PCP had informed her to do. NO answer LVM.

## 2019-01-13 NOTE — Telephone Encounter (Signed)
Pt would like a call back about her bp readings. Pt checked it today around 5 this morning and it was 128, second reading was about 30 mins ago and it was 166.

## 2019-01-18 ENCOUNTER — Other Ambulatory Visit: Payer: Self-pay | Admitting: Family Medicine

## 2019-01-18 DIAGNOSIS — I10 Essential (primary) hypertension: Secondary | ICD-10-CM

## 2019-01-18 NOTE — Telephone Encounter (Signed)
Requested Prescriptions  Pending Prescriptions Disp Refills  . hydrochlorothiazide (HYDRODIURIL) 12.5 MG tablet [Pharmacy Med Name: HYDROCHLOROTHIAZIDE 12.5 MG TB] 30 tablet 0    Sig: TAKE 1 TABLET BY MOUTH EVERY DAY     Cardiovascular: Diuretics - Thiazide Failed - 01/18/2019 11:02 AM      Failed - Last BP in normal range    BP Readings from Last 1 Encounters:  12/27/18 (!) 160/84         Passed - Ca in normal range and within 360 days    Calcium  Date Value Ref Range Status  07/29/2018 9.9 8.6 - 10.4 mg/dL Final         Passed - Cr in normal range and within 360 days    Creat  Date Value Ref Range Status  07/29/2018 0.69 0.60 - 0.93 mg/dL Final    Comment:    For patients >16 years of age, the reference limit for Creatinine is approximately 13% higher for people identified as African-American. .          Passed - K in normal range and within 360 days    Potassium  Date Value Ref Range Status  07/29/2018 4.3 3.5 - 5.3 mmol/L Final         Passed - Na in normal range and within 360 days    Sodium  Date Value Ref Range Status  07/29/2018 140 135 - 146 mmol/L Final  05/27/2016 142 134 - 144 mmol/L Final         Passed - Valid encounter within last 6 months    Recent Outpatient Visits          3 weeks ago Meningioma [facility])   [facility] Steele Sizer, MD   1 month ago Essential hypertension, benign   [facility] [place], Drue Stager, MD   3 months ago Acute cystitis without hematuria   [place], FNP   5 months ago Well woman exam   [facility] Steele Sizer, MD   10 months ago [facility] Steele Sizer, MD      Future Appointments            In 2 weeks Steele Sizer, MD [facility], [place]   In 6 months  [facility], [facility]

## 2019-01-20 ENCOUNTER — Other Ambulatory Visit: Payer: Self-pay | Admitting: Neurosurgery

## 2019-01-20 DIAGNOSIS — D329 Benign neoplasm of meninges, unspecified: Secondary | ICD-10-CM

## 2019-01-23 ENCOUNTER — Ambulatory Visit: Payer: Medicare Other | Admitting: Family Medicine

## 2019-02-01 ENCOUNTER — Encounter: Payer: Self-pay | Admitting: Family Medicine

## 2019-02-01 ENCOUNTER — Ambulatory Visit: Payer: Medicare Other | Admitting: Family Medicine

## 2019-02-01 ENCOUNTER — Other Ambulatory Visit: Payer: Self-pay

## 2019-02-01 VITALS — BP 142/74 | HR 61 | Temp 96.9°F | Resp 16 | Ht 61.0 in | Wt 166.8 lb

## 2019-02-01 DIAGNOSIS — I10 Essential (primary) hypertension: Secondary | ICD-10-CM

## 2019-02-01 DIAGNOSIS — G44209 Tension-type headache, unspecified, not intractable: Secondary | ICD-10-CM

## 2019-02-01 DIAGNOSIS — Z23 Encounter for immunization: Secondary | ICD-10-CM

## 2019-02-01 DIAGNOSIS — M62838 Other muscle spasm: Secondary | ICD-10-CM | POA: Diagnosis not present

## 2019-02-01 MED ORDER — LISINOPRIL-HYDROCHLOROTHIAZIDE 20-25 MG PO TABS: 1.0000 | ORAL_TABLET | Freq: Every day | ORAL | 0 refills | Status: DC

## 2019-02-01 MED ORDER — HYDRALAZINE HCL 10 MG PO TABS: 10.0000 mg | ORAL_TABLET | Freq: Three times a day (TID) | ORAL | 0 refills | Status: DC

## 2019-02-01 NOTE — Progress Notes (Signed)
Name: Vickie Stewart   MRN: [id number]    DOB: August 17, 1945   Date:02/01/2019       Progress Note  Subjective  Chief Complaint  Chief Complaint  Patient presents with  . Hypertension    Thinks that the headaches make her bp go up.  Marland Kitchen Headache    Continues to have headaches on and off.    HPI  HTN: she is taking all medications at night, bp at home average 160/82, but it goes to 120/80's,  she still has some intermittent headaches but it has improved. She brought her monitor today and it was off by 10 points SBP with her monitor. She states since getting dry needling and PT at [facility] she states neck pain has improved and headaches and dizziness also improved, she will only get MRI c-spine if pain does not resolve. We will try going up a little on HCTZ from 12.5 to 25 mg and monitor  Patient Active Problem List   Diagnosis Date Noted  . Neck pain 01/03/2019  . Meningioma ([place]) 10/04/2017  . Lichen sclerosus [id number]  . Vaginal atrophy 03/18/2016  . Encounter for follow-up surveillance of vaginal cancer 03/18/2016  . Eczema 10/31/2015  . Left hand pain 09/10/2015  . Multinodular goiter 07/26/2015  . Osteopenia 07/26/2015  . Menopause 07/26/2015  . Irritable colon 07/26/2015  . History of cancer of vagina 07/26/2015  . Diffuse cystic mastopathy 07/26/2015  . Obesity (BMI 30.0-34.9) 07/26/2015  . Intermittent low back pain 07/26/2015  . Hyperlipemia 07/26/2015  . Osteoarthritis 05/13/2015  . Allergic rhinitis 05/13/2015  . GERD (gastroesophageal reflux disease) 05/13/2015  . Essential hypertension, benign     Past Surgical History:  Procedure Laterality Date  . BREAST EXCISIONAL BIOPSY Left    neg  . BREAST LUMPECTOMY Left   . COLONOSCOPY  08/26/2006  . COLONOSCOPY WITH PROPOFOL N/A 10/21/2016   Procedure: COLONOSCOPY WITH PROPOFOL;  Surgeon: Christene Lye, MD;  Location: [facility] ENDOSCOPY;  Service: Endoscopy;  Laterality: N/A;    Family History   Problem Relation Age of Onset  . Hypertension Father   . Breast cancer Neg Hx     Social History   Socioeconomic History  . Marital status: Widowed    Spouse name: Not on file  . Number of children: 0  . Years of education: Not on file  . Highest education level: Some college, no degree  Occupational History  . Occupation: Radio producer: [employer]  Social Needs  . Financial resource strain: Not hard at all  . Food insecurity    Worry: Never true    Inability: Never true  . Transportation needs    Medical: No    Non-medical: No  Tobacco Use  . Smoking status: Never Smoker  . Smokeless tobacco: Never Used  . Tobacco comment: experimented with dip snuff as a child  Substance and Sexual Activity  . Alcohol use: No  . Drug use: No  . Sexual activity: Not Currently    Birth control/protection: Post-menopausal  Lifestyle  . Physical activity    Days per week: 5 days    Minutes per session: 60 min  . Stress: Only a little  Relationships  . Social connections    Talks on phone: More than three times a week    Gets together: More than three times a week    Attends religious service: More than 4 times per year    Active member of club or organization:  Yes    Attends meetings of clubs or organizations: More than 4 times per year    Relationship status: Widowed  . Intimate partner violence    Fear of current or ex partner: No    Emotionally abused: No    Physically abused: No    Forced sexual activity: No  Other Topics Concern  . Not on file  Social History Narrative  . Not on file     Current Outpatient Medications:  .  aspirin 81 MG tablet, Take 81 mg by mouth daily. Reported on 10/10/2015, Disp: , Rfl:  .  cholecalciferol (VITAMIN D) 1000 units tablet, Take 1,000 Units by mouth daily., Disp: , Rfl:  .  desonide (DESOWEN) 0.05 % cream, Apply topically 2 (two) times daily., Disp: 60 g, Rfl: 0 .  diltiazem (CARDIZEM CD) 360 MG 24 hr capsule, TAKE 1 CAPSULE (360  MG TOTAL) BY MOUTH DAILY., Disp: 90 capsule, Rfl: 1 .  latanoprost (XALATAN) 0.005 % ophthalmic solution, PLACE 1 DROP IN BOTH EYES AT BEDTIME, Disp: , Rfl: 4 .  lisinopril-hydrochlorothiazide (ZESTORETIC) 20-25 MG tablet, Take 1 tablet by mouth daily., Disp: 90 tablet, Rfl: 0 .  Multiple Vitamin (MULTIVITAMIN) tablet, Take 1 tablet by mouth daily. Reported on 10/10/2015, Disp: , Rfl:  .  Omega-3 Fatty Acids (FISH OIL CONCENTRATE PO), Take by mouth. Reported on 10/10/2015, Disp: , Rfl:  .  rosuvastatin (CRESTOR) 10 MG tablet, TAKE 1 TABLET BY MOUTH EVERY DAY, Disp: 90 tablet, Rfl: 3 .  triamcinolone cream (KENALOG) 0.1 %, APPLY TO AFFECTED AREA EXTERNALLY 2 TO 3 TIMES A DAY FOR RASH (Patient not taking: Reported on 02/01/2019), Disp: 80 g, Rfl: 0 .  vitamin C (ASCORBIC ACID) 500 MG tablet, Take 500 mg by mouth daily. Reported on 10/10/2015, Disp: , Rfl:  .  vitamin E 400 UNIT capsule, Take 400 Units by mouth daily. Reported on 10/10/2015, Disp: , Rfl:   Allergies  Allergen Reactions  . Atorvastatin     difficulty concentrating and focusing  . Terbinafine And Related Dermatitis    Rash, itch, skin discoloration    I personally reviewed active problem list, medication list, allergies, family history, social history with the patient/caregiver today.   ROS  Constitutional: Negative for fever or weight change.  Respiratory: Negative for cough and shortness of breath.   Cardiovascular: Negative for chest pain or palpitations.  Gastrointestinal: Negative for abdominal pain, no bowel changes.  Musculoskeletal: Negative for gait problem or joint swelling.  Skin: Negative for rash.  Neurological: Negative for dizziness , positive for intermittent headache.  No other specific complaints in a complete review of systems (except as listed in HPI above).  Objective  Vitals:   02/01/19 1409  BP: (!) 142/74  Pulse: 61  Resp: 16  Temp: (!) 96.9 F (36.1 C)  TempSrc: Temporal  SpO2: 98%  Weight:  166 lb 12.8 oz (75.7 kg)  Height: 5\' 1"  (1.549 m)    Body mass index is 31.52 kg/m.  Physical Exam  Constitutional: Patient appears well-developed and well-nourished. Obese No distress.  HEENT: head atraumatic, normocephalic, pupils equal and reactive to light, neck supple, tender during palpation of left side of neck  Cardiovascular: Normal rate, regular rhythm and normal heart sounds.  No murmur heard. No BLE edema. Pulmonary/Chest: Effort normal and breath sounds normal. No respiratory distress. Abdominal: Soft.  There is no tenderness. Psychiatric: Patient has a normal mood and affect. behavior is normal. Judgment and thought content normal.  Recent Results (  from the past 2160 hour(s))  POCT Urinalysis Dipstick     Status: Normal   Collection Time: 12/14/18  2:39 PM  Result Value Ref Range   Color, UA dark yellow    Clarity, UA clear    Glucose, UA Negative Negative   Bilirubin, UA Negative    Ketones, UA Negative    Spec Grav, UA 1.025 1.010 - 1.025   Blood, UA Negative    pH, UA 6.0 5.0 - 8.0   Protein, UA Negative Negative   Urobilinogen, UA 0.2 0.2 or 1.0 E.U./dL   Nitrite, UA Negative    Leukocytes, UA Negative Negative   Appearance Abnormal    Odor Abnormal      PHQ2/9: Depression screen [facility] 2/9 02/01/2019 12/14/2018 10/17/2018 07/29/2018 07/14/2018  Decreased Interest 0 1 0 0 0  Down, Depressed, Hopeless 0 1 0 0 0  PHQ - 2 Score 0 2 0 0 0  Altered sleeping 0 0 0 1 -  Tired, decreased energy 0 1 0 0 -  Change in appetite 0 0 0 1 -  Feeling bad or failure about yourself  0 0 0 0 -  Trouble concentrating 0 1 0 1 -  Moving slowly or fidgety/restless 0 0 0 0 -  Suicidal thoughts 0 - 0 0 -  PHQ-9 Score 0 4 0 3 -  Difficult doing work/chores - Not difficult at all Not difficult at all Not difficult at all -  Some recent data might be hidden    phq 9 is negative   Fall Risk: Fall Risk  02/01/2019 12/27/2018 12/14/2018 12/14/2018 10/17/2018  Falls in the past year? 0 0  0 0 0  Number falls in past yr: 0 0 0 0 0  Injury with Fall? 0 0 0 0 0  Follow up - Falls evaluation completed - - -    Functional Status Survey: Is the patient deaf or have difficulty hearing?: No Does the patient have difficulty seeing, even when wearing glasses/contacts?: No Does the patient have difficulty concentrating, remembering, or making decisions?: No Does the patient have difficulty walking or climbing stairs?: No Does the patient have difficulty dressing or bathing?: No    Assessment & Plan  1. Essential hypertension, benign  - lisinopril-hydrochlorothiazide (ZESTORETIC) 20-25 MG tablet; Take 1 tablet by mouth daily.  Dispense: 90 tablet; Refill: 0  Take tylenol prn pain since nsaid's raises bp  2. Neck muscle spasm  Seeing Ortho, getting PT and dry needling   3. Need for immunization against influenza  - Flu Vaccine QUAD High Dose(Fluad)  4. Tension headache  Stopped elavil and fioricet because it did not work for her, but feeling much better

## 2019-02-11 ENCOUNTER — Other Ambulatory Visit: Payer: Self-pay | Admitting: Family Medicine

## 2019-02-11 DIAGNOSIS — I10 Essential (primary) hypertension: Secondary | ICD-10-CM

## 2019-02-11 NOTE — Telephone Encounter (Signed)
Requested medication (s) are due for refill today: yes  Requested medication (s) are on the active medication list: yes  Last refill:  02/01/2019  Future visit scheduled: yes  Notes to clinic:  The original prescription was discontinued on 02/01/2019 by Steele Sizer, MD for the following reason: Change in therapy. Renewing this prescription may not be appropriate   Requested Prescriptions  Pending Prescriptions Disp Refills   hydrochlorothiazide (HYDRODIURIL) 12.5 MG tablet [Pharmacy Med Name: HYDROCHLOROTHIAZIDE 12.5 MG TB] 30 tablet 0    Sig: TAKE 1 TABLET BY MOUTH EVERY DAY     Cardiovascular: Diuretics - Thiazide Failed - 02/11/2019 10:33 AM      Failed - Last BP in normal range    BP Readings from Last 1 Encounters:  02/01/19 (!) 142/74         Passed - Ca in normal range and within 360 days    Calcium  Date Value Ref Range Status  07/29/2018 9.9 8.6 - 10.4 mg/dL Final         Passed - Cr in normal range and within 360 days    Creat  Date Value Ref Range Status  07/29/2018 0.69 0.60 - 0.93 mg/dL Final    Comment:    For patients >17 years of age, the reference limit for Creatinine is approximately 13% higher for people identified as African-American. .          Passed - K in normal range and within 360 days    Potassium  Date Value Ref Range Status  07/29/2018 4.3 3.5 - 5.3 mmol/L Final         Passed - Na in normal range and within 360 days    Sodium  Date Value Ref Range Status  07/29/2018 140 135 - 146 mmol/L Final  05/27/2016 142 134 - 144 mmol/L Final         Passed - Valid encounter within last 6 months    Recent Outpatient Visits          1 week ago Essential hypertension, benign   [facility] Steele Sizer, MD   1 month ago Meningioma [facility])   [facility] Steele Sizer, MD   1 month ago Essential hypertension, benign   [facility] [place], Drue Stager, MD   3 months ago Acute cystitis  without hematuria   [place], [place], FNP   6 months ago Well woman exam   [facility] Steele Sizer, MD      Future Appointments            In 2 months Ancil Boozer, Drue Stager, MD [facility], [place]   In 5 months  [facility], [facility]

## 2019-03-03 ENCOUNTER — Other Ambulatory Visit: Payer: Self-pay | Admitting: Family Medicine

## 2019-03-03 DIAGNOSIS — I10 Essential (primary) hypertension: Secondary | ICD-10-CM

## 2019-03-03 NOTE — Telephone Encounter (Signed)
Requested medication (s) are due for refill today: no  Requested medication (s) are on the active medication list: no  Last refill:  12/05/2018  Future visit scheduled: yes  Notes to clinic:  Medication was discontinued    Requested Prescriptions  Pending Prescriptions Disp Refills   lisinopril (ZESTRIL) 20 MG tablet [Pharmacy Med Name: LISINOPRIL 20 MG TABLET] 90 tablet 0    Sig: TAKE 1 TABLET BY MOUTH EVERY DAY     Cardiovascular:  ACE Inhibitors Failed - 03/03/2019  1:28 AM      Failed - Cr in normal range and within 180 days    Creat  Date Value Ref Range Status  07/29/2018 0.69 0.60 - 0.93 mg/dL Final    Comment:    For patients >46 years of age, the reference limit for Creatinine is approximately 13% higher for people identified as African-American. .          Failed - K in normal range and within 180 days    Potassium  Date Value Ref Range Status  07/29/2018 4.3 3.5 - 5.3 mmol/L Final         Failed - Last BP in normal range    BP Readings from Last 1 Encounters:  02/01/19 (!) 142/74         Passed - Patient is not pregnant      Passed - Valid encounter within last 6 months    Recent Outpatient Visits          1 month ago Essential hypertension, benign   [facility] Steele Sizer, MD   2 months ago Meningioma [facility])   [facility] Steele Sizer, MD   2 months ago Essential hypertension, benign   [facility], Drue Stager, MD   4 months ago Acute cystitis without hematuria   [place], [place], FNP   7 months ago Well woman exam   [facility] Steele Sizer, MD      Future Appointments            In 2 months Ancil Boozer, Drue Stager, MD [facility], [place]   In 4 months  [facility], [facility]

## 2019-04-20 ENCOUNTER — Ambulatory Visit
Admission: RE | Admit: 2019-04-20 | Discharge: 2019-04-20 | Disposition: A | Discharge status: Home / Self Care | Payer: Medicare Other | Source: Ambulatory Visit | Attending: Neurosurgery | Admitting: Neurosurgery

## 2019-04-20 ENCOUNTER — Other Ambulatory Visit: Payer: Self-pay

## 2019-04-20 DIAGNOSIS — D329 Benign neoplasm of meninges, unspecified: Secondary | ICD-10-CM | POA: Diagnosis present

## 2019-04-20 LAB — POCT I-STAT CREATININE: Creatinine, Ser: 0.7 mg/dL (ref 0.44–1.00)

## 2019-04-20 MED ORDER — GADOBUTROL 1 MMOL/ML IV SOLN
7.0000 mL | Freq: Once | INTRAVENOUS | Status: AC | PRN
  Administered 2019-04-20: 7 mL via INTRAVENOUS

## 2019-05-03 ENCOUNTER — Ambulatory Visit: Payer: Medicare Other | Admitting: Family Medicine

## 2019-05-18 ENCOUNTER — Other Ambulatory Visit: Payer: Self-pay | Admitting: Family Medicine

## 2019-05-18 DIAGNOSIS — I10 Essential (primary) hypertension: Secondary | ICD-10-CM

## 2019-05-30 ENCOUNTER — Other Ambulatory Visit (HOSPITAL_COMMUNITY)
Admission: RE | Admit: 2019-05-30 | Discharge: 2019-05-30 | Disposition: A | Discharge status: Home / Self Care | Payer: Medicare Other | Source: Ambulatory Visit | Attending: Family Medicine | Admitting: Family Medicine

## 2019-05-30 ENCOUNTER — Ambulatory Visit: Payer: Medicare Other | Admitting: Family Medicine

## 2019-05-30 ENCOUNTER — Other Ambulatory Visit: Payer: Self-pay

## 2019-05-30 ENCOUNTER — Encounter: Payer: Self-pay | Admitting: Family Medicine

## 2019-05-30 VITALS — BP 138/72 | HR 65 | Temp 97.8°F | Resp 16 | Ht 61.0 in | Wt 167.0 lb

## 2019-05-30 DIAGNOSIS — R922 Inconclusive mammogram: Secondary | ICD-10-CM

## 2019-05-30 DIAGNOSIS — N898 Other specified noninflammatory disorders of vagina: Secondary | ICD-10-CM

## 2019-05-30 LAB — POCT URINALYSIS DIPSTICK
Bilirubin, UA: NEGATIVE
Blood, UA: NEGATIVE
Glucose, UA: NEGATIVE
Ketones, UA: NEGATIVE
Nitrite, UA: NEGATIVE
Protein, UA: POSITIVE — AB
Spec Grav, UA: 1.02 (ref 1.010–1.025)
Urobilinogen, UA: 0.2 E.U./dL
pH, UA: 5 (ref 5.0–8.0)

## 2019-05-30 NOTE — Addendum Note (Signed)
Addended by: Hubbard Hartshorn on: 05/30/2019 11:03 AM   Modules accepted: Orders

## 2019-05-30 NOTE — Progress Notes (Addendum)
Name: Vickie Stewart   MRN: [id number]    DOB: [birth year]   Date:05/30/2019       Progress Note  Subjective  Chief Complaint  Chief Complaint  Patient presents with  . vaginal odor    HPI  Pt presents with concern for vaginal odor that started about 2-3 days ago.  She denies vaginal discharge, dysuria, urinary frequency/urgency, abdominal pain, fevers/chills, NVD, flank/back pain has not bee sexually active in about 10 years.  She does note history of BV in the past.    LEFT breast lump: noticed in the left upper outer quadrant this month when performing BSE.  She had mammgoram march 2020 and was negative. Denies nipple discharge, rashes, dimpling.  Patient Active Problem List   Diagnosis Date Noted  . Neck pain 01/03/2019  . Meningioma ([place]) 10/04/2017  . Lichen sclerosus [id number]  . Vaginal atrophy 03/18/2016  . Encounter for follow-up surveillance of vaginal cancer 03/18/2016  . Eczema 10/31/2015  . Left hand pain 09/10/2015  . Multinodular goiter 07/26/2015  . Osteopenia 07/26/2015  . Menopause 07/26/2015  . Irritable colon 07/26/2015  . History of cancer of vagina 07/26/2015  . Diffuse cystic mastopathy 07/26/2015  . Obesity (BMI 30.0-34.9) 07/26/2015  . Intermittent low back pain 07/26/2015  . Hyperlipemia 07/26/2015  . Osteoarthritis 05/13/2015  . Allergic rhinitis 05/13/2015  . GERD (gastroesophageal reflux disease) 05/13/2015  . Essential hypertension, benign     Social History   Tobacco Use  . Smoking status: Never Smoker  . Smokeless tobacco: Never Used  . Tobacco comment: experimented with dip snuff as a child  Substance Use Topics  . Alcohol use: No     Current Outpatient Medications:  .  aspirin 81 MG tablet, Take 81 mg by mouth daily. Reported on 10/10/2015, Disp: , Rfl:  .  cholecalciferol (VITAMIN D) 1000 units tablet, Take 1,000 Units by mouth daily., Disp: , Rfl:  .  desonide (DESOWEN) 0.05 % cream, Apply topically 2 (two) times  daily., Disp: 60 g, Rfl: 0 .  diltiazem (CARDIZEM CD) 360 MG 24 hr capsule, TAKE 1 CAPSULE (360 MG TOTAL) BY MOUTH DAILY., Disp: 90 capsule, Rfl: 1 .  hydrALAZINE (APRESOLINE) 10 MG tablet, Take 1 tablet (10 mg total) by mouth 3 (three) times daily. If bp remains above 160/90, Disp: 30 tablet, Rfl: 0 .  latanoprost (XALATAN) 0.005 % ophthalmic solution, PLACE 1 DROP IN BOTH EYES AT BEDTIME, Disp: , Rfl: 4 .  lisinopril-hydrochlorothiazide (ZESTORETIC) 20-25 MG tablet, Take 1 tablet by mouth daily., Disp: 90 tablet, Rfl: 0 .  Multiple Vitamin (MULTIVITAMIN) tablet, Take 1 tablet by mouth daily. Reported on 10/10/2015, Disp: , Rfl:  .  Omega-3 Fatty Acids (FISH OIL CONCENTRATE PO), Take by mouth. Reported on 10/10/2015, Disp: , Rfl:  .  rosuvastatin (CRESTOR) 10 MG tablet, TAKE 1 TABLET BY MOUTH EVERY DAY, Disp: 90 tablet, Rfl: 3 .  vitamin C (ASCORBIC ACID) 500 MG tablet, Take 500 mg by mouth daily. Reported on 10/10/2015, Disp: , Rfl:  .  vitamin E 400 UNIT capsule, Take 400 Units by mouth daily. Reported on 10/10/2015, Disp: , Rfl:  .  triamcinolone cream (KENALOG) 0.1 %, APPLY TO AFFECTED AREA EXTERNALLY 2 TO 3 TIMES A DAY FOR RASH (Patient not taking: Reported on 02/01/2019), Disp: 80 g, Rfl: 0  Allergies  Allergen Reactions  . Atorvastatin     difficulty concentrating and focusing  . Terbinafine And Related Dermatitis    Rash, itch, skin discoloration  I personally reviewed active problem list, medication list, allergies, notes from last encounter, lab results with the patient/caregiver today.  ROS  Constitutional: Negative for fever or weight change.  Respiratory: Negative for cough and shortness of breath.   Cardiovascular: Negative for chest pain or palpitations.  Gastrointestinal: Negative for abdominal pain, no bowel changes.  Musculoskeletal: Negative for gait problem or joint swelling.  Skin: Negative for rash.  Neurological: Negative for dizziness or headache.  No other  specific complaints in a complete review of systems (except as listed in HPI above).  Objective  Vitals:   05/30/19 1018  BP: 138/72  Pulse: 65  Resp: 16  Temp: 97.8 F (36.6 C)  TempSrc: Temporal  SpO2: 95%  Weight: 167 lb (75.8 kg)  Height: 5\' 1"  (1.549 m)   Body mass index is 31.55 kg/m.  Nursing Note and Vital Signs reviewed.  Physical Exam  Constitutional: Patient appears well-developed and well-nourished. Obese. No distress.  HEENT: head atraumatic, normocephalic Cardiovascular: Normal rate, regular rhythm and normal heart sounds.  No murmur heard. No BLE edema. Pulmonary/Chest: Effort normal and breath sounds clear bilaterally. No respiratory distress. Abdominal: Soft, bowel sounds normal, there is no tenderness, no HSM.  No CVA tenderness. Psychiatric: Patient has a normal mood and affect. behavior is normal. Judgment and thought content normal. Breast: There is some poorly defined shotty tissue to the LEFT breast at 3:00 position. No nipple discharge or rashes.   Results for orders placed or performed in visit on 05/30/19 (from the past 72 hour(s))  POCT urinalysis dipstick     Status: Abnormal   Collection Time: 05/30/19 10:25 AM  Result Value Ref Range   Color, UA yellow    Clarity, UA clear    Glucose, UA Negative Negative   Bilirubin, UA negative    Ketones, UA negative    Spec Grav, UA 1.020 1.010 - 1.025   Blood, UA negative    pH, UA 5.0 5.0 - 8.0   Protein, UA Positive (A) Negative   Urobilinogen, UA 0.2 0.2 or 1.0 E.U./dL   Nitrite, UA negative    Leukocytes, UA Trace (A) Negative   Appearance clear    Odor none     Assessment & Plan  1. Vaginal odor - POCT urinalysis dipstick - Urine Culture - Cervicovaginal ancillary only  2. Breast density - MM Digital Diagnostic Bilat; Future   -Red flags and when to present for emergency care or RTC including fever >101.30F, chest pain, shortness of breath, new/worsening/un-resolving symptoms,  reviewed with patient at time of visit. Follow up and care instructions discussed and provided in AVS.

## 2019-05-31 ENCOUNTER — Other Ambulatory Visit: Payer: Self-pay | Admitting: Family Medicine

## 2019-05-31 DIAGNOSIS — N6320 Unspecified lump in the left breast, unspecified quadrant: Secondary | ICD-10-CM

## 2019-05-31 LAB — CERVICOVAGINAL ANCILLARY ONLY
Bacterial Vaginitis (gardnerella): NEGATIVE
Candida Glabrata: NEGATIVE
Candida Vaginitis: NEGATIVE
Comment: NEGATIVE
Comment: NEGATIVE
Comment: NEGATIVE
Comment: NEGATIVE
Trichomonas: NEGATIVE

## 2019-06-01 LAB — URINE CULTURE
MICRO NUMBER:: 1238201
Result:: NO GROWTH
SPECIMEN QUALITY:: ADEQUATE

## 2019-06-06 ENCOUNTER — Other Ambulatory Visit: Payer: Self-pay

## 2019-06-06 ENCOUNTER — Telehealth: Payer: Self-pay | Admitting: Family Medicine

## 2019-06-06 ENCOUNTER — Other Ambulatory Visit: Payer: Self-pay | Admitting: Family Medicine

## 2019-06-06 ENCOUNTER — Encounter: Payer: Self-pay | Admitting: Family Medicine

## 2019-06-06 ENCOUNTER — Ambulatory Visit: Payer: Medicare PPO | Admitting: Family Medicine

## 2019-06-06 VITALS — BP 140/70 | HR 71 | Temp 96.8°F | Resp 16 | Ht 61.0 in | Wt 165.8 lb

## 2019-06-06 DIAGNOSIS — N898 Other specified noninflammatory disorders of vagina: Secondary | ICD-10-CM

## 2019-06-06 DIAGNOSIS — Z1231 Encounter for screening mammogram for malignant neoplasm of breast: Secondary | ICD-10-CM | POA: Diagnosis not present

## 2019-06-06 DIAGNOSIS — N952 Postmenopausal atrophic vaginitis: Secondary | ICD-10-CM

## 2019-06-06 DIAGNOSIS — I10 Essential (primary) hypertension: Secondary | ICD-10-CM

## 2019-06-06 MED ORDER — ESTRADIOL 0.1 MG/GM VA CREA: 1.0000 | TOPICAL_CREAM | VAGINAL | 12 refills | Status: DC

## 2019-06-06 MED ORDER — METRONIDAZOLE 500 MG PO TABS: 500.0000 mg | ORAL_TABLET | Freq: Two times a day (BID) | ORAL | 0 refills | Status: DC

## 2019-06-06 NOTE — Progress Notes (Signed)
Name: Vickie Stewart   MRN: [id number]    DOB: [birth year]   Date:06/06/2019       Progress Note  Subjective  Chief Complaint  Chief Complaint  Patient presents with  . Vaginitis    Still having odor-had Urine check and swab done w Raquel Sarna. Everything came back Normal.    HPI  Left breast lump: she states she felt a fullness on left breast about two weeks ago, no nipple discharge, no change of skin. No lymphadenopathy. A diagnostic mammogram was placed, however she no longer feels the lump   Vaginal odor: she was seen by Raelyn Ensign last week, had vaginal swab and urine culture and it was negative. She denies any pain, but states continues to have a vaginal odor, she denies being sexually active for years, no vaginal bleeding.   Patient Active Problem List   Diagnosis Date Noted  . Neck pain 01/03/2019  . Meningioma ([place]) 10/04/2017  . Lichen sclerosus Q000111Q  . Vaginal atrophy 03/18/2016  . Encounter for follow-up surveillance of vaginal cancer 03/18/2016  . Eczema 10/31/2015  . Left hand pain 09/10/2015  . Multinodular goiter 07/26/2015  . Osteopenia 07/26/2015  . Menopause 07/26/2015  . Irritable colon 07/26/2015  . History of cancer of vagina 07/26/2015  . Diffuse cystic mastopathy 07/26/2015  . Obesity (BMI 30.0-34.9) 07/26/2015  . Intermittent low back pain 07/26/2015  . Hyperlipemia 07/26/2015  . Osteoarthritis 05/13/2015  . Allergic rhinitis 05/13/2015  . GERD (gastroesophageal reflux disease) 05/13/2015  . Essential hypertension, benign     Social History   Tobacco Use  . Smoking status: Never Smoker  . Smokeless tobacco: Never Used  . Tobacco comment: experimented with dip snuff as a child  Substance Use Topics  . Alcohol use: No     Current Outpatient Medications:  .  aspirin 81 MG tablet, Take 81 mg by mouth daily. Reported on 10/10/2015, Disp: , Rfl:  .  cholecalciferol (VITAMIN D) 1000 units tablet, Take 1,000 Units by mouth daily., Disp: ,  Rfl:  .  desonide (DESOWEN) 0.05 % cream, Apply topically 2 (two) times daily., Disp: 60 g, Rfl: 0 .  diltiazem (CARDIZEM CD) 360 MG 24 hr capsule, TAKE 1 CAPSULE (360 MG TOTAL) BY MOUTH DAILY., Disp: 90 capsule, Rfl: 1 .  hydrALAZINE (APRESOLINE) 10 MG tablet, Take 1 tablet (10 mg total) by mouth 3 (three) times daily. If bp remains above 160/90, Disp: 30 tablet, Rfl: 0 .  latanoprost (XALATAN) 0.005 % ophthalmic solution, PLACE 1 DROP IN BOTH EYES AT BEDTIME, Disp: , Rfl: 4 .  lisinopril-hydrochlorothiazide (ZESTORETIC) 20-25 MG tablet, TAKE 1 TABLET BY MOUTH EVERY DAY, Disp: 90 tablet, Rfl: 0 .  Multiple Vitamin (MULTIVITAMIN) tablet, Take 1 tablet by mouth daily. Reported on 10/10/2015, Disp: , Rfl:  .  Omega-3 Fatty Acids (FISH OIL CONCENTRATE PO), Take by mouth. Reported on 10/10/2015, Disp: , Rfl:  .  rosuvastatin (CRESTOR) 10 MG tablet, TAKE 1 TABLET BY MOUTH EVERY DAY, Disp: 90 tablet, Rfl: 3 .  triamcinolone cream (KENALOG) 0.1 %, APPLY TO AFFECTED AREA EXTERNALLY 2 TO 3 TIMES A DAY FOR RASH, Disp: 80 g, Rfl: 0 .  vitamin C (ASCORBIC ACID) 500 MG tablet, Take 500 mg by mouth daily. Reported on 10/10/2015, Disp: , Rfl:  .  vitamin E 400 UNIT capsule, Take 400 Units by mouth daily. Reported on 10/10/2015, Disp: , Rfl:  .  metroNIDAZOLE (FLAGYL) 500 MG tablet, Take 1 tablet (500 mg total) by mouth  2 (two) times daily., Disp: 14 tablet, Rfl: 0  Allergies  Allergen Reactions  . Atorvastatin     difficulty concentrating and focusing  . Terbinafine And Related Dermatitis    Rash, itch, skin discoloration    ROS  Ten systems reviewed and is negative except as mentioned in HPI   Objective   Vitals:   06/06/19 1528 06/06/19 1601  BP: (!) 150/90 140/70  Pulse: 71   Resp: 16   Temp: (!) 96.8 F (36 C)   TempSrc: Temporal   SpO2: 96%   Weight: 165 lb 12.8 oz (75.2 kg)   Height: 5\' 1"  (1.549 m)     Body mass index is 31.33 kg/m.    Physical Exam  Constitutional: Patient  appears well-developed and well-nourished. Obese No distress.  HEENT: head atraumatic, normocephalic, pupils equal and reactive to light Cardiovascular: Normal rate, regular rhythm and normal heart sounds.  No murmur heard. No BLE edema. Pulmonary/Chest: Effort normal and breath sounds normal. No respiratory distress. Abdominal: Soft.  There is no tenderness. GYN: severe vaginal atrophy, irritation from self swab on vaginal introitus, cervix normal and also normal bimanual exam, scant yellow /clear vaginal discharge and no odor Breast exam: feels lumpy/dense, but symmetrical and not lumps felt, we will change to screen mammogram  Psychiatric: Patient has a normal mood and affect. behavior is normal. Judgment and thought content normal.  Recent Results (from the past 2160 hour(s))  I-STAT creatinine     Status: None   Collection Time: 04/20/19  1:18 PM  Result Value Ref Range   Creatinine, Ser 0.70 0.44 - 1.00 mg/dL  Urine Culture     Status: None   Collection Time: 05/30/19 12:00 AM   Specimen: Urine  Result Value Ref Range   MICRO NUMBER: [id number]    SPECIMEN QUALITY: Adequate    Sample Source URINE    STATUS: FINAL    Result: No Growth   POCT urinalysis dipstick     Status: Abnormal   Collection Time: 05/30/19 10:25 AM  Result Value Ref Range   Color, UA yellow    Clarity, UA clear    Glucose, UA Negative Negative   Bilirubin, UA negative    Ketones, UA negative    Spec Grav, UA 1.020 1.010 - 1.025   Blood, UA negative    pH, UA 5.0 5.0 - 8.0   Protein, UA Positive (A) Negative   Urobilinogen, UA 0.2 0.2 or 1.0 E.U./dL   Nitrite, UA negative    Leukocytes, UA Trace (A) Negative   Appearance clear    Odor none   Cervicovaginal ancillary only     Status: None   Collection Time: 05/30/19 10:53 AM  Result Value Ref Range   Trichomonas Negative    Bacterial Vaginitis (gardnerella) Negative    Candida Vaginitis Negative    Candida Glabrata Negative    Comment Normal  Reference Range Candida Species - Negative    Comment Normal Reference Range Candida Galbrata - Negative    Comment Normal Reference Range Trichomonas - Negative    Comment      Normal Reference Range Bacterial Vaginosis - Negative     Assessment & Plan  1. Vaginal odor  - metroNIDAZOLE (FLAGYL) 500 MG tablet; Take 1 tablet (500 mg total) by mouth 2 (two) times daily.  Dispense: 14 tablet; Refill: 0   2. Encounter for screening mammogram for breast cancer  - MM Digital Screening; Future  3. Vaginal atrophy  Discussed topical estrogen  explained it may be the cause of vaginal odor, initially she said not but before she left she asked Tiffany to tell me she changed her mind

## 2019-06-07 ENCOUNTER — Other Ambulatory Visit: Payer: Self-pay | Admitting: Family Medicine

## 2019-06-07 DIAGNOSIS — Z1231 Encounter for screening mammogram for malignant neoplasm of breast: Secondary | ICD-10-CM

## 2019-06-23 ENCOUNTER — Other Ambulatory Visit: Payer: Self-pay | Admitting: Family Medicine

## 2019-06-23 DIAGNOSIS — I10 Essential (primary) hypertension: Secondary | ICD-10-CM

## 2019-07-16 ENCOUNTER — Other Ambulatory Visit: Payer: Self-pay | Admitting: Family Medicine

## 2019-07-16 ENCOUNTER — Ambulatory Visit: Payer: Medicare PPO | Attending: Internal Medicine

## 2019-07-16 DIAGNOSIS — Z23 Encounter for immunization: Secondary | ICD-10-CM | POA: Insufficient documentation

## 2019-07-16 NOTE — Progress Notes (Signed)
   Covid-19 Vaccination Clinic  Name:  Vickie Stewart    MRN: [id number] DOB: 1946/04/19  07/16/2019  [name] was observed post Covid-19 immunization for 15 minutes without incidence. She was provided with Vaccine Information Sheet and instruction to access the V-Safe system.   [name] was instructed to call 911 with any severe reactions post vaccine: Marland Kitchen Difficulty breathing  . Swelling of your face and throat  . A fast heartbeat  . A bad rash all over your body  . Dizziness and weakness    Immunizations Administered    Name Date Dose VIS Date Route   Pfizer COVID-19 Vaccine 07/16/2019 10:48 AM 0.3 mL 05/12/2019 Intramuscular   Manufacturer: [place]   Lot: [id number]   [place]ilt: [id number]

## 2019-07-18 DIAGNOSIS — H40153 Residual stage of open-angle glaucoma, bilateral: Secondary | ICD-10-CM | POA: Diagnosis not present

## 2019-07-20 ENCOUNTER — Ambulatory Visit: Payer: Medicare Other

## 2019-07-21 ENCOUNTER — Ambulatory Visit: Payer: Medicare Other

## 2019-08-08 ENCOUNTER — Other Ambulatory Visit: Payer: Self-pay

## 2019-08-08 ENCOUNTER — Other Ambulatory Visit (HOSPITAL_COMMUNITY)
Admission: RE | Admit: 2019-08-08 | Discharge: 2019-08-08 | Disposition: A | Discharge status: Home / Self Care | Payer: Medicare PPO | Source: Ambulatory Visit | Attending: Family Medicine | Admitting: Family Medicine

## 2019-08-08 ENCOUNTER — Encounter: Payer: Self-pay | Admitting: Family Medicine

## 2019-08-08 ENCOUNTER — Ambulatory Visit (INDEPENDENT_AMBULATORY_CARE_PROVIDER_SITE_OTHER): Payer: Medicare PPO | Admitting: Family Medicine

## 2019-08-08 VITALS — BP 130/80 | HR 60 | Temp 96.9°F | Resp 16 | Ht 61.0 in | Wt 159.4 lb

## 2019-08-08 DIAGNOSIS — Z0000 Encounter for general adult medical examination without abnormal findings: Secondary | ICD-10-CM

## 2019-08-08 DIAGNOSIS — Z8544 Personal history of malignant neoplasm of other female genital organs: Secondary | ICD-10-CM | POA: Diagnosis not present

## 2019-08-08 DIAGNOSIS — E7800 Pure hypercholesterolemia, unspecified: Secondary | ICD-10-CM | POA: Diagnosis not present

## 2019-08-08 DIAGNOSIS — R0683 Snoring: Secondary | ICD-10-CM

## 2019-08-08 DIAGNOSIS — K219 Gastro-esophageal reflux disease without esophagitis: Secondary | ICD-10-CM

## 2019-08-08 DIAGNOSIS — M8580 Other specified disorders of bone density and structure, unspecified site: Secondary | ICD-10-CM

## 2019-08-08 DIAGNOSIS — Z780 Asymptomatic menopausal state: Secondary | ICD-10-CM | POA: Diagnosis not present

## 2019-08-08 DIAGNOSIS — D72819 Decreased white blood cell count, unspecified: Secondary | ICD-10-CM

## 2019-08-08 DIAGNOSIS — I10 Essential (primary) hypertension: Secondary | ICD-10-CM

## 2019-08-08 DIAGNOSIS — E041 Nontoxic single thyroid nodule: Secondary | ICD-10-CM | POA: Insufficient documentation

## 2019-08-08 DIAGNOSIS — D329 Benign neoplasm of meninges, unspecified: Secondary | ICD-10-CM | POA: Diagnosis not present

## 2019-08-08 DIAGNOSIS — Z923 Personal history of irradiation: Secondary | ICD-10-CM | POA: Insufficient documentation

## 2019-08-08 DIAGNOSIS — Z08 Encounter for follow-up examination after completed treatment for malignant neoplasm: Secondary | ICD-10-CM | POA: Insufficient documentation

## 2019-08-08 MED ORDER — ROSUVASTATIN CALCIUM 10 MG PO TABS: 10.0000 mg | ORAL_TABLET | Freq: Every day | ORAL | 3 refills | Status: DC

## 2019-08-08 NOTE — Progress Notes (Addendum)
Name: Vickie Stewart   MRN: [id number]    DOB: [birth year]   Date:08/08/2019       Progress Note  Subjective  Chief Complaint  Chief Complaint  Patient presents with  . Annual Exam  . Follow-up    HPI  Patient presents for annual CPE and follow up  HTN: she is taking all medications at night. She states she usually gets nervous when she firsts come in to our office, but bp improves with rest. She denies chest pain or palpitation. BP at home is around 130's/80's. She snores and wakes feeling tired.   Snoring: she snores at night, she feels tired all the time, wakes up with headache in the morning. She falls asleep easily when sitting after lunch, sitting in the car as a passenger. ESS of 11 - done verbally with patient   Meningioma: still under the care of Dr. Lacinda Axon, last visit was Nov 2020. He stated on his note that since asymptomatic and size is stable and small he does not recommend surgery at this time.   GERD: she denies any heartburn, sometimes has to clear her throat, not on medication, she avoids spicy food and helps with symptoms.   Thyroid nodule: seen by Dr. Gabriel Carina in 2015 and was given reassurance. She denies dysphagia, change in bowel movements. She always has dry skin, she has eczema and is stable.   Hyperlipidemia: she takes Crestor, denies myalgias. Also taking fish oil  Leucopenia: we will repeat CBC today   Neck pain: she is stretching at home and is doing well. No paresthesias or weakness   History of vaginal cancer, s/p brachytherapy done at [facility] , last pap smear was done 2008 at [facility], we repeated pap smear last year, she would like to have it rechecked because the recurrent vaginal odor is similar to when she had vaginal wall cancer in the past   Diet: she states she is busy carrying for her father, she has lost weight because she has been eating less, not snacking as often, but still eats healthy   USPSTF grade A and B recommendations    Office Visit from  08/08/2019 in [facility]  AUDIT-C Score  0     Depression: Phq 9 is  negative Depression screen [facility] 2/9 08/08/2019 06/06/2019 05/30/2019 02/01/2019 12/14/2018  Decreased Interest 0 0 0 0 1  Down, Depressed, Hopeless 0 0 0 0 1  PHQ - 2 Score 0 0 0 0 2  Altered sleeping 1 0 0 0 0  Tired, decreased energy 1 0 0 0 1  Change in appetite 0 0 0 0 0  Feeling bad or failure about yourself  0 0 0 0 0  Trouble concentrating 1 0 0 0 1  Moving slowly or fidgety/restless 0 0 0 0 0  Suicidal thoughts 0 0 0 0 -  PHQ-9 Score 3 0 0 0 4  Difficult doing work/chores - Not difficult at all Not difficult at all - Not difficult at all  Some recent data might be hidden   Hypertension: BP Readings from Last 3 Encounters:  08/08/19 (!) 152/70  06/06/19 140/70  05/30/19 138/72   Obesity: Wt Readings from Last 3 Encounters:  08/08/19 159 lb 6.4 oz (72.3 kg)  06/06/19 165 lb 12.8 oz (75.2 kg)  05/30/19 167 lb (75.8 kg)   BMI Readings from Last 3 Encounters:  08/08/19 30.12 kg/m  06/06/19 31.33 kg/m  05/30/19 31.55 kg/m     Hep C  Screening: 2017  STD testing and prevention (HIV/chl/gon/syphilis): not interested  Intimate partner violence: negative screen  Sexual History (Partners/Practices/Protection from [employer] hx STI/Pregnancy Plans):not sexually active for over 10 years Pain during Intercourse: N/A Menstrual History/LMP/Abnormal Bleeding: s/p hysterectomy  Incontinence Symptoms: no problems  Breast cancer:  - Last Mammogram: re-scheduling because he will have mammogram since getting second covid vaccine this week  - BRCA gene screening: N/A  Osteoporosis: Discussed high calcium and vitamin D supplementation, weight bearing exercises, last bone density 2019, discussed repeating it now since she has a history of osteopenia  Cervical cancer screening: 2019 and normal   Skin cancer: Discussed monitoring for atypical lesions  Colorectal cancer: 09/2016  Lung cancer:   Low  Dose CT Chest recommended if Age 64-80 years, 30 pack-year currently smoking OR have quit w/in 15years. Patient does not qualify.   ECG: 08/2017   Advanced Care Planning: A voluntary discussion about advance care planning including the explanation and discussion of advance directives.  Discussed health care proxy and Living will, and the patient was able to identify a health care proxy as Glynn Octave, her brother .  Patient does have a living will at present time. If patient does have living will, I have requested they bring this to the clinic to be scanned in to their chart.  Lipids: Lab Results  Component Value Date   CHOL 134 07/29/2018   CHOL 141 07/09/2017   CHOL 153 05/27/2016   Lab Results  Component Value Date   HDL 60 07/29/2018   HDL 59 07/09/2017   HDL 57 05/27/2016   Lab Results  Component Value Date   LDLCALC 61 07/29/2018   LDLCALC 67 07/09/2017   LDLCALC 76 05/27/2016   Lab Results  Component Value Date   TRIG 58 07/29/2018   TRIG 70 07/09/2017   TRIG 100 05/27/2016   Lab Results  Component Value Date   CHOLHDL 2.2 07/29/2018   CHOLHDL 2.4 07/09/2017   CHOLHDL 4.0 07/26/2015   No results found for: LDLDIRECT  Glucose: Glucose, Bld  Date Value Ref Range Status  07/29/2018 85 65 - 99 mg/dL Final    Comment:    .            Fasting reference interval .   09/25/2017 102 (H) 65 - 99 mg/dL Final  07/09/2017 94 65 - 99 mg/dL Final    Comment:    .            Fasting reference interval .     Patient Active Problem List   Diagnosis Date Noted  . Left thyroid nodule 08/08/2019  . Neck pain 01/03/2019  . Meningioma ([place]) 10/04/2017  . Lichen sclerosus 70/48/8891  . Vaginal atrophy 03/18/2016  . Encounter for follow-up surveillance of vaginal cancer 03/18/2016  . Eczema 10/31/2015  . Left hand pain 09/10/2015  . Osteopenia after menopause 07/26/2015  . Menopause 07/26/2015  . Irritable colon 07/26/2015  . History of cancer of vagina  07/26/2015  . Diffuse cystic mastopathy 07/26/2015  . Obesity (BMI 30.0-34.9) 07/26/2015  . Intermittent low back pain 07/26/2015  . Hyperlipemia 07/26/2015  . Osteoarthritis 05/13/2015  . Allergic rhinitis 05/13/2015  . Gastroesophageal reflux disease without esophagitis 05/13/2015  . Essential hypertension, benign     Past Surgical History:  Procedure Laterality Date  . BREAST EXCISIONAL BIOPSY Left    neg  . BREAST LUMPECTOMY Left   . COLONOSCOPY  08/26/2006  . COLONOSCOPY WITH PROPOFOL N/A 10/21/2016  Procedure: COLONOSCOPY WITH PROPOFOL;  Surgeon: Christene Lye, MD;  Location: [facility] ENDOSCOPY;  Service: Endoscopy;  Laterality: N/A;    Family History  Problem Relation Age of Onset  . Hypertension Father   . Breast cancer Neg Hx     Social History   Socioeconomic History  . Marital status: Widowed    Spouse name: Not on file  . Number of children: 0  . Years of education: Not on file  . Highest education level: Some college, no degree  Occupational History  . Occupation: Radio producer: [employer]  Tobacco Use  . Smoking status: Never Smoker  . Smokeless tobacco: Never Used  . Tobacco comment: experimented with dip snuff as a child  Substance and Sexual Activity  . Alcohol use: No  . Drug use: No  . Sexual activity: Not Currently    Birth control/protection: Post-menopausal  Other Topics Concern  . Not on file  Social History Narrative  . Not on file   Social Determinants of Health   Financial Resource Strain: Low Risk   . Difficulty of Paying Living Expenses: Not hard at all  Food Insecurity: No Food Insecurity  . Worried About Charity fundraiser in the Last Year: Never true  . Ran Out of Food in the Last Year: Never true  Transportation Needs: No Transportation Needs  . Lack of Transportation (Medical): No  . Lack of Transportation (Non-Medical): No  Physical Activity: Insufficiently Active  . Days of Exercise per Week: 7 days  . Minutes  of Exercise per Session: 20 min  Stress: No Stress Concern Present  . Feeling of Stress : Only a little  Social Connections: Somewhat Isolated  . Frequency of Communication with Friends and Family: Twice a week  . Frequency of Social Gatherings with Friends and Family: Never  . Attends Religious Services: 1 to 4 times per year  . Active Member of Clubs or Organizations: Yes  . Attends Archivist Meetings: Never  . Marital Status: Widowed  Intimate Partner Violence: Not At Risk  . Fear of Current or Ex-Partner: No  . Emotionally Abused: No  . Physically Abused: No  . Sexually Abused: No     Current Outpatient Medications:  .  aspirin 81 MG tablet, Take 81 mg by mouth daily. Reported on 10/10/2015, Disp: , Rfl:  .  cholecalciferol (VITAMIN D) 1000 units tablet, Take 1,000 Units by mouth daily., Disp: , Rfl:  .  desonide (DESOWEN) 0.05 % cream, Apply topically 2 (two) times daily., Disp: 60 g, Rfl: 0 .  diltiazem (CARDIZEM CD) 360 MG 24 hr capsule, TAKE 1 CAPSULE (360 MG TOTAL) BY MOUTH DAILY., Disp: 90 capsule, Rfl: 1 .  estradiol (ESTRACE) 0.1 MG/GM vaginal cream, Place 1 Applicatorful vaginally 3 (three) times a week., Disp: 42.5 g, Rfl: 12 .  hydrALAZINE (APRESOLINE) 10 MG tablet, Take 1 tablet (10 mg total) by mouth 3 (three) times daily. If bp remains above 160/90, Disp: 30 tablet, Rfl: 0 .  latanoprost (XALATAN) 0.005 % ophthalmic solution, PLACE 1 DROP IN BOTH EYES AT BEDTIME, Disp: , Rfl: 4 .  lisinopril-hydrochlorothiazide (ZESTORETIC) 20-25 MG tablet, TAKE 1 TABLET BY MOUTH EVERY DAY, Disp: 90 tablet, Rfl: 0 .  Multiple Vitamin (MULTIVITAMIN) tablet, Take 1 tablet by mouth daily. Reported on 10/10/2015, Disp: , Rfl:  .  Omega-3 Fatty Acids (FISH OIL CONCENTRATE PO), Take by mouth. Reported on 10/10/2015, Disp: , Rfl:  .  rosuvastatin (CRESTOR) 10 MG  tablet, TAKE 1 TABLET BY MOUTH EVERY DAY, Disp: 90 tablet, Rfl: 3 .  triamcinolone cream (KENALOG) 0.1 %, APPLY TO AFFECTED  AREA EXTERNALLY 2 TO 3 TIMES A DAY FOR RASH, Disp: 80 g, Rfl: 0 .  vitamin C (ASCORBIC ACID) 500 MG tablet, Take 500 mg by mouth daily. Reported on 10/10/2015, Disp: , Rfl:  .  vitamin E 400 UNIT capsule, Take 400 Units by mouth daily. Reported on 10/10/2015, Disp: , Rfl:  .  metroNIDAZOLE (FLAGYL) 500 MG tablet, Take 1 tablet (500 mg total) by mouth 2 (two) times daily. (Patient not taking: Reported on 08/08/2019), Disp: 14 tablet, Rfl: 0  Allergies  Allergen Reactions  . Atorvastatin     difficulty concentrating and focusing  . Terbinafine And Related Dermatitis    Rash, itch, skin discoloration     ROS  Constitutional: Negative for fever, positive for mild  weight change.  Respiratory: Negative for cough and shortness of breath.   Cardiovascular: Negative for chest pain or palpitations.  Gastrointestinal: Negative for abdominal pain, no bowel changes.  Musculoskeletal: Negative for gait problem or joint swelling.  Skin: Negative for rash.  Neurological: Negative for dizziness or headache.  No other specific complaints in a complete review of systems (except as listed in HPI above).  Objective  Vitals:   08/08/19 1102  BP: (!) 152/70  Pulse: 60  Resp: 16  Temp: (!) 96.9 F (36.1 C)  TempSrc: Temporal  SpO2: 97%  Weight: 159 lb 6.4 oz (72.3 kg)  Height: 5' 1" (1.549 m)    Body mass index is 30.12 kg/m.  Physical Exam  Constitutional: Patient appears well-developed and well-nourished. No distress.  HENT: Head: Normocephalic and atraumatic. Ears: B TMs ok, no erythema or effusion; Nose: Nose normal. Mouth/Throat: not done Eyes: Conjunctivae and EOM are normal. Pupils are equal, round, and reactive to light. No scleral icterus.  Neck: Normal range of motion. Neck supple. No JVD present. No thyromegaly present.  Cardiovascular: Normal rate, regular rhythm and normal heart sounds.  No murmur heard. No BLE edema. Pulmonary/Chest: Effort normal and breath sounds normal. No  respiratory distress. Abdominal: Soft. Bowel sounds are normal, no distension. There is no tenderness. no masses Breast: no lumps or masses, no nipple discharge or rashes FEMALE GENITALIA:  External genitalia normal External urethra normal Vaginal vault normal showed some atrophy, no bleeding Cervix normal without discharge or lesions Bimanual exam normal without masses RECTAL: not done Musculoskeletal: Normal range of motion, no joint effusions. No gross deformities Neurological: he is alert and oriented to person, place, and time. No cranial nerve deficit. Coordination, balance, strength, speech and gait are normal.  Skin: Skin is warm and dry. No rash noted. No erythema.  Psychiatric: Patient has a normal mood and affect. behavior is normal. Judgment and thought content normal.  Recent Results (from the past 2160 hour(s))  Urine Culture     Status: None   Collection Time: 05/30/19 12:00 AM   Specimen: Urine  Result Value Ref Range   MICRO NUMBER: [id number]    SPECIMEN QUALITY: Adequate    Sample Source URINE    STATUS: FINAL    Result: No Growth   POCT urinalysis dipstick     Status: Abnormal   Collection Time: 05/30/19 10:25 AM  Result Value Ref Range   Color, UA yellow    Clarity, UA clear    Glucose, UA Negative Negative   Bilirubin, UA negative    Ketones, UA negative  Spec Grav, UA 1.020 1.010 - 1.025   Blood, UA negative    pH, UA 5.0 5.0 - 8.0   Protein, UA Positive (A) Negative   Urobilinogen, UA 0.2 0.2 or 1.0 E.U./dL   Nitrite, UA negative    Leukocytes, UA Trace (A) Negative   Appearance clear    Odor none   Cervicovaginal ancillary only     Status: None   Collection Time: 05/30/19 10:53 AM  Result Value Ref Range   Trichomonas Negative    Bacterial Vaginitis (gardnerella) Negative    Candida Vaginitis Negative    Candida Glabrata Negative    Comment Normal Reference Range Candida Species - Negative    Comment Normal Reference Range Candida Galbrata -  Negative    Comment Normal Reference Range Trichomonas - Negative    Comment      Normal Reference Range Bacterial Vaginosis - Negative     Fall Risk: Fall Risk  08/08/2019 06/06/2019 05/30/2019 02/01/2019 12/27/2018  Falls in the past year? 0 0 0 0 0  Number falls in past yr: 0 0 0 0 0  Injury with Fall? 0 0 0 0 0  Follow up - - Falls evaluation completed - Falls evaluation completed     Functional Status Survey: Is the patient deaf or have difficulty hearing?: No Does the patient have difficulty seeing, even when wearing glasses/contacts?: No Does the patient have difficulty concentrating, remembering, or making decisions?: No Does the patient have difficulty walking or climbing stairs?: No Does the patient have difficulty dressing or bathing?: No Does the patient have difficulty doing errands alone such as visiting a doctor's office or shopping?: No   Assessment & Plan  1. Well adult exam   2. Osteopenia after menopause  - DG Bone Density; Future - VITAMIN D 25 Hydroxy (Vit-D Deficiency, Fractures)  3. Essential hypertension, benign  - CBC with Differential/Platelet - COMPLETE METABOLIC PANEL WITH GFR  4. Meningioma (HCC)  No symptoms   5. Hypercholesteremia   - rosuvastatin (CRESTOR) 10 MG tablet; Take 1 tablet (10 mg total) by mouth daily.  Dispense: 90 tablet; Refill: 3 - Lipid panel  6. Left thyroid nodule  - TSH  7. Gastroesophageal reflux disease without esophagitis   8. Encounter for follow-up surveillance of vaginal cancer  - Cytology - PAP  9. Leukopenia, unspecified type  - CBC with Differential/Platelet   10. Snoring  Getting labs, consider sleep study  -USPSTF grade A and B recommendations reviewed with patient; age-appropriate recommendations, preventive care, screening tests, etc discussed and encouraged; healthy living encouraged; see AVS for patient education given to patient -Discussed importance of 150 minutes of physical activity  weekly, eat two servings of fish weekly, eat one serving of tree nuts ( cashews, pistachios, pecans, almonds.Marland Kitchen) every other day, eat 6 servings of fruit/vegetables daily and drink plenty of water and avoid sweet beverages.

## 2019-08-08 NOTE — Patient Instructions (Signed)
Preventive Care 38 Years and Older, Female Preventive care refers to lifestyle choices and visits with your health care provider that can promote health and wellness. This includes:  A yearly physical exam. This is also called an annual well check.  Regular dental and eye exams.  Immunizations.  Screening for certain conditions.  Healthy lifestyle choices, such as diet and exercise. What can I expect for my preventive care visit? Physical exam Your health care provider will check:  Height and weight. These may be used to calculate body mass index (BMI), which is a measurement that tells if you are at a healthy weight.  Heart rate and blood pressure.  Your skin for abnormal spots. Counseling Your health care provider may ask you questions about:  Alcohol, tobacco, and drug use.  Emotional well-being.  Home and relationship well-being.  Sexual activity.  Eating habits.  History of falls.  Memory and ability to understand (cognition).  Work and work Statistician.  Pregnancy and menstrual history. What immunizations do I need?  Influenza (flu) vaccine  This is recommended every year. Tetanus, diphtheria, and pertussis (Tdap) vaccine  You may need a Td booster every 10 years. Varicella (chickenpox) vaccine  You may need this vaccine if you have not already been vaccinated. Zoster (shingles) vaccine  You may need this after age 33. Pneumococcal conjugate (PCV13) vaccine  One dose is recommended after age 33. Pneumococcal polysaccharide (PPSV23) vaccine  One dose is recommended after age 72. Measles, mumps, and rubella (MMR) vaccine  You may need at least one dose of MMR if you were born in 1957 or later. You may also need a second dose. Meningococcal conjugate (MenACWY) vaccine  You may need this if you have certain conditions. Hepatitis A vaccine  You may need this if you have certain conditions or if you travel or work in places where you may be exposed  to hepatitis A. Hepatitis B vaccine  You may need this if you have certain conditions or if you travel or work in places where you may be exposed to hepatitis B. Haemophilus influenzae type b (Hib) vaccine  You may need this if you have certain conditions. You may receive vaccines as individual doses or as more than one vaccine together in one shot (combination vaccines). Talk with your health care provider about the risks and benefits of combination vaccines. What tests do I need? Blood tests  Lipid and cholesterol levels. These may be checked every 5 years, or more frequently depending on your overall health.  Hepatitis C test.  Hepatitis B test. Screening  Lung cancer screening. You may have this screening every year starting at age 39 if you have a 30-pack-year history of smoking and currently smoke or have quit within the past 15 years.  Colorectal cancer screening. All adults should have this screening starting at age 36 and continuing until age 15. Your health care provider may recommend screening at age 23 if you are at increased risk. You will have tests every 1-10 years, depending on your results and the type of screening test.  Diabetes screening. This is done by checking your blood sugar (glucose) after you have not eaten for a while (fasting). You may have this done every 1-3 years.  Mammogram. This may be done every 1-2 years. Talk with your health care provider about how often you should have regular mammograms.  BRCA-related cancer screening. This may be done if you have a family history of breast, ovarian, tubal, or peritoneal cancers.  Other tests  Sexually transmitted disease (STD) testing.  Bone density scan. This is done to screen for osteoporosis. You may have this done starting at age 44. Follow these instructions at home: Eating and drinking  Eat a diet that includes fresh fruits and vegetables, whole grains, lean protein, and low-fat dairy products. Limit  your intake of foods with high amounts of sugar, saturated fats, and salt.  Take vitamin and mineral supplements as recommended by your health care provider.  Do not drink alcohol if your health care provider tells you not to drink.  If you drink alcohol: ? Limit how much you have to 0-1 drink a day. ? Be aware of how much alcohol is in your drink. In the [place] one drink equals one 12 oz bottle of beer (355 mL), one 5 oz glass of wine (148 mL), or one 1 oz glass of hard liquor (44 mL). Lifestyle  Take daily care of your teeth and gums.  Stay active. Exercise for at least 30 minutes on 5 or more days each week.  Do not use any products that contain nicotine or tobacco, such as cigarettes, e-cigarettes, and chewing tobacco. If you need help quitting, ask your health care provider.  If you are sexually active, practice safe sex. Use a condom or other form of protection in order to prevent STIs (sexually transmitted infections).  Talk with your health care provider about taking a low-dose aspirin or statin. What's next?  Go to your health care provider once a year for a well check visit.  Ask your health care provider how often you should have your eyes and teeth checked.  Stay up to date on all vaccines. This information is not intended to replace advice given to you by your health care provider. Make sure you discuss any questions you have with your health care provider. Document Revised: 05/12/2018 Document Reviewed: 05/12/2018 Elsevier Patient Education  2020 [employer].

## 2019-08-09 LAB — COMPLETE METABOLIC PANEL WITH GFR
AG Ratio: 1.9 (calc) (ref 1.0–2.5)
ALT: 18 U/L (ref 6–29)
AST: 22 U/L (ref 10–35)
Albumin: 4.8 g/dL (ref 3.6–5.1)
Alkaline phosphatase (APISO): 44 U/L (ref 37–153)
BUN: 15 mg/dL (ref 7–25)
CO2: 30 mmol/L (ref 20–32)
Calcium: 10 mg/dL (ref 8.6–10.4)
Chloride: 100 mmol/L (ref 98–110)
Creat: 0.73 mg/dL (ref 0.60–0.93)
GFR, Est African American: 96 mL/min/{1.73_m2} (ref >=60)
GFR, Est Non African American: 83 mL/min/{1.73_m2} (ref >=60)
Globulin: 2.5 g/dL (calc) (ref 1.9–3.7)
Glucose, Bld: 79 mg/dL (ref 65–99)
Potassium: 3.9 mmol/L (ref 3.5–5.3)
Sodium: 138 mmol/L (ref 135–146)
Total Bilirubin: 0.6 mg/dL (ref 0.2–1.2)
Total Protein: 7.3 g/dL (ref 6.1–8.1)

## 2019-08-09 LAB — CBC WITH DIFFERENTIAL/PLATELET
Absolute Monocytes: 262 cells/uL (ref 200–950)
Basophils Absolute: 61 cells/uL (ref 0–200)
Basophils Relative: 1.9 %
Eosinophils Absolute: 61 cells/uL (ref 15–500)
Eosinophils Relative: 1.9 %
HCT: 45.5 % — ABNORMAL HIGH (ref 35.0–45.0)
Hemoglobin: 14.8 g/dL (ref 11.7–15.5)
Lymphs Abs: 1504 cells/uL (ref 850–3900)
MCH: 28.5 pg (ref 27.0–33.0)
MCHC: 32.5 g/dL (ref 32.0–36.0)
MCV: 87.5 fL (ref 80.0–100.0)
MPV: 10.8 fL (ref 7.5–12.5)
Monocytes Relative: 8.2 %
Neutro Abs: 1312 cells/uL — ABNORMAL LOW (ref 1500–7800)
Neutrophils Relative %: 41 %
Platelets: 263 10*3/uL (ref 140–400)
RBC: 5.2 10*6/uL — ABNORMAL HIGH (ref 3.80–5.10)
RDW: 13.1 % (ref 11.0–15.0)
Total Lymphocyte: 47 %
WBC: 3.2 10*3/uL — ABNORMAL LOW (ref 3.8–10.8)

## 2019-08-09 LAB — LIPID PANEL
Cholesterol: 154 mg/dL (ref <200)
HDL: 63 mg/dL (ref >=50)
LDL Cholesterol (Calc): 77 mg/dL (calc)
Non-HDL Cholesterol (Calc): 91 mg/dL (calc) (ref <130)
Total CHOL/HDL Ratio: 2.4 (calc) (ref <5.0)
Triglycerides: 68 mg/dL (ref <150)

## 2019-08-09 LAB — TSH: TSH: 1.42 mIU/L (ref 0.40–4.50)

## 2019-08-09 LAB — VITAMIN D 25 HYDROXY (VIT D DEFICIENCY, FRACTURES): Vit D, 25-Hydroxy: 37 ng/mL (ref 30–100)

## 2019-08-11 ENCOUNTER — Encounter: Payer: Self-pay | Admitting: Family Medicine

## 2019-08-11 DIAGNOSIS — D708 Other neutropenia: Secondary | ICD-10-CM | POA: Insufficient documentation

## 2019-08-11 DIAGNOSIS — D72819 Decreased white blood cell count, unspecified: Secondary | ICD-10-CM | POA: Insufficient documentation

## 2019-08-11 LAB — CYTOLOGY - PAP: Diagnosis: NEGATIVE

## 2019-08-12 ENCOUNTER — Ambulatory Visit: Payer: Medicare PPO | Attending: Internal Medicine

## 2019-08-12 ENCOUNTER — Other Ambulatory Visit: Payer: Self-pay

## 2019-08-12 DIAGNOSIS — Z23 Encounter for immunization: Secondary | ICD-10-CM

## 2019-08-12 NOTE — Progress Notes (Signed)
   Covid-19 Vaccination Clinic  Name:  Vickie Stewart    MRN: [id number] DOB: [birth year]  08/12/2019  [name] was observed post Covid-19 immunization for 15 minutes without incident. She was provided with Vaccine Information Sheet and instruction to access the V-Safe system.   Ms. Tell was instructed to call 911 with any severe reactions post vaccine: Marland Kitchen Difficulty breathing  . Swelling of face and throat  . A fast heartbeat  . A bad rash all over body  . Dizziness and weakness   Immunizations Administered    Name Date Dose VIS Date Route   Pfizer COVID-19 Vaccine 08/12/2019 12:44 PM 0.3 mL 05/12/2019 Intramuscular   Manufacturer: [place]   Lot: [id number]   [place]: [id number]

## 2019-08-12 NOTE — Progress Notes (Signed)
Normal pap smear.

## 2019-08-15 ENCOUNTER — Ambulatory Visit: Payer: Medicare PPO

## 2019-08-16 ENCOUNTER — Other Ambulatory Visit: Payer: Self-pay | Admitting: Family Medicine

## 2019-08-16 DIAGNOSIS — R718 Other abnormality of red blood cells: Secondary | ICD-10-CM

## 2019-08-16 DIAGNOSIS — G479 Sleep disorder, unspecified: Secondary | ICD-10-CM

## 2019-08-23 NOTE — Telephone Encounter (Signed)
Erroneous Entry  

## 2019-08-30 ENCOUNTER — Other Ambulatory Visit: Payer: Self-pay | Admitting: Family Medicine

## 2019-09-05 ENCOUNTER — Ambulatory Visit: Payer: Medicare PPO | Attending: Neurology

## 2019-09-05 DIAGNOSIS — G4733 Obstructive sleep apnea (adult) (pediatric): Secondary | ICD-10-CM | POA: Insufficient documentation

## 2019-09-06 ENCOUNTER — Ambulatory Visit: Payer: Self-pay | Admitting: *Deleted

## 2019-09-06 ENCOUNTER — Other Ambulatory Visit: Payer: Self-pay

## 2019-09-06 NOTE — Telephone Encounter (Signed)
Pt called in c/o "feeling like my breathing is different".   "It comes and goes".   "It's not really a shortness of breath just my breathing is different".    "I'm taking care of my father so it might be stress".   "My blood work is off so I did a sleep study last night at the clinic".  This was ordered by Dr. Ancil Boozer per pt.    "I felt like my breathing was different last night but then I settled down and was fine".    "I've been thinking about my blood work being off and maybe that's why my breathing is different".    She denies any underlying breathing problems.    I asked if she has anxiety attacks  after she mentioned her breathing could be different from anxiety.  She said,   "It could be anxiety because I'm taking care of my father and I'm thinking a lot about my blood work".     She was vague with her descriptions.   She was unable to tell me how long her breathing has been feeling different.  I had to ask a lot of questions to try and determine which course of action to take.  I attempted to get her an appt however Dr. Ancil Boozer is booked.    I tried to call the office but there was not an answer.   I let the pt know I would send a note and have someone call her.   She wanted to know when the sleep study results would be ready.   She was agreeable to this.  I went over the care advice and instructed her to call 911 if her breathing becomes worse.    She verbalized understanding.    I sent a high priority note to Dr. Ancil Boozer' office requesting someone give her a call.   She requested they call her on her cell [phone] because she is taking care of her father right now.  Reason for Disposition . [1] MODERATE longstanding difficulty breathing (e.g., speaks in phrases, SOB even at rest, pulse 100-120) AND [2] SAME as normal  Answer Assessment - Initial Assessment Questions 1. RESPIRATORY STATUS: "Describe your breathing?" (e.g., wheezing, shortness of breath, unable to speak, severe coughing)    I'm to have a sleep study because my red blood count is off.   I had both of my COVID-19 vaccines.   2. ONSET: "When did this breathing problem begin?"      I did a sleep test last night at the clinic.    This breathing problem comes and goes.  When I come in to have my BP done it makes me breath different.  This feels the same.   I think I have panic attacks.   3. PATTERN "Does the difficult breathing come and go, or has it been constant since it started?"      It comes and goes 4. SEVERITY: "How bad is your breathing?" (e.g., mild, moderate, severe)    - MILD: No SOB at rest, mild SOB with walking, speaks normally in sentences, can lay down, no retractions, pulse < 100.    - MODERATE: SOB at rest, SOB with minimal exertion and prefers to sit, cannot lie down flat, speaks in phrases, mild retractions, audible wheezing, pulse 100-120.    - SEVERE: Very SOB at rest, speaks in single words, struggling to breathe, sitting hunched forward, retractions, pulse > 120      I might get  a little winded in the yard. 5. RECURRENT SYMPTOM: "Have you had difficulty breathing before?" If so, ask: "When was the last time?" and "What happened that time?"      Yes on and off      I get indigestion a lot.   I take Tums and it helps my breathing. 6. CARDIAC HISTORY: "Do you have any history of heart disease?" (e.g., heart attack, angina, bypass surgery, angioplasty)      No 7. LUNG HISTORY: "Do you have any history of lung disease?"  (e.g., pulmonary embolus, asthma, emphysema)     No 8. CAUSE: "What do you think is causing the breathing problem?"      Maybe my blood work. 9. OTHER SYMPTOMS: "Do you have any other symptoms? (e.g., dizziness, runny nose, cough, chest pain, fever)     No coughing, I feel tired but I'm caring for my father.   "My breathing is just not what it used to be" 10. PREGNANCY: "Is there any chance you are pregnant?" "When was your last menstrual period?"       N/A 11. TRAVEL: "Have you  traveled out of the country in the last month?" (e.g., travel history, exposures)       No sick exposures.  Protocols used: BREATHING DIFFICULTY-A-AH

## 2019-09-08 ENCOUNTER — Telehealth: Payer: Self-pay | Admitting: Family Medicine

## 2019-09-08 NOTE — Telephone Encounter (Signed)
Spoke with Vickie Stewart (pt) she stated she is taking care of her father and she is unable to do a OV/or phone visit at this time.

## 2019-09-08 NOTE — Telephone Encounter (Signed)
Patient reports she is feeling better. Seems like after her sleep study test her breathing was different. She states that it has gotten better. She is under a lot of stress and she may have some anxiety.

## 2019-09-10 ENCOUNTER — Other Ambulatory Visit: Payer: Self-pay | Admitting: Family Medicine

## 2019-09-10 DIAGNOSIS — G4730 Sleep apnea, unspecified: Secondary | ICD-10-CM | POA: Diagnosis not present

## 2019-09-10 DIAGNOSIS — I10 Essential (primary) hypertension: Secondary | ICD-10-CM

## 2019-09-10 NOTE — Telephone Encounter (Signed)
Requested Prescriptions  Pending Prescriptions Disp Refills  . lisinopril-hydrochlorothiazide (ZESTORETIC) 20-25 MG tablet [Pharmacy Med Name: LISINOPRIL-HCTZ 20-25 MG TAB] 90 tablet 1    Sig: TAKE 1 TABLET BY MOUTH EVERY DAY     Cardiovascular:  ACEI + Diuretic Combos Passed - 09/10/2019  8:12 AM      Passed - Na in normal range and within 180 days    Sodium  Date Value Ref Range Status  08/08/2019 138 135 - 146 mmol/L Final  05/27/2016 142 134 - 144 mmol/L Final         Passed - K in normal range and within 180 days    Potassium  Date Value Ref Range Status  08/08/2019 3.9 3.5 - 5.3 mmol/L Final         Passed - Cr in normal range and within 180 days    Creat  Date Value Ref Range Status  08/08/2019 0.73 0.60 - 0.93 mg/dL Final    Comment:    For patients >50 years of age, the reference limit for Creatinine is approximately 13% higher for people identified as African-American. .          Passed - Ca in normal range and within 180 days    Calcium  Date Value Ref Range Status  08/08/2019 10.0 8.6 - 10.4 mg/dL Final         Passed - Patient is not pregnant      Passed - Last BP in normal range    BP Readings from Last 1 Encounters:  08/08/19 130/80         Passed - Valid encounter within last 6 months    Recent Outpatient Visits          1 month ago Well adult exam   [facility] Steele Sizer, MD   3 months ago Vaginal odor   [facility] Steele Sizer, MD   3 months ago Vaginal odor   [facility], FNP   7 months ago Essential hypertension, benign   [facility] Steele Sizer, MD   8 months ago Meningioma [facility])   [facility] Steele Sizer, MD      Future Appointments            In 5 months Ancil Boozer, Drue Stager, MD [facility]

## 2019-09-19 ENCOUNTER — Ambulatory Visit: Payer: Medicare PPO | Admitting: Family Medicine

## 2019-09-19 ENCOUNTER — Encounter: Payer: Self-pay | Admitting: Family Medicine

## 2019-09-19 ENCOUNTER — Other Ambulatory Visit: Payer: Self-pay

## 2019-09-19 VITALS — BP 134/82 | HR 69 | Temp 96.9°F | Resp 16 | Ht 61.0 in | Wt 159.4 lb

## 2019-09-19 DIAGNOSIS — D329 Benign neoplasm of meninges, unspecified: Secondary | ICD-10-CM

## 2019-09-19 DIAGNOSIS — F419 Anxiety disorder, unspecified: Secondary | ICD-10-CM | POA: Diagnosis not present

## 2019-09-19 DIAGNOSIS — G8929 Other chronic pain: Secondary | ICD-10-CM

## 2019-09-19 DIAGNOSIS — M549 Dorsalgia, unspecified: Secondary | ICD-10-CM

## 2019-09-19 DIAGNOSIS — G4733 Obstructive sleep apnea (adult) (pediatric): Secondary | ICD-10-CM

## 2019-09-19 DIAGNOSIS — F329 Major depressive disorder, single episode, unspecified: Secondary | ICD-10-CM | POA: Insufficient documentation

## 2019-09-19 DIAGNOSIS — K219 Gastro-esophageal reflux disease without esophagitis: Secondary | ICD-10-CM | POA: Diagnosis not present

## 2019-09-19 DIAGNOSIS — I10 Essential (primary) hypertension: Secondary | ICD-10-CM

## 2019-09-19 DIAGNOSIS — Z730 Burn-out: Secondary | ICD-10-CM | POA: Diagnosis not present

## 2019-09-19 DIAGNOSIS — E7800 Pure hypercholesterolemia, unspecified: Secondary | ICD-10-CM

## 2019-09-19 DIAGNOSIS — M542 Cervicalgia: Secondary | ICD-10-CM | POA: Diagnosis not present

## 2019-09-19 DIAGNOSIS — F32A Depression, unspecified: Secondary | ICD-10-CM | POA: Insufficient documentation

## 2019-09-19 MED ORDER — PREGABALIN 50 MG PO CAPS: 50.0000 mg | ORAL_CAPSULE | Freq: Three times a day (TID) | ORAL | 0 refills | Status: DC

## 2019-09-19 MED ORDER — OMEPRAZOLE 40 MG PO CPDR: 40.0000 mg | DELAYED_RELEASE_CAPSULE | Freq: Every day | ORAL | 0 refills | Status: DC

## 2019-09-19 MED ORDER — METAXALONE 800 MG PO TABS: 800.0000 mg | ORAL_TABLET | Freq: Three times a day (TID) | ORAL | 0 refills | Status: DC

## 2019-09-19 MED ORDER — CELECOXIB 200 MG PO CAPS: 200.0000 mg | ORAL_CAPSULE | Freq: Two times a day (BID) | ORAL | 1 refills | Status: DC

## 2019-09-19 NOTE — Progress Notes (Signed)
Name: Vickie Stewart   MRN: [id number]    DOB: [birth year]   Date:09/19/2019       Progress Note  Subjective  Chief Complaint  Chief Complaint  Patient presents with  . Constipation  . Anxiety Attacks    Has been having anxiety attacks-SOB-gets upset quick-States BP is up and down  . Back Pain    Upper back around shoulder is very sore and sensitive unable to lay back in her recliner.  . Labs Only    Talk about abnormal blood work-family history of thyroid issues  . Heartburn    Having more indigestion lately  . Weight Loss    States she was 175 pounds and has been losing weight without trying too    HPI  HTN: she is taking all medications at night. She states she usually gets nervous when she firsts come in to our office, but bp improves with rest. She states that usually high at home when she first checks in the 150's, but with rest it goes down to 120-130's, she always feels anxious before checking her bp She denies chest pain or palpitation.   Meningioma: still under the care of Dr. Lacinda Axon, last visit was Nov 2020. He stated on his note that since asymptomatic and size is stable and small he does not recommend surgery at this time. Unchanged   GERD: she states recently having heart burn again, even though she is still following a GERD diet, she is taking ibuprofen or indigestion tablets otc, we will resume PPI, advised to avoid ibuprofen and we will try celebrex instead   Thyroid nodule: seen by Dr. Gabriel Carina in 2015 and was given reassurance. She denies dysphagia, change in bowel movements. Last TSH was normal   Hyperlipidemia: she takes Crestor, denies myalgias. Also taking fish oil. 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 They started 24 hour care since  06/2018. She rotates care with brothers. She goes to his house at 7:30 am and stay until 7 pm every day, brothers are alternating nights. She has been stressed out, her dad does not want to move with her. She states that she has noticed difficulty maintaining her house and she is always rushing . She is feeling anxious , has episodes of panic attacks and is causing her to be forgetful.  Neck pain: she had PT but continues to have pain, she states stiffness and has burning sensation that radiates down her shoulders, difficulty sitting for a long time and sometimes it goes down to her lower back.   No paresthesias or weakness . Denies bowel or bladder incontinence.   History of vaginal cancer, s/p brachytherapy done at [facility] , last pap smear was done 2021 and normal   Patient Active Problem List   Diagnosis Date Noted  . Left thyroid nodule 08/08/2019  . Neck pain 01/03/2019  . Meningioma ([place]) 10/04/2017  . Lichen sclerosus [id number]  . Vaginal atrophy 03/18/2016  . Encounter for follow-up surveillance of vaginal cancer 03/18/2016  . Eczema 10/31/2015  . Left hand pain 09/10/2015  . Osteopenia after menopause 07/26/2015  . Menopause 07/26/2015  . Irritable colon 07/26/2015  . History of cancer of vagina 07/26/2015  . Diffuse cystic mastopathy 07/26/2015  . Obesity (  BMI 30.0-34.9) 07/26/2015  . Intermittent low back pain 07/26/2015  . Hyperlipemia 07/26/2015  . Osteoarthritis 05/13/2015  . Allergic rhinitis 05/13/2015  . Gastroesophageal reflux disease without esophagitis 05/13/2015  . Essential hypertension, benign     Past Surgical History:  Procedure Laterality Date  . BREAST EXCISIONAL BIOPSY Left    neg  . BREAST LUMPECTOMY Left   . COLONOSCOPY  08/26/2006  . COLONOSCOPY WITH PROPOFOL N/A 10/21/2016   Procedure: COLONOSCOPY WITH PROPOFOL;  Surgeon: Christene Lye, MD;  Location: [facility] ENDOSCOPY;  Service: Endoscopy;  Laterality: N/A;    Family History  Problem  Relation Age of Onset  . Hypertension Father   . Breast cancer Neg Hx     Social History   Tobacco Use  . Smoking status: Never Smoker  . Smokeless tobacco: Never Used  . Tobacco comment: experimented with dip snuff as a child  Substance Use Topics  . Alcohol use: No     Current Outpatient Medications:  .  aspirin 81 MG tablet, Take 81 mg by mouth daily. Reported on 10/10/2015, Disp: , Rfl:  .  cholecalciferol (VITAMIN D) 1000 units tablet, Take 1,000 Units by mouth daily., Disp: , Rfl:  .  desonide (DESOWEN) 0.05 % cream, Apply topically 2 (two) times daily., Disp: 60 g, Rfl: 0 .  diltiazem (CARDIZEM CD) 360 MG 24 hr capsule, TAKE 1 CAPSULE (360 MG TOTAL) BY MOUTH DAILY., Disp: 90 capsule, Rfl: 1 .  estradiol (ESTRACE) 0.1 MG/GM vaginal cream, Place 1 Applicatorful vaginally 3 (three) times a week., Disp: 42.5 g, Rfl: 12 .  hydrALAZINE (APRESOLINE) 10 MG tablet, Take 1 tablet (10 mg total) by mouth 3 (three) times daily. If bp remains above 160/90, Disp: 30 tablet, Rfl: 0 .  latanoprost (XALATAN) 0.005 % ophthalmic solution, PLACE 1 DROP IN BOTH EYES AT BEDTIME, Disp: , Rfl: 4 .  lisinopril-hydrochlorothiazide (ZESTORETIC) 20-25 MG tablet, TAKE 1 TABLET BY MOUTH EVERY DAY, Disp: 90 tablet, Rfl: 1 .  Multiple Vitamin (MULTIVITAMIN) tablet, Take 1 tablet by mouth daily. Reported on 10/10/2015, Disp: , Rfl:  .  Omega-3 Fatty Acids (FISH OIL CONCENTRATE PO), Take by mouth. Reported on 10/10/2015, Disp: , Rfl:  .  rosuvastatin (CRESTOR) 10 MG tablet, Take 1 tablet (10 mg total) by mouth daily., Disp: 90 tablet, Rfl: 3 .  triamcinolone cream (KENALOG) 0.1 %, APPLY TO AFFECTED AREA EXTERNALLY 2 TO 3 TIMES A DAY FOR RASH, Disp: 80 g, Rfl: 0 .  vitamin C (ASCORBIC ACID) 500 MG tablet, Take 500 mg by mouth daily. Reported on 10/10/2015, Disp: , Rfl:  .  vitamin E 400 UNIT capsule, Take 400 Units by mouth daily. Reported on 10/10/2015, Disp: , Rfl:   Allergies  Allergen Reactions  . Atorvastatin      difficulty concentrating and focusing  . Terbinafine And Related Dermatitis    Rash, itch, skin discoloration    I personally reviewed active problem list, medication list, allergies, family history, social history, health maintenance with the patient/caregiver today.   ROS  Constitutional: Negative for fever , positive for weight change.  Respiratory: Negative for cough and shortness of breath.   Cardiovascular: Negative for chest pain or palpitations.  Gastrointestinal: Negative for abdominal pain, no bowel changes.  Musculoskeletal: Negative for gait problem or joint swelling.  Skin: Negative for rash.  Neurological: Negative for dizziness or headache.  No other specific complaints in a complete review of systems (except as listed in HPI above).  Objective  Vitals:  09/19/19 1528  BP: 134/82  Pulse: 69  Resp: 16  Temp: (!) 96.9 F (36.1 C)  TempSrc: Temporal  SpO2: 98%  Weight: 159 lb 6.4 oz (72.3 kg)  Height: 5\' 1"  (1.549 m)    Body mass index is 30.12 kg/m.  Physical Exam  Constitutional: Patient appears well-developed and well-nourished. Obese No distress.  HEENT: head atraumatic, normocephalic, pupils equal and reactive to light, pain during rom of her neck  Cardiovascular: Normal rate, regular rhythm and normal heart sounds.  No murmur heard. No BLE edema. Pulmonary/Chest: Effort normal and breath sounds normal. No respiratory distress. Abdominal: Soft.  There is no tenderness. Psychiatric: Patient has a normal mood and affect. behavior is normal. Judgment and thought content normal.   Recent Results (from the past 2160 hour(s))  Cytology - PAP     Status: None   Collection Time: 08/08/19 11:54 AM  Result Value Ref Range   Adequacy Satisfactory for evaluation.    Diagnosis      - Negative for intraepithelial lesion or malignancy (NILM)  CBC with Differential/Platelet     Status: Abnormal   Collection Time: 08/08/19 12:14 PM  Result Value Ref Range    WBC 3.2 (L) 3.8 - 10.8 Thousand/uL   RBC 5.20 (H) 3.80 - 5.10 Million/uL   Hemoglobin 14.8 11.7 - 15.5 g/dL   HCT 45.5 (H) 35.0 - 45.0 %   MCV 87.5 80.0 - 100.0 fL   MCH 28.5 27.0 - 33.0 pg   MCHC 32.5 32.0 - 36.0 g/dL   RDW 13.1 11.0 - 15.0 %   Platelets 263 140 - 400 Thousand/uL   MPV 10.8 7.5 - 12.5 fL   Neutro Abs 1,312 (L) 1,500 - 7,800 cells/uL   Lymphs Abs 1,504 850 - 3,900 cells/uL   Absolute Monocytes 262 200 - 950 cells/uL   Eosinophils Absolute 61 15 - 500 cells/uL   Basophils Absolute 61 0 - 200 cells/uL   Neutrophils Relative % 41 %   Total Lymphocyte 47.0 %   Monocytes Relative 8.2 %   Eosinophils Relative 1.9 %   Basophils Relative 1.9 %  COMPLETE METABOLIC PANEL WITH GFR     Status: None   Collection Time: 08/08/19 12:14 PM  Result Value Ref Range   Glucose, Bld 79 65 - 99 mg/dL    Comment: .            Fasting reference interval .    BUN 15 7 - 25 mg/dL   Creat 0.73 0.60 - 0.93 mg/dL    Comment: For patients >68 years of age, the reference limit for Creatinine is approximately 13% higher for people identified as African-American. .    GFR, Est Non African American 83 > OR = 60 mL/min/1.26m2   GFR, Est African American 96 > OR = 60 mL/min/1.59m2   BUN/Creatinine Ratio NOT APPLICABLE 6 - 22 (calc)   Sodium 138 135 - 146 mmol/L   Potassium 3.9 3.5 - 5.3 mmol/L   Chloride 100 98 - 110 mmol/L   CO2 30 20 - 32 mmol/L   Calcium 10.0 8.6 - 10.4 mg/dL   Total Protein 7.3 6.1 - 8.1 g/dL   Albumin 4.8 3.6 - 5.1 g/dL   Globulin 2.5 1.9 - 3.7 g/dL (calc)   AG Ratio 1.9 1.0 - 2.5 (calc)   Total Bilirubin 0.6 0.2 - 1.2 mg/dL   Alkaline phosphatase (APISO) 44 37 - 153 U/L   AST 22 10 - 35 U/L   ALT  18 6 - 29 U/L  TSH     Status: None   Collection Time: 08/08/19 12:14 PM  Result Value Ref Range   TSH 1.42 0.40 - 4.50 mIU/L  VITAMIN D 25 Hydroxy (Vit-D Deficiency, Fractures)     Status: None   Collection Time: 08/08/19 12:14 PM  Result Value Ref Range   Vit  D, 25-Hydroxy 37 30 - 100 ng/mL    Comment: Vitamin D Status         25-OH Vitamin D: . Deficiency:                    <20 ng/mL Insufficiency:             20 - 29 ng/mL Optimal:                 > or = 30 ng/mL . For 25-OH Vitamin D testing on patients on  D2-supplementation and patients for whom quantitation  of D2 and D3 fractions is required, the [facility](TM) 25-OH VIT D, (D2,D3), LC/MS/MS is recommended: order  code [phone] (patients >54yrs). See Note 1 . Note 1 . For additional information, please refer to  http://education.[facility].com/faq/FAQ199  (This link is being provided for informational/ educational purposes only.)   Lipid panel     Status: None   Collection Time: 08/08/19 12:14 PM  Result Value Ref Range   Cholesterol 154 <200 mg/dL   HDL 63 > OR = 50 mg/dL   Triglycerides 68 <150 mg/dL   LDL Cholesterol (Calc) 77 mg/dL (calc)    Comment: Reference range: <100 . Desirable range <100 mg/dL for primary prevention;   <70 mg/dL for patients with CHD or diabetic patients  with > or = 2 CHD risk factors. Marland Kitchen LDL-C is now calculated using the Martin-Hopkins  calculation, which is a validated novel method providing  better accuracy than the Friedewald equation in the  estimation of LDL-C.  Cresenciano Genre et al. Annamaria Helling. [id number]): 2061-2068  (http://education.[facility].com/faq/FAQ164)    Total CHOL/HDL Ratio 2.4 <5.0 (calc)   Non-HDL Cholesterol (Calc) 91 <130 mg/dL (calc)    Comment: For patients with diabetes plus 1 major ASCVD risk  factor, treating to a non-HDL-C goal of <100 mg/dL  (LDL-C of <70 mg/dL) is considered a therapeutic  option.      PHQ2/9: Depression screen [facility] 2/9 09/19/2019 08/08/2019 06/06/2019 05/30/2019 02/01/2019  Decreased Interest 1 0 0 0 0  Down, Depressed, Hopeless 0 0 0 0 0  PHQ - 2 Score 1 0 0 0 0  Altered sleeping 1 1 0 0 0  Tired, decreased energy 1 1 0 0 0  Change in appetite 1 0 0 0 0  Feeling bad or failure about  yourself  0 0 0 0 0  Trouble concentrating 0 1 0 0 0  Moving slowly or fidgety/restless 0 0 0 0 0  Suicidal thoughts 0 0 0 0 0  PHQ-9 Score 4 3 0 0 0  Difficult doing work/chores - - Not difficult at all Not difficult at all -  Some recent data might be hidden    phq 9 is positive  GAD 7 : Generalized Anxiety Score 09/19/2019 08/08/2019 12/14/2018 07/29/2018  Nervous, Anxious, on Edge 2 1 1 1   Control/stop worrying 2 1 0 1  Worry too much - different things 1 1 1 1   Trouble relaxing 1 1 1 1   Restless 0 0 0 0  Easily annoyed or irritable 1 1 1  0  Afraid -  awful might happen 1 0 0 0  Total GAD 7 Score 8 5 4 4   Anxiety Difficulty Somewhat difficult Somewhat difficult Not difficult at all Not difficult at all    Fall Risk: Fall Risk  09/19/2019 08/08/2019 06/06/2019 05/30/2019 02/01/2019  Falls in the past year? 0 0 0 0 0  Number falls in past yr: 0 0 0 0 0  Injury with Fall? 0 0 0 0 0  Follow up - - - Falls evaluation completed -     Functional Status Survey: Is the patient deaf or have difficulty hearing?: No Does the patient have difficulty seeing, even when wearing glasses/contacts?: No Does the patient have difficulty concentrating, remembering, or making decisions?: No Does the patient have difficulty walking or climbing stairs?: No Does the patient have difficulty dressing or bathing?: No Does the patient have difficulty doing errands alone such as visiting a doctor's office or shopping?: No    Assessment & Plan  1. Anxiety and depression  Refused medication   2. OSA (obstructive sleep apnea)  She is going back for titration study   3. Meningioma ([facility])  Unchanged   4. Essential hypertension, benign  stable  5. Gastroesophageal reflux disease without esophagitis  Resume medication  - omeprazole (PRILOSEC) 40 MG capsule; Take 1 capsule (40 mg total) by mouth daily.  Dispense: 90 capsule; Refill: 0  6. Pure hypercholesterolemia   7. Burnout of caregiver  Spoke  to patient and her brother, they will try to find a way to give her two days off a week   20. Chronic neck and back pain  - pregabalin (LYRICA) 50 MG capsule; Take 1 capsule (50 mg total) by mouth 3 (three) times daily.  Dispense: 90 capsule; Refill: 0 - celecoxib (CELEBREX) 200 MG capsule; Take 1 capsule (200 mg total) by mouth 2 (two) times daily.  Dispense: 90 capsule; Refill: 1 - metaxalone (SKELAXIN) 800 MG tablet; Take 1 tablet (800 mg total) by mouth 3 (three) times daily.  Dispense: 90 tablet; Refill: 0

## 2019-09-21 ENCOUNTER — Ambulatory Visit: Payer: Medicare PPO | Attending: Internal Medicine

## 2019-09-21 DIAGNOSIS — Z20822 Contact with and (suspected) exposure to covid-19: Secondary | ICD-10-CM | POA: Diagnosis not present

## 2019-09-22 LAB — NOVEL CORONAVIRUS, NAA: SARS-CoV-2, NAA: NOT DETECTED

## 2019-09-22 LAB — SARS-COV-2, NAA 2 DAY TAT

## 2019-09-28 ENCOUNTER — Ambulatory Visit: Payer: Medicare PPO | Attending: Family Medicine

## 2019-09-28 DIAGNOSIS — G4733 Obstructive sleep apnea (adult) (pediatric): Secondary | ICD-10-CM | POA: Insufficient documentation

## 2019-09-29 ENCOUNTER — Other Ambulatory Visit: Payer: Self-pay

## 2019-09-30 DIAGNOSIS — G4733 Obstructive sleep apnea (adult) (pediatric): Secondary | ICD-10-CM | POA: Diagnosis not present

## 2019-10-03 ENCOUNTER — Ambulatory Visit
Admission: RE | Admit: 2019-10-03 | Discharge: 2019-10-03 | Disposition: A | Discharge status: Home / Self Care | Payer: Medicare PPO | Source: Ambulatory Visit | Attending: Family Medicine | Admitting: Family Medicine

## 2019-10-03 DIAGNOSIS — M8589 Other specified disorders of bone density and structure, multiple sites: Secondary | ICD-10-CM | POA: Insufficient documentation

## 2019-10-03 DIAGNOSIS — Z1231 Encounter for screening mammogram for malignant neoplasm of breast: Secondary | ICD-10-CM | POA: Insufficient documentation

## 2019-10-03 DIAGNOSIS — Z780 Asymptomatic menopausal state: Secondary | ICD-10-CM

## 2019-10-03 DIAGNOSIS — M8580 Other specified disorders of bone density and structure, unspecified site: Secondary | ICD-10-CM | POA: Diagnosis present

## 2019-10-04 ENCOUNTER — Telehealth: Payer: Self-pay

## 2019-10-04 DIAGNOSIS — G4733 Obstructive sleep apnea (adult) (pediatric): Secondary | ICD-10-CM

## 2019-10-06 ENCOUNTER — Encounter (INDEPENDENT_AMBULATORY_CARE_PROVIDER_SITE_OTHER): Payer: Medicare PPO | Admitting: Family Medicine

## 2019-10-17 NOTE — Telephone Encounter (Signed)
Error

## 2019-10-18 NOTE — Progress Notes (Deleted)
No additional note needed 

## 2019-10-20 ENCOUNTER — Encounter: Payer: Self-pay | Admitting: Family Medicine

## 2019-10-20 ENCOUNTER — Other Ambulatory Visit: Payer: Self-pay

## 2019-10-20 ENCOUNTER — Ambulatory Visit: Payer: Medicare PPO | Admitting: Family Medicine

## 2019-10-20 VITALS — BP 120/68 | HR 62 | Temp 97.1°F | Resp 16 | Ht 61.0 in | Wt 157.2 lb

## 2019-10-20 DIAGNOSIS — G4733 Obstructive sleep apnea (adult) (pediatric): Secondary | ICD-10-CM

## 2019-10-20 DIAGNOSIS — F32A Depression, unspecified: Secondary | ICD-10-CM

## 2019-10-20 DIAGNOSIS — F329 Major depressive disorder, single episode, unspecified: Secondary | ICD-10-CM

## 2019-10-20 DIAGNOSIS — R634 Abnormal weight loss: Secondary | ICD-10-CM

## 2019-10-20 DIAGNOSIS — F419 Anxiety disorder, unspecified: Secondary | ICD-10-CM | POA: Diagnosis not present

## 2019-10-20 DIAGNOSIS — K625 Hemorrhage of anus and rectum: Secondary | ICD-10-CM | POA: Diagnosis not present

## 2019-10-20 DIAGNOSIS — I10 Essential (primary) hypertension: Secondary | ICD-10-CM

## 2019-10-20 MED ORDER — OLMESARTAN MEDOXOMIL 40 MG PO TABS: 40.0000 mg | ORAL_TABLET | Freq: Every day | ORAL | 0 refills | Status: DC

## 2019-10-20 NOTE — Progress Notes (Signed)
Name: Vickie Stewart   MRN: [id number]    DOB: 03/14/1946   Date:10/20/2019       Progress Note  Subjective  Chief Complaint  Chief Complaint  Patient presents with  . Follow-up  . Sleep Apnea    folow up test results    HPI   HTN: she has been compliant with medication, bp today was at goal, but she does not like taking diuretics so we will change from lisinopril hctz to Benicar and monitor . She denies lower extremity edema or chest pain  OSA: she had a sleep study done end of April/titration, discussed results with patient and explained importance of compliance with CPAP machine   Weight loss: she is now having rectal bleeding, discussed importance of evaluation, she is willing to see gastroenterologist at this time     Patient Active Problem List   Diagnosis Date Noted  . Burnout of caregiver 09/19/2019  . OSA (obstructive sleep apnea) 09/19/2019  . Anxiety and depression 09/19/2019  . Left thyroid nodule 08/08/2019  . Neck pain 01/03/2019  . Meningioma ([employer]) 10/04/2017  . Lichen sclerosus [id number]  . Vaginal atrophy 03/18/2016  . Encounter for follow-up surveillance of vaginal cancer 03/18/2016  . Eczema 10/31/2015  . Left hand pain 09/10/2015  . Osteopenia after menopause 07/26/2015  . Menopause 07/26/2015  . Irritable colon 07/26/2015  . History of cancer of vagina 07/26/2015  . Diffuse cystic mastopathy 07/26/2015  . Obesity (BMI 30.0-34.9) 07/26/2015  . Intermittent low back pain 07/26/2015  . Hyperlipemia 07/26/2015  . Osteoarthritis 05/13/2015  . Allergic rhinitis 05/13/2015  . Gastroesophageal reflux disease without esophagitis 05/13/2015  . Essential hypertension, benign     Past Surgical History:  Procedure Laterality Date  . BREAST EXCISIONAL BIOPSY Left    neg  . BREAST LUMPECTOMY Left   . COLONOSCOPY  08/26/2006  . COLONOSCOPY WITH PROPOFOL N/A 10/21/2016   Procedure: COLONOSCOPY WITH PROPOFOL;  Surgeon: Christene Lye, MD;   Location: [facility] ENDOSCOPY;  Service: Endoscopy;  Laterality: N/A;    Family History  Problem Relation Age of Onset  . Hypertension Father   . Breast cancer Neg Hx     Social History   Tobacco Use  . Smoking status: Never Smoker  . Smokeless tobacco: Never Used  . Tobacco comment: experimented with dip snuff as a child  Substance Use Topics  . Alcohol use: No     Current Outpatient Medications:  .  aspirin 81 MG tablet, Take 81 mg by mouth daily. Reported on 10/10/2015, Disp: , Rfl:  .  celecoxib (CELEBREX) 200 MG capsule, Take 1 capsule (200 mg total) by mouth 2 (two) times daily., Disp: 90 capsule, Rfl: 1 .  cholecalciferol (VITAMIN D) 1000 units tablet, Take 1,000 Units by mouth daily., Disp: , Rfl:  .  desonide (DESOWEN) 0.05 % cream, Apply topically 2 (two) times daily., Disp: 60 g, Rfl: 0 .  diltiazem (CARDIZEM CD) 360 MG 24 hr capsule, TAKE 1 CAPSULE (360 MG TOTAL) BY MOUTH DAILY., Disp: 90 capsule, Rfl: 1 .  estradiol (ESTRACE) 0.1 MG/GM vaginal cream, Place 1 Applicatorful vaginally 3 (three) times a week., Disp: 42.5 g, Rfl: 12 .  hydrALAZINE (APRESOLINE) 10 MG tablet, Take 1 tablet (10 mg total) by mouth 3 (three) times daily. If bp remains above 160/90, Disp: 30 tablet, Rfl: 0 .  latanoprost (XALATAN) 0.005 % ophthalmic solution, PLACE 1 DROP IN BOTH EYES AT BEDTIME, Disp: , Rfl: 4 .  lisinopril-hydrochlorothiazide (ZESTORETIC) 20-25  MG tablet, TAKE 1 TABLET BY MOUTH EVERY DAY, Disp: 90 tablet, Rfl: 1 .  metaxalone (SKELAXIN) 800 MG tablet, Take 1 tablet (800 mg total) by mouth 3 (three) times daily., Disp: 90 tablet, Rfl: 0 .  Multiple Vitamin (MULTIVITAMIN) tablet, Take 1 tablet by mouth daily. Reported on 10/10/2015, Disp: , Rfl:  .  Omega-3 Fatty Acids (FISH OIL CONCENTRATE PO), Take by mouth. Reported on 10/10/2015, Disp: , Rfl:  .  omeprazole (PRILOSEC) 40 MG capsule, Take 1 capsule (40 mg total) by mouth daily., Disp: 90 capsule, Rfl: 0 .  pregabalin (LYRICA) 50 MG  capsule, Take 1 capsule (50 mg total) by mouth 3 (three) times daily., Disp: 90 capsule, Rfl: 0 .  rosuvastatin (CRESTOR) 10 MG tablet, Take 1 tablet (10 mg total) by mouth daily., Disp: 90 tablet, Rfl: 3 .  triamcinolone cream (KENALOG) 0.1 %, APPLY TO AFFECTED AREA EXTERNALLY 2 TO 3 TIMES A DAY FOR RASH, Disp: 80 g, Rfl: 0 .  vitamin C (ASCORBIC ACID) 500 MG tablet, Take 500 mg by mouth daily. Reported on 10/10/2015, Disp: , Rfl:  .  vitamin E 400 UNIT capsule, Take 400 Units by mouth daily. Reported on 10/10/2015, Disp: , Rfl:   Allergies  Allergen Reactions  . Atorvastatin     difficulty concentrating and focusing  . Terbinafine And Related Dermatitis    Rash, itch, skin discoloration    I personally reviewed active problem list, medication list, allergies, family history, social history, health maintenance with the patient/caregiver today.   ROS  Constitutional: Negative for fever, positive for  weight change.  Respiratory: Negative for cough and shortness of breath.   Cardiovascular: Negative for chest pain or palpitations.  Gastrointestinal: Negative for abdominal pain, no bowel changes.  Musculoskeletal: Negative for gait problem or joint swelling.  Skin: Negative for rash.  Neurological: Negative for dizziness or headache.  No other specific complaints in a complete review of systems (except as listed in HPI above).  Objective  Vitals:   10/20/19 1139  BP: 120/68  Pulse: 62  Resp: 16  Temp: (!) 97.1 F (36.2 C)  TempSrc: Temporal  SpO2: 98%  Weight: 157 lb 3.2 oz (71.3 kg)  Height: 5\' 1"  (1.549 m)    Body mass index is 29.7 kg/m.  Physical Exam  Constitutional: Patient appears well-developed and well-nourished.  No distress.  HEENT: head atraumatic, normocephalic, pupils equal and reactive to light, neck supple Cardiovascular: Normal rate, regular rhythm and normal heart sounds.  No murmur heard. No BLE edema. Pulmonary/Chest: Effort normal and breath sounds  normal. No respiratory distress. Abdominal: Soft.  There is no tenderness. Psychiatric: Patient has a normal mood and affect. behavior is normal. Judgment and thought content normal.  Recent Results (from the past 2160 hour(s))  Cytology - PAP     Status: None   Collection Time: 08/08/19 11:54 AM  Result Value Ref Range   Adequacy Satisfactory for evaluation.    Diagnosis      - Negative for intraepithelial lesion or malignancy (NILM)  CBC with Differential/Platelet     Status: Abnormal   Collection Time: 08/08/19 12:14 PM  Result Value Ref Range   WBC 3.2 (L) 3.8 - 10.8 Thousand/uL   RBC 5.20 (H) 3.80 - 5.10 Million/uL   Hemoglobin 14.8 11.7 - 15.5 g/dL   HCT 45.5 (H) 35.0 - 45.0 %   MCV 87.5 80.0 - 100.0 fL   MCH 28.5 27.0 - 33.0 pg   MCHC 32.5 32.0 -  36.0 g/dL   RDW 13.1 11.0 - 15.0 %   Platelets 263 140 - 400 Thousand/uL   MPV 10.8 7.5 - 12.5 fL   Neutro Abs 1,312 (L) 1,500 - 7,800 cells/uL   Lymphs Abs 1,504 850 - 3,900 cells/uL   Absolute Monocytes 262 200 - 950 cells/uL   Eosinophils Absolute 61 15 - 500 cells/uL   Basophils Absolute 61 0 - 200 cells/uL   Neutrophils Relative % 41 %   Total Lymphocyte 47.0 %   Monocytes Relative 8.2 %   Eosinophils Relative 1.9 %   Basophils Relative 1.9 %  COMPLETE METABOLIC PANEL WITH GFR     Status: None   Collection Time: 08/08/19 12:14 PM  Result Value Ref Range   Glucose, Bld 79 65 - 99 mg/dL    Comment: .            Fasting reference interval .    BUN 15 7 - 25 mg/dL   Creat 0.73 0.60 - 0.93 mg/dL    Comment: For patients >37 years of age, the reference limit for Creatinine is approximately 13% higher for people identified as African-American. .    GFR, Est Non African American 83 > OR = 60 mL/min/1.75m2   GFR, Est African American 96 > OR = 60 mL/min/1.54m2   BUN/Creatinine Ratio NOT APPLICABLE 6 - 22 (calc)   Sodium 138 135 - 146 mmol/L   Potassium 3.9 3.5 - 5.3 mmol/L   Chloride 100 98 - 110 mmol/L   CO2 30 20 -  32 mmol/L   Calcium 10.0 8.6 - 10.4 mg/dL   Total Protein 7.3 6.1 - 8.1 g/dL   Albumin 4.8 3.6 - 5.1 g/dL   Globulin 2.5 1.9 - 3.7 g/dL (calc)   AG Ratio 1.9 1.0 - 2.5 (calc)   Total Bilirubin 0.6 0.2 - 1.2 mg/dL   Alkaline phosphatase (APISO) 44 37 - 153 U/L   AST 22 10 - 35 U/L   ALT 18 6 - 29 U/L  TSH     Status: None   Collection Time: 08/08/19 12:14 PM  Result Value Ref Range   TSH 1.42 0.40 - 4.50 mIU/L  VITAMIN D 25 Hydroxy (Vit-D Deficiency, Fractures)     Status: None   Collection Time: 08/08/19 12:14 PM  Result Value Ref Range   Vit D, 25-Hydroxy 37 30 - 100 ng/mL    Comment: Vitamin D Status         25-OH Vitamin D: . Deficiency:                    <20 ng/mL Insufficiency:             20 - 29 ng/mL Optimal:                 > or = 30 ng/mL . For 25-OH Vitamin D testing on patients on  D2-supplementation and patients for whom quantitation  of D2 and D3 fractions is required, the QuestAssureD(TM) 25-OH VIT D, (D2,D3), LC/MS/MS is recommended: order  code [id number] (patients >47yrs). See Note 1 . Note 1 . For additional information, please refer to  http://education.[facility].com/faq/FAQ199  (This link is being provided for informational/ educational purposes only.)   Lipid panel     Status: None   Collection Time: 08/08/19 12:14 PM  Result Value Ref Range   Cholesterol 154 <200 mg/dL   HDL 63 > OR = 50 mg/dL   Triglycerides 68 <150 mg/dL   LDL  Cholesterol (Calc) 77 mg/dL (calc)    Comment: Reference range: <100 . Desirable range <100 mg/dL for primary prevention;   <70 mg/dL for patients with CHD or diabetic patients  with > or = 2 CHD risk factors. Marland Kitchen LDL-C is now calculated using the Martin-Hopkins  calculation, which is a validated novel method providing  better accuracy than the Friedewald equation in the  estimation of LDL-C.  Cresenciano Genre et al. Annamaria Helling. [id number]): 2061-2068  (http://education.[facility].com/faq/FAQ164)    Total CHOL/HDL  Ratio 2.4 <5.0 (calc)   Non-HDL Cholesterol (Calc) 91 <130 mg/dL (calc)    Comment: For patients with diabetes plus 1 major ASCVD risk  factor, treating to a non-HDL-C goal of <100 mg/dL  (LDL-C of <70 mg/dL) is considered a therapeutic  option.   Novel Coronavirus, NAA ([facility])     Status: None   Collection Time: 09/21/19  2:33 PM   Specimen: Nasopharyngeal(NP) swabs in vial transport medium   NASOPHARYNGE  TESTING  Result Value Ref Range   SARS-CoV-2, NAA Not Detected Not Detected    Comment: This nucleic acid amplification test was developed and its performance characteristics determined by [employer] and [employer]. Nucleic acid amplification tests include RT-PCR and TMA. This test has not been FDA cleared or approved. This test has been authorized by FDA under an Emergency Use Authorization (EUA). This test is only authorized for the duration of time the declaration that circumstances exist justifying the authorization of the emergency use of in vitro diagnostic tests for detection of SARS-CoV-2 virus and/or diagnosis of COVID-19 infection under section 564(b)(1) of the Act, 21 U.S.C. [id number]) (1), unless the authorization is terminated or revoked sooner. When diagnostic testing is negative, the possibility of a false negative result should be considered in the context of a patient's recent exposures and the presence of clinical signs and symptoms consistent with COVID-19. An individual without symptoms of COVID-19 and who is not shedding SARS-CoV-2 virus wo uld expect to have a negative (not detected) result in this assay.   SARS-COV-2, NAA 2 DAY TAT     Status: None   Collection Time: 09/21/19  2:33 PM   NASOPHARYNGE  TESTING  Result Value Ref Range   SARS-CoV-2, NAA 2 DAY TAT Performed      PHQ2/9: Depression screen [facility] 2/9 10/20/2019 09/19/2019 08/08/2019 06/06/2019 05/30/2019  Decreased Interest 0 1 0 0 0  Down, Depressed, Hopeless 0 0 0 0 0  PHQ - 2 Score 0 1 0 0 0   Altered sleeping 0 1 1 0 0  Tired, decreased energy 0 1 1 0 0  Change in appetite 0 1 0 0 0  Feeling bad or failure about yourself  0 0 0 0 0  Trouble concentrating 0 0 1 0 0  Moving slowly or fidgety/restless 0 0 0 0 0  Suicidal thoughts 0 0 0 0 0  PHQ-9 Score 0 4 3 0 0  Difficult doing work/chores Not difficult at all - - Not difficult at all Not difficult at all  Some recent data might be hidden    phq 9 is negative   Fall Risk: Fall Risk  10/20/2019 09/19/2019 08/08/2019 06/06/2019 05/30/2019  Falls in the past year? 0 0 0 0 0  Number falls in past yr: 0 0 0 0 0  Injury with Fall? 0 0 0 0 0  Follow up Falls evaluation completed - - - Falls evaluation completed    Functional Status Survey: Is the patient deaf or have difficulty hearing?:  No Does the patient have difficulty seeing, even when wearing glasses/contacts?: No Does the patient have difficulty concentrating, remembering, or making decisions?: No Does the patient have difficulty walking or climbing stairs?: No Does the patient have difficulty dressing or bathing?: No Does the patient have difficulty doing errands alone such as visiting a doctor's office or shopping?: No   Assessment & Plan  1. OSA (obstructive sleep apnea)  Reviewed results, orders already placed for cpap supplies, patient already got a phone call   2. Essential hypertension, benign  - olmesartan (BENICAR) 40 MG tablet; Take 1 tablet (40 mg total) by mouth daily. In place of lisinopril hctz  Dispense: 90 tablet; Refill: 0  3. 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   4. Rectal bleeding  - Ambulatory referral to Gastroenterology

## 2019-11-10 ENCOUNTER — Telehealth: Payer: Self-pay | Admitting: Family Medicine

## 2019-11-10 ENCOUNTER — Telehealth: Payer: Self-pay

## 2019-11-10 NOTE — Telephone Encounter (Signed)
Ashely with [employer] calling to ask for OV notes, prior to 09/05/19, stating the reason why pt was sent for sleep study.  They got the order, but no supporting ov notes. If you did not put in notes, Caryl Pina say ok to go back and do an addendum to a visit note   Fax [phone] Attn: Anglea (please note this is a better fax number for you to use)  cb [phone]

## 2019-11-13 DIAGNOSIS — H40153 Residual stage of open-angle glaucoma, bilateral: Secondary | ICD-10-CM | POA: Diagnosis not present

## 2019-11-16 ENCOUNTER — Other Ambulatory Visit: Payer: Self-pay

## 2019-11-16 ENCOUNTER — Telehealth: Payer: Self-pay

## 2019-11-16 NOTE — Telephone Encounter (Signed)
Copied from [employer] [phone]. Topic: General - Other >> Nov 16, 2019  8:20 AM Alanda Slim E wrote: Reason for CRM: Pt called to speak with the nurse / Pt has not received her Cpap machine. Medical supply stated they are not getting all the info from the office that they need/ Pt states she has called about this several times and it has been since April/ medical supply states they are getting only part of the info about the pts sleep study/ please advise  I spoke with [employer] about this. You need to create an addendum to your note so they will give her her CPAP and supplies.

## 2019-11-17 NOTE — Telephone Encounter (Signed)
Left message for [employer] to call office

## 2019-11-17 NOTE — Telephone Encounter (Signed)
error 

## 2019-11-19 ENCOUNTER — Other Ambulatory Visit: Payer: Self-pay | Admitting: Family Medicine

## 2019-11-19 DIAGNOSIS — I10 Essential (primary) hypertension: Secondary | ICD-10-CM

## 2019-11-19 NOTE — Telephone Encounter (Signed)
Requested Prescriptions  Pending Prescriptions Disp Refills  . diltiazem (CARDIZEM CD) 360 MG 24 hr capsule [Pharmacy Med Name: DILTIAZEM 24H ER(CD) 360 MG CP] 90 capsule 1    Sig: TAKE 1 CAPSULE (360 MG TOTAL) BY MOUTH DAILY.     Cardiovascular:  Calcium Channel Blockers Passed - 11/19/2019 12:55 AM      Passed - Last BP in normal range    BP Readings from Last 1 Encounters:  10/20/19 120/68         Passed - Valid encounter within last 6 months    Recent Outpatient Visits          1 month ago OSA (obstructive sleep apnea)   [facility] Steele Sizer, MD   2 months ago Anxiety and depression   [facility] Steele Sizer, MD   3 months ago Well adult exam   [facility] Steele Sizer, MD   5 months ago Vaginal odor   [facility] Steele Sizer, MD   5 months ago Vaginal odor   [place] [place]      Future Appointments            In 1 month Steele Sizer, MD [facility], [place]   In 2 months Steele Sizer, MD [facility], [facility]

## 2019-12-01 ENCOUNTER — Telehealth: Payer: Self-pay

## 2019-12-01 NOTE — Telephone Encounter (Signed)
Copied from [place] [phone]. Topic: General - Other >> Nov 30, 2019 43:60 AM Doyce Loose D wrote: Reason for CRM: Pt is calling in regards to paper work for CPAP.  PT stated that Christial Dr. Ancil Boozer nurse told the PT to call the office if she has not heard anything. PT last contact w/this issues was on 06/17 PT wants to be called on [phone].   Spoke with patient she said she does snore at night and has daytime sleepiness. She scored a 20 on the Epworth Sleepiness Scale. She states that she is so tired and sleepy throughout the night. She is scared to drive because she feels so sleepy. She said she thinks that she answered no to these questions before but it was because she was busy and trying to hurry and get off the phone.   Please addend your note before prior to ordering cpap so she can get her machine and supplies. She really needs this.

## 2019-12-25 DIAGNOSIS — G4733 Obstructive sleep apnea (adult) (pediatric): Secondary | ICD-10-CM | POA: Diagnosis not present

## 2019-12-25 DIAGNOSIS — I10 Essential (primary) hypertension: Secondary | ICD-10-CM | POA: Diagnosis not present

## 2020-01-17 ENCOUNTER — Ambulatory Visit: Payer: Medicare PPO | Admitting: Family Medicine

## 2020-01-25 DIAGNOSIS — I10 Essential (primary) hypertension: Secondary | ICD-10-CM | POA: Diagnosis not present

## 2020-01-25 DIAGNOSIS — G4733 Obstructive sleep apnea (adult) (pediatric): Secondary | ICD-10-CM | POA: Diagnosis not present

## 2020-01-25 NOTE — Progress Notes (Signed)
Name: Vickie Stewart   MRN: [id number]    DOB: January 23, 1946   Date:01/26/2020       Progress Note  Subjective  Chief Complaint  Chief Complaint  Patient presents with   Depression   Hyperlipidemia   Hypertension   Anxiety   Shortness of Breath    She continues to have sob with exertion x 1 year. She first noticed it while working out in the yard last year.    HPI  HTN: BP at home has been down  120 to low 100. Getting some dizziness, we will adjust dose of benicar from 40 mg to 20 mg daily .   Meningioma: still under the care of Dr. Michaell Cowing visit was Nov 2020. He stated on his note that since asymptomatic and size is stable and small he does not recommend surgery at this time. She has noticed some dizziness but started since she has been on cpap and benicar and bp is a little lower, we will adjust dose of benicar and see how she feels for now   GERD:she is taking pantoprazole 40 mg once a day since last visit, she has been off ibuprofen and taking celebrex prn only but feels a burning sensation from her throat to mid chest , she states ibuprofen used to help with the apin but is not longer taking it. She states symptoms are intermittent and when she eats she has indigestion. She has an appointment with gastroenterologist next week for GI bleed, advised to discuss with them   Thyroid nodule: seen by Dr. Gabriel Carina in 2015 no dysphagia,, looked at the note and she was supposed to go back for repeat US, she agrees on going to see her again   Hyperlipidemia: she takes Crestor, denies myalgias. Also taking fish oil. Last LDL was at goal , we will recheck it yearly   OSA: she has been wearing CPAP every night and is feeling better, wakes up and has to place mask back on   Neck pain: she had PT but continues to have pain, she states stiffness and has burning sensation that radiates down her shoulders, difficulty sitting for a long time and sometimes it goes down to her lower back.    No paresthesias or weakness. Denies bowel or bladder incontinence. She stopped Lyrica and states doing well, only takes celebrex prn   History of vaginal cancer, s/p brachytherapy done at [facility] , last pap smear was done 2021 and normal   SOB: she states sometimes she feels it is difficulty to breath, but not necessarily with activity, no diaphoresis , nausea or vomiting. She has noticed worsening of GERD and burning on her chest ( feels raw ). She can clean her house, do her yard without decrease in exercise tolerance.    Patient Active Problem List   Diagnosis Date Noted   Burnout of caregiver 09/19/2019   OSA (obstructive sleep apnea) 09/19/2019   Anxiety and depression 09/19/2019   Left thyroid nodule 08/08/2019   Neck pain 01/03/2019   Meningioma ([place]) 26/71/2458   Lichen sclerosus 09/98/3382   Vaginal atrophy 03/18/2016   Encounter for follow-up surveillance of vaginal cancer 03/18/2016   Eczema 10/31/2015   Left hand pain 09/10/2015   Osteopenia after menopause 07/26/2015   Menopause 07/26/2015   Irritable colon 07/26/2015   History of cancer of vagina 07/26/2015   Diffuse cystic mastopathy 07/26/2015   Obesity (BMI 30.0-34.9) 07/26/2015   Intermittent low back pain 07/26/2015   Hyperlipemia 07/26/2015  Osteoarthritis 05/13/2015   Allergic rhinitis 05/13/2015   Gastroesophageal reflux disease without esophagitis 05/13/2015   Essential hypertension, benign     Past Surgical History:  Procedure Laterality Date   BREAST EXCISIONAL BIOPSY Left    neg   BREAST LUMPECTOMY Left    COLONOSCOPY  08/26/2006   COLONOSCOPY WITH PROPOFOL N/A 10/21/2016   Procedure: COLONOSCOPY WITH PROPOFOL;  Surgeon: Christene Lye, MD;  Location: [facility] ENDOSCOPY;  Service: Endoscopy;  Laterality: N/A;    Family History  Problem Relation Age of Onset   Pulmonary embolism Mother    Hypertension Father    Aortic stenosis Father    Breast cancer Neg Hx      Social History   Tobacco Use   Smoking status: Never Smoker   Smokeless tobacco: Never Used   Tobacco comment: experimented with dip snuff as a child  Substance Use Topics   Alcohol use: No     Current Outpatient Medications:    aspirin 81 MG tablet, Take 81 mg by mouth daily. Reported on 10/10/2015, Disp: , Rfl:    cholecalciferol (VITAMIN D) 1000 units tablet, Take 1,000 Units by mouth daily., Disp: , Rfl:    desonide (DESOWEN) 0.05 % cream, Apply topically 2 (two) times daily., Disp: 60 g, Rfl: 0   diltiazem (CARDIZEM CD) 360 MG 24 hr capsule, TAKE 1 CAPSULE (360 MG TOTAL) BY MOUTH DAILY., Disp: 90 capsule, Rfl: 1   hydrALAZINE (APRESOLINE) 10 MG tablet, Take 1 tablet (10 mg total) by mouth 3 (three) times daily. If bp remains above 160/90, Disp: 30 tablet, Rfl: 0   latanoprost (XALATAN) 0.005 % ophthalmic solution, PLACE 1 DROP IN BOTH EYES AT BEDTIME, Disp: , Rfl: 4   metaxalone (SKELAXIN) 800 MG tablet, Take 1 tablet (800 mg total) by mouth 3 (three) times daily., Disp: 90 tablet, Rfl: 0   Multiple Vitamin (MULTIVITAMIN) tablet, Take 1 tablet by mouth daily. Reported on 10/10/2015, Disp: , Rfl:    Omega-3 Fatty Acids (FISH OIL CONCENTRATE PO), Take by mouth. Reported on 10/10/2015, Disp: , Rfl:    rosuvastatin (CRESTOR) 10 MG tablet, Take 1 tablet (10 mg total) by mouth daily., Disp: 90 tablet, Rfl: 3   triamcinolone cream (KENALOG) 0.1 %, APPLY TO AFFECTED AREA EXTERNALLY 2 TO 3 TIMES A DAY FOR RASH, Disp: 80 g, Rfl: 0   vitamin C (ASCORBIC ACID) 500 MG tablet, Take 500 mg by mouth daily. Reported on 10/10/2015, Disp: , Rfl:    vitamin E 400 UNIT capsule, Take 400 Units by mouth daily. Reported on 10/10/2015, Disp: , Rfl:    celecoxib (CELEBREX) 200 MG capsule, Take 1 capsule (200 mg total) by mouth daily as needed. Once daily, Disp: 90 capsule, Rfl: 0   olmesartan (BENICAR) 20 MG tablet, Take 1 tablet (20 mg total) by mouth daily. In place of lisinopril hctz,  Disp: 90 tablet, Rfl: 0   omeprazole (PRILOSEC) 20 MG capsule, Take 1 capsule (20 mg total) by mouth in the morning and at bedtime., Disp: 180 capsule, Rfl: 1  Allergies  Allergen Reactions   Atorvastatin     difficulty concentrating and focusing   Terbinafine And Related Dermatitis    Rash, itch, skin discoloration    I personally reviewed active problem list, medication list, allergies, family history, social history, health maintenance with the patient/caregiver today.   ROS  Constitutional: Negative for fever or weight change.  Respiratory: Negative for cough , positive for  shortness of breath with activity  Cardiovascular: Negative for chest pain or palpitations.  Gastrointestinal: Negative for abdominal pain, no bowel changes.  Musculoskeletal: Negative for gait problem or joint swelling.  Skin: Negative for rash.  Neurological: positive  for dizziness but no headache.  No other specific complaints in a complete review of systems (except as listed in HPI above).  Objective  Vitals:   01/26/20 1340  BP: 120/60  Pulse: (!) 55  Resp: 16  Temp: 98 F (36.7 C)  TempSrc: Oral  SpO2: 99%  Weight: 160 lb 6.4 oz (72.8 kg)  Height: 5\' 1"  (1.549 m)    Body mass index is 30.31 kg/m.  Physical Exam  Constitutional: Patient appears well-developed and well-nourished. Obese  No distress.  HEENT: head atraumatic, normocephalic, pupils equal and reactive to light,  neck supple Cardiovascular: Normal rate, regular rhythm and normal heart sounds.  No murmur heard. No BLE edema. Pulmonary/Chest: Effort normal and breath sounds normal. No respiratory distress. Abdominal: Soft.  There is no tenderness. Psychiatric: Patient has a normal mood and affect. behavior is normal. Judgment and thought content normal.   PHQ2/9: Depression screen [facility] 2/9 01/26/2020 10/20/2019 09/19/2019 08/08/2019 06/06/2019  Decreased Interest 0 0 1 0 0  Down, Depressed, Hopeless 0 0 0 0 0  PHQ - 2 Score  0 0 1 0 0  Altered sleeping 1 0 1 1 0  Tired, decreased energy 1 0 1 1 0  Change in appetite 1 0 1 0 0  Feeling bad or failure about yourself  0 0 0 0 0  Trouble concentrating 1 0 0 1 0  Moving slowly or fidgety/restless 0 0 0 0 0  Suicidal thoughts 0 0 0 0 0  PHQ-9 Score 4 0 4 3 0  Difficult doing work/chores Not difficult at all Not difficult at all - - Not difficult at all  Some recent data might be hidden    phq 9 is negative   Fall Risk: Fall Risk  01/26/2020 10/20/2019 09/19/2019 08/08/2019 06/06/2019  Falls in the past year? 0 0 0 0 0  Number falls in past yr: 0 0 0 0 0  Injury with Fall? 0 0 0 0 0  Follow up - Falls evaluation completed - - -     Functional Status Survey: Is the patient deaf or have difficulty hearing?: No Does the patient have difficulty seeing, even when wearing glasses/contacts?: No Does the patient have difficulty concentrating, remembering, or making decisions?: No Does the patient have difficulty walking or climbing stairs?: No Does the patient have difficulty dressing or bathing?: No Does the patient have difficulty doing errands alone such as visiting a doctor's office or shopping?: No    Assessment & Plan  1. Essential hypertension, benign  - olmesartan (BENICAR) 20 MG tablet; Take 1 tablet (20 mg total) by mouth daily. In place of lisinopril hctz  Dispense: 90 tablet; Refill: 0  2. Pure hypercholesterolemia   3. Gastroesophageal reflux disease without esophagitis  - omeprazole (PRILOSEC) 20 MG capsule; Take 1 capsule (20 mg total) by mouth in the morning and at bedtime.  Dispense: 180 capsule; Refill: 1 - Ambulatory referral to Gastroenterology  4. Chronic neck and back pain  - celecoxib (CELEBREX) 200 MG capsule; Take 1 capsule (200 mg total) by mouth daily as needed. Once daily  Dispense: 90 capsule; Refill: 0  5. Meningioma ([place])   6. OSA (obstructive sleep apnea)  Keep wearing CPAP   7. Left thyroid nodule  - Ambulatory  referral to  Endocrinology  8. Chronic neck pain

## 2020-01-26 ENCOUNTER — Ambulatory Visit: Payer: Medicare PPO | Admitting: Family Medicine

## 2020-01-26 ENCOUNTER — Encounter: Payer: Self-pay | Admitting: Family Medicine

## 2020-01-26 ENCOUNTER — Other Ambulatory Visit: Payer: Self-pay

## 2020-01-26 VITALS — BP 120/60 | HR 55 | Temp 98.0°F | Resp 16 | Ht 61.0 in | Wt 160.4 lb

## 2020-01-26 DIAGNOSIS — D329 Benign neoplasm of meninges, unspecified: Secondary | ICD-10-CM | POA: Diagnosis not present

## 2020-01-26 DIAGNOSIS — M542 Cervicalgia: Secondary | ICD-10-CM | POA: Diagnosis not present

## 2020-01-26 DIAGNOSIS — M549 Dorsalgia, unspecified: Secondary | ICD-10-CM

## 2020-01-26 DIAGNOSIS — G8929 Other chronic pain: Secondary | ICD-10-CM

## 2020-01-26 DIAGNOSIS — K219 Gastro-esophageal reflux disease without esophagitis: Secondary | ICD-10-CM

## 2020-01-26 DIAGNOSIS — E7800 Pure hypercholesterolemia, unspecified: Secondary | ICD-10-CM

## 2020-01-26 DIAGNOSIS — I10 Essential (primary) hypertension: Secondary | ICD-10-CM | POA: Diagnosis not present

## 2020-01-26 DIAGNOSIS — E041 Nontoxic single thyroid nodule: Secondary | ICD-10-CM

## 2020-01-26 DIAGNOSIS — G4733 Obstructive sleep apnea (adult) (pediatric): Secondary | ICD-10-CM | POA: Diagnosis not present

## 2020-01-26 MED ORDER — CELECOXIB 200 MG PO CAPS: 200.0000 mg | ORAL_CAPSULE | Freq: Every day | ORAL | 0 refills | Status: DC | PRN

## 2020-01-26 MED ORDER — OMEPRAZOLE 20 MG PO CPDR: 20.0000 mg | DELAYED_RELEASE_CAPSULE | Freq: Two times a day (BID) | ORAL | 1 refills | Status: DC

## 2020-01-26 MED ORDER — OLMESARTAN MEDOXOMIL 20 MG PO TABS: 20.0000 mg | ORAL_TABLET | Freq: Every day | ORAL | 0 refills | Status: DC

## 2020-01-30 ENCOUNTER — Encounter: Payer: Self-pay | Admitting: Gastroenterology

## 2020-01-30 ENCOUNTER — Other Ambulatory Visit: Payer: Self-pay

## 2020-01-30 ENCOUNTER — Ambulatory Visit: Payer: Medicare PPO | Admitting: Gastroenterology

## 2020-01-30 VITALS — BP 157/70 | HR 52 | Temp 98.1°F | Ht 61.0 in | Wt 161.2 lb

## 2020-01-30 DIAGNOSIS — K219 Gastro-esophageal reflux disease without esophagitis: Secondary | ICD-10-CM

## 2020-01-30 DIAGNOSIS — K625 Hemorrhage of anus and rectum: Secondary | ICD-10-CM

## 2020-01-30 NOTE — Patient Instructions (Signed)
High-Fiber Diet Fiber, also called dietary fiber, is a type of carbohydrate that is found in fruits, vegetables, whole grains, and beans. A high-fiber diet can have many health benefits. Your health care provider may recommend a high-fiber diet to help:  Prevent constipation. Fiber can make your bowel movements more regular.  Lower your cholesterol.  Relieve the following conditions: ? Swelling of veins in the anus (hemorrhoids). ? Swelling and irritation (inflammation) of specific areas of the digestive tract (uncomplicated diverticulosis). ? A problem of the large intestine (colon) that sometimes causes pain and diarrhea (irritable bowel syndrome, IBS).  Prevent overeating as part of a weight-loss plan.  Prevent heart disease, type 2 diabetes, and certain cancers. What is my plan? The recommended daily fiber intake in grams (g) includes:  38 g for men age 50 or younger.  30 g for men over age 50.  25 g for women age 50 or younger.  21 g for women over age 50. You can get the recommended daily intake of dietary fiber by:  Eating a variety of fruits, vegetables, grains, and beans.  Taking a fiber supplement, if it is not possible to get enough fiber through your diet. What do I need to know about a high-fiber diet?  It is better to get fiber through food sources rather than from fiber supplements. There is not a lot of research about how effective supplements are.  Always check the fiber content on the nutrition facts label of any prepackaged food. Look for foods that contain 5 g of fiber or more per serving.  Talk with a diet and nutrition specialist (dietitian) if you have questions about specific foods that are recommended or not recommended for your medical condition, especially if those foods are not listed below.  Gradually increase how much fiber you consume. If you increase your intake of dietary fiber too quickly, you may have bloating, cramping, or gas.  Drink plenty  of water. Water helps you to digest fiber. What are tips for following this plan?  Eat a wide variety of high-fiber foods.  Make sure that half of the grains that you eat each day are whole grains.  Eat breads and cereals that are made with whole-grain flour instead of refined flour or white flour.  Eat brown rice, bulgur wheat, or millet instead of white rice.  Start the day with a breakfast that is high in fiber, such as a cereal that contains 5 g of fiber or more per serving.  Use beans in place of meat in soups, salads, and pasta dishes.  Eat high-fiber snacks, such as berries, raw vegetables, nuts, and popcorn.  Choose whole fruits and vegetables instead of processed forms like juice or sauce. What foods can I eat?  Fruits Berries. Pears. Apples. Oranges. Avocado. Prunes and raisins. Dried figs. Vegetables Sweet potatoes. Spinach. Kale. Artichokes. Cabbage. Broccoli. Cauliflower. Green peas. Carrots. Squash. Grains Whole-grain breads. Multigrain cereal. Oats and oatmeal. Brown rice. Barley. Bulgur wheat. Millet. Quinoa. Bran muffins. Popcorn. Rye wafer crackers. Meats and other proteins Navy, kidney, and pinto beans. Soybeans. Split peas. Lentils. Nuts and seeds. Dairy Fiber-fortified yogurt. Beverages Fiber-fortified soy milk. Fiber-fortified orange juice. Other foods Fiber bars. The items listed above may not be a complete list of recommended foods and beverages. Contact a dietitian for more options. What foods are not recommended? Fruits Fruit juice. Cooked, strained fruit. Vegetables Fried potatoes. Canned vegetables. Well-cooked vegetables. Grains White bread. Pasta made with refined flour. White rice. Meats and other   proteins Fatty cuts of meat. Fried chicken or fried fish. Dairy Milk. Yogurt. Cream cheese. Sour cream. Fats and oils Butters. Beverages Soft drinks. Other foods Cakes and pastries. The items listed above may not be a complete list of foods  and beverages to avoid. Contact a dietitian for more information. Summary  Fiber is a type of carbohydrate. It is found in fruits, vegetables, whole grains, and beans.  There are many health benefits of eating a high-fiber diet, such as preventing constipation, lowering blood cholesterol, helping with weight loss, and reducing your risk of heart disease, diabetes, and certain cancers.  Gradually increase your intake of fiber. Increasing too fast can result in cramping, bloating, and gas. Drink plenty of water while you increase your fiber.  The best sources of fiber include whole fruits and vegetables, whole grains, nuts, seeds, and beans. This information is not intended to replace advice given to you by your health care provider. Make sure you discuss any questions you have with your health care provider. Document Revised: 03/22/2017 Document Reviewed: 03/22/2017 Elsevier Patient Education  2020 [employer] Inc. Gastroesophageal Reflux Disease, Adult Gastroesophageal reflux (GER) happens when acid from the stomach flows up into the tube that connects the mouth and the stomach (esophagus). Normally, food travels down the esophagus and stays in the stomach to be digested. With GER, food and stomach acid sometimes move back up into the esophagus. You may have a disease called gastroesophageal reflux disease (GERD) if the reflux:  Happens often.  Causes frequent or very bad symptoms.  Causes problems such as damage to the esophagus. When this happens, the esophagus becomes sore and swollen (inflamed). Over time, GERD can make small holes (ulcers) in the lining of the esophagus. What are the causes? This condition is caused by a problem with the muscle between the esophagus and the stomach. When this muscle is weak or not normal, it does not close properly to keep food and acid from coming back up from the stomach. The muscle can be weak because of:  Tobacco use.  Pregnancy.  Having a certain  type of hernia (hiatal hernia).  Alcohol use.  Certain foods and drinks, such as coffee, chocolate, onions, and peppermint. What increases the risk? You are more likely to develop this condition if you:  Are overweight.  Have a disease that affects your connective tissue.  Use NSAID medicines. What are the signs or symptoms? Symptoms of this condition include:  Heartburn.  Difficult or painful swallowing.  The feeling of having a lump in the throat.  A bitter taste in the mouth.  Bad breath.  Having a lot of saliva.  Having an upset or bloated stomach.  Belching.  Chest pain. Different conditions can cause chest pain. Make sure you see your doctor if you have chest pain.  Shortness of breath or noisy breathing (wheezing).  Ongoing (chronic) cough or a cough at night.  Wearing away of the surface of teeth (tooth enamel).  Weight loss. How is this treated? Treatment will depend on how bad your symptoms are. Your doctor may suggest:  Changes to your diet.  Medicine.  Surgery. Follow these instructions at home: Eating and drinking   Follow a diet as told by your doctor. You may need to avoid foods and drinks such as: ? Coffee and tea (with or without caffeine). ? Drinks that contain alcohol. ? Energy drinks and sports drinks. ? Bubbly (carbonated) drinks or sodas. ? Chocolate and cocoa. ? Peppermint and mint flavorings. ?   Garlic and onions. ? Horseradish. ? Spicy and acidic foods. These include peppers, chili powder, curry powder, vinegar, hot sauces, and BBQ sauce. ? Citrus fruit juices and citrus fruits, such as oranges, lemons, and limes. ? Tomato-based foods. These include red sauce, chili, salsa, and pizza with red sauce. ? Fried and fatty foods. These include donuts, french fries, potato chips, and high-fat dressings. ? High-fat meats. These include hot dogs, rib eye steak, sausage, ham, and bacon. ? High-fat dairy items, such as whole milk,  butter, and cream cheese.  Eat small meals often. Avoid eating large meals.  Avoid drinking large amounts of liquid with your meals.  Avoid eating meals during the 2-3 hours before bedtime.  Avoid lying down right after you eat.  Do not exercise right after you eat. Lifestyle   Do not use any products that contain nicotine or tobacco. These include cigarettes, e-cigarettes, and chewing tobacco. If you need help quitting, ask your doctor.  Try to lower your stress. If you need help doing this, ask your doctor.  If you are overweight, lose an amount of weight that is healthy for you. Ask your doctor about a safe weight loss goal. General instructions  Pay attention to any changes in your symptoms.  Take over-the-counter and prescription medicines only as told by your doctor. Do not take aspirin, ibuprofen, or other NSAIDs unless your doctor says it is okay.  Wear loose clothes. Do not wear anything tight around your waist.  Raise (elevate) the head of your bed about 6 inches (15 cm).  Avoid bending over if this makes your symptoms worse.  Keep all follow-up visits as told by your doctor. This is important. Contact a doctor if:  You have new symptoms.  You lose weight and you do not know why.  You have trouble swallowing or it hurts to swallow.  You have wheezing or a cough that keeps happening.  Your symptoms do not get better with treatment.  You have a hoarse voice. Get help right away if:  You have pain in your arms, neck, jaw, teeth, or back.  You feel sweaty, dizzy, or light-headed.  You have chest pain or shortness of breath.  You throw up (vomit) and your throw-up looks like blood or coffee grounds.  You pass out (faint).  Your poop (stool) is bloody or black.  You cannot swallow, drink, or eat. Summary  If a person has gastroesophageal reflux disease (GERD), food and stomach acid move back up into the esophagus and cause symptoms or problems such as  damage to the esophagus.  Treatment will depend on how bad your symptoms are.  Follow a diet as told by your doctor.  Take all medicines only as told by your doctor. This information is not intended to replace advice given to you by your health care provider. Make sure you discuss any questions you have with your health care provider. Document Revised: 11/24/2017 Document Reviewed: 11/24/2017 Elsevier Patient Education  2020 [employer] Inc.  

## 2020-01-30 NOTE — Progress Notes (Signed)
Cephas Darby, MD [address]  [place]  [place], [place] [address]  Main: [phone]  Fax: [phone]    Gastroenterology Consultation  Referring Provider:     Steele Sizer, MD Primary Care Physician:  Steele Sizer, MD Primary Gastroenterologist:  Dr. Cephas Darby Reason for Consultation:     Rectal bleeding, heartburn        HPI:   Vickie Stewart is a 74 y.o. female referred by Dr. Steele Sizer, MD  for consultation & management of rectal bleeding and heartburn  Heartburn: Patient reports chronic, several years history of burning in her throat, her throat feels raw, constant clearing of throat. She was on omeprazole 40 mg once daily, switched to 20 mg two times a day to see if it helps any better. She reports modest relief in her symptoms. She denies difficulty swallowing. She does drink 2 cups of coffee daily, and late dinners usually. She does drink Pepsi few times a week she was going through a lot of stress taking care of her dad who was 70 year old until recently. She does not have good sleep. She has been gaining weight.  Rectal bleeding: Bright red blood per rectum, hemoglobin only uses intermittent rectal associated with straining. She reports that her stools, sometimes hard to benign when she eats. She had a colonoscopy in 2008 as well as 2018 which revealed hemorrhoids and diverticulosis only She does take Citrucel daily  She does not smoke or drink alcohol  NSAIDs: None  Antiplts/Anticoagulants/Anti thrombotics: None  GI Procedures: Colonoscopy in 2008 and 2018 revealed diverticulosis and internal hemorrhoids She underwent upper endoscopy 07/2006 for reflux, which was normal  Past Medical History:  Diagnosis Date  . Allergic rhinitis, cause unspecified   . Anxiety state, unspecified   . Contact dermatitis and other eczema, due to unspecified cause   . Diffuse cystic mastopathy   . Dyspepsia and other specified disorders of function  of stomach   . Essential hypertension, benign   . Glaucoma   . Irritable bowel syndrome   . Lateral epicondylitis  of elbow   . Leukocytopenia, unspecified   . Mastodynia   . Obesity, unspecified   . Pure hypercholesterolemia   . Reflux esophagitis   . Thoracic or lumbosacral neuritis or radiculitis, unspecified   . Unspecified disorder of skin and subcutaneous tissue   . Vaginal cancer [facility])    History    Past Surgical History:  Procedure Laterality Date  . BREAST EXCISIONAL BIOPSY Left    neg  . BREAST LUMPECTOMY Left   . COLONOSCOPY  08/26/2006  . COLONOSCOPY WITH PROPOFOL N/A 10/21/2016   Procedure: COLONOSCOPY WITH PROPOFOL;  Surgeon: Christene Lye, MD;  Location: [facility] ENDOSCOPY;  Service: Endoscopy;  Laterality: N/A;    Current Outpatient Medications:  .  aspirin 81 MG tablet, Take 81 mg by mouth daily. Reported on 10/10/2015, Disp: , Rfl:  .  celecoxib (CELEBREX) 200 MG capsule, Take 1 capsule (200 mg total) by mouth daily as needed. Once daily, Disp: 90 capsule, Rfl: 0 .  cholecalciferol (VITAMIN D) 1000 units tablet, Take 1,000 Units by mouth daily., Disp: , Rfl:  .  desonide (DESOWEN) 0.05 % cream, Apply topically 2 (two) times daily., Disp: 60 g, Rfl: 0 .  diltiazem (CARDIZEM CD) 360 MG 24 hr capsule, TAKE 1 CAPSULE (360 MG TOTAL) BY MOUTH DAILY., Disp: 90 capsule, Rfl: 1 .  hydrALAZINE (APRESOLINE) 10 MG tablet, Take 1 tablet (10 mg total) by mouth  3 (three) times daily. If bp remains above 160/90, Disp: 30 tablet, Rfl: 0 .  latanoprost (XALATAN) 0.005 % ophthalmic solution, PLACE 1 DROP IN BOTH EYES AT BEDTIME, Disp: , Rfl: 4 .  metaxalone (SKELAXIN) 800 MG tablet, Take 1 tablet (800 mg total) by mouth 3 (three) times daily., Disp: 90 tablet, Rfl: 0 .  Multiple Vitamin (MULTIVITAMIN) tablet, Take 1 tablet by mouth daily. Reported on 10/10/2015, Disp: , Rfl:  .  olmesartan (BENICAR) 20 MG tablet, Take 1 tablet (20 mg total) by mouth daily. In place of lisinopril  hctz, Disp: 90 tablet, Rfl: 0 .  Omega-3 Fatty Acids (FISH OIL CONCENTRATE PO), Take by mouth. Reported on 10/10/2015, Disp: , Rfl:  .  omeprazole (PRILOSEC) 20 MG capsule, Take 1 capsule (20 mg total) by mouth in the morning and at bedtime., Disp: 180 capsule, Rfl: 1 .  rosuvastatin (CRESTOR) 10 MG tablet, Take 1 tablet (10 mg total) by mouth daily., Disp: 90 tablet, Rfl: 3 .  triamcinolone cream (KENALOG) 0.1 %, APPLY TO AFFECTED AREA EXTERNALLY 2 TO 3 TIMES A DAY FOR RASH, Disp: 80 g, Rfl: 0 .  vitamin C (ASCORBIC ACID) 500 MG tablet, Take 500 mg by mouth daily. Reported on 10/10/2015, Disp: , Rfl:  .  vitamin E 400 UNIT capsule, Take 400 Units by mouth daily. Reported on 10/10/2015, Disp: , Rfl:    Family History  Problem Relation Age of Onset  . Pulmonary embolism Mother   . Hypertension Father   . Aortic stenosis Father   . Breast cancer Neg Hx      Social History   Tobacco Use  . Smoking status: Never Smoker  . Smokeless tobacco: Never Used  . Tobacco comment: experimented with dip snuff as a child  Vaping Use  . Vaping Use: Never used  Substance Use Topics  . Alcohol use: No  . Drug use: No    Allergies as of 01/30/2020 - Review Complete 01/30/2020  Allergen Reaction Noted  . Atorvastatin  07/26/2015  . Terbinafine and related Dermatitis 01/23/2016    Review of Systems:    All systems reviewed and negative except where noted in HPI.   Physical Exam:  BP (!) 157/70 (BP Location: Left Arm, Patient Position: Sitting, Cuff Size: Normal)   Pulse (!) 52   Temp 98.1 F (36.7 C) (Oral)   Ht 5\' 1"  (1.549 m)   Wt 161 lb 4 oz (73.1 kg)   BMI 30.47 kg/m  No LMP recorded. Patient is postmenopausal.  General:   Alert,  Well-developed, well-nourished, pleasant and cooperative in NAD Head:  Normocephalic and atraumatic. Eyes:  Sclera clear, no icterus.   Conjunctiva pink. Ears:  Normal auditory acuity. Nose:  No deformity, discharge, or lesions. Mouth:  No deformity or  lesions,oropharynx pink & moist. Neck:  Supple; no masses or thyromegaly. Lungs:  Respirations even and unlabored.  Clear throughout to auscultation.   No wheezes, crackles, or rhonchi. No acute distress. Heart:  Regular rate and rhythm; no murmurs, clicks, rubs, or gallops. Abdomen:  Normal bowel sounds. Soft, non-tender and non-distended without masses, hepatosplenomegaly or hernias noted.  No guarding or rebound tenderness.   Rectal: Not performed Msk:  Symmetrical without gross deformities. Good, equal movement & strength bilaterally. Pulses:  Normal pulses noted. Extremities:  No clubbing or edema.  No cyanosis. Neurologic:  Alert and oriented x3;  grossly normal neurologically. Skin:  Intact without significant lesions or rashes. No jaundice. Psych:  Alert and cooperative. Normal  mood and affect.  Imaging Studies: No abdominal imaging  Assessment and Plan:   Vickie Stewart is a 74 y.o. female with overweight is seen in consultation for chronic GERD and bright red blood per rectum  Chronic GERD Discussed about antireflux lifestyle, information provided Continue omeprazole 20 mg daily before meals Recommend EGD for further evaluation, given her age Avoid sodas, cut back on red meat and coffee  Rectal blood per rectum, most likely hemorrhoidal bleeding secondary to straining No evidence of anemia Discussed about high-fiber diet, information provided Adequate intake of water Defer hemorrhoid ligation unless symptoms are persistent   Follow up in 3 months   Cephas Darby, MD

## 2020-02-02 ENCOUNTER — Other Ambulatory Visit
Admission: RE | Admit: 2020-02-02 | Discharge: 2020-02-02 | Disposition: A | Discharge status: Home / Self Care | Payer: Medicare PPO | Source: Ambulatory Visit | Attending: Gastroenterology | Admitting: Gastroenterology

## 2020-02-02 ENCOUNTER — Other Ambulatory Visit: Payer: Self-pay

## 2020-02-02 DIAGNOSIS — Z20822 Contact with and (suspected) exposure to covid-19: Secondary | ICD-10-CM | POA: Diagnosis not present

## 2020-02-02 DIAGNOSIS — Z01812 Encounter for preprocedural laboratory examination: Secondary | ICD-10-CM | POA: Insufficient documentation

## 2020-02-02 LAB — SARS CORONAVIRUS 2 (TAT 6-24 HRS): SARS Coronavirus 2: NEGATIVE

## 2020-02-07 ENCOUNTER — Other Ambulatory Visit: Payer: Self-pay

## 2020-02-07 ENCOUNTER — Ambulatory Visit: Payer: Medicare PPO | Admitting: Certified Registered Nurse Anesthetist

## 2020-02-07 ENCOUNTER — Encounter: Payer: Self-pay | Admitting: Gastroenterology

## 2020-02-07 ENCOUNTER — Ambulatory Visit
Admission: RE | Admit: 2020-02-07 | Discharge: 2020-02-07 | Disposition: A | Discharge status: Home / Self Care | Payer: Medicare PPO | Attending: Gastroenterology | Admitting: Gastroenterology

## 2020-02-07 ENCOUNTER — Encounter
Admission: RE | Disposition: A | Discharge status: Home / Self Care | Payer: Self-pay | Source: Home / Self Care | Attending: Gastroenterology

## 2020-02-07 DIAGNOSIS — Z8589 Personal history of malignant neoplasm of other organs and systems: Secondary | ICD-10-CM | POA: Diagnosis not present

## 2020-02-07 DIAGNOSIS — M940 Chondrocostal junction syndrome [Tietze]: Secondary | ICD-10-CM | POA: Insufficient documentation

## 2020-02-07 DIAGNOSIS — N6019 Diffuse cystic mastopathy of unspecified breast: Secondary | ICD-10-CM | POA: Diagnosis not present

## 2020-02-07 DIAGNOSIS — Z7982 Long term (current) use of aspirin: Secondary | ICD-10-CM | POA: Insufficient documentation

## 2020-02-07 DIAGNOSIS — Z6830 Body mass index (BMI) 30.0-30.9, adult: Secondary | ICD-10-CM | POA: Diagnosis not present

## 2020-02-07 DIAGNOSIS — E669 Obesity, unspecified: Secondary | ICD-10-CM | POA: Insufficient documentation

## 2020-02-07 DIAGNOSIS — Z791 Long term (current) use of non-steroidal anti-inflammatories (NSAID): Secondary | ICD-10-CM | POA: Insufficient documentation

## 2020-02-07 DIAGNOSIS — E7800 Pure hypercholesterolemia, unspecified: Secondary | ICD-10-CM | POA: Insufficient documentation

## 2020-02-07 DIAGNOSIS — F419 Anxiety disorder, unspecified: Secondary | ICD-10-CM | POA: Diagnosis not present

## 2020-02-07 DIAGNOSIS — M7710 Lateral epicondylitis, unspecified elbow: Secondary | ICD-10-CM | POA: Insufficient documentation

## 2020-02-07 DIAGNOSIS — K219 Gastro-esophageal reflux disease without esophagitis: Secondary | ICD-10-CM | POA: Diagnosis not present

## 2020-02-07 DIAGNOSIS — H409 Unspecified glaucoma: Secondary | ICD-10-CM | POA: Insufficient documentation

## 2020-02-07 DIAGNOSIS — Z8249 Family history of ischemic heart disease and other diseases of the circulatory system: Secondary | ICD-10-CM | POA: Diagnosis not present

## 2020-02-07 DIAGNOSIS — G4730 Sleep apnea, unspecified: Secondary | ICD-10-CM | POA: Insufficient documentation

## 2020-02-07 DIAGNOSIS — I10 Essential (primary) hypertension: Secondary | ICD-10-CM | POA: Diagnosis not present

## 2020-02-07 DIAGNOSIS — M5417 Radiculopathy, lumbosacral region: Secondary | ICD-10-CM | POA: Insufficient documentation

## 2020-02-07 DIAGNOSIS — K589 Irritable bowel syndrome without diarrhea: Secondary | ICD-10-CM | POA: Diagnosis not present

## 2020-02-07 DIAGNOSIS — Z888 Allergy status to other drugs, medicaments and biological substances status: Secondary | ICD-10-CM | POA: Diagnosis not present

## 2020-02-07 DIAGNOSIS — F329 Major depressive disorder, single episode, unspecified: Secondary | ICD-10-CM | POA: Insufficient documentation

## 2020-02-07 DIAGNOSIS — N644 Mastodynia: Secondary | ICD-10-CM | POA: Diagnosis not present

## 2020-02-07 HISTORY — PX: ESOPHAGOGASTRODUODENOSCOPY (EGD) WITH PROPOFOL: SHX5813

## 2020-02-07 SURGERY — ESOPHAGOGASTRODUODENOSCOPY (EGD) WITH PROPOFOL
Anesthesia: General

## 2020-02-07 MED ORDER — LIDOCAINE HCL (PF) 2 % IJ SOLN
INTRAMUSCULAR | Status: AC
  Filled 2020-02-07: qty 5

## 2020-02-07 MED ORDER — GLYCOPYRROLATE 0.2 MG/ML IJ SOLN
INTRAMUSCULAR | Status: DC | PRN
  Administered 2020-02-07: .2 mg via INTRAVENOUS

## 2020-02-07 MED ORDER — PROPOFOL 10 MG/ML IV BOLUS
INTRAVENOUS | Status: DC | PRN
  Administered 2020-02-07: 70 mg via INTRAVENOUS

## 2020-02-07 MED ORDER — SODIUM CHLORIDE 0.9 % IV SOLN
INTRAVENOUS | Status: DC
  Administered 2020-02-07: 20 mL/h via INTRAVENOUS

## 2020-02-07 MED ORDER — LIDOCAINE HCL (CARDIAC) PF 100 MG/5ML IV SOSY
PREFILLED_SYRINGE | INTRAVENOUS | Status: DC | PRN
  Administered 2020-02-07: 50 mg via INTRAVENOUS

## 2020-02-07 MED ORDER — PROPOFOL 500 MG/50ML IV EMUL
INTRAVENOUS | Status: DC | PRN
  Administered 2020-02-07: 150 ug/kg/min via INTRAVENOUS

## 2020-02-07 MED ORDER — PROPOFOL 500 MG/50ML IV EMUL
INTRAVENOUS | Status: AC
  Filled 2020-02-07: qty 50

## 2020-02-07 MED ORDER — PHENYLEPHRINE HCL (PRESSORS) 10 MG/ML IV SOLN
INTRAVENOUS | Status: AC
  Filled 2020-02-07: qty 1

## 2020-02-07 NOTE — H&P (Signed)
Cephas Darby, MD [address]  [place]  [place], [place] [address]  Main: [phone]  Fax: [phone] Pager: [phone]  Primary Care Physician:  Steele Sizer, MD Primary Gastroenterologist:  Dr. Cephas Darby  Pre-Procedure History & Physical: HPI:  Vickie Stewart is a 74 y.o. female is here for an endoscopy.   Past Medical History:  Diagnosis Date  . Allergic rhinitis, cause unspecified   . Anxiety state, unspecified   . Contact dermatitis and other eczema, due to unspecified cause   . Diffuse cystic mastopathy   . Dyspepsia and other specified disorders of function of stomach   . Essential hypertension, benign   . Glaucoma   . Irritable bowel syndrome   . Lateral epicondylitis  of elbow   . Leukocytopenia, unspecified   . Mastodynia   . Obesity, unspecified   . Pure hypercholesterolemia   . Reflux esophagitis   . Thoracic or lumbosacral neuritis or radiculitis, unspecified   . Unspecified disorder of skin and subcutaneous tissue   . Vaginal cancer [facility])    History    Past Surgical History:  Procedure Laterality Date  . BREAST EXCISIONAL BIOPSY Left    neg  . BREAST LUMPECTOMY Left   . COLONOSCOPY  08/26/2006  . COLONOSCOPY WITH PROPOFOL N/A 10/21/2016   Procedure: COLONOSCOPY WITH PROPOFOL;  Surgeon: Christene Lye, MD;  Location: [facility] ENDOSCOPY;  Service: Endoscopy;  Laterality: N/A;    Prior to Admission medications   Medication Sig Start Date End Date Taking? Authorizing Provider  aspirin 81 MG tablet Take 81 mg by mouth daily. Reported on 10/10/2015   Yes [provider]  celecoxib (CELEBREX) 200 MG capsule Take 1 capsule (200 mg total) by mouth daily as needed. Once daily 01/26/20  Yes Sowles, Drue Stager, MD  cholecalciferol (VITAMIN D) 1000 units tablet Take 1,000 Units by mouth daily.   Yes [provider]  desonide (DESOWEN) 0.05 % cream Apply topically 2 (two) times daily. 12/08/17  Yes Sowles, Drue Stager, MD    diltiazem (CARDIZEM CD) 360 MG 24 hr capsule TAKE 1 CAPSULE (360 MG TOTAL) BY MOUTH DAILY. 11/19/19  Yes Sowles, Drue Stager, MD  hydrALAZINE (APRESOLINE) 10 MG tablet Take 1 tablet (10 mg total) by mouth 3 (three) times daily. If bp remains above 160/90 02/01/19  Yes Sowles, Drue Stager, MD  latanoprost (XALATAN) 0.005 % ophthalmic solution PLACE 1 DROP IN BOTH EYES AT BEDTIME 06/20/17  Yes [provider]  metaxalone (SKELAXIN) 800 MG tablet Take 1 tablet (800 mg total) by mouth 3 (three) times daily. 09/19/19  Yes Sowles, Drue Stager, MD  Multiple Vitamin (MULTIVITAMIN) tablet Take 1 tablet by mouth daily. Reported on 10/10/2015   Yes [provider]  olmesartan (BENICAR) 20 MG tablet Take 1 tablet (20 mg total) by mouth daily. In place of lisinopril hctz 01/26/20  Yes Sowles, Drue Stager, MD  Omega-3 Fatty Acids (FISH OIL CONCENTRATE PO) Take by mouth. Reported on 10/10/2015   Yes [provider]  omeprazole (PRILOSEC) 20 MG capsule Take 1 capsule (20 mg total) by mouth in the morning and at bedtime. 01/26/20  Yes Sowles, Drue Stager, MD  rosuvastatin (CRESTOR) 10 MG tablet Take 1 tablet (10 mg total) by mouth daily. 08/08/19  Yes Sowles, Drue Stager, MD  triamcinolone cream (KENALOG) 0.1 % APPLY TO AFFECTED AREA EXTERNALLY 2 TO 3 TIMES A DAY FOR RASH 07/16/19  Yes Sowles, Drue Stager, MD  vitamin C (ASCORBIC ACID) 500 MG tablet Take 500 mg by mouth daily. Reported on  10/10/2015   Yes [provider]  vitamin E 400 UNIT capsule Take 400 Units by mouth daily. Reported on 10/10/2015   Yes [provider]    Allergies as of 01/30/2020 - Review Complete 01/30/2020  Allergen Reaction Noted  . Atorvastatin  07/26/2015  . Terbinafine and related Dermatitis 01/23/2016    Family History  Problem Relation Age of Onset  . Pulmonary embolism Mother   . Hypertension Father   . Aortic stenosis Father   . Breast cancer Neg Hx     Social History   Socioeconomic History  . Marital status:  Widowed    Spouse name: Not on file  . Number of children: 0  . Years of education: Not on file  . Highest education level: Some college, no degree  Occupational History  . Occupation: Radio producer: [employer]  Tobacco Use  . Smoking status: Never Smoker  . Smokeless tobacco: Never Used  . Tobacco comment: experimented with dip snuff as a child  Vaping Use  . Vaping Use: Never used  Substance and Sexual Activity  . Alcohol use: No  . Drug use: No  . Sexual activity: Not Currently    Birth control/protection: Post-menopausal  Other Topics Concern  . Not on file  Social History Narrative  . Not on file   Social Determinants of Health   Financial Resource Strain: Low Risk   . Difficulty of Paying Living Expenses: Not hard at all  Food Insecurity: No Food Insecurity  . Worried About Charity fundraiser in the Last Year: Never true  . Ran Out of Food in the Last Year: Never true  Transportation Needs: No Transportation Needs  . Lack of Transportation (Medical): No  . Lack of Transportation (Non-Medical): No  Physical Activity: Insufficiently Active  . Days of Exercise per Week: 7 days  . Minutes of Exercise per Session: 20 min  Stress: No Stress Concern Present  . Feeling of Stress : Only a little  Social Connections: Moderately Isolated  . Frequency of Communication with Friends and Family: Twice a week  . Frequency of Social Gatherings with Friends and Family: Never  . Attends Religious Services: 1 to 4 times per year  . Active Member of Clubs or Organizations: Yes  . Attends Archivist Meetings: Never  . Marital Status: Widowed  Intimate Partner Violence: Not At Risk  . Fear of Current or Ex-Partner: No  . Emotionally Abused: No  . Physically Abused: No  . Sexually Abused: No    Review of Systems: See HPI, otherwise negative ROS  Physical Exam: BP 139/63   Pulse (!) 50   Temp (!) 96.2 F (35.7 C) (Temporal)   Resp 20   Ht 5\' 1"  (1.549 m)   Wt  72.6 kg   SpO2 100%   BMI 30.23 kg/m  General:   Alert,  pleasant and cooperative in NAD Head:  Normocephalic and atraumatic. Neck:  Supple; no masses or thyromegaly. Lungs:  Clear throughout to auscultation.    Heart:  Regular rate and rhythm. Abdomen:  Soft, nontender and nondistended. Normal bowel sounds, without guarding, and without rebound.   Neurologic:  Alert and  oriented x4;  grossly normal neurologically.  Impression/Plan: Vickie Stewart is here for an endoscopy to be performed for chronic gerd  Risks, benefits, limitations, and alternatives regarding  endoscopy have been reviewed with the patient.  Questions have been answered.  All parties agreeable.    ,  MD  02/07/2020, 8:17 AM

## 2020-02-07 NOTE — Anesthesia Procedure Notes (Signed)
Date/Time: 02/07/2020 8:19 AM Performed by: Johnna Acosta, CRNA Pre-anesthesia Checklist: Patient identified, Emergency Drugs available, Suction available, Patient being monitored and Timeout performed Patient Re-evaluated:Patient Re-evaluated prior to induction Oxygen Delivery Method: Simple face mask Preoxygenation: Pre-oxygenation with 100% oxygen Induction Type: IV induction

## 2020-02-07 NOTE — Transfer of Care (Signed)
Immediate Anesthesia Transfer of Care Note  Patient: Vickie Stewart  Procedure(s) Performed: ESOPHAGOGASTRODUODENOSCOPY (EGD) WITH PROPOFOL (N/A )  Patient Location: PACU  Anesthesia Type:General  Level of Consciousness: awake, alert  and oriented  Airway & Oxygen Therapy: Patient Spontanous Breathing and Patient connected to face mask oxygen  Post-op Assessment: Report given to RN and Post -op Vital signs reviewed and stable  Post vital signs: Reviewed and stable  Last Vitals:  Vitals Value Taken Time  BP 104/49 02/07/20 0839  Temp 36.6 C 02/07/20 0836  Pulse 59 02/07/20 0839  Resp 20 02/07/20 0839  SpO2 100 % 02/07/20 0839  Vitals shown include unvalidated device data.  Last Pain:  Vitals:   02/07/20 0836  TempSrc: Tympanic  PainSc: Asleep         Complications: No complications documented.

## 2020-02-07 NOTE — Op Note (Signed)
[facility] Gastroenterology Patient Name: Vickie Stewart Procedure Date: 02/07/2020 8:17 AM MRN: [id number] Account #: [id number] Date of Birth: 24-Sep-1945 Admit Type: Outpatient Age: 74 Room: [facility] ENDO ROOM 4 Gender: Female Note Status: Finalized Procedure:             Upper GI endoscopy Indications:           Follow-up of gastro-esophageal reflux disease Providers:             Lin Landsman MD, MD Medicines:             Monitored Anesthesia Care Complications:         No immediate complications. Estimated blood loss: None. Procedure:             Pre-Anesthesia Assessment:                        - Prior to the procedure, a History and Physical was                         performed, and patient medications and allergies were                         reviewed. The patient is competent. The risks and                         benefits of the procedure and the sedation options and                         risks were discussed with the patient. All questions                         were answered and informed consent was obtained.                         Patient identification and proposed procedure were                         verified by the physician, the nurse, the                         anesthesiologist, the anesthetist and the technician                         in the pre-procedure area in the procedure room in the                         endoscopy suite. Mental Status Examination: alert and                         oriented. Airway Examination: normal oropharyngeal                         airway and neck mobility. Respiratory Examination:                         clear to auscultation. CV Examination: normal.                         Prophylactic Antibiotics: The patient  does not require                         prophylactic antibiotics. Prior Anticoagulants: The                         patient has taken no previous anticoagulant or                          antiplatelet agents. ASA Grade Assessment: II - A                         patient with mild systemic disease. After reviewing                         the risks and benefits, the patient was deemed in                         satisfactory condition to undergo the procedure. The                         anesthesia plan was to use monitored anesthesia care                         (MAC). Immediately prior to administration of                         medications, the patient was re-assessed for adequacy                         to receive sedatives. The heart rate, respiratory                         rate, oxygen saturations, blood pressure, adequacy of                         pulmonary ventilation, and response to care were                         monitored throughout the procedure. The physical                         status of the patient was re-assessed after the                         procedure.                        After obtaining informed consent, the endoscope was                         passed under direct vision. Throughout the procedure,                         the patient's blood pressure, pulse, and oxygen                         saturations were monitored continuously. The Endoscope  was introduced through the mouth, and advanced to the                         second part of duodenum. The upper GI endoscopy was                         accomplished without difficulty. The patient tolerated                         the procedure fairly well. Findings:      The esophagus, gastroesophageal junction and examined esophagus were       normal.      The stomach was normal.      The examined duodenum was normal. Impression:            - Normal esophagus.                        - Normal stomach.                        - Normal examined duodenum.                        - No specimens collected. Recommendation:        - Discharge patient to home (with escort).                         - Resume previous diet today.                        - Continue present medications.                        - Follow an antireflux regimen.                        - Continue present medications.                        - Use Prilosec (omeprazole) 20 mg PO daily. Procedure Code(s):     --- Professional ---                        [phone], Esophagogastroduodenoscopy, flexible,                         transoral; diagnostic, including collection of                         specimen(s) by brushing or washing, when performed                         (separate procedure) Diagnosis Code(s):     --- Professional ---                        K21.9, Gastro-esophageal reflux disease without                         esophagitis CPT copyright 2019 American Medical Association. All rights reserved. The codes documented in this report are preliminary and upon coder review may  be revised  to meet current compliance requirements. Dr. Ulyess Mort Lin Landsman MD, MD 02/07/2020 8:30:23 AM This report has been signed electronically. Number of Addenda: 0 Note Initiated On: 02/07/2020 8:17 AM Estimated Blood Loss:  Estimated blood loss: none. Estimated blood loss: none.      [facility]

## 2020-02-07 NOTE — Anesthesia Preprocedure Evaluation (Signed)
Anesthesia Evaluation  Patient identified by MRN, date of birth, ID band Patient awake    Reviewed: Allergy & Precautions, H&P , NPO status , Patient's Chart, lab work & pertinent test results, reviewed documented beta blocker date and time   History of Anesthesia Complications Negative for: history of anesthetic complications  Airway Mallampati: I  TM Distance: >3 FB Neck ROM: full    Dental  (+) Dental Advidsory Given, Teeth Intact   Pulmonary neg shortness of breath, sleep apnea and Continuous Positive Airway Pressure Ventilation , neg COPD, neg recent URI,    Pulmonary exam normal        Cardiovascular Exercise Tolerance: Good hypertension, (-) angina(-) CAD, (-) Past MI, (-) Cardiac Stents and (-) CABG Normal cardiovascular exam(-) dysrhythmias (-) Valvular Problems/Murmurs     Neuro/Psych PSYCHIATRIC DISORDERS Anxiety Depression negative neurological ROS     GI/Hepatic Neg liver ROS, GERD  ,  Endo/Other  negative endocrine ROS  Renal/GU negative Renal ROS  negative genitourinary   Musculoskeletal   Abdominal   Peds  Hematology negative hematology ROS (+)   Anesthesia Other Findings Past Medical History: No date: Allergic rhinitis, cause unspecified No date: Anxiety state, unspecified No date: Contact dermatitis and other eczema, due to un* No date: Diffuse cystic mastopathy No date: Dyspepsia and other specified disorders of fun* No date: Essential hypertension, benign No date: Glaucoma No date: Irritable bowel syndrome No date: Lateral epicondylitis  of elbow No date: Leukocytopenia, unspecified No date: Mastodynia No date: Obesity, unspecified No date: Pure hypercholesterolemia No date: Reflux esophagitis No date: Thoracic or lumbosacral neuritis or radiculiti* No date: Tietze's disease No date: Unspecified disorder of skin and subcutaneous * No date: Vaginal cancer ([place])     Comment: History    Reproductive/Obstetrics negative OB ROS                             Anesthesia Physical  Anesthesia Plan  ASA: II  Anesthesia Plan: General   Post-op Pain Management:    Induction: Intravenous  PONV Risk Score and Plan: 3 and Propofol infusion and TIVA  Airway Management Planned: Natural Airway and Nasal Cannula  Additional Equipment:   Intra-op Plan:   Post-operative Plan:   Informed Consent: I have reviewed the patients History and Physical, chart, labs and discussed the procedure including the risks, benefits and alternatives for the proposed anesthesia with the patient or authorized representative who has indicated his/her understanding and acceptance.     Dental Advisory Given  Plan Discussed with: Anesthesiologist, CRNA and Surgeon  Anesthesia Plan Comments:         Anesthesia Quick Evaluation

## 2020-02-08 ENCOUNTER — Encounter: Payer: Self-pay | Admitting: Gastroenterology

## 2020-02-08 NOTE — Anesthesia Postprocedure Evaluation (Signed)
Anesthesia Post Note  Patient: Vickie Stewart  Procedure(s) Performed: ESOPHAGOGASTRODUODENOSCOPY (EGD) WITH PROPOFOL (N/A )  Patient location during evaluation: Endoscopy Anesthesia Type: General Level of consciousness: awake and alert Pain management: pain level controlled Vital Signs Assessment: post-procedure vital signs reviewed and stable Respiratory status: spontaneous breathing, nonlabored ventilation, respiratory function stable and patient connected to nasal cannula oxygen Cardiovascular status: blood pressure returned to baseline and stable Postop Assessment: no apparent nausea or vomiting Anesthetic complications: no   No complications documented.   Last Vitals:  Vitals:   02/07/20 0856 02/07/20 0906  BP: 140/76 139/68  Pulse: 60 60  Resp: 18 16  Temp:    SpO2: 97% 99%    Last Pain:  Vitals:   02/08/20 0735  TempSrc:   PainSc: 0-No pain                 Martha Clan

## 2020-02-09 ENCOUNTER — Ambulatory Visit: Payer: Medicare PPO | Admitting: Family Medicine

## 2020-02-09 DIAGNOSIS — G4733 Obstructive sleep apnea (adult) (pediatric): Secondary | ICD-10-CM | POA: Diagnosis not present

## 2020-02-14 ENCOUNTER — Other Ambulatory Visit: Payer: Self-pay | Admitting: Family Medicine

## 2020-02-25 DIAGNOSIS — G4733 Obstructive sleep apnea (adult) (pediatric): Secondary | ICD-10-CM | POA: Diagnosis not present

## 2020-02-25 DIAGNOSIS — I10 Essential (primary) hypertension: Secondary | ICD-10-CM | POA: Diagnosis not present

## 2020-03-05 ENCOUNTER — Other Ambulatory Visit: Payer: Self-pay | Admitting: Family Medicine

## 2020-03-05 DIAGNOSIS — I10 Essential (primary) hypertension: Secondary | ICD-10-CM

## 2020-03-11 ENCOUNTER — Ambulatory Visit: Payer: Self-pay | Admitting: Family Medicine

## 2020-03-11 NOTE — Telephone Encounter (Signed)
Pt reports mild SOB "For a long time, years, worsening 2 months ago. States "I think it's  from the almonds I eat every day." No SOB presently. States "Sometimes feels like my throat is tingly and the bottom of my neck has been achy for 2 months."  States she puts on her CPAP and "Goes away." States has appt 1129/21 "To have thyroid checked but looking to see if I can have sooner appt." Pt states she will stop eating almonds. Unable to schedule due to covid screening, SOB. Pt called during lunch break. Assured pt NT would route to practice for PCPs review. Advised ED if symptoms worsen.  Pt verbalizes understanding. CB# [phone] Reason for Disposition . [1] MILD longstanding difficulty breathing AND [2]  SAME as normal    States mild SOB "for years" worsening x 2 months  Answer Assessment - Initial Assessment Questions 1. RESPIRATORY STATUS: "Describe your breathing?" (e.g., wheezing, shortness of breath, unable to speak, severe coughing)     SOB at times 2. ONSET: "When did this breathing problem begin?"      Months ago if not longer 3. PATTERN "Does the difficult breathing come and go, or has it been constant since it started?"      Comes and goes 4. SEVERITY: "How bad is your breathing?" (e.g., mild, moderate, severe)    - MILD: No SOB at rest, mild SOB with walking, speaks normally in sentences, can lay down, no retractions, pulse < 100.    - MODERATE: SOB at rest, SOB with minimal exertion and prefers to sit, cannot lie down flat, speaks in phrases, mild retractions, audible wheezing, pulse 100-120.    - SEVERE: Very SOB at rest, speaks in single words, struggling to breathe, sitting hunched forward, retractions, pulse > 120      Not presently 5. RECURRENT SYMPTOM: "Have you had difficulty breathing before?" If Yes, ask: "When was the last time?" and "What happened that time?"     For months 6. CARDIAC HISTORY: "Do you have any history of heart disease?" (e.g., heart attack, angina,  bypass surgery, angioplasty)      no 7. LUNG HISTORY: "Do you have any history of lung disease?"  (e.g., pulmonary embolus, asthma, emphysema)    No. Does have sleep apnea 8. CAUSE: "What do you think is causing the breathing problem?"      Almonds 9. OTHER SYMPTOMS: "Do you have any other symptoms? (e.g., dizziness, runny nose, cough, chest pain, fever)     Throat tingling x 2 months, achy at bottom of throat.  11. TRAVEL: "Have you traveled out of the country in the last month?" (e.g., travel history, exposures)       no  Protocols used: BREATHING DIFFICULTY-A-AH

## 2020-03-15 ENCOUNTER — Telehealth: Payer: Self-pay | Admitting: Family Medicine

## 2020-03-15 NOTE — Telephone Encounter (Signed)
10.4.21 a CMN was faxed from [facility] and they are calling to check if it was received and the status / please advise  Fax# [phone]

## 2020-03-15 NOTE — Telephone Encounter (Signed)
Re-faxed cpap supply order to [facility] on 03/15/20 @ 9:23pm to fax # [phone].

## 2020-03-19 DIAGNOSIS — H40153 Residual stage of open-angle glaucoma, bilateral: Secondary | ICD-10-CM | POA: Diagnosis not present

## 2020-03-26 DIAGNOSIS — G4733 Obstructive sleep apnea (adult) (pediatric): Secondary | ICD-10-CM | POA: Diagnosis not present

## 2020-03-26 DIAGNOSIS — I10 Essential (primary) hypertension: Secondary | ICD-10-CM | POA: Diagnosis not present

## 2020-04-15 DIAGNOSIS — E041 Nontoxic single thyroid nodule: Secondary | ICD-10-CM | POA: Diagnosis not present

## 2020-04-18 ENCOUNTER — Other Ambulatory Visit: Payer: Self-pay | Admitting: Family Medicine

## 2020-04-18 DIAGNOSIS — G8929 Other chronic pain: Secondary | ICD-10-CM

## 2020-04-18 DIAGNOSIS — E041 Nontoxic single thyroid nodule: Secondary | ICD-10-CM | POA: Diagnosis not present

## 2020-04-18 DIAGNOSIS — M549 Dorsalgia, unspecified: Secondary | ICD-10-CM

## 2020-04-26 DIAGNOSIS — G4733 Obstructive sleep apnea (adult) (pediatric): Secondary | ICD-10-CM | POA: Diagnosis not present

## 2020-04-26 DIAGNOSIS — I10 Essential (primary) hypertension: Secondary | ICD-10-CM | POA: Diagnosis not present

## 2020-04-29 ENCOUNTER — Encounter: Payer: Self-pay | Admitting: Family Medicine

## 2020-04-29 ENCOUNTER — Other Ambulatory Visit: Payer: Self-pay

## 2020-04-29 ENCOUNTER — Ambulatory Visit: Payer: Medicare PPO | Admitting: Family Medicine

## 2020-04-29 VITALS — BP 130/72 | HR 58 | Temp 97.6°F | Resp 15 | Ht 61.0 in | Wt 168.7 lb

## 2020-04-29 DIAGNOSIS — E7800 Pure hypercholesterolemia, unspecified: Secondary | ICD-10-CM

## 2020-04-29 DIAGNOSIS — K219 Gastro-esophageal reflux disease without esophagitis: Secondary | ICD-10-CM | POA: Diagnosis not present

## 2020-04-29 DIAGNOSIS — R0602 Shortness of breath: Secondary | ICD-10-CM | POA: Diagnosis not present

## 2020-04-29 DIAGNOSIS — D329 Benign neoplasm of meninges, unspecified: Secondary | ICD-10-CM

## 2020-04-29 DIAGNOSIS — M542 Cervicalgia: Secondary | ICD-10-CM

## 2020-04-29 DIAGNOSIS — F419 Anxiety disorder, unspecified: Secondary | ICD-10-CM | POA: Diagnosis not present

## 2020-04-29 DIAGNOSIS — I10 Essential (primary) hypertension: Secondary | ICD-10-CM | POA: Diagnosis not present

## 2020-04-29 DIAGNOSIS — M549 Dorsalgia, unspecified: Secondary | ICD-10-CM

## 2020-04-29 DIAGNOSIS — G8929 Other chronic pain: Secondary | ICD-10-CM

## 2020-04-29 DIAGNOSIS — G4733 Obstructive sleep apnea (adult) (pediatric): Secondary | ICD-10-CM

## 2020-04-29 DIAGNOSIS — F32A Depression, unspecified: Secondary | ICD-10-CM

## 2020-04-29 DIAGNOSIS — T7840XD Allergy, unspecified, subsequent encounter: Secondary | ICD-10-CM

## 2020-04-29 DIAGNOSIS — R7989 Other specified abnormal findings of blood chemistry: Secondary | ICD-10-CM | POA: Diagnosis not present

## 2020-04-29 DIAGNOSIS — R21 Rash and other nonspecific skin eruption: Secondary | ICD-10-CM

## 2020-04-29 DIAGNOSIS — E041 Nontoxic single thyroid nodule: Secondary | ICD-10-CM

## 2020-04-29 MED ORDER — EPINEPHRINE 0.3 MG/0.3ML IJ SOAJ: 0.3000 mg | INTRAMUSCULAR | 0 refills | Status: DC | PRN

## 2020-04-29 MED ORDER — TRIAMCINOLONE ACETONIDE 0.1 % EX CREA: TOPICAL_CREAM | Freq: Two times a day (BID) | CUTANEOUS | 0 refills | Status: DC

## 2020-04-29 NOTE — Progress Notes (Signed)
Name: Vickie Stewart   MRN: [id number]    DOB: June 23, 1945   Date:04/29/2020       Progress Note  Subjective  Chief Complaint  Follow up  HPI  HTN: BP at home has been in the 130 range, it is at goal today. No chest pain or palpitation, sob improved once she stopped eating nuts  Meningioma: still under the care of Dr. Michaell Cowing visit was Nov 2020. He stated on his note that since asymptomatic and size is stable and small he does not recommend surgery at this time. She states next MRI is due Nov 2022.   GERD:she is taking omeprazole 20 mg BID and is doing better seen by  GI, states heartburn and indigestion has improved since last visit.   Thyroid nodule: seen by Dr. Gabriel Carina in 2015 no dysphagia, she went back 04/2020 and repeat US just showed a benign cyst left lower lobe  Hyperlipidemia: she takes Crestor, denies myalgias. Also taking fish oil. Last LDL was 77, we will recheck next visit   Dysthymia : she states she is feeling better, phq 9 is unchanged, but she has been feeling more confident and driving better since she has been sleeping better  OSA: she has been wearing CPAP every night and is feeling better, wakes up and has to place mask back on . Unchanged   Neck pain: she had PT but continues to have pain, she states stiffness and has burning sensation that radiates down her shoulders, difficulty sitting for a long time and sometimes it goes down to her lower back.   No paresthesias or weakness. Denies bowel or bladder incontinence. She stopped Lyrica and states doing well, only takes celebrex prn and sometimes takes skelaxin   History of vaginal cancer, s/p brachytherapy done at [facility] , last pap smear was done 2021 and normal   SOB: she states sometimes she feels it is difficulty to breath, but not necessarily with activity, no diaphoresis , nausea or vomiting. She has noticed worsening of GERD and burning on her chest ( feels raw ). She noticed symptoms improved when she  stopped eating nuts , she states once when she ate almonds her throat started to feel tight.   Patient Active Problem List   Diagnosis Date Noted  . Burnout of caregiver 09/19/2019  . OSA (obstructive sleep apnea) 09/19/2019  . Anxiety and depression 09/19/2019  . Left thyroid nodule 08/08/2019  . Neck pain 01/03/2019  . Meningioma ([place]) 10/04/2017  . Lichen sclerosus 34/19/3790  . Vaginal atrophy 03/18/2016  . Encounter for follow-up surveillance of vaginal cancer 03/18/2016  . Eczema 10/31/2015  . Left hand pain 09/10/2015  . Osteopenia after menopause 07/26/2015  . Menopause 07/26/2015  . Irritable colon 07/26/2015  . History of cancer of vagina 07/26/2015  . Diffuse cystic mastopathy 07/26/2015  . Obesity (BMI 30.0-34.9) 07/26/2015  . Intermittent low back pain 07/26/2015  . Hyperlipemia 07/26/2015  . Osteoarthritis 05/13/2015  . Allergic rhinitis 05/13/2015  . Chronic GERD 05/13/2015  . Essential hypertension, benign     Past Surgical History:  Procedure Laterality Date  . BREAST EXCISIONAL BIOPSY Left    neg  . BREAST LUMPECTOMY Left   . COLONOSCOPY  08/26/2006  . COLONOSCOPY WITH PROPOFOL N/A 10/21/2016   Procedure: COLONOSCOPY WITH PROPOFOL;  Surgeon: Christene Lye, MD;  Location: [facility] ENDOSCOPY;  Service: Endoscopy;  Laterality: N/A;  . ESOPHAGOGASTRODUODENOSCOPY (EGD) WITH PROPOFOL N/A 02/07/2020   Procedure: ESOPHAGOGASTRODUODENOSCOPY (EGD) WITH PROPOFOL;  Surgeon: Marius Ditch,  Tally Due, MD;  Location: [place];  Service: Gastroenterology;  Laterality: N/A;    Family History  Problem Relation Age of Onset  . Pulmonary embolism Mother   . Hypertension Father   . Aortic stenosis Father   . Breast cancer Neg Hx     Social History   Tobacco Use  . Smoking status: Never Smoker  . Smokeless tobacco: Never Used  . Tobacco comment: experimented with dip snuff as a child  Substance Use Topics  . Alcohol use: No     Current Outpatient  Medications:  .  aspirin 81 MG tablet, Take 81 mg by mouth daily. Reported on 10/10/2015, Disp: , Rfl:  .  celecoxib (CELEBREX) 200 MG capsule, Take 1 capsule (200 mg total) by mouth daily as needed. Once daily, Disp: 90 capsule, Rfl: 0 .  cholecalciferol (VITAMIN D) 1000 units tablet, Take 1,000 Units by mouth daily., Disp: , Rfl:  .  desonide (DESOWEN) 0.05 % cream, Apply topically 2 (two) times daily., Disp: 60 g, Rfl: 0 .  diltiazem (CARDIZEM CD) 360 MG 24 hr capsule, TAKE 1 CAPSULE (360 MG TOTAL) BY MOUTH DAILY., Disp: 90 capsule, Rfl: 1 .  hydrALAZINE (APRESOLINE) 10 MG tablet, Take 1 tablet (10 mg total) by mouth 3 (three) times daily. If bp remains above 160/90, Disp: 30 tablet, Rfl: 0 .  latanoprost (XALATAN) 0.005 % ophthalmic solution, PLACE 1 DROP IN BOTH EYES AT BEDTIME, Disp: , Rfl: 4 .  Multiple Vitamin (MULTIVITAMIN) tablet, Take 1 tablet by mouth daily. Reported on 10/10/2015, Disp: , Rfl:  .  olmesartan (BENICAR) 20 MG tablet, Take 1 tablet (20 mg total) by mouth daily. In place of lisinopril hctz, Disp: 90 tablet, Rfl: 0 .  Omega-3 Fatty Acids (FISH OIL CONCENTRATE PO), Take by mouth. Reported on 10/10/2015, Disp: , Rfl:  .  rosuvastatin (CRESTOR) 10 MG tablet, Take 1 tablet (10 mg total) by mouth daily., Disp: 90 tablet, Rfl: 3 .  triamcinolone cream (KENALOG) 0.1 %, APPLY TO AFFECTED AREA EXTERNALLY 2 TO 3 TIMES A DAY FOR RASH, Disp: 80 g, Rfl: 1 .  vitamin C (ASCORBIC ACID) 500 MG tablet, Take 500 mg by mouth daily. Reported on 10/10/2015, Disp: , Rfl:  .  vitamin E 400 UNIT capsule, Take 400 Units by mouth daily. Reported on 10/10/2015, Disp: , Rfl:  .  metaxalone (SKELAXIN) 800 MG tablet, Take 1 tablet (800 mg total) by mouth 3 (three) times daily. (Patient not taking: Reported on 04/29/2020), Disp: 90 tablet, Rfl: 0 .  omeprazole (PRILOSEC) 20 MG capsule, Take 1 capsule (20 mg total) by mouth in the morning and at bedtime. (Patient not taking: Reported on 04/29/2020), Disp: 180  capsule, Rfl: 1  Allergies  Allergen Reactions  . Atorvastatin     difficulty concentrating and focusing  . Terbinafine And Related Dermatitis    Rash, itch, skin discoloration    I personally reviewed active problem list, medication list, allergies, family history, social history, health maintenance, notes from last encounter with the patient/caregiver today.   ROS  Constitutional: Negative for fever , positive for weight change.  Respiratory: Negative for cough , positive for intermittent shortness of breath.   Cardiovascular: Negative for chest pain or palpitations.  Gastrointestinal: Negative for abdominal pain, no bowel changes.  Musculoskeletal: Negative for gait problem or joint swelling.  Skin: Positive  for rash.  Neurological: Negative for dizziness or headache.  No other specific complaints in a complete review of systems (except as listed  in HPI above).  Objective  Vitals:   04/29/20 1106  BP: 130/72  Pulse: (!) 58  Resp: 15  Temp: 97.6 F (36.4 C)  TempSrc: Oral  SpO2: 99%  Weight: 168 lb 11.2 oz (76.5 kg)  Height: 5\' 1"  (1.549 m)    Body mass index is 31.88 kg/m.  Physical Exam  Constitutional: Patient appears well-developed and well-nourished. Obese  No distress.  HEENT: head atraumatic, normocephalic, pupils equal and reactive to light,  neck supple Cardiovascular: Normal rate, regular rhythm and normal heart sounds.  No murmur heard. No BLE edema. Pulmonary/Chest: Effort normal and breath sounds normal. No respiratory distress. Abdominal: Soft.  There is no tenderness. Skin: erythematous rash on anterior neck  Psychiatric: Patient has a normal mood and affect. behavior is normal. Judgment and thought content normal.  Recent Results (from the past 2160 hour(s))  SARS CORONAVIRUS 2 (TAT 6-24 HRS) Nasopharyngeal Nasopharyngeal Swab     Status: None   Collection Time: 02/02/20 12:04 PM   Specimen: Nasopharyngeal Swab  Result Value Ref Range   SARS  Coronavirus 2 NEGATIVE NEGATIVE    Comment: (NOTE) SARS-CoV-2 target nucleic acids are NOT DETECTED.  The SARS-CoV-2 RNA is generally detectable in upper and lower respiratory specimens during the acute phase of infection. Negative results do not preclude SARS-CoV-2 infection, do not rule out co-infections with other pathogens, and should not be used as the sole basis for treatment or other patient management decisions. Negative results must be combined with clinical observations, patient history, and epidemiological information. The expected result is Negative.  Fact Sheet for Patients: SugarRoll.be  Fact Sheet for Healthcare Providers: https://www.woods-mathews.com/  This test is not yet approved or cleared by the [place] FDA and  has been authorized for detection and/or diagnosis of SARS-CoV-2 by FDA under an Emergency Use Authorization (EUA). This EUA will remain  in effect (meaning this test can be used) for the duration of the COVID-19 declaration under Se ction 564(b)(1) of the Act, 21 U.S.C. section 360bbb-3(b)(1), unless the authorization is terminated or revoked sooner.  Performed at [facility], [place] [address] [place], [place] [address]      PHQ2/9: Depression screen [facility] 2/9 04/29/2020 01/26/2020 10/20/2019 09/19/2019 08/08/2019  Decreased Interest 1 0 0 1 0  Down, Depressed, Hopeless 1 0 0 0 0  PHQ - 2 Score 2 0 0 1 0  Altered sleeping 1 1 0 1 1  Tired, decreased energy 1 1 0 1 1  Change in appetite 0 1 0 1 0  Feeling bad or failure about yourself  0 0 0 0 0  Trouble concentrating 1 1 0 0 1  Moving slowly or fidgety/restless 0 0 0 0 0  Suicidal thoughts 0 0 0 0 0  PHQ-9 Score 5 4 0 4 3  Difficult doing work/chores Not difficult at all Not difficult at all Not difficult at all - -  Some recent data might be hidden    phq 9 is positive   Fall Risk: Fall Risk  04/29/2020 01/26/2020 10/20/2019 09/19/2019  08/08/2019  Falls in the past year? 0 0 0 0 0  Number falls in past yr: 0 0 0 0 0  Injury with Fall? 0 0 0 0 0  Follow up - - Falls evaluation completed - -     Functional Status Survey: Is the patient deaf or have difficulty hearing?: Yes Does the patient have difficulty seeing, even when wearing glasses/contacts?: No Does the patient have  difficulty concentrating, remembering, or making decisions?: Yes (Remembering) Does the patient have difficulty walking or climbing stairs?: No Does the patient have difficulty dressing or bathing?: No Does the patient have difficulty doing errands alone such as visiting a doctor's office or shopping?: No    Assessment & Plan  1. Essential hypertension, benign  At goal   2. Pure hypercholesterolemia   3. Meningioma ([place])  Repeat MRI in 2022   4. Gastroesophageal reflux disease without esophagitis  Doing well   5. OSA (obstructive sleep apnea)   6. Chronic neck and back pain  Stable   7. Anxiety and depression  Improving   8. SOB (shortness of breath)  - Allergy Panel 18, [facility] [employer] [facility]  9. Thyroid cyst  Seen by endo and unchanged   10. Rash and nonspecific skin eruption  - Allergy Panel 18, [employer] [facility]  11. Abnormal CBC  - CBC with Differential/Platelet  12. Allergic reaction, subsequent encounter  - EPINEPHrine 0.3 mg/0.3 mL IJ SOAJ injection; Inject 0.3 mg into the muscle as needed for anaphylaxis.  Dispense: 2 each; Refill: 0.

## 2020-04-30 LAB — ALLERGY PANEL 18, NUT MIX GROUP
Almonds: <0.1 kU/L
CLASS: 0
CLASS: 0
CLASS: 0
CLASS: 0
CLASS: 0
CLASS: 0
Cashew IgE: <0.1 kU/L
Class: 0
Coconut: <0.1 kU/L
Hazelnut: <0.1 kU/L
Peanut IgE: <0.1 kU/L
Pecan Nut: <0.1 kU/L
Sesame Seed f10: <0.1 kU/L

## 2020-04-30 LAB — CBC WITH DIFFERENTIAL/PLATELET
Absolute Monocytes: 258 cells/uL (ref 200–950)
Basophils Absolute: 51 cells/uL (ref 0–200)
Basophils Relative: 1.5 %
Eosinophils Absolute: 109 cells/uL (ref 15–500)
Eosinophils Relative: 3.2 %
HCT: 41.6 % (ref 35.0–45.0)
Hemoglobin: 13.9 g/dL (ref 11.7–15.5)
Lymphs Abs: 1550 cells/uL (ref 850–3900)
MCH: 29.5 pg (ref 27.0–33.0)
MCHC: 33.4 g/dL (ref 32.0–36.0)
MCV: 88.3 fL (ref 80.0–100.0)
MPV: 12.2 fL (ref 7.5–12.5)
Monocytes Relative: 7.6 %
Neutro Abs: 1431 cells/uL — ABNORMAL LOW (ref 1500–7800)
Neutrophils Relative %: 42.1 %
Platelets: 208 10*3/uL (ref 140–400)
RBC: 4.71 10*6/uL (ref 3.80–5.10)
RDW: 12.6 % (ref 11.0–15.0)
Total Lymphocyte: 45.6 %
WBC: 3.4 10*3/uL — ABNORMAL LOW (ref 3.8–10.8)

## 2020-04-30 LAB — INTERPRETATION:

## 2020-05-01 ENCOUNTER — Telehealth: Payer: Self-pay

## 2020-05-01 NOTE — Telephone Encounter (Signed)
Copied from [place] [id number]. Topic: General - Call Back - No Documentation >> May 01, 2020  1:39 PM Erick Blinks wrote: Reason for CRM: Pt returned shannon's call, pt is requesting a call from any available nurse. She does not want to wait until tomorrow   Best contact: [phone]

## 2020-05-07 ENCOUNTER — Ambulatory Visit (INDEPENDENT_AMBULATORY_CARE_PROVIDER_SITE_OTHER): Payer: Medicare PPO | Admitting: Gastroenterology

## 2020-05-07 ENCOUNTER — Encounter: Payer: Self-pay | Admitting: Gastroenterology

## 2020-05-07 VITALS — BP 136/80 | HR 54 | Temp 97.8°F | Ht 61.0 in | Wt 170.0 lb

## 2020-05-07 DIAGNOSIS — R221 Localized swelling, mass and lump, neck: Secondary | ICD-10-CM

## 2020-05-07 NOTE — Progress Notes (Signed)
Vickie Darby, MD [address]  [place], [place] [address]  Main: [phone]  Fax: [phone]    Gastroenterology Consultation  Referring Provider:     Steele Sizer, MD Primary Care Physician:  Steele Sizer, MD Primary Gastroenterologist:  Dr. Cephas Stewart Reason for Consultation:     Rectal bleeding, heartburn        HPI:   Vickie Stewart is a 74 y.o. female referred by Dr. Steele Sizer, MD  for consultation & management of rectal bleeding and heartburn  Heartburn: Patient reports chronic, several years history of burning in her throat, her throat feels raw, constant clearing of throat. She was on omeprazole 40 mg once daily, switched to 20 mg two times a day to see if it helps any better. She reports modest relief in her symptoms. She denies difficulty swallowing. She does drink 2 cups of coffee daily, and late dinners usually. She does drink Pepsi few times a week she was going through a lot of stress taking care of her dad who was 36 year old until recently. She does not have good sleep. She has been gaining weight.  Rectal bleeding: Bright red blood per rectum, hemoglobin only uses intermittent rectal associated with straining. She reports that her stools, sometimes hard to benign when she eats. She had a colonoscopy in 2008 as well as 2018 which revealed hemorrhoids and diverticulosis only She does take Citrucel daily  Follow-up visit 05/07/2020 Patient underwent EGD which was unremarkable.  She continues to take Prilosec 20 mg daily.  She continues to have sensation of something in her throat especially when she bends/hunches her neck .  She had an ultrasound of thyroid in 04/2020, showed benign cyst of the left lower lobe.  Patient does not think her symptoms are due to acid reflux.  She is no longer experiencing rectal bleeding.  She continues to stay on high-fiber diet, with adequate intake of water.  Denies any constipation.  Patient does  not have any other concerns today  She does not smoke or drink alcohol  NSAIDs: None  Antiplts/Anticoagulants/Anti thrombotics: None  GI Procedures: Colonoscopy in 2008 and 2018 revealed diverticulosis and internal hemorrhoids She underwent upper endoscopy 07/2006 for reflux, which was normal EGD 02/07/2020 - Normal esophagus. - Normal stomach. - Normal examined duodenum. - No specimens collected.    Past Medical History:  Diagnosis Date  . Allergic rhinitis, cause unspecified   . Anxiety state, unspecified   . Contact dermatitis and other eczema, due to unspecified cause   . Diffuse cystic mastopathy   . Dyspepsia and other specified disorders of function of stomach   . Essential hypertension, benign   . Glaucoma   . Irritable bowel syndrome   . Lateral epicondylitis  of elbow   . Leukocytopenia, unspecified   . Mastodynia   . Obesity, unspecified   . Pure hypercholesterolemia   . Reflux esophagitis   . Thoracic or lumbosacral neuritis or radiculitis, unspecified   . Unspecified disorder of skin and subcutaneous tissue   . Vaginal cancer [facility])    History    Past Surgical History:  Procedure Laterality Date  . BREAST EXCISIONAL BIOPSY Left    neg  . BREAST LUMPECTOMY Left   . COLONOSCOPY  08/26/2006  . COLONOSCOPY WITH PROPOFOL N/A 10/21/2016   Procedure: COLONOSCOPY WITH PROPOFOL;  Surgeon: Christene Lye, MD;  Location: [facility] ENDOSCOPY;  Service: Endoscopy;  Laterality: N/A;  . ESOPHAGOGASTRODUODENOSCOPY (EGD) WITH PROPOFOL N/A 02/07/2020  Procedure: ESOPHAGOGASTRODUODENOSCOPY (EGD) WITH PROPOFOL;  Surgeon: Lin Landsman, MD;  Location: [facility] ENDOSCOPY;  Service: Gastroenterology;  Laterality: N/A;    Current Outpatient Medications:  .  aspirin 81 MG tablet, Take 81 mg by mouth daily. Reported on 10/10/2015, Disp: , Rfl:  .  celecoxib (CELEBREX) 200 MG capsule, Take 1 capsule (200 mg total) by mouth daily as needed. Once daily, Disp: 90 capsule, Rfl: 0 .   cholecalciferol (VITAMIN D) 1000 units tablet, Take 1,000 Units by mouth daily., Disp: , Rfl:  .  desonide (DESOWEN) 0.05 % cream, Apply topically 2 (two) times daily., Disp: 60 g, Rfl: 0 .  diltiazem (CARDIZEM CD) 360 MG 24 hr capsule, TAKE 1 CAPSULE (360 MG TOTAL) BY MOUTH DAILY., Disp: 90 capsule, Rfl: 1 .  EPINEPHrine 0.3 mg/0.3 mL IJ SOAJ injection, Inject 0.3 mg into the muscle as needed for anaphylaxis., Disp: 2 each, Rfl: 0 .  hydrALAZINE (APRESOLINE) 10 MG tablet, Take 1 tablet (10 mg total) by mouth 3 (three) times daily. If bp remains above 160/90, Disp: 30 tablet, Rfl: 0 .  latanoprost (XALATAN) 0.005 % ophthalmic solution, PLACE 1 DROP IN BOTH EYES AT BEDTIME, Disp: , Rfl: 4 .  metaxalone (SKELAXIN) 800 MG tablet, Take 1 tablet (800 mg total) by mouth 3 (three) times daily., Disp: 90 tablet, Rfl: 0 .  Multiple Vitamin (MULTIVITAMIN) tablet, Take 1 tablet by mouth daily. Reported on 10/10/2015, Disp: , Rfl:  .  olmesartan (BENICAR) 20 MG tablet, Take 1 tablet (20 mg total) by mouth daily. In place of lisinopril hctz, Disp: 90 tablet, Rfl: 0 .  Omega-3 Fatty Acids (FISH OIL CONCENTRATE PO), Take by mouth. Reported on 10/10/2015, Disp: , Rfl:  .  omeprazole (PRILOSEC) 20 MG capsule, Take 1 capsule (20 mg total) by mouth in the morning and at bedtime., Disp: 180 capsule, Rfl: 1 .  rosuvastatin (CRESTOR) 10 MG tablet, Take 1 tablet (10 mg total) by mouth daily., Disp: 90 tablet, Rfl: 3 .  triamcinolone (KENALOG) 0.1 %, Apply topically 2 (two) times daily., Disp: 453.6 g, Rfl: 0 .  vitamin C (ASCORBIC ACID) 500 MG tablet, Take 500 mg by mouth daily. Reported on 10/10/2015, Disp: , Rfl:  .  vitamin E 400 UNIT capsule, Take 400 Units by mouth daily. Reported on 10/10/2015, Disp: , Rfl:    Family History  Problem Relation Age of Onset  . Pulmonary embolism Mother   . Hypertension Father   . Aortic stenosis Father   . Breast cancer Neg Hx      Social History   Tobacco Use  . Smoking  status: Never Smoker  . Smokeless tobacco: Never Used  . Tobacco comment: experimented with dip snuff as a child  Vaping Use  . Vaping Use: Never used  Substance Use Topics  . Alcohol use: No  . Drug use: No    Allergies as of 05/07/2020 - Review Complete 05/07/2020  Allergen Reaction Noted  . Atorvastatin  07/26/2015  . Terbinafine and related Dermatitis 01/23/2016    Review of Systems:    All systems reviewed and negative except where noted in HPI.   Physical Exam:  BP 136/80 (BP Location: Left Arm, Patient Position: Sitting, Cuff Size: Normal)   Pulse (!) 54   Temp 97.8 F (36.6 C) (Oral)   Ht 5\' 1"  (1.549 m)   Wt 170 lb (77.1 kg)   BMI 32.12 kg/m  No LMP recorded. Patient is postmenopausal.  General:   Alert,  Well-developed,  well-nourished, pleasant and cooperative in NAD Head:  Normocephalic and atraumatic. Eyes:  Sclera clear, no icterus.   Conjunctiva pink. Ears:  Normal auditory acuity. Nose:  No deformity, discharge, or lesions. Mouth:  No deformity or lesions,oropharynx pink & moist. Neck: Pulsatile, palpable, localized fullness in her lower neck, right to the supra sternal notch Lungs:  Respirations even and unlabored.  Clear throughout to auscultation.   No wheezes, crackles, or rhonchi. No acute distress. Heart:  Regular rate and rhythm; no murmurs, clicks, rubs, or gallops. Abdomen:  Normal bowel sounds. Soft, non-tender and non-distended without masses, hepatosplenomegaly or hernias noted.  No guarding or rebound tenderness.   Rectal: Not performed Msk:  Symmetrical without gross deformities. Good, equal movement & strength bilaterally. Pulses:  Normal pulses noted. Extremities:  No clubbing or edema.  No cyanosis. Neurologic:  Alert and oriented x3;  grossly normal neurologically. Skin:  Intact without significant lesions or rashes. No jaundice. Psych:  Alert and cooperative. Normal mood and affect.  Imaging Studies: No abdominal imaging  Assessment  and Plan:   Marveline Prisila Dlouhy is a 74 y.o. female with is seen in for follow-up of globus sensation, GERD and bright red blood per rectum  Throat discomfort/globus sensation Palpable pulsatile localized lesion at the base of neck, referral to vascular surgery placed Discussed the same with Dr. Ancil Boozer  Chronic GERD: Continue omeprazole 20 mg daily before meals EGD is unremarkable  Rectal blood per rectum, most likely hemorrhoidal bleeding secondary to straining No evidence of anemia Continue high-fiber diet Adequate intake of water Defer hemorrhoid ligation unless symptoms are persistent   Follow up as needed   Vickie Darby, MD

## 2020-05-11 ENCOUNTER — Emergency Department
Admission: EM | Admit: 2020-05-11 | Discharge: 2020-05-11 | Disposition: A | Discharge status: Home / Self Care | Payer: Medicare PPO | Attending: Emergency Medicine | Admitting: Emergency Medicine

## 2020-05-11 ENCOUNTER — Encounter: Payer: Self-pay | Admitting: *Deleted

## 2020-05-11 ENCOUNTER — Other Ambulatory Visit: Payer: Self-pay

## 2020-05-11 ENCOUNTER — Emergency Department: Payer: Medicare PPO

## 2020-05-11 DIAGNOSIS — H6992 Unspecified Eustachian tube disorder, left ear: Secondary | ICD-10-CM | POA: Insufficient documentation

## 2020-05-11 DIAGNOSIS — M4802 Spinal stenosis, cervical region: Secondary | ICD-10-CM | POA: Diagnosis not present

## 2020-05-11 DIAGNOSIS — Z79899 Other long term (current) drug therapy: Secondary | ICD-10-CM | POA: Insufficient documentation

## 2020-05-11 DIAGNOSIS — H6692 Otitis media, unspecified, left ear: Secondary | ICD-10-CM | POA: Diagnosis not present

## 2020-05-11 DIAGNOSIS — Z7982 Long term (current) use of aspirin: Secondary | ICD-10-CM | POA: Insufficient documentation

## 2020-05-11 DIAGNOSIS — R0602 Shortness of breath: Secondary | ICD-10-CM | POA: Diagnosis not present

## 2020-05-11 DIAGNOSIS — H6982 Other specified disorders of Eustachian tube, left ear: Secondary | ICD-10-CM

## 2020-05-11 DIAGNOSIS — R42 Dizziness and giddiness: Secondary | ICD-10-CM | POA: Insufficient documentation

## 2020-05-11 DIAGNOSIS — M542 Cervicalgia: Secondary | ICD-10-CM | POA: Insufficient documentation

## 2020-05-11 DIAGNOSIS — I6523 Occlusion and stenosis of bilateral carotid arteries: Secondary | ICD-10-CM | POA: Diagnosis not present

## 2020-05-11 DIAGNOSIS — I10 Essential (primary) hypertension: Secondary | ICD-10-CM | POA: Diagnosis not present

## 2020-05-11 DIAGNOSIS — H814 Vertigo of central origin: Secondary | ICD-10-CM | POA: Diagnosis not present

## 2020-05-11 LAB — URINALYSIS, COMPLETE (UACMP) WITH MICROSCOPIC
Bacteria, UA: NONE SEEN
Bilirubin Urine: NEGATIVE
Glucose, UA: NEGATIVE mg/dL
Hgb urine dipstick: NEGATIVE
Ketones, ur: NEGATIVE mg/dL
Leukocytes,Ua: NEGATIVE
Nitrite: NEGATIVE
Protein, ur: NEGATIVE mg/dL
Specific Gravity, Urine: 1.002 — ABNORMAL LOW (ref 1.005–1.030)
Squamous Epithelial / HPF: NONE SEEN (ref 0–5)
pH: 7 (ref 5.0–8.0)

## 2020-05-11 LAB — BASIC METABOLIC PANEL
Anion gap: 9 (ref 5–15)
BUN: 21 mg/dL (ref 8–23)
CO2: 27 mmol/L (ref 22–32)
Calcium: 9.7 mg/dL (ref 8.9–10.3)
Chloride: 104 mmol/L (ref 98–111)
Creatinine, Ser: 0.63 mg/dL (ref 0.44–1.00)
GFR, Estimated: >60 mL/min (ref >60)
Glucose, Bld: 97 mg/dL (ref 70–99)
Potassium: 3.7 mmol/L (ref 3.5–5.1)
Sodium: 140 mmol/L (ref 135–145)

## 2020-05-11 LAB — TROPONIN I (HIGH SENSITIVITY)
Troponin I (High Sensitivity): 13 ng/L (ref <18)
Troponin I (High Sensitivity): 12 ng/L (ref <18)

## 2020-05-11 LAB — CBC
HCT: 42.3 % (ref 36.0–46.0)
Hemoglobin: 14.2 g/dL (ref 12.0–15.0)
MCH: 29.4 pg (ref 26.0–34.0)
MCHC: 33.6 g/dL (ref 30.0–36.0)
MCV: 87.6 fL (ref 80.0–100.0)
Platelets: 245 10*3/uL (ref 150–400)
RBC: 4.83 MIL/uL (ref 3.87–5.11)
RDW: 13 % (ref 11.5–15.5)
WBC: 4.3 10*3/uL (ref 4.0–10.5)
nRBC: 0 % (ref 0.0–0.2)

## 2020-05-11 MED ORDER — CETIRIZINE HCL 10 MG PO TABS: 10.0000 mg | ORAL_TABLET | Freq: Every day | ORAL | 0 refills | Status: DC

## 2020-05-11 MED ORDER — IOHEXOL 350 MG/ML SOLN
75.0000 mL | Freq: Once | INTRAVENOUS | Status: AC | PRN
  Administered 2020-05-11: 13:00:00 75 mL via INTRAVENOUS

## 2020-05-11 MED ORDER — MECLIZINE HCL 25 MG PO TABS: 25.0000 mg | ORAL_TABLET | Freq: Three times a day (TID) | ORAL | 0 refills | Status: DC | PRN

## 2020-05-11 MED ORDER — FLUTICASONE PROPIONATE 50 MCG/ACT NA SUSP: 1.0000 | Freq: Two times a day (BID) | NASAL | 0 refills | Status: DC

## 2020-05-11 NOTE — ED Provider Notes (Signed)
[facility] Emergency Department Provider Note  ____________________________________________  Time seen: Approximately 12:11 PM  I have reviewed the triage vital signs and the nursing notes.   HISTORY  Chief Complaint Dizziness    HPI Vickie Stewart is a 74 y.o. female with a medical history described below, who presents the emergency department complaining of dizziness, anterior neck pain, hypertension.  Patient states that she has had vertigo in the past, this dizziness feels different.  She states that if she lays on her right side, she has no difficulties, she lays on her left side she states that everything feels like it is "spinning around me."  Patient states that she has had some fullness/pressure in this year leading up to the symptoms.  No recent head trauma.  No weakness unilaterally.  Patient has not taken any medication for this complaint in the past.  Patient is also complaining of a pulsatile mass in the anterior aspect of the neck.  Patient states that she has seen her primary care, GI for this complaint and is referred to vascular surgery for same.  She has had evaluation with ultrasound which showed a nodule in her thyroid as well as an apparent endoscopy which was unremarkable.  Patient states that if she cough, the anterior part of her neck swells, then finally will "settle down."  Patient states that there is a constant pain in the area of pulsatile region.   Patient states that she also was concerned as she had hypertension at home.  Patient had blood pressure of 180/75.  This has steadily trended down without any intervention.  Patient denies any chest pain or active shortness of breath.  She states that she has exertional shortness of breath but this has been chronic for some time.  No other complaints at this time.        Past Medical History:  Diagnosis Date  . Allergic rhinitis, cause unspecified   . Anxiety state, unspecified    . Contact dermatitis and other eczema, due to unspecified cause   . Diffuse cystic mastopathy   . Dyspepsia and other specified disorders of function of stomach   . Essential hypertension, benign   . Glaucoma   . Irritable bowel syndrome   . Lateral epicondylitis  of elbow   . Leukocytopenia, unspecified   . Mastodynia   . Obesity, unspecified   . Pure hypercholesterolemia   . Reflux esophagitis   . Thoracic or lumbosacral neuritis or radiculitis, unspecified   . Unspecified disorder of skin and subcutaneous tissue   . Vaginal cancer [facility])    History    Patient Active Problem List   Diagnosis Date Noted  . Burnout of caregiver 09/19/2019  . OSA (obstructive sleep apnea) 09/19/2019  . Anxiety and depression 09/19/2019  . Left thyroid nodule 08/08/2019  . Neck pain 01/03/2019  . Meningioma ([place]) 10/04/2017  . Lichen sclerosus 63/89/3734  . Vaginal atrophy 03/18/2016  . Encounter for follow-up surveillance of vaginal cancer 03/18/2016  . Eczema 10/31/2015  . Left hand pain 09/10/2015  . Osteopenia after menopause 07/26/2015  . Menopause 07/26/2015  . Irritable colon 07/26/2015  . History of cancer of vagina 07/26/2015  . Diffuse cystic mastopathy 07/26/2015  . Obesity (BMI 30.0-34.9) 07/26/2015  . Intermittent low back pain 07/26/2015  . Hyperlipemia 07/26/2015  . Osteoarthritis 05/13/2015  . Allergic rhinitis 05/13/2015  . Chronic GERD 05/13/2015  . Essential hypertension, benign     Past Surgical History:  Procedure Laterality Date  .  BREAST EXCISIONAL BIOPSY Left    neg  . BREAST LUMPECTOMY Left   . COLONOSCOPY  08/26/2006  . COLONOSCOPY WITH PROPOFOL N/A 10/21/2016   Procedure: COLONOSCOPY WITH PROPOFOL;  Surgeon: Christene Lye, MD;  Location: [facility] ENDOSCOPY;  Service: Endoscopy;  Laterality: N/A;  . ESOPHAGOGASTRODUODENOSCOPY (EGD) WITH PROPOFOL N/A 02/07/2020   Procedure: ESOPHAGOGASTRODUODENOSCOPY (EGD) WITH PROPOFOL;  Surgeon: Lin Landsman,  MD;  Location: [facility] ENDOSCOPY;  Service: Gastroenterology;  Laterality: N/A;    Prior to Admission medications   Medication Sig Start Date End Date Taking? Authorizing Provider  aspirin 81 MG tablet Take 81 mg by mouth daily. Reported on 10/10/2015    [provider]  celecoxib (CELEBREX) 200 MG capsule Take 1 capsule (200 mg total) by mouth daily as needed. Once daily 01/26/20   Steele Sizer, MD  cetirizine (ZYRTEC) 10 MG tablet Take 1 tablet (10 mg total) by mouth daily. 05/11/20   , Charline Bills, PA-C  cholecalciferol (VITAMIN D) 1000 units tablet Take 1,000 Units by mouth daily.    [provider]  desonide (DESOWEN) 0.05 % cream Apply topically 2 (two) times daily. 12/08/17   Steele Sizer, MD  diltiazem (CARDIZEM CD) 360 MG 24 hr capsule TAKE 1 CAPSULE (360 MG TOTAL) BY MOUTH DAILY. 11/19/19   Steele Sizer, MD  EPINEPHrine 0.3 mg/0.3 mL IJ SOAJ injection Inject 0.3 mg into the muscle as needed for anaphylaxis. 04/29/20   Steele Sizer, MD  fluticasone (FLONASE) 50 MCG/ACT nasal spray Place 1 spray into both nostrils 2 (two) times daily. 05/11/20   , Charline Bills, PA-C  hydrALAZINE (APRESOLINE) 10 MG tablet Take 1 tablet (10 mg total) by mouth 3 (three) times daily. If bp remains above 160/90 02/01/19   Steele Sizer, MD  latanoprost (XALATAN) 0.005 % ophthalmic solution PLACE 1 DROP IN BOTH EYES AT BEDTIME 06/20/17   [provider]  meclizine (ANTIVERT) 25 MG tablet Take 1 tablet (25 mg total) by mouth 3 (three) times daily as needed for dizziness. 05/11/20   , Charline Bills, PA-C  metaxalone (SKELAXIN) 800 MG tablet Take 1 tablet (800 mg total) by mouth 3 (three) times daily. 09/19/19   Steele Sizer, MD  Multiple Vitamin (MULTIVITAMIN) tablet Take 1 tablet by mouth daily. Reported on 10/10/2015    [provider]  olmesartan (BENICAR) 20 MG tablet Take 1 tablet (20 mg total) by mouth daily. In place of lisinopril hctz 01/26/20    Steele Sizer, MD  Omega-3 Fatty Acids (FISH OIL CONCENTRATE PO) Take by mouth. Reported on 10/10/2015    [provider]  omeprazole (PRILOSEC) 20 MG capsule Take 1 capsule (20 mg total) by mouth in the morning and at bedtime. 01/26/20   Steele Sizer, MD  rosuvastatin (CRESTOR) 10 MG tablet Take 1 tablet (10 mg total) by mouth daily. 08/08/19   Steele Sizer, MD  triamcinolone (KENALOG) 0.1 % Apply topically 2 (two) times daily. 04/29/20   Steele Sizer, MD  vitamin C (ASCORBIC ACID) 500 MG tablet Take 500 mg by mouth daily. Reported on 10/10/2015    [provider]  vitamin E 400 UNIT capsule Take 400 Units by mouth daily. Reported on 10/10/2015    [provider]    Allergies Atorvastatin and Terbinafine and related  Family History  Problem Relation Age of Onset  . Pulmonary embolism Mother   . Hypertension Father   . Aortic stenosis Father   . Breast cancer Neg Hx     Social History Social  History   Tobacco Use  . Smoking status: Never Smoker  . Smokeless tobacco: Never Used  . Tobacco comment: experimented with dip snuff as a child  Vaping Use  . Vaping Use: Never used  Substance Use Topics  . Alcohol use: No  . Drug use: No     Review of Systems  Constitutional: No fever/chills Eyes: No visual changes. No discharge ENT: Left ear pressure.  Pulsatile painful region to the anterior neck Cardiovascular: no chest pain. Respiratory: no cough. No SOB. Gastrointestinal: No abdominal pain.  No nausea, no vomiting.  No diarrhea.  No constipation. Genitourinary: Negative for dysuria. No hematuria Musculoskeletal: Negative for musculoskeletal pain. Skin: Negative for rash, abrasions, lacerations, ecchymosis. Neurological: Positive for dizziness/spinning sensation.  Negative for headaches, focal weakness or numbness.  10 System ROS otherwise negative.  ____________________________________________   PHYSICAL EXAM:  VITAL SIGNS: ED Triage  Vitals  Enc Vitals Group     BP 05/11/20 0047 (!) 157/49     Pulse Rate 05/11/20 0047 (!) 59     Resp 05/11/20 0047 16     Temp 05/11/20 0047 98 F (36.7 C)     Temp Source 05/11/20 0047 Oral     SpO2 05/11/20 0047 98 %     Weight 05/11/20 0042 169 lb 15.6 oz (77.1 kg)     Height 05/11/20 0042 5\' 1"  (1.549 m)     Head Circumference --      Peak Flow --      Pain Score 05/11/20 0041 0     Pain Loc --      Pain Edu? --      Excl. in [place]? --      Constitutional: Alert and oriented. Well appearing and in no acute distress. Eyes: Conjunctivae are normal. PERRL. EOMI. Head: Atraumatic. ENT:      Ears: EAC and TM on right is unremarkable.  EAC on left is unremarkable.  TM is moderately bulging with no injection or air-fluid level.      Nose: No congestion/rhinnorhea.      Mouth/Throat: Mucous membranes are moist.  No oropharyngeal erythema or edema.  Uvula is midline. Neck: No stridor.  No cervical spine tenderness to palpation.  Visualization of the anterior neck reveals a pulsatile finding along the mid line extending into the right side of the neck.  Palpation in this area reveals tenderness as well as pulsatile finding.  Auscultation reveals slight bruit in this area.  No gross erythema over the neck.  No other visible or palpable findings in the neck. Hematological/Lymphatic/Immunilogical: No cervical lymphadenopathy. Cardiovascular: Normal rate, regular rhythm. Normal S1 and S2.  Good peripheral circulation. Respiratory: Normal respiratory effort without tachypnea or retractions. Lungs CTAB. Good air entry to the bases with no decreased or absent breath sounds. Musculoskeletal: Full range of motion to all extremities. No gross deformities appreciated. Neurologic:  Normal speech and language. No gross focal neurologic deficits are appreciated.  Cranial nerves II through XII grossly intact.  Negative Romberg's and pronator drift. Skin:  Skin is warm, dry and intact. No rash  noted. Psychiatric: Mood and affect are normal. Speech and behavior are normal. Patient exhibits appropriate insight and judgement.   ____________________________________________   LABS (all labs ordered are listed, but only abnormal results are displayed)  Labs Reviewed  URINALYSIS, COMPLETE (UACMP) WITH MICROSCOPIC - Abnormal; Notable for the following components:      Result Value   Color, Urine COLORLESS (*)    APPearance CLEAR (*)  Specific Gravity, Urine 1.002 (*)    All other components within normal limits  BASIC METABOLIC PANEL  CBC  TROPONIN I (HIGH SENSITIVITY)  TROPONIN I (HIGH SENSITIVITY)   ____________________________________________  EKG   ____________________________________________  RADIOLOGY I personally viewed and evaluated these images as part of my medical decision making, as well as reviewing the written report by the radiologist.  ED Provider Interpretation: No acute findings on chest x-ray.  Visualization of the CT reveal no acute findings on CT angio head or neck to explain pulsatile finding on physical exam.  CT Angio Head W or Wo Contrast  Result Date: 05/11/2020 CLINICAL DATA:  Dizziness, persistent/recurrent, cardiac or vascular cause suspected EXAM: CT ANGIOGRAPHY HEAD AND NECK TECHNIQUE: Multidetector CT imaging of the head and neck was performed using the standard protocol during bolus administration of intravenous contrast. Multiplanar CT image reconstructions and MIPs were obtained to evaluate the vascular anatomy. Carotid stenosis measurements (when applicable) are obtained utilizing NASCET criteria, using the distal internal carotid diameter as the denominator. CONTRAST:  5mL OMNIPAQUE IOHEXOL 350 MG/ML SOLN COMPARISON:  04/20/2019 and prior FINDINGS: CT HEAD FINDINGS Brain: No acute infarct or intracranial hemorrhage. 2.0 x 1.3 cm right parafalcine meningioma is unchanged in size. No midline shift, ventriculomegaly or extra-axial fluid  collection. Vascular: No hyperdense vessel or unexpected calcification. Bilateral carotid siphon atherosclerotic calcifications. Skull: No acute finding. Sinuses/Orbits: Normal orbits. Pneumatized paranasal sinuses and mastoid air cells. Other: None. Review of the MIP images confirms the above findings CTA NECK FINDINGS Aortic arch: Minimal aortic arch atherosclerotic calcifications. Bovine arch anatomy. Patent great vessel origins. Right carotid system: Patent. Minimal bifurcation atherosclerotic calcifications. Left carotid system: Patent. Vertebral arteries: Mildly dominant left vertebral artery.  Patent. Skeleton: No acute finding.  Multilevel spondylosis. Other neck: Incidental subcentimeter left thyroid hypodensity. No adenopathy. Upper chest: Atelectasis.  No acute finding. Review of the MIP images confirms the above findings CTA HEAD FINDINGS Anterior circulation: Patent. Minimal carotid siphon atherosclerotic calcifications without significant narrowing. No significant stenosis, proximal occlusion, aneurysm, or vascular malformation. Posterior circulation: Patent V4 segments and basilar artery. Patent superior cerebellar and posterior cerebral arteries. No significant stenosis, proximal occlusion, aneurysm, or vascular malformation. Venous sinuses: As permitted by contrast timing, patent. Anatomic variants: None. Review of the MIP images confirms the above findings IMPRESSION: No large vessel occlusion, high-grade narrowing, dissection or aneurysm. No acute intracranial finding. 2.0 cm right para falcine meningioma is unchanged. Electronically Signed   By: Primitivo Gauze M.D.   On: 05/11/2020 14:21   DG Chest 2 View  Result Date: 05/11/2020 CLINICAL DATA:  Shortness of breath EXAM: CHEST - 2 VIEW COMPARISON:  05/02/2009 FINDINGS: Heart is normal size. Scattered aortic atherosclerosis. Lungs clear. No effusions or acute bony abnormality. IMPRESSION: No active cardiopulmonary disease. Electronically  Signed   By: Rolm Baptise M.D.   On: 05/11/2020 01:27   CT Angio Neck W and/or Wo Contrast  Result Date: 05/11/2020 CLINICAL DATA:  Dizziness, persistent/recurrent, cardiac or vascular cause suspected EXAM: CT ANGIOGRAPHY HEAD AND NECK TECHNIQUE: Multidetector CT imaging of the head and neck was performed using the standard protocol during bolus administration of intravenous contrast. Multiplanar CT image reconstructions and MIPs were obtained to evaluate the vascular anatomy. Carotid stenosis measurements (when applicable) are obtained utilizing NASCET criteria, using the distal internal carotid diameter as the denominator. CONTRAST:  38mL OMNIPAQUE IOHEXOL 350 MG/ML SOLN COMPARISON:  04/20/2019 and prior FINDINGS: CT HEAD FINDINGS Brain: No acute infarct or intracranial hemorrhage. 2.0 x 1.3  cm right parafalcine meningioma is unchanged in size. No midline shift, ventriculomegaly or extra-axial fluid collection. Vascular: No hyperdense vessel or unexpected calcification. Bilateral carotid siphon atherosclerotic calcifications. Skull: No acute finding. Sinuses/Orbits: Normal orbits. Pneumatized paranasal sinuses and mastoid air cells. Other: None. Review of the MIP images confirms the above findings CTA NECK FINDINGS Aortic arch: Minimal aortic arch atherosclerotic calcifications. Bovine arch anatomy. Patent great vessel origins. Right carotid system: Patent. Minimal bifurcation atherosclerotic calcifications. Left carotid system: Patent. Vertebral arteries: Mildly dominant left vertebral artery.  Patent. Skeleton: No acute finding.  Multilevel spondylosis. Other neck: Incidental subcentimeter left thyroid hypodensity. No adenopathy. Upper chest: Atelectasis.  No acute finding. Review of the MIP images confirms the above findings CTA HEAD FINDINGS Anterior circulation: Patent. Minimal carotid siphon atherosclerotic calcifications without significant narrowing. No significant stenosis, proximal occlusion,  aneurysm, or vascular malformation. Posterior circulation: Patent V4 segments and basilar artery. Patent superior cerebellar and posterior cerebral arteries. No significant stenosis, proximal occlusion, aneurysm, or vascular malformation. Venous sinuses: As permitted by contrast timing, patent. Anatomic variants: None. Review of the MIP images confirms the above findings IMPRESSION: No large vessel occlusion, high-grade narrowing, dissection or aneurysm. No acute intracranial finding. 2.0 cm right para falcine meningioma is unchanged. Electronically Signed   By: Primitivo Gauze M.D.   On: 05/11/2020 14:21    ____________________________________________    PROCEDURES  Procedure(s) performed:    Procedures    Medications  iohexol (OMNIPAQUE) 350 MG/ML injection 75 mL (75 mLs Intravenous Contrast Given 05/11/20 1314)     ____________________________________________   INITIAL IMPRESSION / ASSESSMENT AND PLAN / ED COURSE  Pertinent labs & imaging results that were available during my care of the patient were reviewed by me and considered in my medical decision making (see chart for details).  Review of the [place] CSRS was performed in accordance of the [place] prior to dispensing any controlled drugs.  Clinical Course as of 05/11/20 1545  Sat May 11, 2020  1256 Patient presented to the emergency department complaining of several complaints.  Patient had vertigo-like symptoms.  She has a history of same but states that it felt different.  There was some bulging of the left TM, I feel that dizziness is likely in some part due to this finding as well as her history of vertigo.  Patient also had a complaint of a pulsatile mass to the anterior aspect of the neck.  This is visualized on exam.  She is supposed to follow-up with vascular surgery for further work-up in this area.  Given the complaints of hypertension, dizziness I will evaluate the patient with CT angio head and neck for further  evaluation of this finding.  Patient remains neurologically intact currently. [JC]    Clinical Course User Index [JC] , Charline Bills, PA-C          Patient's diagnosis is consistent with vertigo secondary to eustachian tube dysfunction, anterior neck pain with pulsatile mass.  Patient presented to emergency department with 2 separate complaints.  Findings on physical exam show increased fluid in the left ear consistent with eustachian tube dysfunction.  This is likely aggravating patient vertigo.  Patient will be placed on Flonase, Zyrtec and meclizine for her vertigo.  Patient is also had an ongoing history of a pulsatile neck pain as it is scheduled to see vascular surgery.  Given the dizziness complaints imaging of the head and neck were obtained.  No evidence of dissection or aneurysm of vasculature.  No appreciable soft tissue  masses.  At this time, continue plan from primary care to follow-up with vascular surgery for this finding..  Patient is stable for discharge at this time.  Patient is given ED precautions to return to the ED for any worsening or new symptoms.     ____________________________________________  FINAL CLINICAL IMPRESSION(S) / ED DIAGNOSES  Final diagnoses:  Vertigo  Anterior neck pain  Eustachian tube dysfunction, left      NEW MEDICATIONS STARTED DURING THIS VISIT:  ED Discharge Orders         Ordered    fluticasone (FLONASE) 50 MCG/ACT nasal spray  2 times daily        05/11/20 1531    cetirizine (ZYRTEC) 10 MG tablet  Daily        05/11/20 1531    meclizine (ANTIVERT) 25 MG tablet  3 times daily PRN        05/11/20 1531              This chart was dictated using voice recognition software/Dragon. Despite best efforts to proofread, errors can occur which can change the meaning. Any change was purely unintentional.    Darletta Moll, PA-C 05/11/20 1548    Vladimir Crofts, MD 05/12/20 1026

## 2020-05-11 NOTE — ED Triage Notes (Addendum)
Pt to ED reporting dizziness and HTN starting today. Dizziness is worse with movement and then calms down after sitting to rest. Pt reports a hx of vertigo but denies this feeling the same. Pt is ambulatory in triage. Pt denies feeling lightheaded and denies CP but reports SOB worse with exertion. Pt has hx of HTN and reports having taken medications as prescribed.   Throat pain also reported in a specific location on the right side of throat that is "tight" in nature. Swelling is pulsation and per pt she has seen multiple doctors for this.

## 2020-05-15 ENCOUNTER — Other Ambulatory Visit: Payer: Self-pay | Admitting: Family Medicine

## 2020-05-15 DIAGNOSIS — I10 Essential (primary) hypertension: Secondary | ICD-10-CM

## 2020-05-26 DIAGNOSIS — G4733 Obstructive sleep apnea (adult) (pediatric): Secondary | ICD-10-CM | POA: Diagnosis not present

## 2020-05-26 DIAGNOSIS — I10 Essential (primary) hypertension: Secondary | ICD-10-CM | POA: Diagnosis not present

## 2020-06-18 ENCOUNTER — Ambulatory Visit (INDEPENDENT_AMBULATORY_CARE_PROVIDER_SITE_OTHER): Payer: Medicare PPO | Admitting: Vascular Surgery

## 2020-06-18 ENCOUNTER — Other Ambulatory Visit (INDEPENDENT_AMBULATORY_CARE_PROVIDER_SITE_OTHER): Payer: Self-pay | Admitting: Vascular Surgery

## 2020-06-18 ENCOUNTER — Encounter (INDEPENDENT_AMBULATORY_CARE_PROVIDER_SITE_OTHER): Payer: Self-pay | Admitting: Vascular Surgery

## 2020-06-18 ENCOUNTER — Ambulatory Visit (INDEPENDENT_AMBULATORY_CARE_PROVIDER_SITE_OTHER): Payer: Medicare PPO

## 2020-06-18 ENCOUNTER — Other Ambulatory Visit: Payer: Self-pay

## 2020-06-18 VITALS — BP 169/77 | HR 60 | Resp 16 | Ht 61.0 in | Wt 173.0 lb

## 2020-06-18 DIAGNOSIS — E7800 Pure hypercholesterolemia, unspecified: Secondary | ICD-10-CM

## 2020-06-18 DIAGNOSIS — H93A1 Pulsatile tinnitus, right ear: Secondary | ICD-10-CM

## 2020-06-18 DIAGNOSIS — R221 Localized swelling, mass and lump, neck: Secondary | ICD-10-CM | POA: Diagnosis not present

## 2020-06-18 DIAGNOSIS — I10 Essential (primary) hypertension: Secondary | ICD-10-CM | POA: Diagnosis not present

## 2020-06-18 NOTE — Assessment & Plan Note (Addendum)
Carotid duplex to evaluate today.  This demonstrated minimal carotid disease and intimal thickening bilaterally with essentially a near normal study.  This is unlikely to be the cause of her symptoms.  We discussed that as we age and our body shortens, the arteries tend to elongate somewhat and create tortuosity and this is likely the cause of her pulsatile neck mass.  This is not pathologic and does not require any treatment and should not be causing any symptoms.  She can return as needed and this can be checked every 2-3 years.

## 2020-06-18 NOTE — Assessment & Plan Note (Signed)
blood pressure control important in reducing the progression of atherosclerotic disease. On appropriate oral medications.  

## 2020-06-18 NOTE — Assessment & Plan Note (Addendum)
Carotid duplex to evaluate.  No significant carotid disease causing the symptoms.

## 2020-06-18 NOTE — Progress Notes (Signed)
Patient ID: Vickie Stewart, female   DOB: [birth year], 75 y.o.   MRN: [id number]  Chief Complaint  Patient presents with  . New Patient (Initial Visit)    Ref [place] pulsatile neck mass    HPI Vickie Stewart is a 75 y.o. female.  I am asked to see the patient by Dr. Allegra Lai for evaluation of a pulsatile neck mass on the right as well as symptoms of pulsatile tinnitus in the right ear.  The patient reports a lot of vague nonspecific symptoms such as shortness of breath, pain in her spine and the back of her neck, and weakness.  She has been diagnosed with anemia and has had a GI work-up.  Her gastroenterologist noticed that her right neck did have a pulsatile area at the base of the right neck just above the clavicle.  The area is not tender to the touch.  Looking at the patient's history, she had a carotid ultrasound several years ago that was relatively unrevealing but that was the last carotid evaluation that I see.  She has not had a previous history of stroke or TIA.  She notices that she hears her heartbeat in her right ear when she lays on that side.  She does not notice this on the left.  She does occasionally have some dizziness and lightheadedness.   Past Medical History:  Diagnosis Date  . Allergic rhinitis, cause unspecified   . Anxiety state, unspecified   . Contact dermatitis and other eczema, due to unspecified cause   . Diffuse cystic mastopathy   . Dyspepsia and other specified disorders of function of stomach   . Essential hypertension, benign   . Glaucoma   . Irritable bowel syndrome   . Lateral epicondylitis  of elbow   . Leukocytopenia, unspecified   . Mastodynia   . Obesity, unspecified   . Pure hypercholesterolemia   . Reflux esophagitis   . Thoracic or lumbosacral neuritis or radiculitis, unspecified   . Unspecified disorder of skin and subcutaneous tissue   . Vaginal cancer [facility])    History    Past Surgical History:  Procedure Laterality Date   . BREAST EXCISIONAL BIOPSY Left    neg  . BREAST LUMPECTOMY Left   . COLONOSCOPY  08/26/2006  . COLONOSCOPY WITH PROPOFOL N/A 10/21/2016   Procedure: COLONOSCOPY WITH PROPOFOL;  Surgeon: Kieth Brightly, MD;  Location: [facility] ENDOSCOPY;  Service: Endoscopy;  Laterality: N/A;  . ESOPHAGOGASTRODUODENOSCOPY (EGD) WITH PROPOFOL N/A 02/07/2020   Procedure: ESOPHAGOGASTRODUODENOSCOPY (EGD) WITH PROPOFOL;  Surgeon: Toney Reil, MD;  Location: [facility] ENDOSCOPY;  Service: Gastroenterology;  Laterality: N/A;    Family History  Problem Relation Age of Onset  . Pulmonary embolism Mother   . Hypertension Father   . Aortic stenosis Father   . Breast cancer Neg Hx   No bleeding disorders   Social History   Tobacco Use  . Smoking status: Never Smoker  . Smokeless tobacco: Never Used  . Tobacco comment: experimented with dip snuff as a child  Vaping Use  . Vaping Use: Never used  Substance Use Topics  . Alcohol use: No  . Drug use: No    Allergies  Allergen Reactions  . Atorvastatin     difficulty concentrating and focusing  . Terbinafine And Related Dermatitis    Rash, itch, skin discoloration    Current Outpatient Medications  Medication Sig Dispense Refill  . aspirin 81 MG tablet Take 81 mg by mouth daily. Reported  on 10/10/2015    . celecoxib (CELEBREX) 200 MG capsule Take 1 capsule (200 mg total) by mouth daily as needed. Once daily 90 capsule 0  . cetirizine (ZYRTEC) 10 MG tablet Take 1 tablet (10 mg total) by mouth daily. 30 tablet 0  . cholecalciferol (VITAMIN D) 1000 units tablet Take 1,000 Units by mouth daily.    Marland Kitchen desonide (DESOWEN) 0.05 % cream Apply topically 2 (two) times daily. 60 g 0  . diltiazem (CARDIZEM CD) 360 MG 24 hr capsule TAKE 1 CAPSULE (360 MG TOTAL) BY MOUTH DAILY. 90 capsule 0  . EPINEPHrine 0.3 mg/0.3 mL IJ SOAJ injection Inject 0.3 mg into the muscle as needed for anaphylaxis. 2 each 0  . fluticasone (FLONASE) 50 MCG/ACT nasal spray Place 1  spray into both nostrils 2 (two) times daily. 16 g 0  . hydrALAZINE (APRESOLINE) 10 MG tablet Take 1 tablet (10 mg total) by mouth 3 (three) times daily. If bp remains above 160/90 30 tablet 0  . latanoprost (XALATAN) 0.005 % ophthalmic solution PLACE 1 DROP IN BOTH EYES AT BEDTIME  4  . meclizine (ANTIVERT) 25 MG tablet Take 1 tablet (25 mg total) by mouth 3 (three) times daily as needed for dizziness. 30 tablet 0  . metaxalone (SKELAXIN) 800 MG tablet Take 1 tablet (800 mg total) by mouth 3 (three) times daily. 90 tablet 0  . Multiple Vitamin (MULTIVITAMIN) tablet Take 1 tablet by mouth daily. Reported on 10/10/2015    . olmesartan (BENICAR) 20 MG tablet Take 1 tablet (20 mg total) by mouth daily. In place of lisinopril hctz 90 tablet 0  . olmesartan (BENICAR) 40 MG tablet TAKE 1 TABLET (40 MG TOTAL) BY MOUTH DAILY. IN PLACE OF LISINOPRIL HCTZ 90 tablet 1  . Omega-3 Fatty Acids (FISH OIL CONCENTRATE PO) Take by mouth. Reported on 10/10/2015    . omeprazole (PRILOSEC) 20 MG capsule Take 1 capsule (20 mg total) by mouth in the morning and at bedtime. 180 capsule 1  . rosuvastatin (CRESTOR) 10 MG tablet Take 1 tablet (10 mg total) by mouth daily. 90 tablet 3  . triamcinolone (KENALOG) 0.1 % Apply topically 2 (two) times daily. 453.6 g 0  . vitamin C (ASCORBIC ACID) 500 MG tablet Take 500 mg by mouth daily. Reported on 10/10/2015    . vitamin E 400 UNIT capsule Take 400 Units by mouth daily. Reported on 10/10/2015     No current facility-administered medications for this visit.      REVIEW OF SYSTEMS (Negative unless checked)  Constitutional: [] Weight loss  [] Fever  [] Chills Cardiac: [] Chest pain   [] Chest pressure   [] Palpitations   [] Shortness of breath when laying flat   [] Shortness of breath at rest   [x] Shortness of breath with exertion. Vascular:  [] Pain in legs with walking   [] Pain in legs at rest   [] Pain in legs when laying flat   [] Claudication   [] Pain in feet when walking  [] Pain in  feet at rest  [] Pain in feet when laying flat   [] History of DVT   [] Phlebitis   [] Swelling in legs   [] Varicose veins   [] Non-healing ulcers Pulmonary:   [] Uses home oxygen   [] Productive cough   [] Hemoptysis   [] Wheeze  [] COPD   [] Asthma Neurologic:  [x] Dizziness  [] Blackouts   [] Seizures   [] History of stroke   [] History of TIA  [] Aphasia   [] Temporary blindness   [] Dysphagia   [] Weakness or numbness in arms   [] Weakness  or numbness in legs Musculoskeletal:  [x] Arthritis   [] Joint swelling   [] Joint pain   [] Low back pain Hematologic:  [] Easy bruising  [] Easy bleeding   [] Hypercoagulable state   [x] Anemic  [] Hepatitis Gastrointestinal:  [] Blood in stool   [] Vomiting blood  [x] Gastroesophageal reflux/heartburn   [] Abdominal pain Genitourinary:  [] Chronic kidney disease   [] Difficult urination  [] Frequent urination  [] Burning with urination   [] Hematuria Skin:  [] Rashes   [] Ulcers   [] Wounds Psychological:  [] History of anxiety   []  History of major depression.    Physical Exam BP (!) 169/77 (BP Location: Right Arm)   Pulse 60   Resp 16   Ht 5\' 1"  (1.549 m)   Wt 173 lb (78.5 kg)   BMI 32.69 kg/m  Gen:  WD/WN, NAD Head: [place]/AT, No temporalis wasting.  Ear/Nose/Throat: Hearing grossly intact, nares w/o erythema or drainage, oropharynx w/o Erythema/Exudate Eyes: Conjunctiva clear, sclera non-icteric  Neck: trachea midline.  Prominent carotid pulses noted in the right lower neck.  No bruit. Pulmonary:  Good air movement, clear to auscultation bilaterally.  Cardiac: RRR, normal S1, S2 Vascular:  Vessel Right Left  Radial Palpable Palpable               Gastrointestinal: soft, non-tender/non-distended.  Musculoskeletal: M/S 5/5 throughout.  Extremities without ischemic changes.  No deformity or atrophy. No edema. Neurologic: Sensation grossly intact in extremities.  Symmetrical.  Speech is fluent. Motor exam as listed above. Psychiatric: Judgment intact, Mood & affect appropriate for  pt's clinical situation. Dermatologic: No rashes or ulcers noted.  No cellulitis or open wounds.    Radiology No results found.  Labs Recent Results (from the past 2160 hour(s))  Allergy Panel 18, [employer]     Status: None   Collection Time: 04/29/20 12:07 PM  Result Value Ref Range   Almonds <0.10 kU/L   CLASS 0    Coconut <0.10 kU/L   CLASS 0    Peanut IgE <0.10 kU/L   Class 0    Pecan Nut <0.10 kU/L   CLASS 0    Sesame Seed f10  <0.10 kU/L   CLASS 0    Hazelnut <0.10 kU/L   CLASS 0    Cashew IgE <0.10 kU/L   CLASS 0   CBC with Differential/Platelet     Status: Abnormal   Collection Time: 04/29/20 12:07 PM  Result Value Ref Range   WBC 3.4 (L) 3.8 - 10.8 Thousand/uL   RBC 4.71 3.80 - 5.10 Million/uL   Hemoglobin 13.9 11.7 - 15.5 g/dL   HCT 41.6 35.0 - 45.0 %   MCV 88.3 80.0 - 100.0 fL   MCH 29.5 27.0 - 33.0 pg   MCHC 33.4 32.0 - 36.0 g/dL   RDW 12.6 11.0 - 15.0 %   Platelets 208 140 - 400 Thousand/uL   MPV 12.2 7.5 - 12.5 fL   Neutro Abs 1,431 (L) 1,500 - 7,800 cells/uL   Lymphs Abs 1,550 850 - 3,900 cells/uL   Absolute Monocytes 258 200 - 950 cells/uL   Eosinophils Absolute 109 15 - 500 cells/uL   Basophils Absolute 51 0 - 200 cells/uL   Neutrophils Relative % 42.1 %   Total Lymphocyte 45.6 %   Monocytes Relative 7.6 %   Eosinophils Relative 3.2 %   Basophils Relative 1.5 %  Interpretation:     Status: None   Collection Time: 04/29/20 12:07 PM  Result Value Ref Range   Interpretation  Comment: . Specific                        Level of Allergen IGE Class      kU/L             Specific IGE Antibody  -----         ---------        -------------------   0              <0.10           Absent/Undetectable   0/1        0.10-0.34           Very Low Level   1          0.35-0.69           Low Level   2          0.70-3.49           Moderate Level   3          3.50-17.4           High Level   4          17.5-49.9           Very High Level   5             50-100            Very High Level   6              >100            Very High Level . The clinical relevance of allergen results of 0.10-0.34 kU/L are undetermined and intended for  specialist use. . Allergens denoted with a "**" include results using one or more analyte specific reagents. In those cases, the test was developed and its analytical performance characteristics have been determined by [employer]. It has not been cleared or approved by the U.S. Food and Drug Administration. This assay  has been v alidated pursuant to the [employer]  and is used for clinical purposes.   Basic metabolic panel     Status: None   Collection Time: 05/11/20 12:48 AM  Result Value Ref Range   Sodium 140 135 - 145 mmol/L   Potassium 3.7 3.5 - 5.1 mmol/L   Chloride 104 98 - 111 mmol/L   CO2 27 22 - 32 mmol/L   Glucose, Bld 97 70 - 99 mg/dL    Comment: Glucose reference range applies only to samples taken after fasting for at least 8 hours.   BUN 21 8 - 23 mg/dL   Creatinine, Ser 0.63 0.44 - 1.00 mg/dL   Calcium 9.7 8.9 - 10.3 mg/dL   GFR, Estimated >60 >60 mL/min    Comment: (NOTE) Calculated using the CKD-EPI Creatinine Equation (2021)    Anion gap 9 5 - 15    Comment: Performed at [facility], [address] [place],  [address]  CBC     Status: None   Collection Time: 05/11/20 12:48 AM  Result Value Ref Range   WBC 4.3 4.0 - 10.5 K/uL   RBC 4.83 3.87 - 5.11 MIL/uL   Hemoglobin 14.2 12.0 - 15.0 g/dL   HCT 42.3 36.0 - 46.0 %   MCV 87.6 80.0 - 100.0 fL   MCH 29.4 26.0 - 34.0 pg   MCHC 33.6 30.0 - 36.0 g/dL   RDW  13.0 11.5 - 15.5 %   Platelets 245 150 - 400 K/uL   nRBC 0.0 0.0 - 0.2 %    Comment: Performed at [facility], [address] [place], [place] [address]  Urinalysis, Complete w Microscopic     Status: Abnormal   Collection Time: 05/11/20 12:48 AM  Result Value Ref Range   Color, Urine COLORLESS (A) YELLOW   APPearance CLEAR (A)  CLEAR   Specific Gravity, Urine 1.002 (L) 1.005 - 1.030   pH 7.0 5.0 - 8.0   Glucose, UA NEGATIVE NEGATIVE mg/dL   Hgb urine dipstick NEGATIVE NEGATIVE   Bilirubin Urine NEGATIVE NEGATIVE   Ketones, ur NEGATIVE NEGATIVE mg/dL   Protein, ur NEGATIVE NEGATIVE mg/dL   Nitrite NEGATIVE NEGATIVE   Leukocytes,Ua NEGATIVE NEGATIVE   RBC / HPF 0-5 0 - 5 RBC/hpf   WBC, UA 0-5 0 - 5 WBC/hpf   Bacteria, UA NONE SEEN NONE SEEN   Squamous Epithelial / LPF NONE SEEN 0 - 5    Comment: Performed at [facility], [place], [place] [address]  Troponin I (High Sensitivity)     Status: None   Collection Time: 05/11/20 12:48 AM  Result Value Ref Range   Troponin I (High Sensitivity) 13 <18 ng/L    Comment: (NOTE) Elevated high sensitivity troponin I (hsTnI) values and significant  changes across serial measurements may suggest ACS but many other  chronic and acute conditions are known to elevate hsTnI results.  Refer to the "Links" section for chest pain algorithms and additional  guidance. Performed at [facility], [place], [place] [address]   Troponin I (High Sensitivity)     Status: None   Collection Time: 05/11/20  3:21 AM  Result Value Ref Range   Troponin I (High Sensitivity) 12 <18 ng/L    Comment: (NOTE) Elevated high sensitivity troponin I (hsTnI) values and significant  changes across serial measurements may suggest ACS but many other  chronic and acute conditions are known to elevate hsTnI results.  Refer to the "Links" section for chest pain algorithms and additional  guidance. Performed at [id number], [address] [place],  [address]     Assessment/Plan:  Pulsatile tinnitus of right ear Carotid duplex to evaluate.  No significant carotid disease causing the symptoms.  Pulsatile neck mass Carotid duplex to evaluate today.  This demonstrated minimal carotid disease and intimal thickening bilaterally with  essentially a near normal study.  This is unlikely to be the cause of her symptoms.  We discussed that as we age and our body shortens, the arteries tend to elongate somewhat and create tortuosity and this is likely the cause of her pulsatile neck mass.  This is not pathologic and does not require any treatment and should not be causing any symptoms.  She can return as needed and this can be checked every 2-3 years.  Hyperlipemia lipid control important in reducing the progression of atherosclerotic disease. Continue statin therapy   Essential hypertension, benign blood pressure control important in reducing the progression of atherosclerotic disease. On appropriate oral medications.       Leotis Pain 06/18/2020, 11:56 AM   This note was created with Dragon medical transcription system.  Any errors from dictation are unintentional.

## 2020-06-18 NOTE — Assessment & Plan Note (Signed)
lipid control important in reducing the progression of atherosclerotic disease. Continue statin therapy  

## 2020-06-26 DIAGNOSIS — G4733 Obstructive sleep apnea (adult) (pediatric): Secondary | ICD-10-CM | POA: Diagnosis not present

## 2020-06-26 DIAGNOSIS — I10 Essential (primary) hypertension: Secondary | ICD-10-CM | POA: Diagnosis not present

## 2020-07-01 ENCOUNTER — Other Ambulatory Visit: Payer: Self-pay | Admitting: Family Medicine

## 2020-07-01 DIAGNOSIS — G8929 Other chronic pain: Secondary | ICD-10-CM

## 2020-07-01 DIAGNOSIS — M549 Dorsalgia, unspecified: Secondary | ICD-10-CM

## 2020-07-09 DIAGNOSIS — H40153 Residual stage of open-angle glaucoma, bilateral: Secondary | ICD-10-CM | POA: Diagnosis not present

## 2020-07-23 ENCOUNTER — Other Ambulatory Visit: Payer: Self-pay | Admitting: Family Medicine

## 2020-07-23 DIAGNOSIS — K219 Gastro-esophageal reflux disease without esophagitis: Secondary | ICD-10-CM

## 2020-07-23 NOTE — Telephone Encounter (Signed)
Requested Prescriptions  Pending Prescriptions Disp Refills  . omeprazole (PRILOSEC) 20 MG capsule [Pharmacy Med Name: OMEPRAZOLE DR 20 MG CAPSULE] 180 capsule 1    Sig: TAKE 1 CAPSULE (20 MG TOTAL) BY MOUTH IN THE MORNING AND AT BEDTIME.     Gastroenterology: Proton Pump Inhibitors Passed - 07/23/2020  1:29 AM      Passed - Valid encounter within last 12 months    Recent Outpatient Visits          2 months ago Essential hypertension, benign   [facility] Steele Sizer, MD   5 months ago Essential hypertension, benign   [facility] [place], Drue Stager, MD   9 months ago OSA (obstructive sleep apnea)   [facility] Steele Sizer, MD   10 months ago Anxiety and depression   [facility] Steele Sizer, MD   11 months ago Well adult exam   [facility] Steele Sizer, MD      Future Appointments            In 3 weeks Steele Sizer, MD [facility], [facility]

## 2020-07-27 DIAGNOSIS — G4733 Obstructive sleep apnea (adult) (pediatric): Secondary | ICD-10-CM | POA: Diagnosis not present

## 2020-07-27 DIAGNOSIS — I10 Essential (primary) hypertension: Secondary | ICD-10-CM | POA: Diagnosis not present

## 2020-07-29 ENCOUNTER — Other Ambulatory Visit: Payer: Self-pay | Admitting: Family Medicine

## 2020-07-29 DIAGNOSIS — G4733 Obstructive sleep apnea (adult) (pediatric): Secondary | ICD-10-CM | POA: Diagnosis not present

## 2020-07-29 DIAGNOSIS — Z1231 Encounter for screening mammogram for malignant neoplasm of breast: Secondary | ICD-10-CM

## 2020-08-06 ENCOUNTER — Other Ambulatory Visit: Payer: Self-pay | Admitting: Family Medicine

## 2020-08-06 DIAGNOSIS — I10 Essential (primary) hypertension: Secondary | ICD-10-CM

## 2020-08-12 NOTE — Progress Notes (Signed)
Name: Vickie Stewart   MRN: [id number]    DOB: December 29, 1945   Date:08/13/2020       Progress Note  Subjective  Chief Complaint  Annual Exam   HPI  Patient presents for annual CPE and follow up  HTN: BP at home has been in the 130 range, today is 130/72  No chest pain or palpitation, she denies sob unless she eats peanut butter - she had allergy testing that was negative. Advised to stay off nuts  Urine Odor: she states about 4 months ago she had some diarrhea followed by urine odor, but she took cranberry juice and felt better, she states currently she has some odor coming from her vagina. She has not been sexually active since she became a widow 10 years ago.   Meningioma: still under the care of Dr. Michaell Cowing visit was Nov 2020. He stated on his note that since asymptomatic and size is stable and small he does not recommend surgery at this time.She states next MRI is due Nov 2022. She had an episode of vertigo back in 05/2020 she went to [facility] and had a CTA done that was negative and lesion was stable She states no episodes since. Sometimes has some dizziness when moving her head in bed, but brief episodes and sporadic, discussed referral to ENT She states she knows how to do the Epley maneuver since seen by ENT in the past   GERD:she is taking omeprazole 20 mg BID , she states symptom are controlled, has indigestion at most once a month   Thyroid nodule: seen by Dr. Gabriel Carina in 2015 no dysphagia, she went back 04/2020 and repeat US just showed a benign cyst left lower lobe. Neck puffs forward when she cough on the right side. She had normal EGD done 01/2020, she states she was sent to vascular surgeon and studies were negative - I cannot see results   Hyperlipidemia: she takes Crestor, denies myalgias. Also taking fish oil. Last LDL was 77, we will recheck level today   Dysthymia : she states she is feeling better, phq 9 is 5, she states more energy and motivation lately. She is  not taking any medications at this time  OSA: she has been wearing CPAP every night and is feeling better, wakes up and has to place mask back on . Unchanged   Neck pain: she had PT but continues to have pain, she states stiffness and has burning sensation that radiates down her shoulders, difficulty sitting for a long time and sometimes it goes down to her lower back.No paresthesias or weakness. Denies bowel or bladder incontinence.She stopped Lyrica and states doing well, only takes celebrex prn and sometimes takes skelaxin prn only. She states she is doing better now   History of vaginal cancer, s/p brachytherapy done at [facility] , last pap smear was done 2021 and normal, she is having some vaginal odor, also some vaginal irritation, she has a history of vaginal atrophy but states it feels different     Diet: balanced Exercise: she has been active   [employer] Visit from 10/20/2019 [facility]  AUDIT-C Score 0     Depression: Phq 9 is  positive Depression screen [facility] 2/9 08/13/2020 04/29/2020 01/26/2020 10/20/2019 09/19/2019  Decreased Interest 0 1 0 0 1  Down, Depressed, Hopeless 0 1 0 0 0  PHQ - 2 Score 0 2 0 0 1  Altered sleeping - 1 1 0 1  Tired, decreased  energy - 1 1 0 1  Change in appetite - 0 1 0 1  Feeling bad or failure about yourself  - 0 0 0 0  Trouble concentrating - 1 1 0 0  Moving slowly or fidgety/restless - 0 0 0 0  Suicidal thoughts - 0 0 0 0  PHQ-9 Score - 5 4 0 4  Difficult doing work/chores - Not difficult at all Not difficult at all Not difficult at all -  Some recent data might be hidden   Hypertension: BP Readings from Last 3 Encounters:  08/13/20 130/72  06/18/20 (!) 169/77  05/11/20 106/63   Obesity: Wt Readings from Last 3 Encounters:  08/13/20 173 lb (78.5 kg)  06/18/20 173 lb (78.5 kg)  05/11/20 169 lb 15.6 oz (77.1 kg)   BMI Readings from Last 3 Encounters:  08/13/20 32.69 kg/m  06/18/20 32.69 kg/m   05/11/20 32.12 kg/m     Vaccines:   Shingrix: up to date  Pneumonia: 07/26/2015 complete, educated and discussed with patient. Flu: 02/24/2020 educated and discussed with patient.  Hep C Screening: 04/26/2013 STD testing and prevention (HIV/chl/gon/syphilis): N/A Intimate partner violence: negative screen  Sexual History : not sexually active  Menstrual History/LMP/Abnormal Bleeding: discussed post-menopausal bleeding  Incontinence Symptoms: she has urinary frequency   Breast cancer:  - Last Mammogram: 10/03/2019 - BRCA gene screening: N/A  Osteoporosis: Discussed high calcium and vitamin D supplementation, weight bearing exercises  Cervical cancer screening: pap smear is up to date, history of vaginal cancer, but pap smear was normal 2021   Skin cancer: Discussed monitoring for atypical lesions  Colorectal cancer: 10/21/2016 Lung cancer:   Low Dose CT Chest recommended if Age 7-80 years, 20 pack-year currently smoking OR have quit w/in 15years. Patient does not qualify.   ECG: 05/13/2020  Advanced Care Planning: A voluntary discussion about advance care planning including the explanation and discussion of advance directives.  Discussed health care proxy and Living will, and the patient was able to identify a health care proxy as brother - Glynn Octave     Lipids: Lab Results  Component Value Date   CHOL 154 08/08/2019   CHOL 134 07/29/2018   CHOL 141 07/09/2017   Lab Results  Component Value Date   HDL 63 08/08/2019   HDL 60 07/29/2018   HDL 59 07/09/2017   Lab Results  Component Value Date   LDLCALC 77 08/08/2019   LDLCALC 61 07/29/2018   LDLCALC 67 07/09/2017   Lab Results  Component Value Date   TRIG 68 08/08/2019   TRIG 58 07/29/2018   TRIG 70 07/09/2017   Lab Results  Component Value Date   CHOLHDL 2.4 08/08/2019   CHOLHDL 2.2 07/29/2018   CHOLHDL 2.4 07/09/2017   No results found for: LDLDIRECT  Glucose: Glucose, Bld  Date Value Ref  Range Status  05/11/2020 97 70 - 99 mg/dL Final    Comment:    Glucose reference range applies only to samples taken after fasting for at least 8 hours.  08/08/2019 79 65 - 99 mg/dL Final    Comment:    .            Fasting reference interval .   07/29/2018 85 65 - 99 mg/dL Final    Comment:    .            Fasting reference interval .     Patient Active Problem List   Diagnosis Date Noted  . Pulsatile tinnitus of right  ear 06/18/2020  . Pulsatile neck mass 06/18/2020  . Burnout of caregiver 09/19/2019  . OSA (obstructive sleep apnea) 09/19/2019  . Anxiety and depression 09/19/2019  . Left thyroid nodule 08/08/2019  . Neck pain 01/03/2019  . Meningioma ([place]) 10/04/2017  . Lichen sclerosus 05/69/7948  . Vaginal atrophy 03/18/2016  . Encounter for follow-up surveillance of vaginal cancer 03/18/2016  . Eczema 10/31/2015  . Left hand pain 09/10/2015  . Osteopenia after menopause 07/26/2015  . Menopause 07/26/2015  . Irritable colon 07/26/2015  . History of cancer of vagina 07/26/2015  . Diffuse cystic mastopathy 07/26/2015  . Obesity (BMI 30.0-34.9) 07/26/2015  . Intermittent low back pain 07/26/2015  . Hyperlipemia 07/26/2015  . Osteoarthritis 05/13/2015  . Allergic rhinitis 05/13/2015  . Chronic GERD 05/13/2015  . Essential hypertension, benign     Past Surgical History:  Procedure Laterality Date  . BREAST EXCISIONAL BIOPSY Left    neg  . BREAST LUMPECTOMY Left   . COLONOSCOPY  08/26/2006  . COLONOSCOPY WITH PROPOFOL N/A 10/21/2016   Procedure: COLONOSCOPY WITH PROPOFOL;  Surgeon: Christene Lye, MD;  Location: [facility] ENDOSCOPY;  Service: Endoscopy;  Laterality: N/A;  . ESOPHAGOGASTRODUODENOSCOPY (EGD) WITH PROPOFOL N/A 02/07/2020   Procedure: ESOPHAGOGASTRODUODENOSCOPY (EGD) WITH PROPOFOL;  Surgeon: Lin Landsman, MD;  Location[facility];  Service: Gastroenterology;  Laterality: N/A;    Family History  Problem Relation Age of Onset  .  Pulmonary embolism Mother   . Hypertension Father   . Aortic stenosis Father   . Breast cancer Neg Hx     Social History   Socioeconomic History  . Marital status: Widowed    Spouse name: Not on file  . Number of children: 0  . Years of education: Not on file  . Highest education level: Some college, no degree  Occupational History  . Occupation: Radio producer: [employer]  Tobacco Use  . Smoking status: Never Smoker  . Smokeless tobacco: Never Used  . Tobacco comment: experimented with dip snuff as a child  Vaping Use  . Vaping Use: Never used  Substance and Sexual Activity  . Alcohol use: No  . Drug use: No  . Sexual activity: Not Currently    Birth control/protection: Post-menopausal  Other Topics Concern  . Not on file  Social History Narrative  . Not on file   Social Determinants of Health   Financial Resource Strain: Low Risk   . Difficulty of Paying Living Expenses: Not hard at all  Food Insecurity: No Food Insecurity  . Worried About Charity fundraiser in the Last Year: Never true  . Ran Out of Food in the Last Year: Never true  Transportation Needs: No Transportation Needs  . Lack of Transportation (Medical): No  . Lack of Transportation (Non-Medical): No  Physical Activity: Sufficiently Active  . Days of Exercise per Week: 6 days  . Minutes of Exercise per Session: 30 min  Stress: No Stress Concern Present  . Feeling of Stress : Only a little  Social Connections: Moderately Integrated  . Frequency of Communication with Friends and Family: Three times a week  . Frequency of Social Gatherings with Friends and Family: Twice a week  . Attends Religious Services: More than 4 times per year  . Active Member of Clubs or Organizations: Yes  . Attends Archivist Meetings: 1 to 4 times per year  . Marital Status: Widowed  Intimate Partner Violence: Not At Risk  . Fear of Current  or Ex-Partner: No  . Emotionally Abused: No  . Physically Abused: No   . Sexually Abused: No     Current Outpatient Medications:  .  aspirin 81 MG tablet, Take 81 mg by mouth daily. Reported on 10/10/2015, Disp: , Rfl:  .  celecoxib (CELEBREX) 200 MG capsule, TAKE 1 CAPSULE BY MOUTH TWICE A DAY, Disp: 90 capsule, Rfl: 0 .  cetirizine (ZYRTEC) 10 MG tablet, Take 1 tablet (10 mg total) by mouth daily., Disp: 30 tablet, Rfl: 0 .  cholecalciferol (VITAMIN D) 1000 units tablet, Take 1,000 Units by mouth daily., Disp: , Rfl:  .  desonide (DESOWEN) 0.05 % cream, Apply topically 2 (two) times daily., Disp: 60 g, Rfl: 0 .  diltiazem (CARDIZEM CD) 360 MG 24 hr capsule, TAKE 1 CAPSULE BY MOUTH EVERY DAY, Disp: 90 capsule, Rfl: 0 .  EPINEPHrine 0.3 mg/0.3 mL IJ SOAJ injection, Inject 0.3 mg into the muscle as needed for anaphylaxis., Disp: 2 each, Rfl: 0 .  fluticasone (FLONASE) 50 MCG/ACT nasal spray, Place 1 spray into both nostrils 2 (two) times daily., Disp: 16 g, Rfl: 0 .  hydrALAZINE (APRESOLINE) 10 MG tablet, Take 1 tablet (10 mg total) by mouth 3 (three) times daily. If bp remains above 160/90, Disp: 30 tablet, Rfl: 0 .  latanoprost (XALATAN) 0.005 % ophthalmic solution, PLACE 1 DROP IN BOTH EYES AT BEDTIME, Disp: , Rfl: 4 .  meclizine (ANTIVERT) 25 MG tablet, Take 1 tablet (25 mg total) by mouth 3 (three) times daily as needed for dizziness., Disp: 30 tablet, Rfl: 0 .  metaxalone (SKELAXIN) 800 MG tablet, Take 1 tablet (800 mg total) by mouth 3 (three) times daily., Disp: 90 tablet, Rfl: 0 .  Multiple Vitamin (MULTIVITAMIN) tablet, Take 1 tablet by mouth daily. Reported on 10/10/2015, Disp: , Rfl:  .  olmesartan (BENICAR) 20 MG tablet, Take 1 tablet (20 mg total) by mouth daily. In place of lisinopril hctz, Disp: 90 tablet, Rfl: 0 .  olmesartan (BENICAR) 40 MG tablet, TAKE 1 TABLET (40 MG TOTAL) BY MOUTH DAILY. IN PLACE OF LISINOPRIL HCTZ, Disp: 90 tablet, Rfl: 1 .  Omega-3 Fatty Acids (FISH OIL CONCENTRATE PO), Take by mouth. Reported on 10/10/2015, Disp: , Rfl:  .   omeprazole (PRILOSEC) 20 MG capsule, TAKE 1 CAPSULE (20 MG TOTAL) BY MOUTH IN THE MORNING AND AT BEDTIME., Disp: 180 capsule, Rfl: 1 .  rosuvastatin (CRESTOR) 10 MG tablet, Take 1 tablet (10 mg total) by mouth daily., Disp: 90 tablet, Rfl: 3 .  triamcinolone (KENALOG) 0.1 %, Apply topically 2 (two) times daily., Disp: 453.6 g, Rfl: 0 .  vitamin C (ASCORBIC ACID) 500 MG tablet, Take 500 mg by mouth daily. Reported on 10/10/2015, Disp: , Rfl:  .  vitamin E 400 UNIT capsule, Take 400 Units by mouth daily. Reported on 10/10/2015, Disp: , Rfl:   Allergies  Allergen Reactions  . Atorvastatin     difficulty concentrating and focusing  . Terbinafine And Related Dermatitis    Rash, itch, skin discoloration     ROS  Constitutional: Negative for fever or weight change.  Respiratory: Negative for cough and shortness of breath.   Cardiovascular: Negative for chest pain or palpitations.  Gastrointestinal: Negative for abdominal pain, no bowel changes.  Musculoskeletal: Negative for gait problem or joint swelling.  Skin: Negative for rash.  Neurological: positive  for intermittent dizziness but  no  headache.  No other specific complaints in a complete review of systems (except as listed in  HPI above).  Objective  Vitals:   08/13/20 1054  BP: 130/72  Pulse: 64  Resp: 16  Temp: 98 F (36.7 C)  TempSrc: Oral  SpO2: 98%  Weight: 173 lb (78.5 kg)  Height: _0  (1.549 m)    Body mass index is 32.69 kg/m.  Physical Exam  Constitutional: Patient appears well-developed and well-nourished. No distress.  HENT: Head: Normocephalic and atraumatic. Ears: B TMs ok, no erythema or effusion; Nose: Not done Mouth/Throat:  not done  Eyes: Conjunctivae and EOM are normal. Pupils are equal, round, and reactive to light. No scleral icterus.  Neck: Normal range of motion. Neck supple. No JVD present. No thyromegaly present. bulging area on right side when she coughs, no masses felt  Cardiovascular:  Normal rate, regular rhythm and normal heart sounds.  No murmur heard. No BLE edema. Pulmonary/Chest: Effort normal and breath sounds normal. No respiratory distress. Abdominal: Soft. Bowel sounds are normal, no distension. There is no tenderness. no masses Breast: no lumps or masses, no nipple discharge or rashes FEMALE GENITALIA:  External genitalia normal External urethra normal Vaginal leopard spots near urethra, also atrophy on vaginal introitus, but not deep in the vagina.  Cervix normal without discharge or lesions Bimanual exam normal without masses RECTAL: not done  Musculoskeletal: Normal range of motion, no joint effusions. No gross deformities Neurological: he is alert and oriented to person, place, and time. No cranial nerve deficit. Coordination, balance, strength, speech and gait are normal.  Skin: Skin is warm and dry. No rash noted. No erythema.  Psychiatric: Patient has a normal mood and affect. behavior is normal. Judgment and thought content normal.  Recent Results (from the past 2160 hour(s))  POCT Urinalysis Dipstick     Status: Normal   Collection Time: 08/13/20 11:10 AM  Result Value Ref Range   Color, UA Yellow    Clarity, UA Clear    Glucose, UA Negative Negative   Bilirubin, UA Negative    Ketones, UA Negative    Spec Grav, UA 1.015 1.010 - 1.025   Blood, UA Negative    pH, UA 6.0 5.0 - 8.0   Protein, UA Negative Negative   Urobilinogen, UA 0.2 0.2 or 1.0 E.U./dL   Nitrite, UA Negative    Leukocytes, UA Negative Negative   Appearance     Odor        Fall Risk: Fall Risk  08/13/2020 04/29/2020 01/26/2020 10/20/2019 09/19/2019  Falls in the past year? 1 0 0 0 0  Number falls in past yr: 0 0 0 0 0  Injury with Fall? 0 0 0 0 0  Follow up - - - Falls evaluation completed -     Functional Status Survey: Is the patient deaf or have difficulty hearing?: No Does the patient have difficulty seeing, even when wearing glasses/contacts?: No Does the patient  have difficulty concentrating, remembering, or making decisions?: Yes Does the patient have difficulty walking or climbing stairs?: No Does the patient have difficulty dressing or bathing?: No Does the patient have difficulty doing errands alone such as visiting a doctor's office or shopping?: No   Assessment & Plan   1. Urinary frequency  - POCT Urinalysis Dipstick  2. Well adult exam   3. Vaginal odor  - Cervicovaginal ancillary only  4. Meningioma ([place])   5. Gastroesophageal reflux disease without esophagitis   6. Pure hypercholesterolemia  - Lipid panel - COMPLETE METABOLIC PANEL WITH GFR  7. Essential hypertension, benign  - CBC  with Differential/Platelet - COMPLETE METABOLIC PANEL WITH GFR  8. OSA (obstructive sleep apnea)  - CBC with Differential/Platelet  9. Anxiety and depression   10. Elevated red blood cell count   11. Chronic neck pain   12. Encounter for screening mammogram for breast cancer  - MM 3D SCREEN BREAST BILATERAL; Future  13. Vaginal atrophy  - Cytology - PAP  14. Vaginal lesion  - Cytology - PAP  15. History of cancer of vagina  - Cytology - PAP  -USPSTF grade A and B recommendations reviewed with patient; age-appropriate recommendations, preventive care, screening tests, etc discussed and encouraged; healthy living encouraged; see AVS for patient education given to patient -Discussed importance of 150 minutes of physical activity weekly, eat two servings of fish weekly, eat one serving of tree nuts ( cashews, pistachios, pecans, almonds.Marland Kitchen) every other day, eat 6 servings of fruit/vegetables daily and drink plenty of water and avoid sweet beverages.

## 2020-08-13 ENCOUNTER — Ambulatory Visit (INDEPENDENT_AMBULATORY_CARE_PROVIDER_SITE_OTHER): Payer: Medicare PPO | Admitting: Family Medicine

## 2020-08-13 ENCOUNTER — Encounter: Payer: Self-pay | Admitting: Family Medicine

## 2020-08-13 ENCOUNTER — Other Ambulatory Visit: Payer: Self-pay

## 2020-08-13 ENCOUNTER — Other Ambulatory Visit (HOSPITAL_COMMUNITY)
Admission: RE | Admit: 2020-08-13 | Discharge: 2020-08-13 | Disposition: A | Discharge status: Home / Self Care | Payer: Medicare PPO | Source: Ambulatory Visit | Attending: Family Medicine | Admitting: Family Medicine

## 2020-08-13 VITALS — BP 130/72 | HR 64 | Temp 98.0°F | Resp 16 | Ht 61.0 in | Wt 173.0 lb

## 2020-08-13 DIAGNOSIS — K219 Gastro-esophageal reflux disease without esophagitis: Secondary | ICD-10-CM | POA: Diagnosis not present

## 2020-08-13 DIAGNOSIS — Z0000 Encounter for general adult medical examination without abnormal findings: Secondary | ICD-10-CM

## 2020-08-13 DIAGNOSIS — Z8544 Personal history of malignant neoplasm of other female genital organs: Secondary | ICD-10-CM

## 2020-08-13 DIAGNOSIS — E7800 Pure hypercholesterolemia, unspecified: Secondary | ICD-10-CM

## 2020-08-13 DIAGNOSIS — Z1231 Encounter for screening mammogram for malignant neoplasm of breast: Secondary | ICD-10-CM

## 2020-08-13 DIAGNOSIS — R350 Frequency of micturition: Secondary | ICD-10-CM | POA: Diagnosis not present

## 2020-08-13 DIAGNOSIS — N898 Other specified noninflammatory disorders of vagina: Secondary | ICD-10-CM | POA: Insufficient documentation

## 2020-08-13 DIAGNOSIS — F419 Anxiety disorder, unspecified: Secondary | ICD-10-CM | POA: Diagnosis not present

## 2020-08-13 DIAGNOSIS — G4733 Obstructive sleep apnea (adult) (pediatric): Secondary | ICD-10-CM

## 2020-08-13 DIAGNOSIS — Z1151 Encounter for screening for human papillomavirus (HPV): Secondary | ICD-10-CM | POA: Insufficient documentation

## 2020-08-13 DIAGNOSIS — N952 Postmenopausal atrophic vaginitis: Secondary | ICD-10-CM | POA: Diagnosis not present

## 2020-08-13 DIAGNOSIS — Z01419 Encounter for gynecological examination (general) (routine) without abnormal findings: Secondary | ICD-10-CM | POA: Diagnosis not present

## 2020-08-13 DIAGNOSIS — M542 Cervicalgia: Secondary | ICD-10-CM

## 2020-08-13 DIAGNOSIS — I10 Essential (primary) hypertension: Secondary | ICD-10-CM

## 2020-08-13 DIAGNOSIS — R718 Other abnormality of red blood cells: Secondary | ICD-10-CM

## 2020-08-13 DIAGNOSIS — F32A Depression, unspecified: Secondary | ICD-10-CM

## 2020-08-13 DIAGNOSIS — D329 Benign neoplasm of meninges, unspecified: Secondary | ICD-10-CM | POA: Diagnosis not present

## 2020-08-13 DIAGNOSIS — G8929 Other chronic pain: Secondary | ICD-10-CM

## 2020-08-13 LAB — POCT URINALYSIS DIPSTICK
Bilirubin, UA: NEGATIVE
Blood, UA: NEGATIVE
Glucose, UA: NEGATIVE
Ketones, UA: NEGATIVE
Leukocytes, UA: NEGATIVE
Nitrite, UA: NEGATIVE
Protein, UA: NEGATIVE
Spec Grav, UA: 1.015 (ref 1.010–1.025)
Urobilinogen, UA: 0.2 E.U./dL
pH, UA: 6 (ref 5.0–8.0)

## 2020-08-13 NOTE — Patient Instructions (Signed)
Preventive Care 61 Years and Older, Female Preventive care refers to lifestyle choices and visits with your health care provider that can promote health and wellness. This includes:  A yearly physical exam. This is also called an annual wellness visit.  Regular dental and eye exams.  Immunizations.  Screening for certain conditions.  Healthy lifestyle choices, such as: ? Eating a healthy diet. ? Getting regular exercise. ? Not using drugs or products that contain nicotine and tobacco. ? Limiting alcohol use. What can I expect for my preventive care visit? Physical exam Your health care provider will check your:  Height and weight. These may be used to calculate your BMI (body mass index). BMI is a measurement that tells if you are at a healthy weight.  Heart rate and blood pressure.  Body temperature.  Skin for abnormal spots. Counseling Your health care provider may ask you questions about your:  Past medical problems.  Family's medical history.  Alcohol, tobacco, and drug use.  Emotional well-being.  Home life and relationship well-being.  Sexual activity.  Diet, exercise, and sleep habits.  History of falls.  Memory and ability to understand (cognition).  Work and work Statistician.  Pregnancy and menstrual history.  Access to firearms. What immunizations do I need? Vaccines are usually given at various ages, according to a schedule. Your health care provider will recommend vaccines for you based on your age, medical history, and lifestyle or other factors, such as travel or where you work.   What tests do I need? Blood tests  Lipid and cholesterol levels. These may be checked every 5 years, or more often depending on your overall health.  Hepatitis C test.  Hepatitis B test. Screening  Lung cancer screening. You may have this screening every year starting at age 74 if you have a 30-pack-year history of smoking and currently smoke or have quit within  the past 15 years.  Colorectal cancer screening. ? All adults should have this screening starting at age 44 and continuing until age 58. ? Your health care provider may recommend screening at age 2 if you are at increased risk. ? You will have tests every 1-10 years, depending on your results and the type of screening test.  Diabetes screening. ? This is done by checking your blood sugar (glucose) after you have not eaten for a while (fasting). ? You may have this done every 1-3 years.  Mammogram. ? This may be done every 1-2 years. ? Talk with your health care provider about how often you should have regular mammograms.  Abdominal aortic aneurysm (AAA) screening. You may need this if you are a current or former smoker.  BRCA-related cancer screening. This may be done if you have a family history of breast, ovarian, tubal, or peritoneal cancers. Other tests  STD (sexually transmitted disease) testing, if you are at risk.  Bone density scan. This is done to screen for osteoporosis. You may have this done starting at age 104. Talk with your health care provider about your test results, treatment options, and if necessary, the need for more tests. Follow these instructions at home: Eating and drinking  Eat a diet that includes fresh fruits and vegetables, whole grains, lean protein, and low-fat dairy products. Limit your intake of foods with high amounts of sugar, saturated fats, and salt.  Take vitamin and mineral supplements as recommended by your health care provider.  Do not drink alcohol if your health care provider tells you not to drink.  If you drink alcohol: ? Limit how much you have to 0-1 drink a day. ? Be aware of how much alcohol is in your drink. In the [place] one drink equals one 12 oz bottle of beer (355 mL), one 5 oz glass of wine (148 mL), or one 1 oz glass of hard liquor (44 mL).   Lifestyle  Take daily care of your teeth and gums. Brush your teeth every morning  and night with fluoride toothpaste. Floss one time each day.  Stay active. Exercise for at least 30 minutes 5 or more days each week.  Do not use any products that contain nicotine or tobacco, such as cigarettes, e-cigarettes, and chewing tobacco. If you need help quitting, ask your health care provider.  Do not use drugs.  If you are sexually active, practice safe sex. Use a condom or other form of protection in order to prevent STIs (sexually transmitted infections).  Talk with your health care provider about taking a low-dose aspirin or statin.  Find healthy ways to cope with stress, such as: ? Meditation, yoga, or listening to music. ? Journaling. ? Talking to a trusted person. ? Spending time with friends and family. Safety  Always wear your seat belt while driving or riding in a vehicle.  Do not drive: ? If you have been drinking alcohol. Do not ride with someone who has been drinking. ? When you are tired or distracted. ? While texting.  Wear a helmet and other protective equipment during sports activities.  If you have firearms in your house, make sure you follow all gun safety procedures. What's next?  Visit your health care provider once a year for an annual wellness visit.  Ask your health care provider how often you should have your eyes and teeth checked.  Stay up to date on all vaccines. This information is not intended to replace advice given to you by your health care provider. Make sure you discuss any questions you have with your health care provider. Document Revised: 05/08/2020 Document Reviewed: 05/12/2018 Elsevier Patient Education  2021 Elsevier Inc.  

## 2020-08-14 LAB — CERVICOVAGINAL ANCILLARY ONLY
Bacterial Vaginitis (gardnerella): NEGATIVE
Candida Glabrata: NEGATIVE
Candida Vaginitis: NEGATIVE
Comment: NEGATIVE
Comment: NEGATIVE
Comment: NEGATIVE

## 2020-08-15 LAB — CULTURE, URINE COMPREHENSIVE
MICRO NUMBER:: 11651591
RESULT:: NO GROWTH
SPECIMEN QUALITY:: ADEQUATE

## 2020-08-15 LAB — LIPID PANEL
Cholesterol: 156 mg/dL (ref <200)
HDL: 64 mg/dL (ref >=50)
LDL Cholesterol (Calc): 77 mg/dL (calc)
Non-HDL Cholesterol (Calc): 92 mg/dL (calc) (ref <130)
Total CHOL/HDL Ratio: 2.4 (calc) (ref <5.0)
Triglycerides: 66 mg/dL (ref <150)

## 2020-08-15 LAB — CBC WITH DIFFERENTIAL/PLATELET
Absolute Monocytes: 327 cells/uL (ref 200–950)
Basophils Absolute: 59 cells/uL (ref 0–200)
Basophils Relative: 1.8 %
Eosinophils Absolute: 119 cells/uL (ref 15–500)
Eosinophils Relative: 3.6 %
HCT: 43.8 % (ref 35.0–45.0)
Hemoglobin: 14.5 g/dL (ref 11.7–15.5)
Lymphs Abs: 1610 cells/uL (ref 850–3900)
MCH: 29.1 pg (ref 27.0–33.0)
MCHC: 33.1 g/dL (ref 32.0–36.0)
MCV: 87.8 fL (ref 80.0–100.0)
MPV: 11 fL (ref 7.5–12.5)
Monocytes Relative: 9.9 %
Neutro Abs: 1185 cells/uL — ABNORMAL LOW (ref 1500–7800)
Neutrophils Relative %: 35.9 %
Platelets: 244 10*3/uL (ref 140–400)
RBC: 4.99 10*6/uL (ref 3.80–5.10)
RDW: 12.8 % (ref 11.0–15.0)
Total Lymphocyte: 48.8 %
WBC: 3.3 10*3/uL — ABNORMAL LOW (ref 3.8–10.8)

## 2020-08-15 LAB — COMPLETE METABOLIC PANEL WITH GFR
AG Ratio: 2.1 (calc) (ref 1.0–2.5)
ALT: 17 U/L (ref 6–29)
AST: 20 U/L (ref 10–35)
Albumin: 4.8 g/dL (ref 3.6–5.1)
Alkaline phosphatase (APISO): 51 U/L (ref 37–153)
BUN: 19 mg/dL (ref 7–25)
CO2: 30 mmol/L (ref 20–32)
Calcium: 10 mg/dL (ref 8.6–10.4)
Chloride: 103 mmol/L (ref 98–110)
Creat: 0.78 mg/dL (ref 0.60–0.93)
GFR, Est African American: 88 mL/min/{1.73_m2} (ref >=60)
GFR, Est Non African American: 76 mL/min/{1.73_m2} (ref >=60)
Globulin: 2.3 g/dL (calc) (ref 1.9–3.7)
Glucose, Bld: 81 mg/dL (ref 65–99)
Potassium: 4.1 mmol/L (ref 3.5–5.3)
Sodium: 140 mmol/L (ref 135–146)
Total Bilirubin: 0.5 mg/dL (ref 0.2–1.2)
Total Protein: 7.1 g/dL (ref 6.1–8.1)

## 2020-08-15 LAB — CYTOLOGY - PAP
Comment: NEGATIVE
Diagnosis: NEGATIVE
High risk HPV: NEGATIVE

## 2020-08-24 DIAGNOSIS — I10 Essential (primary) hypertension: Secondary | ICD-10-CM | POA: Diagnosis not present

## 2020-08-24 DIAGNOSIS — G4733 Obstructive sleep apnea (adult) (pediatric): Secondary | ICD-10-CM | POA: Diagnosis not present

## 2020-09-24 ENCOUNTER — Other Ambulatory Visit: Payer: Self-pay | Admitting: Family Medicine

## 2020-09-24 DIAGNOSIS — E7800 Pure hypercholesterolemia, unspecified: Secondary | ICD-10-CM

## 2020-09-24 NOTE — Telephone Encounter (Signed)
Requested medications are due for refill today.  yes  Requested medications are on the active medications list.  yes  Last refill. 08/08/2019  Future visit scheduled.   yes  Notes to clinic.  Rx is expired.

## 2020-09-24 NOTE — Telephone Encounter (Signed)
Pt has an appt on 02/15/21

## 2020-10-03 ENCOUNTER — Other Ambulatory Visit: Payer: Self-pay

## 2020-10-03 ENCOUNTER — Ambulatory Visit
Admission: RE | Admit: 2020-10-03 | Discharge: 2020-10-03 | Disposition: A | Discharge status: Home / Self Care | Payer: Medicare PPO | Source: Ambulatory Visit | Attending: Family Medicine | Admitting: Family Medicine

## 2020-10-03 DIAGNOSIS — Z1231 Encounter for screening mammogram for malignant neoplasm of breast: Secondary | ICD-10-CM | POA: Insufficient documentation

## 2020-10-15 ENCOUNTER — Ambulatory Visit: Payer: Medicare PPO | Attending: Internal Medicine

## 2020-10-15 ENCOUNTER — Other Ambulatory Visit: Payer: Self-pay

## 2020-10-15 DIAGNOSIS — Z23 Encounter for immunization: Secondary | ICD-10-CM

## 2020-10-15 MED ORDER — PFIZER-BIONT COVID-19 VAC-TRIS 30 MCG/0.3ML IM SUSP
INTRAMUSCULAR | 0 refills | Status: DC
  Filled 2020-10-15: qty 0.3, 1d supply, fill #0

## 2020-10-15 NOTE — Progress Notes (Signed)
   Covid-19 Vaccination Clinic  Name:  Vickie Stewart    MRN: [id number] DOB: 31-Jan-1946  10/15/2020  [name] was observed post Covid-19 immunization for 15 minutes without incident. She was provided with Vaccine Information Sheet and instruction to access the V-Safe system.   [name] was instructed to call 911 with any severe reactions post vaccine: Marland Kitchen Difficulty breathing  . Swelling of face and throat  . A fast heartbeat  . A bad rash all over body  . Dizziness and weakness   Immunizations Administered    Name Date Dose VIS Date Route   PFIZER Comrnaty(Gray TOP) Covid-19 Vaccine 10/15/2020 12:35 PM 0.3 mL 05/09/2020 Intramuscular   Manufacturer: [place]   Lot: [id number]   NDC: [place], PharmD, MBA Clinical Acute Care Pharmacist

## 2020-10-24 DIAGNOSIS — I10 Essential (primary) hypertension: Secondary | ICD-10-CM | POA: Diagnosis not present

## 2020-10-24 DIAGNOSIS — G4733 Obstructive sleep apnea (adult) (pediatric): Secondary | ICD-10-CM | POA: Diagnosis not present

## 2020-10-29 DIAGNOSIS — G4733 Obstructive sleep apnea (adult) (pediatric): Secondary | ICD-10-CM | POA: Diagnosis not present

## 2020-11-03 ENCOUNTER — Other Ambulatory Visit: Payer: Self-pay | Admitting: Family Medicine

## 2020-11-03 DIAGNOSIS — I10 Essential (primary) hypertension: Secondary | ICD-10-CM

## 2020-11-03 NOTE — Telephone Encounter (Signed)
Requested Prescriptions  Pending Prescriptions Disp Refills  . diltiazem (CARDIZEM CD) 360 MG 24 hr capsule [Pharmacy Med Name: DILTIAZEM 24H ER(CD) 360 MG CP] 90 capsule 0    Sig: TAKE 1 CAPSULE BY MOUTH EVERY DAY     Cardiovascular:  Calcium Channel Blockers Passed - 11/03/2020 12:54 AM      Passed - Last BP in normal range    BP Readings from Last 1 Encounters:  08/13/20 130/72         Passed - Valid encounter within last 6 months    Recent Outpatient Visits          2 months ago Urinary frequency   [facility] Steele Sizer, MD   6 months ago Essential hypertension, benign   [facility] Steele Sizer, MD   9 months ago Essential hypertension, benign   [facility] Steele Sizer, MD   1 year ago OSA (obstructive sleep apnea)   [facility] Steele Sizer, MD   1 year ago Anxiety and depression   [facility] Steele Sizer, MD      Future Appointments            In 3 months Ancil Boozer, Drue Stager, MD [facility], [facility]

## 2020-11-21 ENCOUNTER — Other Ambulatory Visit: Payer: Self-pay

## 2020-11-21 ENCOUNTER — Telehealth: Payer: Self-pay

## 2020-11-21 DIAGNOSIS — R42 Dizziness and giddiness: Secondary | ICD-10-CM

## 2020-11-21 NOTE — Telephone Encounter (Signed)
Copied from [place] [id number]. Topic: Referral - Request for Referral >> Nov 21, 2020 11:20 AM Tessa Lerner A wrote: Has patient seen PCP for this complaint? Yes.    *If NO, is insurance requiring patient see PCP for this issue before PCP can refer them?  Referral for which specialty: ENT  Preferred provider/office: [facility]   Reason for referral: Vertigo Concerns

## 2020-11-24 DIAGNOSIS — G4733 Obstructive sleep apnea (adult) (pediatric): Secondary | ICD-10-CM | POA: Diagnosis not present

## 2020-11-24 DIAGNOSIS — I10 Essential (primary) hypertension: Secondary | ICD-10-CM | POA: Diagnosis not present

## 2020-11-25 ENCOUNTER — Other Ambulatory Visit: Payer: Self-pay | Admitting: Family Medicine

## 2020-11-25 DIAGNOSIS — H93A1 Pulsatile tinnitus, right ear: Secondary | ICD-10-CM

## 2020-11-29 DIAGNOSIS — H6502 Acute serous otitis media, left ear: Secondary | ICD-10-CM | POA: Diagnosis not present

## 2020-12-16 DIAGNOSIS — H903 Sensorineural hearing loss, bilateral: Secondary | ICD-10-CM | POA: Diagnosis not present

## 2020-12-16 DIAGNOSIS — R42 Dizziness and giddiness: Secondary | ICD-10-CM | POA: Diagnosis not present

## 2020-12-23 DIAGNOSIS — R42 Dizziness and giddiness: Secondary | ICD-10-CM | POA: Diagnosis not present

## 2020-12-24 DIAGNOSIS — I10 Essential (primary) hypertension: Secondary | ICD-10-CM | POA: Diagnosis not present

## 2020-12-24 DIAGNOSIS — G4733 Obstructive sleep apnea (adult) (pediatric): Secondary | ICD-10-CM | POA: Diagnosis not present

## 2021-01-20 ENCOUNTER — Other Ambulatory Visit: Payer: Self-pay | Admitting: Family Medicine

## 2021-01-20 DIAGNOSIS — I10 Essential (primary) hypertension: Secondary | ICD-10-CM

## 2021-01-20 NOTE — Telephone Encounter (Signed)
Next appt is 02/18/21

## 2021-01-20 NOTE — Telephone Encounter (Signed)
Requested medication (s) are due for refill today: Yes  Requested medication (s) are on the active medication list: Yes  Last refill:  01/26/20  Future visit scheduled: Yes  Notes to clinic:  Unable to refill per protocol, diagnosis code needed.     Requested Prescriptions  Pending Prescriptions Disp Refills   olmesartan (BENICAR) 40 MG tablet [Pharmacy Med Name: OLMESARTAN MEDOXOMIL 40 MG TAB] 90 tablet 1    Sig: TAKE 1 TABLET (40 MG TOTAL) BY MOUTH DAILY. IN PLACE OF LISINOPRIL HCTZ     Cardiovascular:  Angiotensin Receptor Blockers Passed - 01/20/2021  8:37 AM      Passed - Cr in normal range and within 180 days    Creat  Date Value Ref Range Status  08/13/2020 0.78 0.60 - 0.93 mg/dL Final    Comment:    For patients >68 years of age, the reference limit for Creatinine is approximately 13% higher for people identified as African-American. .           Passed - K in normal range and within 180 days    Potassium  Date Value Ref Range Status  08/13/2020 4.1 3.5 - 5.3 mmol/L Final          Passed - Patient is not pregnant      Passed - Last BP in normal range    BP Readings from Last 1 Encounters:  08/13/20 130/72          Passed - Valid encounter within last 6 months    Recent Outpatient Visits           5 months ago Urinary frequency   [facility] Steele Sizer, MD   8 months ago Essential hypertension, benign   [facility] Steele Sizer, MD   12 months ago Essential hypertension, benign   [facility] Steele Sizer, MD   1 year ago OSA (obstructive sleep apnea)   [facility] Steele Sizer, MD   1 year ago Anxiety and depression   [facility] Steele Sizer, MD       Future Appointments             In 4 weeks Steele Sizer, MD [facility], [facility]

## 2021-01-21 DIAGNOSIS — H40153 Residual stage of open-angle glaucoma, bilateral: Secondary | ICD-10-CM | POA: Diagnosis not present

## 2021-01-25 ENCOUNTER — Other Ambulatory Visit: Payer: Self-pay | Admitting: Family Medicine

## 2021-01-25 DIAGNOSIS — I10 Essential (primary) hypertension: Secondary | ICD-10-CM

## 2021-01-28 DIAGNOSIS — G4733 Obstructive sleep apnea (adult) (pediatric): Secondary | ICD-10-CM | POA: Diagnosis not present

## 2021-02-14 ENCOUNTER — Ambulatory Visit: Payer: Medicare PPO | Admitting: Family Medicine

## 2021-02-18 ENCOUNTER — Ambulatory Visit: Payer: Medicare PPO | Admitting: Family Medicine

## 2021-02-28 DIAGNOSIS — E669 Obesity, unspecified: Secondary | ICD-10-CM | POA: Diagnosis not present

## 2021-02-28 DIAGNOSIS — D329 Benign neoplasm of meninges, unspecified: Secondary | ICD-10-CM | POA: Diagnosis not present

## 2021-02-28 DIAGNOSIS — R202 Paresthesia of skin: Secondary | ICD-10-CM | POA: Diagnosis not present

## 2021-02-28 DIAGNOSIS — Z6834 Body mass index (BMI) 34.0-34.9, adult: Secondary | ICD-10-CM | POA: Diagnosis not present

## 2021-02-28 DIAGNOSIS — R2689 Other abnormalities of gait and mobility: Secondary | ICD-10-CM | POA: Diagnosis not present

## 2021-02-28 DIAGNOSIS — E668 Other obesity: Secondary | ICD-10-CM | POA: Diagnosis not present

## 2021-02-28 DIAGNOSIS — G4733 Obstructive sleep apnea (adult) (pediatric): Secondary | ICD-10-CM | POA: Diagnosis not present

## 2021-02-28 DIAGNOSIS — M5481 Occipital neuralgia: Secondary | ICD-10-CM | POA: Diagnosis not present

## 2021-03-03 ENCOUNTER — Other Ambulatory Visit (HOSPITAL_COMMUNITY): Payer: Self-pay | Admitting: Neurosurgery

## 2021-03-03 ENCOUNTER — Other Ambulatory Visit: Payer: Self-pay | Admitting: Neurosurgery

## 2021-03-03 ENCOUNTER — Telehealth: Payer: Self-pay

## 2021-03-03 DIAGNOSIS — D329 Benign neoplasm of meninges, unspecified: Secondary | ICD-10-CM

## 2021-03-03 NOTE — Telephone Encounter (Signed)
Lvm to inform pt that we do not do the booster here and she could call her pharmacy    Copied from [place] [phone]. Topic: Appointment Scheduling - Scheduling Inquiry for Clinic >> Mar 03, 2021  8:20 AM Valere Dross wrote: Reason for CRM: Pt wants to see about getting booster scheduled and wanted to speak with PCP. Please advise.

## 2021-03-05 ENCOUNTER — Encounter: Payer: Self-pay | Admitting: Family Medicine

## 2021-03-05 ENCOUNTER — Ambulatory Visit: Payer: Medicare PPO | Admitting: Family Medicine

## 2021-03-05 ENCOUNTER — Other Ambulatory Visit: Payer: Self-pay

## 2021-03-05 VITALS — BP 120/74 | HR 60 | Temp 98.4°F | Resp 16 | Ht 61.0 in | Wt 179.0 lb

## 2021-03-05 DIAGNOSIS — D329 Benign neoplasm of meninges, unspecified: Secondary | ICD-10-CM | POA: Diagnosis not present

## 2021-03-05 DIAGNOSIS — F419 Anxiety disorder, unspecified: Secondary | ICD-10-CM | POA: Diagnosis not present

## 2021-03-05 DIAGNOSIS — F32A Depression, unspecified: Secondary | ICD-10-CM

## 2021-03-05 DIAGNOSIS — F40240 Claustrophobia: Secondary | ICD-10-CM

## 2021-03-05 DIAGNOSIS — E041 Nontoxic single thyroid nodule: Secondary | ICD-10-CM | POA: Diagnosis not present

## 2021-03-05 DIAGNOSIS — G4733 Obstructive sleep apnea (adult) (pediatric): Secondary | ICD-10-CM | POA: Diagnosis not present

## 2021-03-05 DIAGNOSIS — E7800 Pure hypercholesterolemia, unspecified: Secondary | ICD-10-CM | POA: Diagnosis not present

## 2021-03-05 DIAGNOSIS — I10 Essential (primary) hypertension: Secondary | ICD-10-CM | POA: Diagnosis not present

## 2021-03-05 DIAGNOSIS — K219 Gastro-esophageal reflux disease without esophagitis: Secondary | ICD-10-CM | POA: Diagnosis not present

## 2021-03-05 DIAGNOSIS — M5481 Occipital neuralgia: Secondary | ICD-10-CM | POA: Diagnosis not present

## 2021-03-05 MED ORDER — DIAZEPAM 5 MG PO TABS: 5.0000 mg | ORAL_TABLET | Freq: Every day | ORAL | 0 refills | Status: DC | PRN

## 2021-03-05 MED ORDER — ROSUVASTATIN CALCIUM 10 MG PO TABS: 10.0000 mg | ORAL_TABLET | Freq: Every day | ORAL | 1 refills | Status: DC

## 2021-03-05 MED ORDER — OLMESARTAN MEDOXOMIL 40 MG PO TABS: 40.0000 mg | ORAL_TABLET | Freq: Every day | ORAL | 1 refills | Status: DC

## 2021-03-05 MED ORDER — DILTIAZEM HCL ER COATED BEADS 360 MG PO CP24: ORAL_CAPSULE | ORAL | 1 refills | Status: DC

## 2021-03-05 NOTE — Progress Notes (Signed)
Name: Vickie Stewart   MRN: [id number]    DOB: 05-14-1946   Date:03/05/2021       Progress Note  Subjective  Chief Complaint  Follow Up  HPI  HTN: BP at home has been in the 130 range, today is 120/74  No chest pain or palpitation. She states she gets nervous when she checks her bp at home and has to do three reading to get an accurate reading.   Meningioma: still under the care of Dr. Lacinda Axon, last visit was Nov 2020 and is going back in Nov 22 when she is due for repeat MRI . He stated on his note that since asymptomatic and size is stable and small he does not recommend surgery at this time.    GERD: she is taking omeprazole 20 mg BID , she states symptom are controlled.    Thyroid nodule: seen by Dr. Gabriel Carina in 2015 no dysphagia, she went back 04/2020 and repeat US just showed a benign cyst left lower lobe. Right side of the neck used to puff up when she was coughing.  She had normal EGD done 01/2020, she states she was sent to vascular surgeon and studies were negative - I cannot see results    Hyperlipidemia: she takes Crestor, denies myalgias. Also taking fish oil. Last LDL was 77, continue current dose   Dysthymia : she states she is feeling better, phq 9 is 5, she states more energy and motivation lately. She is not taking any medications at this time   OSA: she has been wearing CPAP every night and is feeling better, wakes up and has to place mask back on. Good compliance    History of vaginal cancer, s/p brachytherapy done at [facility] , last pap smear was done 2021 and normal ,she denies vaginal odor.   Pre-diabetes: A1C was elevated at 5.8 % , denies polyphagia, polydipsia or polyuria, she will try to cut down on carb intake  Left occipital neuralgia/Dizziness: seen by Dr. Manuella Ghazi, ordered labs, MRI and NCS, also referred for vestibular rehab . Symptoms stable.   Patient Active Problem List   Diagnosis Date Noted   Pulsatile tinnitus of right ear 06/18/2020   Pulsatile neck mass  06/18/2020   OSA (obstructive sleep apnea) 09/19/2019   Anxiety and depression 09/19/2019   Left thyroid nodule 08/08/2019   Neck pain 01/03/2019   Meningioma ([place]) 33/54/5625   Lichen sclerosus 63/89/3734   Vaginal atrophy 03/18/2016   Encounter for follow-up surveillance of vaginal cancer 03/18/2016   Eczema 10/31/2015   Left hand pain 09/10/2015   Osteopenia after menopause 07/26/2015   Menopause 07/26/2015   Irritable colon 07/26/2015   History of cancer of vagina 07/26/2015   Diffuse cystic mastopathy 07/26/2015   Obesity (BMI 30.0-34.9) 07/26/2015   Intermittent low back pain 07/26/2015   Hyperlipemia 07/26/2015   Osteoarthritis 05/13/2015   Allergic rhinitis 05/13/2015   Chronic GERD 05/13/2015   Essential hypertension, benign     Past Surgical History:  Procedure Laterality Date   BREAST EXCISIONAL BIOPSY Left    neg   BREAST LUMPECTOMY Left    COLONOSCOPY  08/26/2006   COLONOSCOPY WITH PROPOFOL N/A 10/21/2016   Procedure: COLONOSCOPY WITH PROPOFOL;  Surgeon: Christene Lye, MD;  Location: [facility] ENDOSCOPY;  Service: Endoscopy;  Laterality: N/A;   ESOPHAGOGASTRODUODENOSCOPY (EGD) WITH PROPOFOL N/A 02/07/2020   Procedure: ESOPHAGOGASTRODUODENOSCOPY (EGD) WITH PROPOFOL;  Surgeon: Lin Landsman, MD;  Location: [facility] ENDOSCOPY;  Service: Gastroenterology;  Laterality: N/A;  Family History  Problem Relation Age of Onset   Pulmonary embolism Mother    Hypertension Father    Aortic stenosis Father    Breast cancer Neg Hx     Social History   Tobacco Use   Smoking status: Never   Smokeless tobacco: Never   Tobacco comments:    experimented with dip snuff as a child  Substance Use Topics   Alcohol use: No     Current Outpatient Medications:    aspirin 81 MG tablet, Take 81 mg by mouth daily. Reported on 10/10/2015, Disp: , Rfl:    celecoxib (CELEBREX) 200 MG capsule, TAKE 1 CAPSULE BY MOUTH TWICE A DAY, Disp: 90 capsule, Rfl: 0   cetirizine (ZYRTEC)  10 MG tablet, Take 1 tablet (10 mg total) by mouth daily., Disp: 30 tablet, Rfl: 0   cholecalciferol (VITAMIN D) 1000 units tablet, Take 1,000 Units by mouth daily., Disp: , Rfl:    COVID-19 mRNA Vac-TriS, Pfizer, (PFIZER-BIONT COVID-19 VAC-TRIS) SUSP injection, Inject into the muscle., Disp: 0.3 mL, Rfl: 0   desonide (DESOWEN) 0.05 % cream, Apply topically 2 (two) times daily., Disp: 60 g, Rfl: 0   diltiazem (CARDIZEM CD) 360 MG 24 hr capsule, TAKE 1 CAPSULE BY MOUTH EVERY DAY, Disp: 90 capsule, Rfl: 0   EPINEPHrine 0.3 mg/0.3 mL IJ SOAJ injection, Inject 0.3 mg into the muscle as needed for anaphylaxis., Disp: 2 each, Rfl: 0   fluticasone (FLONASE) 50 MCG/ACT nasal spray, Place 1 spray into both nostrils 2 (two) times daily., Disp: 16 g, Rfl: 0   hydrALAZINE (APRESOLINE) 10 MG tablet, Take 1 tablet (10 mg total) by mouth 3 (three) times daily. If bp remains above 160/90, Disp: 30 tablet, Rfl: 0   latanoprost (XALATAN) 0.005 % ophthalmic solution, PLACE 1 DROP IN BOTH EYES AT BEDTIME, Disp: , Rfl: 4   meclizine (ANTIVERT) 25 MG tablet, Take 1 tablet (25 mg total) by mouth 3 (three) times daily as needed for dizziness., Disp: 30 tablet, Rfl: 0   metaxalone (SKELAXIN) 800 MG tablet, Take 1 tablet (800 mg total) by mouth 3 (three) times daily., Disp: 90 tablet, Rfl: 0   olmesartan (BENICAR) 40 MG tablet, TAKE 1 TABLET (40 MG TOTAL) BY MOUTH DAILY. IN PLACE OF LISINOPRIL HCTZ, Disp: 90 tablet, Rfl: 0   Omega-3 Fatty Acids (FISH OIL CONCENTRATE PO), Take by mouth. Reported on 10/10/2015, Disp: , Rfl:    omeprazole (PRILOSEC) 20 MG capsule, TAKE 1 CAPSULE (20 MG TOTAL) BY MOUTH IN THE MORNING AND AT BEDTIME., Disp: 180 capsule, Rfl: 1   rosuvastatin (CRESTOR) 10 MG tablet, TAKE 1 TABLET BY MOUTH EVERY DAY, Disp: 90 tablet, Rfl: 1   triamcinolone (KENALOG) 0.1 %, Apply topically 2 (two) times daily., Disp: 453.6 g, Rfl: 0   vitamin C (ASCORBIC ACID) 500 MG tablet, Take 500 mg by mouth daily. Reported on  10/10/2015, Disp: , Rfl:    vitamin E 400 UNIT capsule, Take 400 Units by mouth daily. Reported on 10/10/2015, Disp: , Rfl:    Multiple Vitamin (MULTIVITAMIN) tablet, Take 1 tablet by mouth daily. Reported on 10/10/2015 (Patient not taking: Reported on 03/05/2021), Disp: , Rfl:   Allergies  Allergen Reactions   Atorvastatin     difficulty concentrating and focusing   Terbinafine And Related Dermatitis    Rash, itch, skin discoloration    I personally reviewed active problem list, medication list, allergies, family history, social history, health maintenance with the patient/caregiver today.   ROS  Constitutional:  Negative for fever or weight change.  Respiratory: Negative for cough and shortness of breath.   Cardiovascular: Negative for chest pain or palpitations.  Gastrointestinal: Negative for abdominal pain, no bowel changes.  Musculoskeletal: Negative for gait problem or joint swelling.  Skin: Negative for rash.  Neurological: Positive for dizziness or headache.  No other specific complaints in a complete review of systems (except as listed in HPI above).   Objective  Vitals:   03/05/21 1129  BP: 120/74  Pulse: 60  Resp: 16  Temp: 98.4 F (36.9 C)  SpO2: 97%  Weight: 179 lb (81.2 kg)  Height: 5\' 1"  (1.549 m)    Body mass index is 33.82 kg/m.  Physical Exam  Constitutional: Patient appears well-developed and well-nourished. Obese  No distress.  HEENT: head atraumatic, normocephalic, pupils equal and reactive to light, neck supple Cardiovascular: Normal rate, regular rhythm and normal heart sounds.  No murmur heard. No BLE edema. Pulmonary/Chest: Effort normal and breath sounds normal. No respiratory distress. Abdominal: Soft.  There is no tenderness. Psychiatric: Patient has a normal mood and affect. behavior is normal. Judgment and thought content normal.   PHQ2/9: Depression screen [facility] 2/9 03/05/2021 08/13/2020 04/29/2020 01/26/2020 10/20/2019  Decreased Interest 1  0 1 0 0  Down, Depressed, Hopeless 0 0 1 0 0  PHQ - 2 Score 1 0 2 0 0  Altered sleeping 0 - 1 1 0  Tired, decreased energy 0 - 1 1 0  Change in appetite 0 - 0 1 0  Feeling bad or failure about yourself  0 - 0 0 0  Trouble concentrating 0 - 1 1 0  Moving slowly or fidgety/restless 0 - 0 0 0  Suicidal thoughts 0 - 0 0 0  PHQ-9 Score 1 - 5 4 0  Difficult doing work/chores - - Not difficult at all Not difficult at all Not difficult at all  Some recent data might be hidden    phq 9 is negative   Fall Risk: Fall Risk  03/05/2021 08/13/2020 04/29/2020 01/26/2020 10/20/2019  Falls in the past year? 0 1 0 0 0  Number falls in past yr: 0 0 0 0 0  Injury with Fall? 0 0 0 0 0  Risk for fall due to : No Fall Risks - - - -  Follow up Falls prevention discussed - - - Falls evaluation completed      Functional Status Survey: Is the patient deaf or have difficulty hearing?: No Does the patient have difficulty seeing, even when wearing glasses/contacts?: No Does the patient have difficulty concentrating, remembering, or making decisions?: Yes Does the patient have difficulty walking or climbing stairs?: No Does the patient have difficulty dressing or bathing?: No Does the patient have difficulty doing errands alone such as visiting a doctor's office or shopping?: No    Assessment & Plan  1. Meningioma ([place])   2. Essential hypertension, benign  - diltiazem (CARDIZEM CD) 360 MG 24 hr capsule; TAKE 1 CAPSULE BY MOUTH EVERY DAY  Dispense: 90 capsule; Refill: 1 - olmesartan (BENICAR) 40 MG tablet; Take 1 tablet (40 mg total) by mouth daily.  Dispense: 90 tablet; Refill: 1  3. Pure hypercholesterolemia  - rosuvastatin (CRESTOR) 10 MG tablet; Take 1 tablet (10 mg total) by mouth daily.  Dispense: 90 tablet; Refill: 1  4. Claustrophobia  Sending rx for Valium to take the day of MRI, explained she cannot drive, she will ask her brother to take her in   2.  Gastroesophageal reflux disease  without esophagitis  Doing well, states sometimes skips the pm dose  6. OSA (obstructive sleep apnea)  Compliant   7. Anxiety and depression  Stable  8. Occipital neuralgia of left side  Under the care of Dr. Manuella Ghazi   9. Thyroid cyst

## 2021-03-06 IMAGING — CT CT ANGIO HEAD
2 of 3 series · 13 of 33 positions shown · IV contrast (APPLIED)
Comparison: 04/20/2019 and prior

CLINICAL DATA: Dizziness, persistent/recurrent, cardiac or vascular
cause suspected

EXAM:
CT ANGIOGRAPHY HEAD AND NECK
TECHNIQUE: Multidetector CT imaging of the head and neck was performed using
the standard protocol during bolus administration of intravenous
contrast. Multiplanar CT image reconstructions and MIPs were
obtained to evaluate the vascular anatomy. Carotid stenosis
measurements (when applicable) are obtained utilizing NASCET
criteria, using the distal internal carotid diameter as the
denominator.
CONTRAST:  75mL OMNIPAQUE IOHEXOL 350 MG/ML SOLN

[Series 2: head wo · axial · 0.43mm/px · z∈[-146,-11]mm · 12 of 33 slices shown]
[im 3/33  soft-tissue]
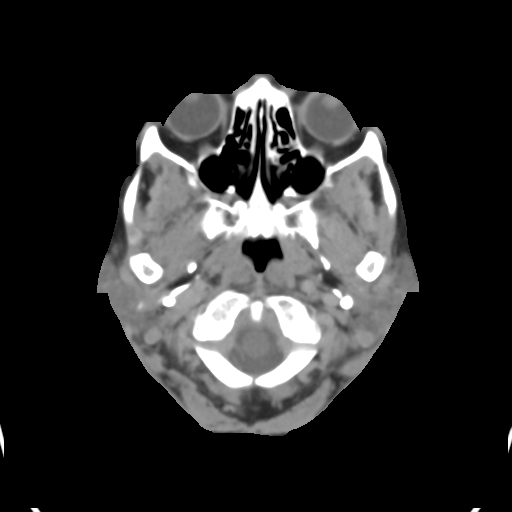
[im 5/33  bone]
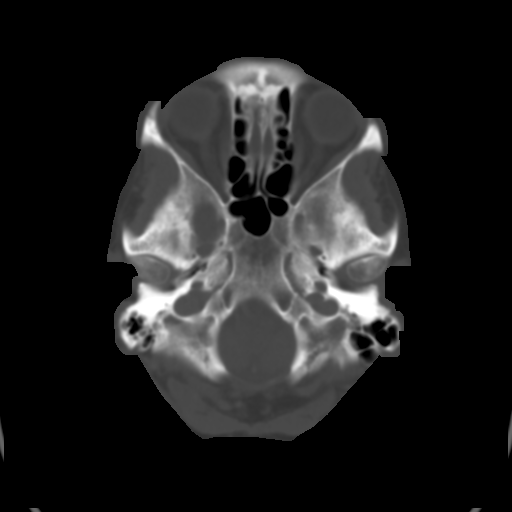
[im 8/33  soft-tissue]
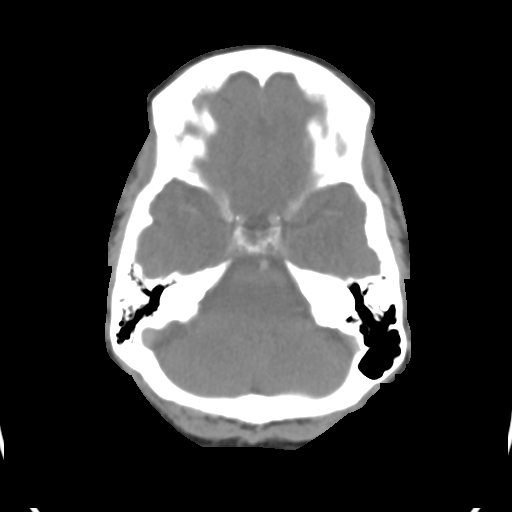
[im 10/33  bone]
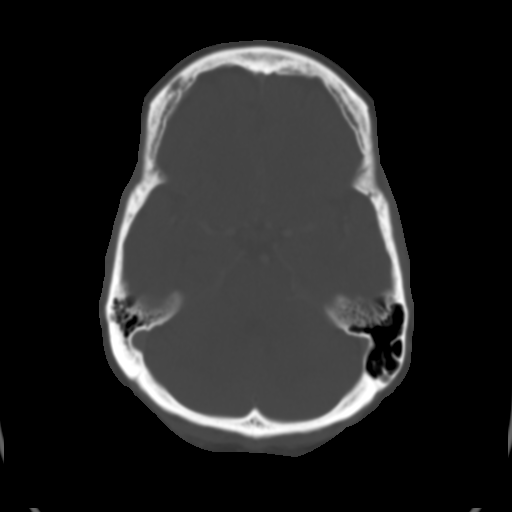
[im 13/33  soft-tissue]
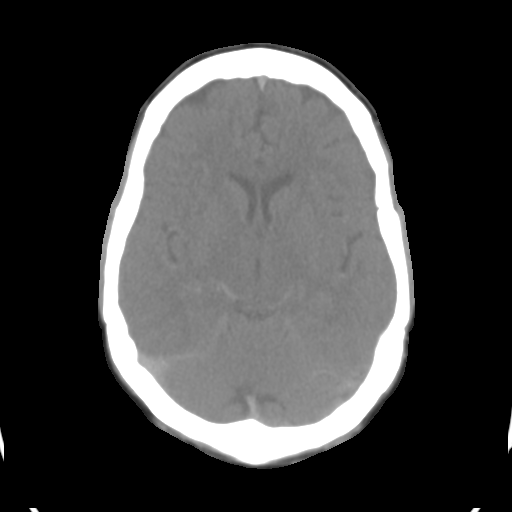
[im 15/33  bone]
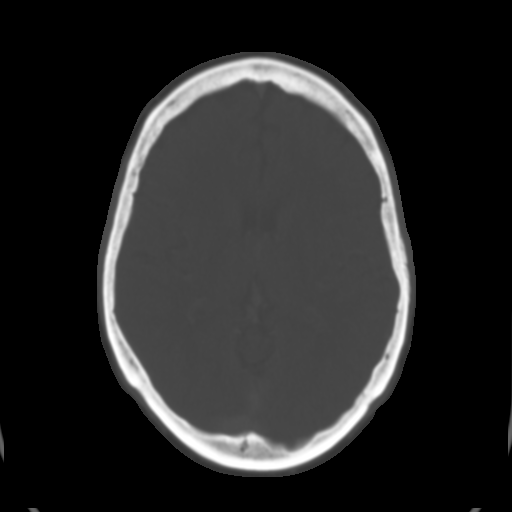
[im 18/33  soft-tissue]
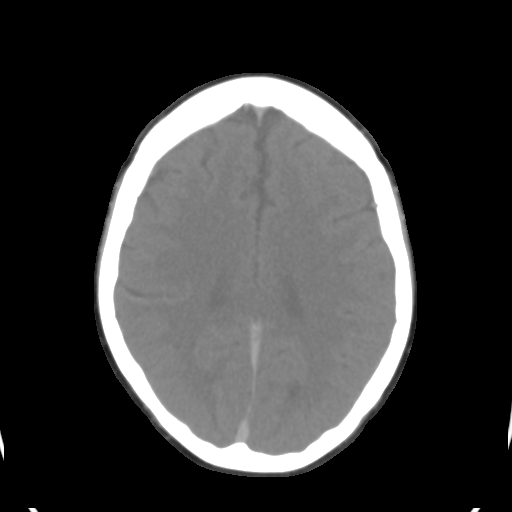
[im 20/33  bone]
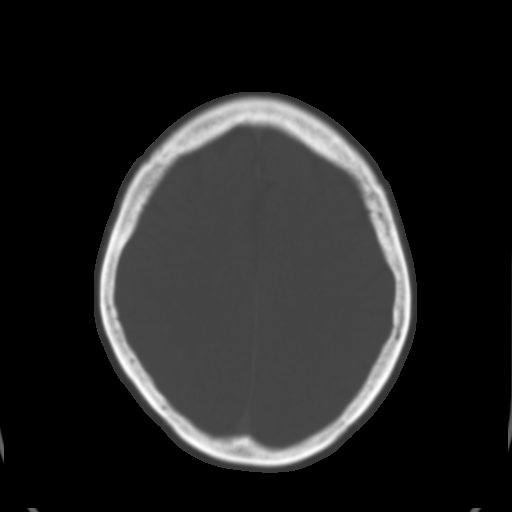
[im 23/33  soft-tissue]
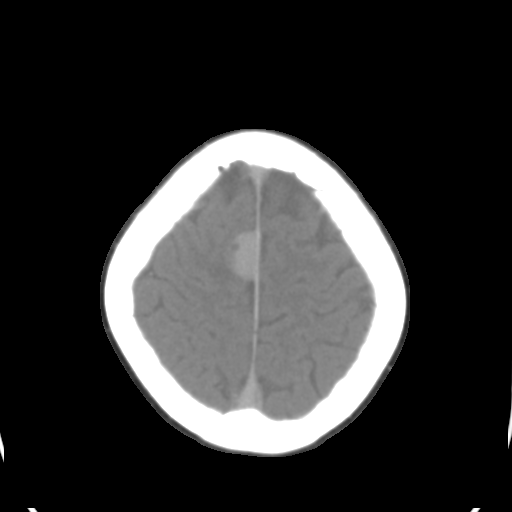
[im 25/33  bone]
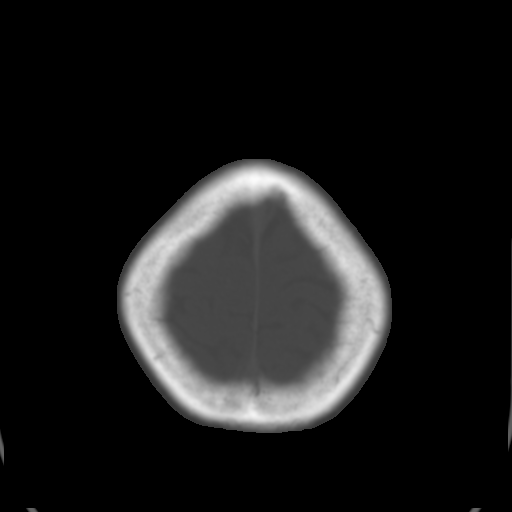
[im 28/33  soft-tissue]
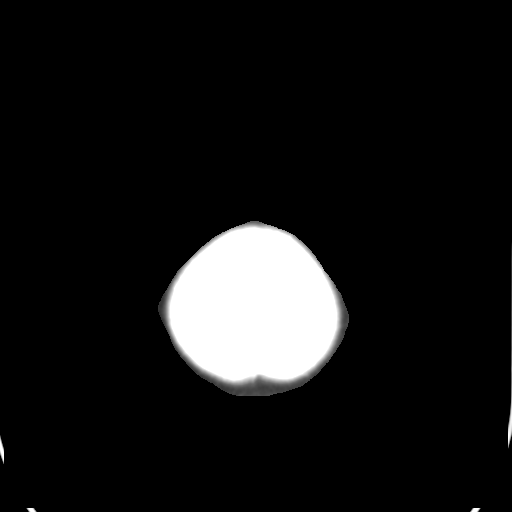
[im 30/33  bone]
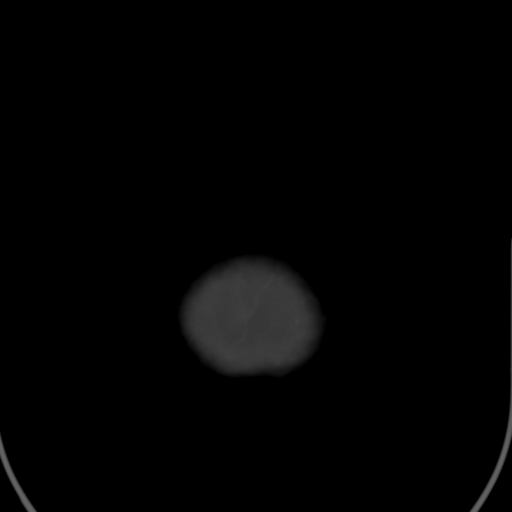

[Series 5: sagittal soft tissue · sagittal · 0.33mm/px · 1 of 55 slices shown]
[im 28/55  soft-tissue]
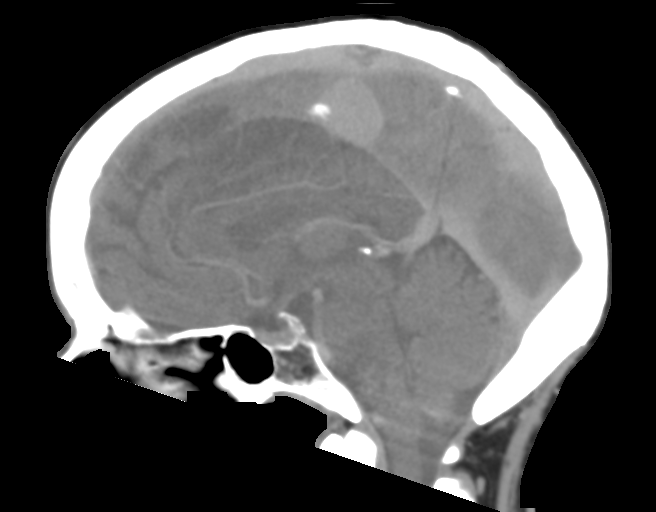

[13 of 33 positions shown; findings below may reference images not displayed]

FINDINGS: CT HEAD FINDINGS

Brain: No acute infarct or intracranial hemorrhage. 2.0 x 1.3 cm
right parafalcine meningioma is unchanged in size. No midline shift,
ventriculomegaly or extra-axial fluid collection.

Vascular: No hyperdense vessel or unexpected calcification.
Bilateral carotid siphon atherosclerotic calcifications.

Skull: No acute finding.

Sinuses/Orbits: Normal orbits. Pneumatized paranasal sinuses and
mastoid air cells.

Other: None.

Review of the MIP images confirms the above findings

CTA NECK FINDINGS

Aortic arch: Minimal aortic arch atherosclerotic calcifications.
Bovine arch anatomy. Patent great vessel origins.

Right carotid system: Patent. Minimal bifurcation atherosclerotic
calcifications.

Left carotid system: Patent.

Vertebral arteries: Mildly dominant left vertebral artery.  Patent.

Skeleton: No acute finding.  Multilevel spondylosis.

Other neck: Incidental subcentimeter left thyroid hypodensity. No
adenopathy.

Upper chest: Atelectasis.  No acute finding.

Review of the MIP images confirms the above findings

CTA HEAD FINDINGS

Anterior circulation: Patent. Minimal carotid siphon atherosclerotic
calcifications without significant narrowing. No significant
stenosis, proximal occlusion, aneurysm, or vascular malformation.

Posterior circulation: Patent V4 segments and basilar artery. Patent
superior cerebellar and posterior cerebral arteries. No significant
stenosis, proximal occlusion, aneurysm, or vascular malformation.

Venous sinuses: As permitted by contrast timing, patent.

Anatomic variants: None.

Review of the MIP images confirms the above findings
IMPRESSION: No large vessel occlusion, high-grade narrowing, dissection or
aneurysm.

No acute intracranial finding. 2.0 cm right para falcine meningioma
is unchanged.

## 2021-03-26 ENCOUNTER — Ambulatory Visit: Payer: Medicare PPO | Attending: Neurology

## 2021-03-26 ENCOUNTER — Other Ambulatory Visit: Payer: Self-pay

## 2021-03-26 DIAGNOSIS — R42 Dizziness and giddiness: Secondary | ICD-10-CM | POA: Insufficient documentation

## 2021-03-26 DIAGNOSIS — R2689 Other abnormalities of gait and mobility: Secondary | ICD-10-CM | POA: Insufficient documentation

## 2021-03-26 DIAGNOSIS — R2681 Unsteadiness on feet: Secondary | ICD-10-CM | POA: Insufficient documentation

## 2021-03-26 NOTE — Therapy (Deleted)
[facility] [place], [place], [address] Phone: [phone]   Fax:  [phone]  Physical Therapy Evaluation  Patient Details  Name: Vickie Stewart MRN: [id number] Date of Birth: [birth year] Referring Provider (PT): Vladimir Crofts, MD  Encounter Date: 03/26/2021   PT End of Session - 03/27/21 0957     Visit Number 1    Number of Visits 25    Date for PT Re-Evaluation 06/18/21    Authorization Type eval completed 03/26/2021    PT Start Time 1601    PT Stop Time 1702    PT Time Calculation (min) 61 min    Equipment Utilized During Treatment Gait belt    Activity Tolerance Patient tolerated treatment well    Behavior During Therapy WFL for tasks assessed/performed             Past Medical History:  Diagnosis Date   Allergic rhinitis, cause unspecified    Anxiety state, unspecified    Contact dermatitis and other eczema, due to unspecified cause    Diffuse cystic mastopathy    Dyspepsia and other specified disorders of function of stomach    Essential hypertension, benign    Glaucoma    Irritable bowel syndrome    Lateral epicondylitis  of elbow    Leukocytopenia, unspecified    Mastodynia    Obesity, unspecified    Pure hypercholesterolemia    Reflux esophagitis    Thoracic or lumbosacral neuritis or radiculitis, unspecified    Unspecified disorder of skin and subcutaneous tissue    Vaginal cancer ([place])    History    Past Surgical History:  Procedure Laterality Date   BREAST EXCISIONAL BIOPSY Left    neg   BREAST LUMPECTOMY Left    COLONOSCOPY  08/26/2006   COLONOSCOPY WITH PROPOFOL N/A 10/21/2016   Procedure: COLONOSCOPY WITH PROPOFOL;  Surgeon: Christene Lye, MD;  Location: [facility] ENDOSCOPY;  Service: Endoscopy;  Laterality: N/A;   ESOPHAGOGASTRODUODENOSCOPY (EGD) WITH PROPOFOL N/A 02/07/2020   Procedure: ESOPHAGOGASTRODUODENOSCOPY (EGD) WITH PROPOFOL;  Surgeon: Lin Landsman, MD;  Location: [facility] ENDOSCOPY;  Service: Gastroenterology;  Laterality: N/A;    There were no vitals filed for this visit.    Subjective Assessment - 03/26/21 1601     Subjective Pt reports her neck, HA and dizziness symptoms started over 1 year ago. The pt has attended PT before for neck problems. The pt reports PT did help her neck pain but that it never fully went away. Pt being followed by Dr. Manuella Ghazi, the pt reports being diagnosed with a pinched nerve in her neck. She will have another MRI November 22nd. Pt reports sitting for prolonged periods of time increases neck pain, increases HA and feeling of pressure. Pt rates pain as 6-7/10 currently. Pt denies any light sensitivity. She does report increased "wooziness" with bending forward. Pt also reports decreased balance, especially when in the shower, stating she can also feel "light-headed." Pt reports hx of vertigo 2-3x with most recent episode 8 months ago, and reports past vertigo is different from current symptoms. Reports difficulty now lying down on L side and that she feels a "light buzz." Pt has ringing in her ears sometimes and states, "I feel I need to clean them out a lot of the time." Pt reports hx of sinus problems. Pt reports no falls in last 6 months.    Pertinent History Pt is a 75 yo female presenting to PT with c/o  of neck pain, HA and dizziness sx that started >1 year ago. Pt previously seen in PT for neck pain, reports it improved but never fully resolved. Pt has been diagnosed with a "pinched nerve." Prolonged sitting increases her sx which also includes wooziness. Bending increases wooziness. Current pain is 6-7/10. Pt denies any light sensitivty. Pt does have hx of vertigo but reports these symptoms are different. She has difficulty lying on her L side (same side as HA) and can exerpience a "light buzz" sensation. Pt also experiences ringing in her ears sometimes. hx of chronic sinus problems. No falls in the last 6 months.  Movement improves sx. Other PMH significant allergic rhinitis, anxiety, HTN, glaucoma, IBS, pure hypercholesterolemia, thoracic or lumbosacral neuritis or radiculitis, vaginal CA, OA, GERD, osteopenia, meningioma, OSA    Limitations Sitting;Lifting;House hold activities   lifting/household activities limited with positions requiring bending   How long can you sit comfortably? Limited due to increased neck pain/HA. Reports unsure but maybe able to sit for a max of a couple hours if she has been active throughout the day.    How long can you stand comfortably? Pt repots only limited if standing in the shower    How long can you walk comfortably? Pt reports not limited    Diagnostic tests MR BRAIN 04/20/2019:    IMPRESSION:  Right para falcine meningioma is stable from 1 year ago. No mass in  the brain.     Mild chronic microvascular ischemic changes in the white matter also  stable.  CT ANGIO HEAD 2021: "IMPRESSION:  No large vessel occlusion, high-grade narrowing, dissection or  aneurysm.     No acute intracranial finding. 2.0 cm right para falcine meningioma  is unchanged." and CT ANGIO NECK 2021: "IMPRESSION:  No large vessel occlusion, high-grade narrowing, dissection or  aneurysm.     No acute intracranial finding. 2.0 cm right para falcine meningioma  is unchanged."    Patient Stated Goals Pt would like to be able to sit as long as she'd like    Currently in Pain? Yes    Pain Score 7     Pain Location Neck   headache   Pain Orientation Left    Pain Type Chronic pain    Pain Onset More than a month ago    Pain Frequency Intermittent    Aggravating Factors  Sitting for long periods of time, lying down on L side    Pain Relieving Factors movement, being active    Effect of Pain on Daily Activities affects pt's sleep, limits activities involving bending, limits ability to sit for prolonged periods of time            VESTIBULAR AND BALANCE EVALUATION   HISTORY:  Subjective history of  current problem: see subjective for full details. Pt presents with reports of neck pain, HA, wooziness. Description of dizziness: (vertigo, unsteadiness, lightheadedness, falling, general unsteadiness, whoozy, swimmy-headed sensation, aural fullness): lightheadedness, whoozy, unsteady (situation dependent) Frequency: occurs with prolonged sitting, bending, attempting to sleep on L side Symptom nature: (motion provoked, positional, spontaneous, constant, variable, intermittent): provoked by certain positions, prolonged sitting  Provocative Factors: sitting for long periods of time, bending Easing Factors: movement improves sx  Progression of symptoms: (better, worse, no change since onset) History of similar episodes: Symptoms began >1 year ago. Experienced short-term improvement with neck pain at previous PT. After discontinuing PT pain and symptoms have again increased.   Falls (yes/no): none Number of falls in past 6  months: none  Prior Functional Level: independent  Auditory complaints (tinnitus, pain, drainage, hearing loss, aural fullness): Vision (diplopia, visual field loss, recent changes, last eye exam): Pt got new lenses about two weeks ago. Feels she has to adjust her head more to see, wondering if she has correct prescription. Wears bifocals. Reports she feels as if she needs to clean out her ears often.   Red Flags: (dysarthria, dysphagia, drop attacks, bowel and bladder changes, recent weight loss/gain) Review of systems negative for red flags. Does report some weight gain over past few months. No other sx reported.     EXAMINATION  POSTURE: rounded shoulders, slight forward head posture  Pain: Pt reports L side neck pain with prolonged sitting as well as headaches. Pt rates pain as 6-7/10.   Orthostatics: deferred to next session    SOMATOSENSORY:         Sensation           Intact      Diminished         Absent  Light touch WNL      COORDINATION: Finger to  Nose: Normal Heel to Shin: Normal Rapid Alternating Movements: Normal   MUSCULOSKELETAL SCREEN: Cervical Spine ROM:   Cervical lat flexion: 35 deg L, 30 deg R impaired Cervical extension 40 deg impaired Cervical flexion 18 deg impaired  Cervical Rotation: R 75, L 79 just below normative values   MMT: deferred to future session   Gait: Formal assessment deferred to future session   POSTURAL CONTROL TESTS:   Clinical Test of Sensory Interaction for Balance    (CTSIB):  CONDITION TIME STRATEGY SWAY  Eyes open, firm surface 30 seconds ankle completes  Eyes closed, firm surface 30 seconds ankle completes  Eyes open, foam surface 30 seconds ankle completes  Eyes closed, foam surface 30 seconds ankle Unable    OCULOMOTOR / VESTIBULAR TESTING:  Oculomotor Exam- Room Light  Findings Comments  Ocular Alignment normal   Ocular ROM normal   Spontaneous Nystagmus not examined   Gaze-Holding Nystagmus normal   End-Gaze Nystagmus normal   Vergence (normal 2-3") normal   Smooth Pursuit abnormal saccadic  Cross-Cover Test not examined   Saccades normal   VOR Cancellation not examined   Left Head Impulse abnormal Corrective saccade   Right Head Impulse normal   Static Acuity normal   Dynamic Acuity abnormal Change from line 10 to line 7    BPPV TESTS:  Symptoms Duration Intensity Nystagmus  L Dix-Hallpike No sx   None  R Dix-Hallpike No sx   None  L Head Roll No sx   None  R Head Roll No sx   None    FUNCTIONAL OUTCOME MEASURES   Results Comments  DGI Deferred    10 Meter Gait Speed Deferred  Below normative values for full community ambulation  ABC Scale 95.63%   DHI 22/100 Low perception of handicap   FOTO 65 (goal 62)  HEP- PT provides education on performing exercises within pain-limited and symptom limited intensities and ranges. PT also instructs pt to watch for overall sx response. Instructs pt in technique, sets, reps, and rest breaks as well as frequency.  Pt verbalizes understanding. Access Code: [email or web address] URL: [email or web address] Date: 03/26/2021 Prepared by: Ricard Dillon  Exercises Seated Gaze Stabilization with Head Rotation - 1 x daily - 7 x weekly - 3 sets - 2 reps - 30 hold Seated Cervical Rotation Stretch - 1 x daily - 7 x weekly - 2  sets - 2 reps - 30 hold  Interventions: Seated VORx1, plain background with horizontal, vertical head turns 2x30 sec each. PT provides VC/demo/TC for technique. Pt requires multiple initial attempts to perform with correct technique.  Seated Cervical Rotation stretch - 2x30 sec B. Comments: PT provides instruction/education for the above interventions, including information about HEP (see above).  ASSESSMENT Clinical Impression: Pt is a 75 yo female referred to PT for dizziness, where pt also presents with c/o headaches and L side neck pain. Examination reveals impairments of cervical ROM and use of vestibular cues AEB performance on M-CTSIB. Pt also with abnormal smooth pursuits, positive L Head Impulse Test, and 3 line change on DVA. Pt DHI score indicates low perception of handicap. PT issued seated gaze stabilization intervention and cervical rotation stretch for HEP. Orthostatics to be assessed next session. Pt will benefit from further skilled PT services to address the aforementioned deficits in order to improve pain, mobility, balance, QOL and to decrease risk for future falls.    PLAN Next Visit: Orthostatics, DGI, gaze stabilization, balance interventions  HEP:  see above, access code: [email or web address]        Objective measurements completed on examination: See above findings.     Plan - 03/26/21 1629     Clinical Impression Statement Pt is a 75 yo female referred to PT for dizziness, where pt also presents with c/o headaches and L side neck pain. Examination reveals impairments of cervical ROM and use of vestibular cues AEB performance on M-CTSIB. Pt also with abnormal smooth  pursuits, positive L Head Impulse Test, and 3 line change on DVA. Pt DHI score indicates low perception of handicap. PT issued seated gaze stabilization intervention and cervical rotation stretch for HEP. Orthostatics to be assessed next session. Pt will benefit from further skilled PT services to address the aforementioned deficits in order to improve pain, mobility, balance, QOL and to decrease risk for future falls.    Personal Factors and Comorbidities Age;Past/Current Experience;Sex;Comorbidity 3+;Time since onset of injury/illness/exacerbation    Comorbidities PMH significant for allergic rhinitis, anxiety, HTN, glaucoma, IBS, pure hypercholesterolemia, thoracic or lumbosacral neuritis or radiculitis, vaginal CA, OA, GERD, osteopenia, meningioma, OSA    Examination-Activity Limitations Bathing;Bed Mobility;Bend;Stairs;Sit;Sleep;Locomotion Level;Hygiene/Grooming    Examination-Participation Restrictions Occupation;Yard Work;Cleaning;Community Activity;Shop    Stability/Clinical Decision Making Evolving/Moderate complexity    Clinical Decision Making Moderate    Rehab Potential Good    PT Frequency 2x / week    PT Duration 12 weeks    PT Treatment/Interventions ADLs/Self Care Home Management;Biofeedback;Canalith Repostioning;Cryotherapy;Electrical Stimulation;Moist Heat;Traction;Ultrasound;DME Instruction;Gait training;Stair training;Functional mobility training;Therapeutic activities;Therapeutic exercise;Balance training;Neuromuscular re-education;Patient/family education;Manual techniques;Dry needling;Passive range of motion;Energy conservation;Splinting;Taping;Vestibular;Visual/perceptual remediation/compensation;Joint Manipulations    PT Next Visit Plan orthostatics, DGI, 10MWT, gaze stabilization, balance interventions, manual therapy and therex to address cervical mobility    PT Home Exercise Plan Access Code: [email or web address]    Consulted and Agree with Plan of Care Patient              Patient will benefit from skilled therapeutic intervention in order to improve the following deficits and impairments:  Pain, Dizziness, Decreased range of motion, Hypomobility, Improper body mechanics, Decreased activity tolerance, Decreased balance, Decreased mobility, Increased muscle spasms, Impaired flexibility, Impaired vision/preception, Postural dysfunction  Visit Diagnosis: Unsteadiness on feet  Other abnormalities of gait and mobility  Dizziness and giddiness     Problem List Patient Active Problem List   Diagnosis Date Noted   Pulsatile tinnitus of right ear 06/18/2020   Pulsatile neck mass  06/18/2020   OSA (obstructive sleep apnea) 09/19/2019   Anxiety and depression 09/19/2019   Left thyroid nodule 08/08/2019   Neck pain 01/03/2019   Meningioma ([place]) 47/11/6149   Lichen sclerosus 83/43/7357   Vaginal atrophy 03/18/2016   Encounter for follow-up surveillance of vaginal cancer 03/18/2016   Eczema 10/31/2015   Left hand pain 09/10/2015   Osteopenia after menopause 07/26/2015   Menopause 07/26/2015   Irritable colon 07/26/2015   History of cancer of vagina 07/26/2015   Diffuse cystic mastopathy 07/26/2015   Obesity (BMI 30.0-34.9) 07/26/2015   Intermittent low back pain 07/26/2015   Hyperlipemia 07/26/2015   Osteoarthritis 05/13/2015   Allergic rhinitis 05/13/2015   Chronic GERD 05/13/2015   Essential hypertension, benign     Zollie Pee, PT 03/27/2021, 8:06 PM  [place] [facility] SERVICES [address] [place], [place], [address] Phone: [phone]   Fax:  [phone]  Name: Vickie Stewart MRN: [id number] Date of Birth: [birth year]

## 2021-03-27 NOTE — Therapy (Signed)
[place] MAIN [facility] SERVICES [place], [place], [address] Phone: [phone]   Fax:  [phone]  Physical Therapy Evaluation  Patient Details  Name: Vickie Stewart MRN: [id number] Date of Birth: 1946/04/11 Referring Provider (PT): Vladimir Crofts, MD   Encounter Date: 03/26/2021   PT End of Session - 03/27/21 0957     Visit Number 1    Number of Visits 25    Date for PT Re-Evaluation 06/18/21    Authorization Type eval completed 03/26/2021    PT Start Time 1601    PT Stop Time 1702    PT Time Calculation (min) 61 min    Equipment Utilized During Treatment Gait belt    Activity Tolerance Patient tolerated treatment well    Behavior During Therapy WFL for tasks assessed/performed             Past Medical History:  Diagnosis Date   Allergic rhinitis, cause unspecified    Anxiety state, unspecified    Contact dermatitis and other eczema, due to unspecified cause    Diffuse cystic mastopathy    Dyspepsia and other specified disorders of function of stomach    Essential hypertension, benign    Glaucoma    Irritable bowel syndrome    Lateral epicondylitis  of elbow    Leukocytopenia, unspecified    Mastodynia    Obesity, unspecified    Pure hypercholesterolemia    Reflux esophagitis    Thoracic or lumbosacral neuritis or radiculitis, unspecified    Unspecified disorder of skin and subcutaneous tissue    Vaginal cancer ([place])    History    Past Surgical History:  Procedure Laterality Date   BREAST EXCISIONAL BIOPSY Left    neg   BREAST LUMPECTOMY Left    COLONOSCOPY  08/26/2006   COLONOSCOPY WITH PROPOFOL N/A 10/21/2016   Procedure: COLONOSCOPY WITH PROPOFOL;  Surgeon: Christene Lye, MD;  Location: [facility] ENDOSCOPY;  Service: Endoscopy;  Laterality: N/A;   ESOPHAGOGASTRODUODENOSCOPY (EGD) WITH PROPOFOL N/A 02/07/2020   Procedure: ESOPHAGOGASTRODUODENOSCOPY (EGD) WITH PROPOFOL;  Surgeon: Lin Landsman, MD;  Location: [facility] ENDOSCOPY;  Service: Gastroenterology;  Laterality: N/A;    There were no vitals filed for this visit.    Subjective Assessment - 03/26/21 1601     Subjective Pt reports her neck, HA and dizziness symptoms started over 1 year ago. The pt has attended PT before for neck problems. The pt reports PT did help her neck pain but that it never fully went away. Pt being followed by Dr. Manuella Ghazi, the pt reports being diagnosed with a pinched nerve in her neck. She will have another MRI November 22nd. Pt reports sitting for prolonged periods of time increases neck pain, increases HA and feeling of pressure. Pt rates pain as 6-7/10 currently. Pt denies any light sensitivity. She does report increased "wooziness" with bending forward. Pt also reports decreased balance, especially when in the shower, stating she can also feel "light-headed." Pt reports hx of vertigo 2-3x with most recent episode 8 months ago, and reports past vertigo is different from current symptoms. Reports difficulty now lying down on L side and that she feels a "light buzz." Pt has ringing in her ears sometimes and states, "I feel I need to clean them out a lot of the time." Pt reports hx of sinus problems. Pt reports no falls in last 6 months.    Pertinent History Pt is a 75 yo female presenting to PT with  c/o of neck pain, HA and dizziness sx that started >1 year ago. Pt previously seen in PT for neck pain, reports it improved but never fully resolved. Pt has been diagnosed with a "pinched nerve." Prolonged sitting increases her sx which also includes wooziness. Bending increases wooziness. Current pain is 6-7/10. Pt denies any light sensitivty. Pt does have hx of vertigo but reports these symptoms are different. She has difficulty lying on her L side (same side as HA) and can exerpience a "light buzz" sensation. Pt also experiences ringing in her ears sometimes. hx of chronic sinus problems. No falls in the last 6  months. Movement improves sx. Other PMH significant allergic rhinitis, anxiety, HTN, glaucoma, IBS, pure hypercholesterolemia, thoracic or lumbosacral neuritis or radiculitis, vaginal CA, OA, GERD, osteopenia, meningioma, OSA    Limitations Sitting;Lifting;House hold activities   lifting/household activities limited with positions requiring bending   How long can you sit comfortably? Limited due to increased neck pain/HA. Reports unsure but maybe able to sit for a max of a couple hours if she has been active throughout the day.    How long can you stand comfortably? Pt repots only limited if standing in the shower    How long can you walk comfortably? Pt reports not limited    Diagnostic tests MR BRAIN 04/20/2019:    IMPRESSION:  Right para falcine meningioma is stable from 1 year ago. No mass in  the brain.     Mild chronic microvascular ischemic changes in the white matter also  stable.  CT ANGIO HEAD 2021: "IMPRESSION:  No large vessel occlusion, high-grade narrowing, dissection or  aneurysm.     No acute intracranial finding. 2.0 cm right para falcine meningioma  is unchanged." and CT ANGIO NECK 2021: "IMPRESSION:  No large vessel occlusion, high-grade narrowing, dissection or  aneurysm.     No acute intracranial finding. 2.0 cm right para falcine meningioma  is unchanged."    Patient Stated Goals Pt would like to be able to sit as long as she'd like    Currently in Pain? Yes    Pain Score 7     Pain Location Neck   headache   Pain Orientation Left    Pain Type Chronic pain    Pain Onset More than a month ago    Pain Frequency Intermittent    Aggravating Factors  Sitting for long periods of time, lying down on L side    Pain Relieving Factors movement, being active    Effect of Pain on Daily Activities affects pt's sleep, limits activities involving bending, limits ability to sit for prolonged periods of time                [facility] PT Assessment - 03/26/21 1620       Assessment    Medical Diagnosis Dizziness    Referring Provider (PT) Vladimir Crofts, MD    Onset Date/Surgical Date 02/29/20    Hand Dominance Right    Next MD Visit November 22    Prior Therapy yes      Precautions   Precautions Fall      Restrictions   Weight Bearing Restrictions No      Balance Screen   Has the patient fallen in the past 6 months No      [place] residence    Living Arrangements Alone    Available Help at Discharge Family    Type of [place]  Home Access Stairs to enter    Entrance Stairs-Number of Steps 2-5    Entrance Stairs-Rails Right   descending   Home Layout One level    Home Equipment None      Prior Function   Level of Independence Independent    Vocation Retired    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 Pt presents with reports of neck pain, HA, wooziness. Description of dizziness: (vertigo, unsteadiness, lightheadedness, falling, general unsteadiness, whoozy, swimmy-headed sensation, aural fullness): lightheadedness, whoozy, unsteady (situation dependent) Frequency: occurs with prolonged sitting, bending, attempting to sleep on L side Symptom nature: (motion provoked, positional, spontaneous, constant, variable, intermittent): provoked by certain positions, prolonged sitting  Provocative Factors: sitting for long periods of time, bending Easing Factors: movement improves sx  Progression of symptoms: (better, worse, no change since onset) History of similar episodes: Symptoms began >1 year ago. Experienced short-term improvement with neck pain at previous PT. After discontinuing PT pain and  symptoms have again increased.   Falls (yes/no): none Number of falls in past 6 months: none  Prior Functional Level: independent  Auditory complaints (tinnitus, pain, drainage, hearing loss, aural fullness): Vision (diplopia, visual field loss, recent changes, last eye exam): Pt got new lenses about two weeks ago. Feels she has to adjust her head more to see, wondering if she has correct prescription. Wears bifocals. Reports she feels as if she needs to clean out her ears often.   Red Flags: (dysarthria, dysphagia, drop attacks, bowel and bladder changes, recent weight loss/gain) Review of systems negative for red flags. Does report some weight gain over past few months. No other sx reported.     EXAMINATION  POSTURE: rounded shoulders, slight forward head posture  Pain: Pt reports L side neck pain with prolonged sitting as well as headaches. Pt rates pain as 6-7/10.   Orthostatics: deferred to next session    SOMATOSENSORY:         Sensation           Intact      Diminished         Absent  Light touch WNL      COORDINATION: Finger to Nose: Normal Heel to Shin: Normal Rapid Alternating Movements: Normal   MUSCULOSKELETAL SCREEN: Cervical Spine ROM:   Cervical lat flexion: 35 deg L, 30 deg R impaired Cervical extension 40 deg impaired Cervical flexion 18 deg impaired  Cervical Rotation: R 75, L 79 just below normative values   MMT: deferred to future session   Gait: Formal assessment deferred to future session   POSTURAL CONTROL TESTS:   Clinical Test of Sensory Interaction for Balance    (CTSIB):  CONDITION TIME STRATEGY SWAY  Eyes open, firm surface 30 seconds ankle completes  Eyes closed, firm surface 30 seconds ankle completes  Eyes open, foam surface 30 seconds ankle completes  Eyes closed, foam surface 30 seconds ankle Unable    OCULOMOTOR / VESTIBULAR TESTING:  Oculomotor Exam- Room Light  Findings Comments  Ocular Alignment normal   Ocular  ROM normal   Spontaneous Nystagmus not examined   Gaze-Holding Nystagmus normal  End-Gaze Nystagmus normal   Vergence (normal 2-3") normal   Smooth Pursuit abnormal saccadic  Cross-Cover Test not examined   Saccades normal   VOR Cancellation not examined   Left Head Impulse abnormal Corrective saccade   Right Head Impulse normal   Static Acuity normal   Dynamic Acuity abnormal Change from line 10 to line 7    BPPV TESTS:  Symptoms Duration Intensity Nystagmus  L Dix-Hallpike No sx   None  R Dix-Hallpike No sx   None  L Head Roll No sx   None  R Head Roll No sx   None    FUNCTIONAL OUTCOME MEASURES   Results Comments  DGI Deferred    10 Meter Gait Speed Deferred  Below normative values for full community ambulation  ABC Scale 95.63%   DHI 22/100 Low perception of handicap   FOTO 65 (goal 62)  HEP- PT provides education on performing exercises within pain-limited and symptom limited intensities and ranges. PT also instructs pt to watch for overall sx response. Instructs pt in technique, sets, reps, and rest breaks as well as frequency. Pt verbalizes understanding. Access Code: [email or web address] URL: [email or web address] Date: 03/26/2021 Prepared by: Ricard Dillon  Exercises Seated Gaze Stabilization with Head Rotation - 1 x daily - 7 x weekly - 3 sets - 2 reps - 30 hold Seated Cervical Rotation Stretch - 1 x daily - 7 x weekly - 2 sets - 2 reps - 30 hold  Interventions: Seated VORx1, plain background with horizontal, vertical head turns 2x30 sec each. PT provides VC/demo/TC for technique. Pt requires multiple initial attempts to perform with correct technique.  Seated Cervical Rotation stretch - 2x30 sec B. Comments: PT provides instruction/education for the above interventions, including information about HEP (see above).  ASSESSMENT Clinical Impression: Pt is a 75 yo female referred to PT for dizziness, where pt also presents with c/o headaches and L side neck  pain. Examination reveals impairments of cervical ROM and use of vestibular cues AEB performance on M-CTSIB. Pt also with abnormal smooth pursuits, positive L Head Impulse Test, and 3 line change on DVA. Pt DHI score indicates low perception of handicap. PT issued seated gaze stabilization intervention and cervical rotation stretch for HEP. Orthostatics to be assessed next session. Pt will benefit from further skilled PT services to address the aforementioned deficits in order to improve pain, mobility, balance, QOL and to decrease risk for future falls.    PLAN Next Visit: Orthostatics, DGI, gaze stabilization, balance interventions  HEP:  see above, access code: [email or web address]        Objective measurements completed on examination: See above findings.            PT Education - 03/27/21 0957     Education Details Assessment findings, indications for PT/POC, HEP    Person(s) Educated Patient    Methods Explanation;Demonstration;Tactile cues;Verbal cues;Handout    Comprehension Verbalized understanding;Returned demonstration;Tactile cues required;Need further instruction              PT Short Term Goals - 03/27/21 1254       PT SHORT TERM GOAL #1   Title Pt will be independent with HEP in order to improve strength and balance in order to decrease fall risk and improve function at home and in the community    Baseline 10/27: issued seated VOR and cervical rotation stretch    Time 6    Period Weeks    Status New    Target Date 05/07/21  PT Long Term Goals - 03/27/21 1255       PT LONG TERM GOAL #1   Title Pt will decrease DHI score by at least 18 points in order to demonstrate clinically significant reduction in disability    Baseline 10/26: 22/100    Time 12    Period Weeks    Status New    Target Date 06/18/21      PT LONG TERM GOAL #2   Title Patient will increase FOTO score by at least 10 points to demonstrate improvement in mobility and  quality of life.    Baseline 10/26: 65    Time 12    Period Weeks    Status New    Target Date 06/18/21      PT LONG TERM GOAL #3   Title Pt will improve DGI by at least 3 points in order to demonstrate clinically significant improvement in balance and decreased risk for falls.    Baseline 10/26: to be tested in next 1-2 sessions    Time 12    Period Weeks    Status New    Target Date 06/18/21      PT LONG TERM GOAL #4   Title Pt will report a worst neck pain of no greater than 3/10 within the past two weeks in order to improve QOL.    Baseline 10/27: pain currently 6-7/10.    Period Weeks    Status New    Target Date 06/18/21      PT LONG TERM GOAL #5   Title Pt will report ability to bend down/over to complete yard work/ADLs without wooziness or decreased postural stability.    Baseline 10/26: Pt reports increased wooziness with bending and FOF/unsteadiness    Time 12    Period Weeks    Status New    Target Date 06/18/21                    Plan - 03/26/21 1629     Clinical Impression Statement Pt is a 75 yo female referred to PT for dizziness, where pt also presents with c/o headaches and L side neck pain. Examination reveals impairments of cervical ROM and use of vestibular cues AEB performance on M-CTSIB. Pt also with abnormal smooth pursuits, positive L Head Impulse Test, and 3 line change on DVA. Pt DHI score indicates low perception of handicap. PT issued seated gaze stabilization intervention and cervical rotation stretch for HEP. Orthostatics to be assessed next session. Pt will benefit from further skilled PT services to address the aforementioned deficits in order to improve pain, mobility, balance, QOL and to decrease risk for future falls.    Personal Factors and Comorbidities Age;Past/Current Experience;Sex;Comorbidity 3+;Time since onset of injury/illness/exacerbation    Comorbidities PMH significant for allergic rhinitis, anxiety, HTN, glaucoma, IBS, pure  hypercholesterolemia, thoracic or lumbosacral neuritis or radiculitis, vaginal CA, OA, GERD, osteopenia, meningioma, OSA    Examination-Activity Limitations Bathing;Bed Mobility;Bend;Stairs;Sit;Sleep;Locomotion Level;Hygiene/Grooming    Examination-Participation Restrictions Occupation;Yard Work;Cleaning;Community Activity;Shop    Stability/Clinical Decision Making Evolving/Moderate complexity    Clinical Decision Making Moderate    Rehab Potential Good    PT Frequency 2x / week    PT Duration 12 weeks    PT Treatment/Interventions ADLs/Self Care Home Management;Biofeedback;Canalith Repostioning;Cryotherapy;Electrical Stimulation;Moist Heat;Traction;Ultrasound;DME Instruction;Gait training;Stair training;Functional mobility training;Therapeutic activities;Therapeutic exercise;Balance training;Neuromuscular re-education;Patient/family education;Manual techniques;Dry needling;Passive range of motion;Energy conservation;Splinting;Taping;Vestibular;Visual/perceptual remediation/compensation;Joint Manipulations    PT Next Visit Plan orthostatics, DGI, 10MWT, gaze stabilization, balance interventions, manual therapy and  therex to address cervical mobility    PT Home Exercise Plan Access Code: [email or web address]    Consulted and Agree with Plan of Care Patient             Patient will benefit from skilled therapeutic intervention in order to improve the following deficits and impairments:  Pain, Dizziness, Decreased range of motion, Hypomobility, Improper body mechanics, Decreased activity tolerance, Decreased balance, Decreased mobility, Increased muscle spasms, Impaired flexibility, Impaired vision/preception, Postural dysfunction  Visit Diagnosis: Unsteadiness on feet  Other abnormalities of gait and mobility  Dizziness and giddiness     Problem List Patient Active Problem List   Diagnosis Date Noted   Pulsatile tinnitus of right ear 06/18/2020   Pulsatile neck mass 06/18/2020   OSA  (obstructive sleep apnea) 09/19/2019   Anxiety and depression 09/19/2019   Left thyroid nodule 08/08/2019   Neck pain 01/03/2019   Meningioma ([place]) 77/93/9030   Lichen sclerosus 02/21/3006   Vaginal atrophy 03/18/2016   Encounter for follow-up surveillance of vaginal cancer 03/18/2016   Eczema 10/31/2015   Left hand pain 09/10/2015   Osteopenia after menopause 07/26/2015   Menopause 07/26/2015   Irritable colon 07/26/2015   History of cancer of vagina 07/26/2015   Diffuse cystic mastopathy 07/26/2015   Obesity (BMI 30.0-34.9) 07/26/2015   Intermittent low back pain 07/26/2015   Hyperlipemia 07/26/2015   Osteoarthritis 05/13/2015   Allergic rhinitis 05/13/2015   Chronic GERD 05/13/2015   Essential hypertension, benign     Zollie Pee, PT 03/27/2021, 8:08 PM  [place] MAIN [facility] SERVICES [address] [place], [place], [address] Phone: [phone]   Fax:  [phone]  Name: Vickie Stewart MRN: [id number] Date of Birth: September 22, 1945

## 2021-03-30 DIAGNOSIS — G4733 Obstructive sleep apnea (adult) (pediatric): Secondary | ICD-10-CM | POA: Diagnosis not present

## 2021-04-02 ENCOUNTER — Ambulatory Visit: Payer: Medicare PPO | Attending: Neurology

## 2021-04-02 ENCOUNTER — Other Ambulatory Visit: Payer: Self-pay

## 2021-04-02 DIAGNOSIS — R2689 Other abnormalities of gait and mobility: Secondary | ICD-10-CM | POA: Insufficient documentation

## 2021-04-02 DIAGNOSIS — R42 Dizziness and giddiness: Secondary | ICD-10-CM | POA: Insufficient documentation

## 2021-04-02 DIAGNOSIS — R2681 Unsteadiness on feet: Secondary | ICD-10-CM | POA: Insufficient documentation

## 2021-04-02 DIAGNOSIS — M542 Cervicalgia: Secondary | ICD-10-CM | POA: Insufficient documentation

## 2021-04-02 DIAGNOSIS — M6281 Muscle weakness (generalized): Secondary | ICD-10-CM | POA: Insufficient documentation

## 2021-04-02 NOTE — Therapy (Signed)
[place] MAIN [facility] SERVICES [address] [place], [place], [address] Phone: [phone]   Fax:  [phone]  Physical Therapy Treatment  Patient Details  Name: Vickie Stewart MRN: [id number] Date of Birth: Feb 10, 1946 Referring Provider (PT): Vladimir Crofts, MD   Encounter Date: 04/02/2021   PT End of Session - 04/02/21 1251     Visit Number 2    Number of Visits 25    Date for PT Re-Evaluation 06/18/21    Authorization Type eval completed 03/26/2021    PT Start Time 1134    PT Stop Time 1220    PT Time Calculation (min) 46 min    Equipment Utilized During Treatment Gait belt    Activity Tolerance Patient tolerated treatment well;No increased pain    Behavior During Therapy WFL for tasks assessed/performed             Past Medical History:  Diagnosis Date   Allergic rhinitis, cause unspecified    Anxiety state, unspecified    Contact dermatitis and other eczema, due to unspecified cause    Diffuse cystic mastopathy    Dyspepsia and other specified disorders of function of stomach    Essential hypertension, benign    Glaucoma    Irritable bowel syndrome    Lateral epicondylitis  of elbow    Leukocytopenia, unspecified    Mastodynia    Obesity, unspecified    Pure hypercholesterolemia    Reflux esophagitis    Thoracic or lumbosacral neuritis or radiculitis, unspecified    Unspecified disorder of skin and subcutaneous tissue    Vaginal cancer ([place])    History    Past Surgical History:  Procedure Laterality Date   BREAST EXCISIONAL BIOPSY Left    neg   BREAST LUMPECTOMY Left    COLONOSCOPY  08/26/2006   COLONOSCOPY WITH PROPOFOL N/A 10/21/2016   Procedure: COLONOSCOPY WITH PROPOFOL;  Surgeon: Christene Lye, MD;  Location: [facility] ENDOSCOPY;  Service: Endoscopy;  Laterality: N/A;   ESOPHAGOGASTRODUODENOSCOPY (EGD) WITH PROPOFOL N/A 02/07/2020   Procedure: ESOPHAGOGASTRODUODENOSCOPY (EGD) WITH PROPOFOL;   Surgeon: Lin Landsman, MD;  Location: [facility] ENDOSCOPY;  Service: Gastroenterology;  Laterality: N/A;    There were no vitals filed for this visit.   Subjective Assessment - 04/02/21 1136     Subjective Pt reports continued L neck pain but that it has improved somewhat since last seen.    Pertinent History Pt is a 75 yo female presenting to PT with c/o of neck pain, HA and dizziness sx that started >1 year ago. Pt previously seen in PT for neck pain, reports it improved but never fully resolved. Pt has been diagnosed with a "pinched nerve." Prolonged sitting increases her sx which also includes wooziness. Bending increases wooziness. Current pain is 6-7/10. Pt denies any light sensitivty. Pt does have hx of vertigo but reports these symptoms are different. She has difficulty lying on her L side (same side as HA) and can exerpience a "light buzz" sensation. Pt also experiences ringing in her ears sometimes. hx of chronic sinus problems. No falls in the last 6 months. Movement improves sx. Other PMH significant allergic rhinitis, anxiety, HTN, glaucoma, IBS, pure hypercholesterolemia, thoracic or lumbosacral neuritis or radiculitis, vaginal CA, OA, GERD, osteopenia, meningioma, OSA    Limitations Sitting;Lifting;House hold activities   lifting/household activities limited with positions requiring bending   How long can you sit comfortably? Limited due to increased neck pain/HA. Reports unsure but maybe able to sit  for a max of a couple hours if she has been active throughout the day.    How long can you stand comfortably? Pt repots only limited if standing in the shower    How long can you walk comfortably? Pt reports not limited    Diagnostic tests MR BRAIN 04/20/2019:    IMPRESSION:  Right para falcine meningioma is stable from 1 year ago. No mass in  the brain.     Mild chronic microvascular ischemic changes in the white matter also  stable.  CT ANGIO HEAD 2021: "IMPRESSION:  No large vessel  occlusion, high-grade narrowing, dissection or  aneurysm.     No acute intracranial finding. 2.0 cm right para falcine meningioma  is unchanged." and CT ANGIO NECK 2021: "IMPRESSION:  No large vessel occlusion, high-grade narrowing, dissection or  aneurysm.     No acute intracranial finding. 2.0 cm right para falcine meningioma  is unchanged."    Patient Stated Goals Pt would like to be able to sit as long as she'd like    Currently in Pain? Yes    Pain Location Neck    Pain Orientation Left    Pain Onset More than a month ago             INTERVENTIONS-  Supine- 130/75 mmHg, HR 55 bpm Seated- 143/63 mmHg, HR 55 bpm Standing - 130/66 mmHg HR 57 bpm; mild increase in sway  10MWT: 1.08 m/s  DGI: 22/24  Cervical rotation stretch 2x30 sec each direction Upper trap stretch 2x30 sec B; Pt reports some pain/wooziness when going to L   Manual: Pt supine on plinth, hot pack applied to each shoulder x5-10 min on each side. Once removed pt reports no adverse reaction and no adverse reaction observed (skin WNL). PT provides the following stretches with STM for several minutes of each: -Suboccipital Release -Upper trap stretch B -Cervical Rotation stretch B -Stretch into cervical flexion  Comments: PT also provides STM while holding pt in various stretches, additionally adds inferior overpressure to deepen cervical rotation and upper trap stretch within pt pain-free range.   Seated:  VORx1, plain background, horizontal head turns, pt 4 ft away from sticky onte - 2x30 sec --progressed to 2 ft away 2x30 sec; pt does report some mild sx with second set. Standing VORx1, horizontal head turns, plain background, CGA - 2x30 sec. Mild instability with standing, reports feeling unsteady.   PT instructs pt to work up to 5 minutes of seated VORx1, horizontal head turns in 30 sec bouts. Take rest breaks as needed. Pt verbalizes understanding.  Pt educated throughout session about proper posture and  technique with exercises. Improved exercise technique, movement at target joints, use of target muscles after min to mod verbal, visual, tactile cues.     PT Education - 04/02/21 1251     Education Details further assessment findings, exercise technique and body mechanics    Person(s) Educated Patient    Methods Explanation;Demonstration;Verbal cues    Comprehension Returned demonstration;Verbalized understanding;Verbal cues required;Need further instruction              PT Short Term Goals - 03/27/21 1254       PT SHORT TERM GOAL #1   Title Pt will be independent with HEP in order to improve strength and balance in order to decrease fall risk and improve function at home and in the community    Baseline 10/27: issued seated VOR and cervical rotation stretch    Time 6  Period Weeks    Status New    Target Date 05/07/21               PT Long Term Goals - 04/02/21 1720       PT LONG TERM GOAL #1   Title Pt will decrease DHI score by at least 18 points in order to demonstrate clinically significant reduction in disability    Baseline 10/26: 22/100    Time 12    Period Weeks    Status New    Target Date 06/18/21      PT LONG TERM GOAL #2   Title Patient will increase FOTO score by at least 10 points to demonstrate improvement in mobility and quality of life.    Baseline 10/26: 65    Time 12    Period Weeks    Status New    Target Date 06/18/21      PT LONG TERM GOAL #3   Title Pt will improve DGI by at least 2 points in order to demonstrate improvement in balance and decreased risk for falls.    Baseline 10/26: to be tested in next 1-2 sessions; 11/2: 22/24    Time 12    Period Weeks    Status Revised    Target Date 06/18/21      PT LONG TERM GOAL #4   Title Pt will report a worst neck pain of no greater than 3/10 within the past two weeks in order to improve QOL.    Baseline 10/27: pain currently 6-7/10.    Time 12    Period Weeks    Status New     Target Date 06/18/21      PT LONG TERM GOAL #5   Title Pt will report ability to bend down/over to complete yard work/ADLs without wooziness or decreased postural stability.    Baseline 10/26: Pt reports increased wooziness with bending and FOF/unsteadiness    Time 12    Period Weeks    Status New    Target Date 06/18/21                   Plan - 04/02/21 1508     Clinical Impression Statement Orthostatics assessed on this date. Pt did exhibit >10 point drop in diastolic BP with transition from supine (130/75 mmHg) to seated (143/63 mmHg), but reported no symptoms. However, throughout session upon sitting up following manual therapy, and standing up following seated interventions pt did report brief "lightheadedness." PT will continue to monitor pt's BP. Pt did report her doctor is aware of BP fluctuations. DGI and 10MWT also completed, where pt scored 22/24 and ambulated up to 1.08 m/s. Pt had most difficulty with head turns on DGI. Pt reported only mild symptoms with seated VORx1 with horizontal head turns, so PT instructed pt to increase total time performed at home up to 5 min. Pt verbalized understanding for all. The pt will benefit from further skilled PT to continue to improve pain, dizziness related symptoms, and balance in order to increase QOL and decrease fall risk.    Personal Factors and Comorbidities Age;Past/Current Experience;Sex;Comorbidity 3+;Time since onset of injury/illness/exacerbation    Comorbidities PMH significant for allergic rhinitis, anxiety, HTN, glaucoma, IBS, pure hypercholesterolemia, thoracic or lumbosacral neuritis or radiculitis, vaginal CA, OA, GERD, osteopenia, meningioma, OSA    Examination-Activity Limitations Bathing;Bed Mobility;Bend;Stairs;Sit;Sleep;Locomotion Level;Hygiene/Grooming    Examination-Participation Restrictions Occupation;Yard Work;Cleaning;Community Activity;Shop    Stability/Clinical Decision Making Evolving/Moderate complexity     Rehab Potential  Good    PT Frequency 2x / week    PT Duration 12 weeks    PT Treatment/Interventions ADLs/Self Care Home Management;Biofeedback;Canalith Repostioning;Cryotherapy;Electrical Stimulation;Moist Heat;Traction;Ultrasound;DME Instruction;Gait training;Stair training;Functional mobility training;Therapeutic activities;Therapeutic exercise;Balance training;Neuromuscular re-education;Patient/family education;Manual techniques;Dry needling;Passive range of motion;Energy conservation;Splinting;Taping;Vestibular;Visual/perceptual remediation/compensation;Joint Manipulations    PT Next Visit Plan orthostatics, DGI, 10MWT, gaze stabilization, balance interventions, manual therapy and therex to address cervical mobility    PT Home Exercise Plan Access Code: [email or web address], 11/2: increase performance of seated VOR to 5 min (performed in 30 sec bouts)    Consulted and Agree with Plan of Care Patient             Patient will benefit from skilled therapeutic intervention in order to improve the following deficits and impairments:  Pain, Dizziness, Decreased range of motion, Hypomobility, Improper body mechanics, Decreased activity tolerance, Decreased balance, Decreased mobility, Increased muscle spasms, Impaired flexibility, Impaired vision/preception, Postural dysfunction  Visit Diagnosis: Cervicalgia  Unsteadiness on feet  Other abnormalities of gait and mobility  Dizziness and giddiness     Problem List Patient Active Problem List   Diagnosis Date Noted   Pulsatile tinnitus of right ear 06/18/2020   Pulsatile neck mass 06/18/2020   OSA (obstructive sleep apnea) 09/19/2019   Anxiety and depression 09/19/2019   Left thyroid nodule 08/08/2019   Neck pain 01/03/2019   Meningioma ([place]) 70/14/1030   Lichen sclerosus 13/14/3888   Vaginal atrophy 03/18/2016   Encounter for follow-up surveillance of vaginal cancer 03/18/2016   Eczema 10/31/2015   Left hand pain 09/10/2015   Osteopenia  after menopause 07/26/2015   Menopause 07/26/2015   Irritable colon 07/26/2015   History of cancer of vagina 07/26/2015   Diffuse cystic mastopathy 07/26/2015   Obesity (BMI 30.0-34.9) 07/26/2015   Intermittent low back pain 07/26/2015   Hyperlipemia 07/26/2015   Osteoarthritis 05/13/2015   Allergic rhinitis 05/13/2015   Chronic GERD 05/13/2015   Essential hypertension, benign     Zollie Pee, PT 04/02/2021, 5:24 PM  [place] MAIN [facility] SERVICES [address] [place], [place], [address] Phone: [phone]   Fax:  [phone]  Name: Yailin Biederman MRN: [id number] Date of Birth: 03/10/1946

## 2021-04-08 ENCOUNTER — Ambulatory Visit: Payer: Medicare PPO

## 2021-04-08 ENCOUNTER — Other Ambulatory Visit: Payer: Self-pay

## 2021-04-08 DIAGNOSIS — M542 Cervicalgia: Secondary | ICD-10-CM | POA: Diagnosis not present

## 2021-04-08 DIAGNOSIS — R2681 Unsteadiness on feet: Secondary | ICD-10-CM

## 2021-04-08 DIAGNOSIS — R2689 Other abnormalities of gait and mobility: Secondary | ICD-10-CM

## 2021-04-08 DIAGNOSIS — R42 Dizziness and giddiness: Secondary | ICD-10-CM | POA: Diagnosis not present

## 2021-04-08 DIAGNOSIS — M6281 Muscle weakness (generalized): Secondary | ICD-10-CM | POA: Diagnosis not present

## 2021-04-08 NOTE — Therapy (Signed)
[place] MAIN [facility] SERVICES [address] [place], [place], [address] Phone: [phone]   Fax:  [phone]  Physical Therapy Treatment  Patient Details  Name: Timya Trimmer MRN: [id number] Date of Birth: February 13, 1946 Referring Provider (PT): Vladimir Crofts, MD   Encounter Date: 04/08/2021   PT End of Session - 04/08/21 1000     Visit Number 3    Number of Visits 25    Date for PT Re-Evaluation 06/18/21    Authorization Type eval completed 03/26/2021    PT Start Time 0850    PT Stop Time 0933    PT Time Calculation (min) 43 min    Equipment Utilized During Treatment Gait belt    Activity Tolerance Patient tolerated treatment well;No increased pain    Behavior During Therapy WFL for tasks assessed/performed             Past Medical History:  Diagnosis Date   Allergic rhinitis, cause unspecified    Anxiety state, unspecified    Contact dermatitis and other eczema, due to unspecified cause    Diffuse cystic mastopathy    Dyspepsia and other specified disorders of function of stomach    Essential hypertension, benign    Glaucoma    Irritable bowel syndrome    Lateral epicondylitis  of elbow    Leukocytopenia, unspecified    Mastodynia    Obesity, unspecified    Pure hypercholesterolemia    Reflux esophagitis    Thoracic or lumbosacral neuritis or radiculitis, unspecified    Unspecified disorder of skin and subcutaneous tissue    Vaginal cancer ([place])    History    Past Surgical History:  Procedure Laterality Date   BREAST EXCISIONAL BIOPSY Left    neg   BREAST LUMPECTOMY Left    COLONOSCOPY  08/26/2006   COLONOSCOPY WITH PROPOFOL N/A 10/21/2016   Procedure: COLONOSCOPY WITH PROPOFOL;  Surgeon: Christene Lye, MD;  Location: [facility] ENDOSCOPY;  Service: Endoscopy;  Laterality: N/A;   ESOPHAGOGASTRODUODENOSCOPY (EGD) WITH PROPOFOL N/A 02/07/2020   Procedure: ESOPHAGOGASTRODUODENOSCOPY (EGD) WITH PROPOFOL;   Surgeon: Lin Landsman, MD;  Location: [facility] ENDOSCOPY;  Service: Gastroenterology;  Laterality: N/A;    There were no vitals filed for this visit.   Subjective Assessment - 04/08/21 0852     Subjective Pt reports she did experience an increase in dizziness following last appointment. She reports she has not yet taken her BP today or had food or water yet, just coffee. She reports she is currently feeling a little lightheaded. Pt rates dizziness as 4/10 currently. Pt states pressure and dizziness is distracting but does not feel it interferes with her daily activities.    Pertinent History Pt is a 75 yo female presenting to PT with c/o of neck pain, HA and dizziness sx that started >1 year ago. Pt previously seen in PT for neck pain, reports it improved but never fully resolved. Pt has been diagnosed with a "pinched nerve." Prolonged sitting increases her sx which also includes wooziness. Bending increases wooziness. Current pain is 6-7/10. Pt denies any light sensitivty. Pt does have hx of vertigo but reports these symptoms are different. She has difficulty lying on her L side (same side as HA) and can exerpience a "light buzz" sensation. Pt also experiences ringing in her ears sometimes. hx of chronic sinus problems. No falls in the last 6 months. Movement improves sx. Other PMH significant allergic rhinitis, anxiety, HTN, glaucoma, IBS, pure hypercholesterolemia, thoracic or lumbosacral  neuritis or radiculitis, vaginal CA, OA, GERD, osteopenia, meningioma, OSA    Limitations Sitting;Lifting;House hold activities   lifting/household activities limited with positions requiring bending   How long can you sit comfortably? Limited due to increased neck pain/HA. Reports unsure but maybe able to sit for a max of a couple hours if she has been active throughout the day.    How long can you stand comfortably? Pt repots only limited if standing in the shower    How long can you walk comfortably? Pt reports  not limited    Diagnostic tests MR BRAIN 04/20/2019:    IMPRESSION:  Right para falcine meningioma is stable from 1 year ago. No mass in  the brain.     Mild chronic microvascular ischemic changes in the white matter also  stable.  CT ANGIO HEAD 2021: "IMPRESSION:  No large vessel occlusion, high-grade narrowing, dissection or  aneurysm.     No acute intracranial finding. 2.0 cm right para falcine meningioma  is unchanged." and CT ANGIO NECK 2021: "IMPRESSION:  No large vessel occlusion, high-grade narrowing, dissection or  aneurysm.     No acute intracranial finding. 2.0 cm right para falcine meningioma  is unchanged."    Patient Stated Goals Pt would like to be able to sit as long as she'd like    Currently in Pain? No/denies    Pain Onset More than a month ago            INTERVENTIONS-  Further BP assessment: Seated- 137/65 mmHg, HR 48 bpm Standing - 133/70 mmHg HR 49 bpm; mild increase in sway, but no significant change in symptoms from baseline.     Seated: Cervical rotation stretch 2x30 sec each direction Upper trap stretch 2x30 sec B Stretch into cervical flexion with chin tuck 60 sec Stretch into cervical extension 60 sec  Pt seated, PT provides STM to L upper trap and L cervical paraspinals x 12 min. Pt responds well to treatment reporting improvement in neck symptoms.   Standing, NBOS, VORx1 with horizontal head turns, busy background - 4x30 sec. Standing, semi-tandem, vertical head turns with busy background, - 2x30-40 sec with each LE as primary stance LE.  Comments: CGA provided throughout. Pt does exhibit increased sway/unsteadiness and reports dizziness reached 3-4/10 with interventions.  Pt takes rest intervals and monitored throughout for symptom response, with CGA due to unsteadiness.  Pt standing, NBOS, EC - 60 sec. Pt reports wooziness with intervention. Requires CGA-min a due to unsteadiness.   Pt seated. PT provides another 4 minutes of STM to L cervical  paraspinals and upper trap. Pt reports STM feels good/continues to improve symptoms.   Pt instructed through scapular squeeze intervention and performs 2x10 with 3 sec holds. PT also adds this intervention to pt's HEP and provides handout.    Access Code: [email or web address] URL: [email or web address] Date: 04/08/2021 Prepared by: Ricard Dillon  Exercises Seated Scapular Retraction - 1 x daily - 7 x weekly - 2 sets - 10 reps - 3 hold  Pt reports improvement with neck symptoms at end of session    Pt educated throughout session about proper posture and technique with exercises. Improved exercise technique, movement at target joints, use of target muscles after min to mod verbal, visual, tactile cues.       PT Education - 04/08/21 1000     Education Details exercise technique, addition to HEP    Person(s) Educated Patient    Methods Explanation;Demonstration;Verbal cues;Tactile cues;Handout    Comprehension  Verbalized understanding;Returned demonstration;Verbal cues required;Need further instruction              PT Short Term Goals - 03/27/21 1254       PT SHORT TERM GOAL #1   Title Pt will be independent with HEP in order to improve strength and balance in order to decrease fall risk and improve function at home and in the community    Baseline 10/27: issued seated VOR and cervical rotation stretch    Time 6    Period Weeks    Status New    Target Date 05/07/21               PT Long Term Goals - 04/02/21 1720       PT LONG TERM GOAL #1   Title Pt will decrease DHI score by at least 18 points in order to demonstrate clinically significant reduction in disability    Baseline 10/26: 22/100    Time 12    Period Weeks    Status New    Target Date 06/18/21      PT LONG TERM GOAL #2   Title Patient will increase FOTO score by at least 10 points to demonstrate improvement in mobility and quality of life.    Baseline 10/26: 65    Time 12    Period Weeks     Status New    Target Date 06/18/21      PT LONG TERM GOAL #3   Title Pt will improve DGI by at least 2 points in order to demonstrate improvement in balance and decreased risk for falls.    Baseline 10/26: to be tested in next 1-2 sessions; 11/2: 22/24    Time 12    Period Weeks    Status Revised    Target Date 06/18/21      PT LONG TERM GOAL #4   Title Pt will report a worst neck pain of no greater than 3/10 within the past two weeks in order to improve QOL.    Baseline 10/27: pain currently 6-7/10.    Time 12    Period Weeks    Status New    Target Date 06/18/21      PT LONG TERM GOAL #5   Title Pt will report ability to bend down/over to complete yard work/ADLs without wooziness or decreased postural stability.    Baseline 10/26: Pt reports increased wooziness with bending and FOF/unsteadiness    Time 12    Period Weeks    Status New    Target Date 06/18/21                   Plan - 04/08/21 1001     Clinical Impression Statement Pt with excellent motivation to participate in session and responds well to interventions, reporting improvement in neck symptoms and dizziness by end of session. Pt continues to be TTP throughout L upper trap and cervical paraspinals, particularly near occiput. Pt did also progress to performing standing VORx1 with horziontal, vertical head turns with busy background, with dizziness response no greater than 4/10, which decreased quickly to baseline levels with rest. The pt will benefit from further skilled PT to improve dizziness, and neck related symptoms, and balance to increase QOL and decrease fall risk.    Personal Factors and Comorbidities Age;Past/Current Experience;Sex;Comorbidity 3+;Time since onset of injury/illness/exacerbation    Comorbidities PMH significant for allergic rhinitis, anxiety, HTN, glaucoma, IBS, pure hypercholesterolemia, thoracic or lumbosacral neuritis or radiculitis, vaginal CA,  OA, GERD, osteopenia, meningioma, OSA     Examination-Activity Limitations Bathing;Bed Mobility;Bend;Stairs;Sit;Sleep;Locomotion Level;Hygiene/Grooming    Examination-Participation Restrictions Occupation;Yard Work;Cleaning;Community Activity;Shop    Stability/Clinical Decision Making Evolving/Moderate complexity    Rehab Potential Good    PT Frequency 2x / week    PT Duration 12 weeks    PT Treatment/Interventions ADLs/Self Care Home Management;Biofeedback;Canalith Repostioning;Cryotherapy;Electrical Stimulation;Moist Heat;Traction;Ultrasound;DME Instruction;Gait training;Stair training;Functional mobility training;Therapeutic activities;Therapeutic exercise;Balance training;Neuromuscular re-education;Patient/family education;Manual techniques;Dry needling;Passive range of motion;Energy conservation;Splinting;Taping;Vestibular;Visual/perceptual remediation/compensation;Joint Manipulations    PT Next Visit Plan orthostatics, DGI, 10MWT, gaze stabilization, balance interventions, manual therapy and therex to address cervical mobility    PT Home Exercise Plan Access Code: [email or web address], 11/2: increase performance of seated VOR to 5 min (performed in 30 sec bouts); 11/8: Access Code: [email or web address]    Consulted and Agree with Plan of Care Patient             Patient will benefit from skilled therapeutic intervention in order to improve the following deficits and impairments:  Pain, Dizziness, Decreased range of motion, Hypomobility, Improper body mechanics, Decreased activity tolerance, Decreased balance, Decreased mobility, Increased muscle spasms, Impaired flexibility, Impaired vision/preception, Postural dysfunction  Visit Diagnosis: Dizziness and giddiness  Unsteadiness on feet  Other abnormalities of gait and mobility     Problem List Patient Active Problem List   Diagnosis Date Noted   Pulsatile tinnitus of right ear 06/18/2020   Pulsatile neck mass 06/18/2020   OSA (obstructive sleep apnea) 09/19/2019   Anxiety and depression  09/19/2019   Left thyroid nodule 08/08/2019   Neck pain 01/03/2019   Meningioma ([place]) 60/73/7106   Lichen sclerosus 26/94/8546   Vaginal atrophy 03/18/2016   Encounter for follow-up surveillance of vaginal cancer 03/18/2016   Eczema 10/31/2015   Left hand pain 09/10/2015   Osteopenia after menopause 07/26/2015   Menopause 07/26/2015   Irritable colon 07/26/2015   History of cancer of vagina 07/26/2015   Diffuse cystic mastopathy 07/26/2015   Obesity (BMI 30.0-34.9) 07/26/2015   Intermittent low back pain 07/26/2015   Hyperlipemia 07/26/2015   Osteoarthritis 05/13/2015   Allergic rhinitis 05/13/2015   Chronic GERD 05/13/2015   Essential hypertension, benign     Zollie Pee, PT 04/08/2021, 10:15 AM  [place] MAIN [facility] SERVICES [address] [place], [place], [address] Phone: [phone]   Fax:  [phone]  Name: Undine Nealis MRN: [id number] Date of Birth: 07-06-1945

## 2021-04-15 DIAGNOSIS — R202 Paresthesia of skin: Secondary | ICD-10-CM | POA: Diagnosis not present

## 2021-04-16 ENCOUNTER — Other Ambulatory Visit: Payer: Self-pay

## 2021-04-16 ENCOUNTER — Ambulatory Visit: Payer: Medicare PPO

## 2021-04-16 DIAGNOSIS — R2681 Unsteadiness on feet: Secondary | ICD-10-CM

## 2021-04-16 DIAGNOSIS — R42 Dizziness and giddiness: Secondary | ICD-10-CM

## 2021-04-16 DIAGNOSIS — M6281 Muscle weakness (generalized): Secondary | ICD-10-CM | POA: Diagnosis not present

## 2021-04-16 DIAGNOSIS — M542 Cervicalgia: Secondary | ICD-10-CM | POA: Diagnosis not present

## 2021-04-16 DIAGNOSIS — R2689 Other abnormalities of gait and mobility: Secondary | ICD-10-CM | POA: Diagnosis not present

## 2021-04-16 NOTE — Therapy (Signed)
[place] [facility] SERVICES [address] [place], [place], [address] Phone: [phone]   Fax:  [phone]  Physical Therapy Treatment  Patient Details  Name: Vickie Stewart MRN: [id number] Date of Birth: 02/11/1946 Referring Provider (PT): Vladimir Crofts, MD   Encounter Date: 04/16/2021   PT End of Session - 04/16/21 1702     Visit Number 4    Number of Visits 25    Date for PT Re-Evaluation 06/18/21    Authorization Type eval completed 03/26/2021    PT Start Time 0916    PT Stop Time 1000    PT Time Calculation (min) 44 min    Equipment Utilized During Treatment Gait belt    Activity Tolerance Patient tolerated treatment well;No increased pain    Behavior During Therapy WFL for tasks assessed/performed             Past Medical History:  Diagnosis Date   Allergic rhinitis, cause unspecified    Anxiety state, unspecified    Contact dermatitis and other eczema, due to unspecified cause    Diffuse cystic mastopathy    Dyspepsia and other specified disorders of function of stomach    Essential hypertension, benign    Glaucoma    Irritable bowel syndrome    Lateral epicondylitis  of elbow    Leukocytopenia, unspecified    Mastodynia    Obesity, unspecified    Pure hypercholesterolemia    Reflux esophagitis    Thoracic or lumbosacral neuritis or radiculitis, unspecified    Unspecified disorder of skin and subcutaneous tissue    Vaginal cancer ([place])    History    Past Surgical History:  Procedure Laterality Date   BREAST EXCISIONAL BIOPSY Left    neg   BREAST LUMPECTOMY Left    COLONOSCOPY  08/26/2006   COLONOSCOPY WITH PROPOFOL N/A 10/21/2016   Procedure: COLONOSCOPY WITH PROPOFOL;  Surgeon: Christene Lye, MD;  Location: [facility] ENDOSCOPY;  Service: Endoscopy;  Laterality: N/A;   ESOPHAGOGASTRODUODENOSCOPY (EGD) WITH PROPOFOL N/A 02/07/2020   Procedure: ESOPHAGOGASTRODUODENOSCOPY (EGD) WITH PROPOFOL;   Surgeon: Lin Landsman, MD;  Location: [facility] ENDOSCOPY;  Service: Gastroenterology;  Laterality: N/A;    There were no vitals filed for this visit.   Subjective Assessment - 04/16/21 0912     Subjective Pt reports she is still dizzy, reports feeling foggy as well. Rates dizziness 3-4/10. Pt reports the other day dizziness was slightly worse to 4-4.5/10.  Pt reports no falls or stumbles. She reports she has been just a little off balance. Pt reports improvement with her HA, she reports she is trying to stay more active.    Pertinent History Pt is a 75 yo female presenting to PT with c/o of neck pain, HA and dizziness sx that started >1 year ago. Pt previously seen in PT for neck pain, reports it improved but never fully resolved. Pt has been diagnosed with a "pinched nerve." Prolonged sitting increases her sx which also includes wooziness. Bending increases wooziness. Current pain is 6-7/10. Pt denies any light sensitivty. Pt does have hx of vertigo but reports these symptoms are different. She has difficulty lying on her L side (same side as HA) and can exerpience a "light buzz" sensation. Pt also experiences ringing in her ears sometimes. hx of chronic sinus problems. No falls in the last 6 months. Movement improves sx. Other PMH significant allergic rhinitis, anxiety, HTN, glaucoma, IBS, pure hypercholesterolemia, thoracic or lumbosacral neuritis or radiculitis, vaginal CA,  OA, GERD, osteopenia, meningioma, OSA    Limitations Sitting;Lifting;House hold activities   lifting/household activities limited with positions requiring bending   How long can you sit comfortably? Limited due to increased neck pain/HA. Reports unsure but maybe able to sit for a max of a couple hours if she has been active throughout the day.    How long can you stand comfortably? Pt repots only limited if standing in the shower    How long can you walk comfortably? Pt reports not limited    Diagnostic tests MR BRAIN  04/20/2019:    IMPRESSION:  Right para falcine meningioma is stable from 1 year ago. No mass in  the brain.     Mild chronic microvascular ischemic changes in the white matter also  stable.  CT ANGIO HEAD 2021: "IMPRESSION:  No large vessel occlusion, high-grade narrowing, dissection or  aneurysm.     No acute intracranial finding. 2.0 cm right para falcine meningioma  is unchanged." and CT ANGIO NECK 2021: "IMPRESSION:  No large vessel occlusion, high-grade narrowing, dissection or  aneurysm.     No acute intracranial finding. 2.0 cm right para falcine meningioma  is unchanged."    Patient Stated Goals Pt would like to be able to sit as long as she'd like    Currently in Pain? No/denies    Pain Onset More than a month ago           INTERVENTIONS  Pt supine on plinth: PT provides STM and TrP release to B cervical paraspinals and posterior shoulder musculature, B upper trap. Pt also provides gentle cervical traction and STM to musculature of occipital triangle.  During STM PT brings pt through the following stretches: Cervical rotation B Upper trap B Stretch into cervical flexion Comments: the above was performed for several minutes  While pt still supine on plinth, pt performs chin tucks with manual resistance into PT's hands 10x with 3 sec holds Comments: Pt reports some wooziness symptoms following interventions  Seated scapular squeezes 10x with 3 sec holds; pt some difficulty with technique  UE MMT: shoulder musculature mostly WFL with exception of B shoulder ER/IR which was 4-/5 B for each, pt reports ache with testing  YTB ER pull aparts - 3x10 pt reports fatigue, pt reports some aching posterior neck  YTB IR - 3x10; reports achy feeling and fatigue YTB rows 2x10  CGA provided throughout unless otherwise noted:  Pt standing, WBOS, VORx1 with horizontal head turns, busy background  (5 ft from target)- 2x30 sec; pt reports dizziness as 3/10  Pt standing, NBOS, VORx1 with  horizontal head turns, busy background (5 ft from target) -  2x30 sec; dizziness remains 3/10 Pt standing, NBOS, VORx1 with busy background, vertical head turns (5 ft from target) - 2x30 sec; reports dizziness remains 3/10, describes as light-headedness  Dynamic balance, gait with horizontal head turns over 10 meter track 4x; pt reports unsteadiness, worse with looking to L  Dynamic balance, gait with vertical head turns over 10 meter track 4x;  continued reports of unsteadiness  Standing on airex pad EC 3x60 sec; intermittent UE support  SLB 2x30 sec each LE; intermittent UE support   Access Code: [email or web address] URL: [email or web address] Date: 04/16/2021 Prepared by: Ricard Dillon  Exercises Scapular Retraction with Resistance - 1 x daily - 5 x weekly - 2 sets - 15 reps Shoulder External Rotation and Scapular Retraction with Resistance - 1 x daily - 5 x weekly - 2 sets - 15 reps  Pt educated throughout session about proper posture and technique with exercises. Improved exercise technique, movement at target joints, use of target muscles after min to mod verbal, visual, tactile cues     PT Short Term Goals - 03/27/21 1254       PT SHORT TERM GOAL #1   Title Pt will be independent with HEP in order to improve strength and balance in order to decrease fall risk and improve function at home and in the community    Baseline 10/27: issued seated VOR and cervical rotation stretch    Time 6    Period Weeks    Status New    Target Date 05/07/21               PT Long Term Goals - 04/02/21 1720       PT LONG TERM GOAL #1   Title Pt will decrease DHI score by at least 18 points in order to demonstrate clinically significant reduction in disability    Baseline 10/26: 22/100    Time 12    Period Weeks    Status New    Target Date 06/18/21      PT LONG TERM GOAL #2   Title Patient will increase FOTO score by at least 10 points to demonstrate improvement in mobility  and quality of life.    Baseline 10/26: 65    Time 12    Period Weeks    Status New    Target Date 06/18/21      PT LONG TERM GOAL #3   Title Pt will improve DGI by at least 2 points in order to demonstrate improvement in balance and decreased risk for falls.    Baseline 10/26: to be tested in next 1-2 sessions; 11/2: 22/24    Time 12    Period Weeks    Status Revised    Target Date 06/18/21      PT LONG TERM GOAL #4   Title Pt will report a worst neck pain of no greater than 3/10 within the past two weeks in order to improve QOL.    Baseline 10/27: pain currently 6-7/10.    Time 12    Period Weeks    Status New    Target Date 06/18/21      PT LONG TERM GOAL #5   Title Pt will report ability to bend down/over to complete yard work/ADLs without wooziness or decreased postural stability.    Baseline 10/26: Pt reports increased wooziness with bending and FOF/unsteadiness    Time 12    Period Weeks    Status New    Target Date 06/18/21                   Plan - 04/16/21 1708     Clinical Impression Statement Pt continues to be TTP with manual therapy and reports continued HA outside of PT. PT also assessed pt's UE strength and found significant deficits in RTC ER/IRs B. PT added shoulder strengthening interventions to HEP to promote muscular stability near cervical region and for pain modulation. Pt continues to report some dizziness with VORx1 with both vertical and horizontal head turns with busy background, reaching 3/10, and at times describes it as a light-headed sensation. The pt will benefit from further skilled PT to address the aforementioned deficits and to improve balance in order to increase QOL and decrease fall risk.    Personal Factors and Comorbidities Age;Past/Current Experience;Sex;Comorbidity 3+;Time since onset of injury/illness/exacerbation  Comorbidities PMH significant for allergic rhinitis, anxiety, HTN, glaucoma, IBS, pure hypercholesterolemia,  thoracic or lumbosacral neuritis or radiculitis, vaginal CA, OA, GERD, osteopenia, meningioma, OSA    Examination-Activity Limitations Bathing;Bed Mobility;Bend;Stairs;Sit;Sleep;Locomotion Level;Hygiene/Grooming    Examination-Participation Restrictions Occupation;Yard Work;Cleaning;Community Activity;Shop    Stability/Clinical Decision Making Evolving/Moderate complexity    Rehab Potential Good    PT Frequency 2x / week    PT Duration 12 weeks    PT Treatment/Interventions ADLs/Self Care Home Management;Biofeedback;Canalith Repostioning;Cryotherapy;Electrical Stimulation;Moist Heat;Traction;Ultrasound;DME Instruction;Gait training;Stair training;Functional mobility training;Therapeutic activities;Therapeutic exercise;Balance training;Neuromuscular re-education;Patient/family education;Manual techniques;Dry needling;Passive range of motion;Energy conservation;Splinting;Taping;Vestibular;Visual/perceptual remediation/compensation;Joint Manipulations    PT Next Visit Plan orthostatics, DGI, 10MWT, gaze stabilization, balance interventions, manual therapy and therex to address cervical mobility    PT Home Exercise Plan Access Code: [email or web address], 11/2: increase performance of seated VOR to 5 min (performed in 30 sec bouts); 11/8: Access Code: [email or web address]; 11/16: Access Code: [email or web address]    Consulted and Agree with Plan of Care Patient             Patient will benefit from skilled therapeutic intervention in order to improve the following deficits and impairments:  Pain, Dizziness, Decreased range of motion, Hypomobility, Improper body mechanics, Decreased activity tolerance, Decreased balance, Decreased mobility, Increased muscle spasms, Impaired flexibility, Impaired vision/preception, Postural dysfunction  Visit Diagnosis: Dizziness and giddiness  Muscle weakness (generalized)  Unsteadiness on feet     Problem List Patient Active Problem List   Diagnosis Date Noted   Pulsatile tinnitus of right  ear 06/18/2020   Pulsatile neck mass 06/18/2020   OSA (obstructive sleep apnea) 09/19/2019   Anxiety and depression 09/19/2019   Left thyroid nodule 08/08/2019   Neck pain 01/03/2019   Meningioma ([place]) 85/27/7824   Lichen sclerosus 23/53/6144   Vaginal atrophy 03/18/2016   Encounter for follow-up surveillance of vaginal cancer 03/18/2016   Eczema 10/31/2015   Left hand pain 09/10/2015   Osteopenia after menopause 07/26/2015   Menopause 07/26/2015   Irritable colon 07/26/2015   History of cancer of vagina 07/26/2015   Diffuse cystic mastopathy 07/26/2015   Obesity (BMI 30.0-34.9) 07/26/2015   Intermittent low back pain 07/26/2015   Hyperlipemia 07/26/2015   Osteoarthritis 05/13/2015   Allergic rhinitis 05/13/2015   Chronic GERD 05/13/2015   Essential hypertension, benign     Zollie Pee, PT 04/16/2021, 5:12 PM  [place] [facility] SERVICES [address] [place], [place], [address] Phone: [phone]   Fax:  [phone]  Name: Audrielle Vankuren MRN: [id number] Date of Birth: 1946/01/11

## 2021-04-22 ENCOUNTER — Ambulatory Visit
Admission: RE | Admit: 2021-04-22 | Discharge: 2021-04-22 | Disposition: A | Discharge status: Home / Self Care | Payer: Medicare PPO | Source: Ambulatory Visit | Attending: Neurosurgery | Admitting: Neurosurgery

## 2021-04-22 ENCOUNTER — Ambulatory Visit: Payer: Medicare PPO

## 2021-04-22 DIAGNOSIS — G930 Cerebral cysts: Secondary | ICD-10-CM | POA: Diagnosis not present

## 2021-04-22 DIAGNOSIS — D329 Benign neoplasm of meninges, unspecified: Secondary | ICD-10-CM | POA: Diagnosis not present

## 2021-04-22 DIAGNOSIS — D320 Benign neoplasm of cerebral meninges: Secondary | ICD-10-CM | POA: Diagnosis not present

## 2021-04-22 DIAGNOSIS — I6782 Cerebral ischemia: Secondary | ICD-10-CM | POA: Diagnosis not present

## 2021-04-22 DIAGNOSIS — G936 Cerebral edema: Secondary | ICD-10-CM | POA: Diagnosis not present

## 2021-04-22 MED ORDER — GADOBUTROL 1 MMOL/ML IV SOLN: 8.0000 mL | Freq: Once | INTRAVENOUS | Status: DC | PRN

## 2021-04-22 MED ORDER — GADOBUTROL 1 MMOL/ML IV SOLN
7.5000 mL | Freq: Once | INTRAVENOUS | Status: AC | PRN
  Administered 2021-04-22: 7.5 mL via INTRAVENOUS

## 2021-04-23 ENCOUNTER — Ambulatory Visit: Payer: Medicare PPO

## 2021-04-23 ENCOUNTER — Other Ambulatory Visit: Payer: Self-pay

## 2021-04-23 DIAGNOSIS — R2681 Unsteadiness on feet: Secondary | ICD-10-CM

## 2021-04-23 DIAGNOSIS — R2689 Other abnormalities of gait and mobility: Secondary | ICD-10-CM | POA: Diagnosis not present

## 2021-04-23 DIAGNOSIS — R42 Dizziness and giddiness: Secondary | ICD-10-CM

## 2021-04-23 DIAGNOSIS — M6281 Muscle weakness (generalized): Secondary | ICD-10-CM | POA: Diagnosis not present

## 2021-04-23 DIAGNOSIS — M542 Cervicalgia: Secondary | ICD-10-CM | POA: Diagnosis not present

## 2021-04-23 NOTE — Therapy (Signed)
[place] MAIN [facility] SERVICES [address] [place], [place], [address] Phone: [phone]   Fax:  [phone]  Physical Therapy Treatment  Patient Details  Name: Vickie Stewart MRN: [id number] Date of Birth: 07-27-1945 Referring Provider (PT): Vladimir Crofts, MD   Encounter Date: 04/23/2021   PT End of Session - 04/23/21 1855     Visit Number 5    Number of Visits 25    Date for PT Re-Evaluation 06/18/21    Authorization Type eval completed 03/26/2021    PT Start Time 1519    PT Stop Time 1559    PT Time Calculation (min) 40 min    Equipment Utilized During Treatment Gait belt    Activity Tolerance Patient tolerated treatment well;No increased pain    Behavior During Therapy WFL for tasks assessed/performed             Past Medical History:  Diagnosis Date   Allergic rhinitis, cause unspecified    Anxiety state, unspecified    Contact dermatitis and other eczema, due to unspecified cause    Diffuse cystic mastopathy    Dyspepsia and other specified disorders of function of stomach    Essential hypertension, benign    Glaucoma    Irritable bowel syndrome    Lateral epicondylitis  of elbow    Leukocytopenia, unspecified    Mastodynia    Obesity, unspecified    Pure hypercholesterolemia    Reflux esophagitis    Thoracic or lumbosacral neuritis or radiculitis, unspecified    Unspecified disorder of skin and subcutaneous tissue    Vaginal cancer ([place])    History    Past Surgical History:  Procedure Laterality Date   BREAST EXCISIONAL BIOPSY Left    neg   BREAST LUMPECTOMY Left    COLONOSCOPY  08/26/2006   COLONOSCOPY WITH PROPOFOL N/A 10/21/2016   Procedure: COLONOSCOPY WITH PROPOFOL;  Surgeon: Christene Lye, MD;  Location: [facility] ENDOSCOPY;  Service: Endoscopy;  Laterality: N/A;   ESOPHAGOGASTRODUODENOSCOPY (EGD) WITH PROPOFOL N/A 02/07/2020   Procedure: ESOPHAGOGASTRODUODENOSCOPY (EGD) WITH PROPOFOL;   Surgeon: Lin Landsman, MD;  [facility] END[facility]COPY;  Service: Gastroenterology;  Laterality: N/A;    There were no vitals filed for this visit.   Subjective Assessment - 04/23/21 1519     Subjective Pt reports dizziness is overall unchanged. Pt has been performing HEP. She reports she was sitting down a lot on Sunday and that it made her headache worse, she believes activity levels are related to intensity of her symptoms (worse when sedentary). She reports the pressure makes her feel foggy. Pt reports no falls or stumbles. She reports some symptoms when she gets up. She does describe this as sometimes feeling lightheaded.    Pertinent History Pt is a 75 yo female presenting to PT with c/o of neck pain, HA and dizziness sx that started >1 year ago. Pt previously seen in PT for neck pain, reports it improved but never fully resolved. Pt has been diagnosed with a "pinched nerve." Prolonged sitting increases her sx which also includes wooziness. Bending increases wooziness. Current pain is 6-7/10. Pt denies any light sensitivty. Pt does have hx of vertigo but reports these symptoms are different. She has difficulty lying on her L side (same side as HA) and can exerpience a "light buzz" sensation. Pt also experiences ringing in her ears sometimes. hx of chronic sinus problems. No falls in the last 6 months. Movement improves sx. Other PMH significant allergic  rhinitis, anxiety, HTN, glaucoma, IBS, pure hypercholesterolemia, thoracic or lumbosacral neuritis or radiculitis, vaginal CA, OA, GERD, osteopenia, meningioma, OSA    Limitations Sitting;Lifting;House hold activities   lifting/household activities limited with positions requiring bending   How long can you sit comfortably? Limited due to increased neck pain/HA. Reports unsure but maybe able to sit for a max of a couple hours if she has been active throughout the day.    How long can you stand comfortably? Pt repots only limited if standing in  the shower    How long can you walk comfortably? Pt reports not limited    Diagnostic tests MR BRAIN 04/20/2019:    IMPRESSION:  Right para falcine meningioma is stable from 1 year ago. No mass in  the brain.     Mild chronic microvascular ischemic changes in the white matter also  stable.  CT ANGIO HEAD 2021: "IMPRESSION:  No large vessel occlusion, high-grade narrowing, dissection or  aneurysm.     No acute intracranial finding. 2.0 cm right para falcine meningioma  is unchanged." and CT ANGIO NECK 2021: "IMPRESSION:  No large vessel occlusion, high-grade narrowing, dissection or  aneurysm.     No acute intracranial finding. 2.0 cm right para falcine meningioma  is unchanged."    Patient Stated Goals Pt would like to be able to sit as long as she'd like    Currently in Pain? Yes   headache (chronic)   Pain Onset More than a month ago            INTERVENTIONS  Orthostatics reassessed as pt reports having symptoms with "getting up."  Supine-  152/66 mmHg Seated- 140/63 mmHg Standing- 148/67 mmHg; initially reports light-headedness with standing. Pt does report her BP is higher when taken at appointments. PT instructs pt to take her BP at home when going from a laying down position to a sitting up position. Pt verbalizes understanding.    Pt supine on plinth: PT provides STM and TrP release to B cervical paraspinals and posterior shoulder musculature, B upper traps. Pt only TTP along cervical paraspinals today.   PT also provides gentle cervical traction and STM to musculature of occipital triangle.  During STM PT brings pt through the following stretches: Upper trap B x 30 sec  While pt still supine on plinth, pt performs chin tucks with manual resistance into PT's hands 10x with 3 sec holds Comments: Pt reports no wooziness. Extensive cuing for technique, where pt exhibits improved technique following cues.  Seated: RTB rows 2x10 RTB pull aparts for ER 2x15 Comments: PT provides VC/TC  and demo for technique, particularly with rows. PT demo's good technique following cuing.   CGA provided throughout unless otherwise noted:    Standing on airex pad EC 4x30 sec with progressively narrower BOS - intermittent UE support as BOS decreases.   SLB 2x30 sec each LE; intermittent UE support, improves with second set and able to maintain SLB for up to 15 sec   Semi-tandem stance on airex 2x30 sec each LE  Tandem stance on airex  2x30 sec each LE; frequent use of UE support, rates difficult   Pt educated throughout session about proper posture and technique with exercises. Improved exercise technique, movement at target joints, use of target muscles after min to mod verbal, visual, tactile cues    PT Education - 04/23/21 1855     Education Details exercise technique (review of HEP)    Person(s) Educated Patient    Methods  Explanation;Demonstration;Verbal cues;Tactile cues    Comprehension Verbalized understanding;Returned demonstration;Need further instruction              PT Short Term Goals - 03/27/21 1254       PT SHORT TERM GOAL #1   Title Pt will be independent with HEP in order to improve strength and balance in order to decrease fall risk and improve function at home and in the community    Baseline 10/27: issued seated VOR and cervical rotation stretch    Time 6    Period Weeks    Status New    Target Date 05/07/21               PT Long Term Goals - 04/02/21 1720       PT LONG TERM GOAL #1   Title Pt will decrease DHI score by at least 18 points in order to demonstrate clinically significant reduction in disability    Baseline 10/26: 22/100    Time 12    Period Weeks    Status New    Target Date 06/18/21      PT LONG TERM GOAL #2   Title Patient will increase FOTO score by at least 10 points to demonstrate improvement in mobility and quality of life.    Baseline 10/26: 65    Time 12    Period Weeks    Status New    Target Date 06/18/21       PT LONG TERM GOAL #3   Title Pt will improve DGI by at least 2 points in order to demonstrate improvement in balance and decreased risk for falls.    Baseline 10/26: to be tested in next 1-2 sessions; 11/2: 22/24    Time 12    Period Weeks    Status Revised    Target Date 06/18/21      PT LONG TERM GOAL #4   Title Pt will report a worst neck pain of no greater than 3/10 within the past two weeks in order to improve QOL.    Baseline 10/27: pain currently 6-7/10.    Time 12    Period Weeks    Status New    Target Date 06/18/21      PT LONG TERM GOAL #5   Title Pt will report ability to bend down/over to complete yard work/ADLs without wooziness or decreased postural stability.    Baseline 10/26: Pt reports increased wooziness with bending and FOF/unsteadiness    Time 12    Period Weeks    Status New    Target Date 06/18/21                   Plan - 04/23/21 1856     Clinical Impression Statement Orthostatics assessed on this date as pt reports occassoinal lightheadedness and symptoms "with getting up." Pt only saw 12 pt drop with systolic BP when transitioning from supine to seated: 152/66 mmHg to 140/63 mmHg, with no reports of dizziness. However, while pt's BP did increase transitioning from seated>standing pt did report feeling lightheaded. Pt reports her BP is typically high when taken at appointments. PT instructs pt to retake her BP in supine and seated positions when at home. Pt verbalized understanding. Remainder of session continued to focus manual therapy to cervical and shoulder musculature, shoulder strengthening and balance. Pt was most challenged with tandem stance on compliant surface. The pt will benefit from further skilled PT to address these deficits in order to improve  balance and QOL.    Personal Factors and Comorbidities Age;Past/Current Experience;Sex;Comorbidity 3+;Time since onset of injury/illness/exacerbation    Comorbidities PMH significant for  allergic rhinitis, anxiety, HTN, glaucoma, IBS, pure hypercholesterolemia, thoracic or lumbosacral neuritis or radiculitis, vaginal CA, OA, GERD, osteopenia, meningioma, OSA    Examination-Activity Limitations Bathing;Bed Mobility;Bend;Stairs;Sit;Sleep;Locomotion Level;Hygiene/Grooming    Examination-Participation Restrictions Occupation;Yard Work;Cleaning;Community Activity;Shop    Stability/Clinical Decision Making Evolving/Moderate complexity    Rehab Potential Good    PT Frequency 2x / week    PT Duration 12 weeks    PT Treatment/Interventions ADLs/Self Care Home Management;Biofeedback;Canalith Repostioning;Cryotherapy;Electrical Stimulation;Moist Heat;Traction;Ultrasound;DME Instruction;Gait training;Stair training;Functional mobility training;Therapeutic activities;Therapeutic exercise;Balance training;Neuromuscular re-education;Patient/family education;Manual techniques;Dry needling;Passive range of motion;Energy conservation;Splinting;Taping;Vestibular;Visual/perceptual remediation/compensation;Joint Manipulations    PT Next Visit Plan orthostatics, DGI, 10MWT, gaze stabilization, balance interventions, manual therapy and therex to address cervical mobility, shoulder strengthening    PT Home Exercise Plan Access Code: [email or web address], 11/2: increase performance of seated VOR to 5 min (performed in 30 sec bouts); 11/8: Access Code: [email or web address]; 11/16: Access Code: [email or web address]; no updates    Consulted and Agree with Plan of Care Patient             Patient will benefit from skilled therapeutic intervention in order to improve the following deficits and impairments:  Pain, Dizziness, Decreased range of motion, Hypomobility, Improper body mechanics, Decreased activity tolerance, Decreased balance, Decreased mobility, Increased muscle spasms, Impaired flexibility, Impaired vision/preception, Postural dysfunction  Visit Diagnosis: Muscle weakness (generalized)  Dizziness and giddiness  Unsteadiness on  feet     Problem List Patient Active Problem List   Diagnosis Date Noted   Pulsatile tinnitus of right ear 06/18/2020   Pulsatile neck mass 06/18/2020   OSA (obstructive sleep apnea) 09/19/2019   Anxiety and depression 09/19/2019   Left thyroid nodule 08/08/2019   Neck pain 01/03/2019   Meningioma ([place]) 29/92/4268   Lichen sclerosus 34/19/6222   Vaginal atrophy 03/18/2016   Encounter for follow-up surveillance of vaginal cancer 03/18/2016   Eczema 10/31/2015   Left hand pain 09/10/2015   Osteopenia after menopause 07/26/2015   Menopause 07/26/2015   Irritable colon 07/26/2015   History of cancer of vagina 07/26/2015   Diffuse cystic mastopathy 07/26/2015   Obesity (BMI 30.0-34.9) 07/26/2015   Intermittent low back pain 07/26/2015   Hyperlipemia 07/26/2015   Osteoarthritis 05/13/2015   Allergic rhinitis 05/13/2015   Chronic GERD 05/13/2015   Essential hypertension, benign     Zollie Pee, PT 04/23/2021, 7:03 PM  [place] MAIN [facility] SERVICES [place], [place], [address] Phone: [phone]   Fax:  [phone]  Name: Vickie Stewart MRN: [id number] Date of Birth: February 24, 1946

## 2021-04-30 ENCOUNTER — Ambulatory Visit: Payer: Medicare PPO

## 2021-04-30 ENCOUNTER — Other Ambulatory Visit: Payer: Self-pay

## 2021-04-30 DIAGNOSIS — M6281 Muscle weakness (generalized): Secondary | ICD-10-CM | POA: Diagnosis not present

## 2021-04-30 DIAGNOSIS — R2681 Unsteadiness on feet: Secondary | ICD-10-CM | POA: Diagnosis not present

## 2021-04-30 DIAGNOSIS — M542 Cervicalgia: Secondary | ICD-10-CM | POA: Diagnosis not present

## 2021-04-30 DIAGNOSIS — R42 Dizziness and giddiness: Secondary | ICD-10-CM | POA: Diagnosis not present

## 2021-04-30 DIAGNOSIS — R2689 Other abnormalities of gait and mobility: Secondary | ICD-10-CM | POA: Diagnosis not present

## 2021-04-30 NOTE — Therapy (Cosign Needed Addendum)
[facility] MAIN [facility] SERVICES [place], [place], [address] Phone: [phone]   Fax:  [phone]  Physical Therapy Treatment  Patient Details  Name: Vickie Stewart MRN: [id number] Date of Birth: 1946-03-05 Referring Provider (PT): Vladimir Crofts, MD   Encounter Date: 04/30/2021   PT End of Session - 04/30/21 1112     Visit Number 6    Number of Visits 25    Date for PT Re-Evaluation 06/18/21    Authorization Type eval completed 03/26/2021    PT Start Time 0916    PT Stop Time 0959    PT Time Calculation (min) 43 min    Equipment Utilized During Treatment Gait belt    Activity Tolerance Patient tolerated treatment well    Behavior During Therapy WFL for tasks assessed/performed             Past Medical History:  Diagnosis Date   Allergic rhinitis, cause unspecified    Anxiety state, unspecified    Contact dermatitis and other eczema, due to unspecified cause    Diffuse cystic mastopathy    Dyspepsia and other specified disorders of function of stomach    Essential hypertension, benign    Glaucoma    Irritable bowel syndrome    Lateral epicondylitis  of elbow    Leukocytopenia, unspecified    Mastodynia    Obesity, unspecified    Pure hypercholesterolemia    Reflux esophagitis    Thoracic or lumbosacral neuritis or radiculitis, unspecified    Unspecified disorder of skin and subcutaneous tissue    Vaginal cancer ([place])    History    Past Surgical History:  Procedure Laterality Date   BREAST EXCISIONAL BIOPSY Left    neg   BREAST LUMPECTOMY Left    COLONOSCOPY  08/26/2006   COLONOSCOPY WITH PROPOFOL N/A 10/21/2016   Procedure: COLONOSCOPY WITH PROPOFOL;  Surgeon: Christene Lye, MD;  Location: [facility] ENDOSCOPY;  Service: Endoscopy;  Laterality: N/A;   ESOPHAGOGASTRODUODENOSCOPY (EGD) WITH PROPOFOL N/A 02/07/2020   Procedure: ESOPHAGOGASTRODUODENOSCOPY (EGD) WITH PROPOFOL;  Surgeon: Lin Landsman, MD;  Location: [facility] ENDOSCOPY;  Service: Gastroenterology;  Laterality: N/A;    There were no vitals filed for this visit.   Subjective Assessment - 04/30/21 0916     Subjective Pt reports some pressure when she sits down in the back of her neck more noticeable on the left side that comes on with about an hour of sitting. Pt reports some lightheadedness this morning but stated that moving her neck around helped her symptoms. She denies any pain and has had no recent falls or stumbles.    Pertinent History Pt is a 75 yo female presenting to PT with c/o of neck pain, HA and dizziness sx that started >1 year ago. Pt previously seen in PT for neck pain, reports it improved but never fully resolved. Pt has been diagnosed with a "pinched nerve." Prolonged sitting increases her sx which also includes wooziness. Bending increases wooziness. Current pain is 6-7/10. Pt denies any light sensitivty. Pt does have hx of vertigo but reports these symptoms are different. She has difficulty lying on her L side (same side as HA) and can exerpience a "light buzz" sensation. Pt also experiences ringing in her ears sometimes. hx of chronic sinus problems. No falls in the last 6 months. Movement improves sx. Other PMH significant allergic rhinitis, anxiety, HTN, glaucoma, IBS, pure hypercholesterolemia, thoracic or lumbosacral neuritis or radiculitis, vaginal CA, OA, GERD,  osteopenia, meningioma, OSA    Limitations Sitting;Lifting;House hold activities   lifting/household activities limited with positions requiring bending   How long can you sit comfortably? Limited due to increased neck pain/HA. Reports unsure but maybe able to sit for a max of a couple hours if she has been active throughout the day.    How long can you stand comfortably? Pt repots only limited if standing in the shower    How long can you walk comfortably? Pt reports not limited    Diagnostic tests MR BRAIN 04/20/2019:    IMPRESSION:  Right para  falcine meningioma is stable from 1 year ago. No mass in  the brain.     Mild chronic microvascular ischemic changes in the white matter also  stable.  CT ANGIO HEAD 2021: "IMPRESSION:  No large vessel occlusion, high-grade narrowing, dissection or  aneurysm.     No acute intracranial finding. 2.0 cm right para falcine meningioma  is unchanged." and CT ANGIO NECK 2021: "IMPRESSION:  No large vessel occlusion, high-grade narrowing, dissection or  aneurysm.     No acute intracranial finding. 2.0 cm right para falcine meningioma  is unchanged."    Patient Stated Goals Pt would like to be able to sit as long as she'd like    Currently in Pain? No/denies    Pain Onset More than a month ago               INTERVENTIONS   Pt supine on plinth: PT provides STM and TrP release to B cervical paraspinals and posterior shoulder musculature, B upper trap. Pt also provides gentle cervical traction and STM to musculature of occipital triangle.    Chin tucks with manual resistance into PT's hands 10x with 3 sec holds  Seated upper trap stretch 2 x 30 sec each side.   RTB ER pull aparts - 3x15 reps, pt reports some aching posterior neck  RTB rows 3 x 15 reps   CGA provided throughout unless otherwise noted:   Pt standing, WBOS, VORx1 with horizontal head turns, busy background  (5 ft from target)- 2x30 sec; pt reports dizziness as 2/10  Pt standing, NBOS, VORx1 with horizontal head turns, busy background (5 ft from target) -  2x30 sec; dizziness remains 2/10 Pt standing, NBOS, VORx1 with busy background, vertical head turns (5 ft from target) - 2x30 sec; reports dizziness slightly increased to 3/10   Dynamic balance, gait with horizontal head turns down hallway 4x; pt reports slight unsteadiness and feels that she is deviating to the left   Dynamic balance, gait with vertical head turns down hallway 4x; continued reports of unsteadiness with no dizziness reported  Dynamic balance, gait with eyes  closed down hallway 4x; pt reports feelings of unsteadiness and displays significant rightward deviation. Pt improves rightward deviation with trials.   On airex pad:  NBOS EC 3x30 sec; intermittent UE support NBOS, EO with horizontal head turns 2 x 30 sec. Pt displays some lateral sway, but has no LOB  NBOS, EO with vertical head turns 2 x 30 sec. Pt displays anterior/posterior sway and states she feels more unsteady than doing horizontal head turns.          PT Short Term Goals - 03/27/21 1254       PT SHORT TERM GOAL #1   Title Pt will be independent with HEP in order to improve strength and balance in order to decrease fall risk and improve function at home and in the  community    Baseline 10/27: issued seated VOR and cervical rotation stretch    Time 6    Period Weeks    Status New    Target Date 05/07/21               PT Long Term Goals - 04/02/21 1720       PT LONG TERM GOAL #1   Title Pt will decrease DHI score by at least 18 points in order to demonstrate clinically significant reduction in disability    Baseline 10/26: 22/100    Time 12    Period Weeks    Status New    Target Date 06/18/21      PT LONG TERM GOAL #2   Title Patient will increase FOTO score by at least 10 points to demonstrate improvement in mobility and quality of life.    Baseline 10/26: 65    Time 12    Period Weeks    Status New    Target Date 06/18/21      PT LONG TERM GOAL #3   Title Pt will improve DGI by at least 2 points in order to demonstrate improvement in balance and decreased risk for falls.    Baseline 10/26: to be tested in next 1-2 sessions; 11/2: 22/24    Time 12    Period Weeks    Status Revised    Target Date 06/18/21      PT LONG TERM GOAL #4   Title Pt will report a worst neck pain of no greater than 3/10 within the past two weeks in order to improve QOL.    Baseline 10/27: pain currently 6-7/10.    Time 12    Period Weeks    Status New    Target Date  06/18/21      PT LONG TERM GOAL #5   Title Pt will report ability to bend down/over to complete yard work/ADLs without wooziness or decreased postural stability.    Baseline 10/26: Pt reports increased wooziness with bending and FOF/unsteadiness    Time 12    Period Weeks    Status New    Target Date 06/18/21                   Plan - 04/30/21 1113     Clinical Impression Statement Pt presented to PT today with great motivation for all interventions. Pt responded well to manual therapy to the cervical and shoulder musculature. Pt was able to progress strengthening to RTB exercises, and reports decreased "wooziness" with VOR horizontal exercises. Pt was most challenged with ambulation with eyes closed and displayed a significant rightward drift. The pt will benefit from further skilled PT to address these deficits in order to improve balance and QOL and decrease fall risk    Personal Factors and Comorbidities Age;Past/Current Experience;Sex;Comorbidity 3+;Time since onset of injury/illness/exacerbation    Comorbidities PMH significant for allergic rhinitis, anxiety, HTN, glaucoma, IBS, pure hypercholesterolemia, thoracic or lumbosacral neuritis or radiculitis, vaginal CA, OA, GERD, osteopenia, meningioma, OSA    Examination-Activity Limitations Bathing;Bed Mobility;Bend;Stairs;Sit;Sleep;Locomotion Level;Hygiene/Grooming    Examination-Participation Restrictions Occupation;Yard Work;Cleaning;Community Activity;Shop    Stability/Clinical Decision Making Evolving/Moderate complexity    Rehab Potential Good    PT Frequency 2x / week    PT Duration 12 weeks    PT Treatment/Interventions ADLs/Self Care Home Management;Biofeedback;Canalith Repostioning;Cryotherapy;Electrical Stimulation;Moist Heat;Traction;Ultrasound;DME Instruction;Gait training;Stair training;Functional mobility training;Therapeutic activities;Therapeutic exercise;Balance training;Neuromuscular re-education;Patient/family  education;Manual techniques;Dry needling;Passive range of motion;Energy conservation;Splinting;Taping;Vestibular;Visual/perceptual remediation/compensation;Joint Manipulations  PT Next Visit Plan orthostatics, DGI, 10MWT, gaze stabilization, balance interventions, manual therapy and therex to address cervical mobility, shoulder strengthening    PT Home Exercise Plan Access Code: [email or web address], 11/2: increase performance of seated VOR to 5 min (performed in 30 sec bouts); 11/8: Access Code: [email or web address]; 11/16: Access Code: [email or web address]; no updates    Consulted and Agree with Plan of Care Patient             Patient will benefit from skilled therapeutic intervention in order to improve the following deficits and impairments:  Pain, Dizziness, Decreased range of motion, Hypomobility, Improper body mechanics, Decreased activity tolerance, Decreased balance, Decreased mobility, Increased muscle spasms, Impaired flexibility, Impaired vision/preception, Postural dysfunction  Visit Diagnosis: Dizziness and giddiness  Unsteadiness on feet  Muscle weakness (generalized)     Problem List Patient Active Problem List   Diagnosis Date Noted   Pulsatile tinnitus of right ear 06/18/2020   Pulsatile neck mass 06/18/2020   OSA (obstructive sleep apnea) 09/19/2019   Anxiety and depression 09/19/2019   Left thyroid nodule 08/08/2019   Neck pain 01/03/2019   Meningioma ([place]) 43/14/2767   Lichen sclerosus 06/11/347   Vaginal atrophy 03/18/2016   Encounter for follow-up surveillance of vaginal cancer 03/18/2016   Eczema 10/31/2015   Left hand pain 09/10/2015   Osteopenia after menopause 07/26/2015   Menopause 07/26/2015   Irritable colon 07/26/2015   History of cancer of vagina 07/26/2015   Diffuse cystic mastopathy 07/26/2015   Obesity (BMI 30.0-34.9) 07/26/2015   Intermittent low back pain 07/26/2015   Hyperlipemia 07/26/2015   Osteoarthritis 05/13/2015   Allergic rhinitis 05/13/2015   Chronic GERD  05/13/2015   Essential hypertension, benign   Ricard Dillon PT, DPT This entire session was performed under direct supervision and direction of a licensed Chiropractor . I have personally read, edited and approve of the note as written.   Karn Cassis, SPT 04/30/2021, 12:55 PM  [place] MAIN [facility] SERVICES [address] [place], [place], [address] Phone: [phone]   Fax:  [phone]  Name: Vickie Stewart MRN: [id number] Date of Birth: 16-Dec-1945

## 2021-05-05 ENCOUNTER — Ambulatory Visit: Payer: Medicare PPO | Attending: Neurology

## 2021-05-05 ENCOUNTER — Other Ambulatory Visit: Payer: Self-pay

## 2021-05-05 DIAGNOSIS — R42 Dizziness and giddiness: Secondary | ICD-10-CM | POA: Diagnosis not present

## 2021-05-05 DIAGNOSIS — M6281 Muscle weakness (generalized): Secondary | ICD-10-CM | POA: Diagnosis not present

## 2021-05-05 DIAGNOSIS — M542 Cervicalgia: Secondary | ICD-10-CM | POA: Insufficient documentation

## 2021-05-05 DIAGNOSIS — R2681 Unsteadiness on feet: Secondary | ICD-10-CM | POA: Insufficient documentation

## 2021-05-05 NOTE — Therapy (Addendum)
[place] MAIN [facility] SERVICES [address] [place], [place], [address] Phone: [phone]   Fax:  [phone]  Physical Therapy Treatment  Patient Details  Name: Vickie Stewart MRN: [id number] Date of Birth: [birth year] Referring Provider (PT): Vladimir Crofts, MD   Encounter Date: 05/05/2021   PT End of Session - 05/05/21 1057     Visit Number 7    Number of Visits 25    Date for PT Re-Evaluation 06/18/21    Authorization Type eval completed 03/26/2021    PT Start Time 0959    PT Stop Time 1044    PT Time Calculation (min) 45 min    Equipment Utilized During Treatment Gait belt    Activity Tolerance Patient tolerated treatment well    Behavior During Therapy WFL for tasks assessed/performed             Past Medical History:  Diagnosis Date   Allergic rhinitis, cause unspecified    Anxiety state, unspecified    Contact dermatitis and other eczema, due to unspecified cause    Diffuse cystic mastopathy    Dyspepsia and other specified disorders of function of stomach    Essential hypertension, benign    Glaucoma    Irritable bowel syndrome    Lateral epicondylitis  of elbow    Leukocytopenia, unspecified    Mastodynia    Obesity, unspecified    Pure hypercholesterolemia    Reflux esophagitis    Thoracic or lumbosacral neuritis or radiculitis, unspecified    Unspecified disorder of skin and subcutaneous tissue    Vaginal cancer ([place])    History    Past Surgical History:  Procedure Laterality Date   BREAST EXCISIONAL BIOPSY Left    neg   BREAST LUMPECTOMY Left    COLONOSCOPY  08/26/2006   COLONOSCOPY WITH PROPOFOL N/A 10/21/2016   Procedure: COLONOSCOPY WITH PROPOFOL;  Surgeon: Christene Lye, MD;  Location: [facility] ENDOSCOPY;  Service: Endoscopy;  Laterality: N/A;   ESOPHAGOGASTRODUODENOSCOPY (EGD) WITH PROPOFOL N/A 02/07/2020   Procedure: ESOPHAGOGASTRODUODENOSCOPY (EGD) WITH PROPOFOL;  Surgeon: Lin Landsman, MD;  Location: [facility] ENDOSCOPY;  Service: Gastroenterology;  Laterality: N/A;    There were no vitals filed for this visit.   Subjective Assessment - 05/05/21 0958     Subjective Pt reports her dizziness is doing better and she has been completing her HEP every morning except Sunday. She notices less dizziness since prioritizing vertical VOR. Pt states she has woken up earlier than usual the past couple mornings, but continues to wear her CPAP every night. She reports some soreness in her neck and shoulders from her scapular exercises, but denies pain.    Pertinent History Pt is a 75 yo female presenting to PT with c/o of neck pain, HA and dizziness sx that started >1 year ago. Pt previously seen in PT for neck pain, reports it improved but never fully resolved. Pt has been diagnosed with a "pinched nerve." Prolonged sitting increases her sx which also includes wooziness. Bending increases wooziness. Current pain is 6-7/10. Pt denies any light sensitivty. Pt does have hx of vertigo but reports these symptoms are different. She has difficulty lying on her L side (same side as HA) and can exerpience a "light buzz" sensation. Pt also experiences ringing in her ears sometimes. hx of chronic sinus problems. No falls in the last 6 months. Movement improves sx. Other PMH significant allergic rhinitis, anxiety, HTN, glaucoma, IBS, pure hypercholesterolemia, thoracic or lumbosacral neuritis  or radiculitis, vaginal CA, OA, GERD, osteopenia, meningioma, OSA    Limitations Sitting;Lifting;House hold activities   lifting/household activities limited with positions requiring bending   How long can you sit comfortably? Limited due to increased neck pain/HA. Reports unsure but maybe able to sit for a max of a couple hours if she has been active throughout the day.    How long can you stand comfortably? Pt repots only limited if standing in the shower    How long can you walk comfortably? Pt reports not limited     Diagnostic tests MR BRAIN 04/20/2019:    IMPRESSION:  Right para falcine meningioma is stable from 1 year ago. No mass in  the brain.     Mild chronic microvascular ischemic changes in the white matter also  stable.  CT ANGIO HEAD 2021: "IMPRESSION:  No large vessel occlusion, high-grade narrowing, dissection or  aneurysm.     No acute intracranial finding. 2.0 cm right para falcine meningioma  is unchanged." and CT ANGIO NECK 2021: "IMPRESSION:  No large vessel occlusion, high-grade narrowing, dissection or  aneurysm.     No acute intracranial finding. 2.0 cm right para falcine meningioma  is unchanged."    Patient Stated Goals Pt would like to be able to sit as long as she'd like    Currently in Pain? No/denies    Pain Onset More than a month ago              INTERVENTIONS   Pt supine on plinth: PT provides STM and TrP release to B cervical paraspinals and posterior shoulder musculature, B upper trap. PT also provides gentle cervical traction and STM to musculature of occipital triangle.  Pt reports most tightness in her R levator and L cervical paraspinals   Chin tucks with manual resistance into PT's hands 10x with 3 sec holds  CGA provided throughout unless otherwise noted:   Pt standing, NBOS, VORx1 with vertical head turns, busy background (5 ft from target) -  2x30 sec; dizziness remains 2/10 Pt standing, NBOS on airex, VORx1 with busy background, vertical head turns (5 ft from target) - 2x30 sec; reports no dizziness Pt standing, NBOS on airex, VORx1 with horizontal head turns, busy background  (5 ft from target)- 2x30 sec; pt reports no dizziness   Dynamic balance, gait with horizontal head turns down hallway 2x; pt reports slight unsteadiness and displays minimal lateral deviation   Dynamic balance, gait with vertical head turns down hallway 4x; continued reports of unsteadiness with no dizziness reported   Dynamic balance, gait with eyes closed down hallway 2x; pt reports  feelings of unsteadiness and displays significant rightward deviation. Pt improves rightward deviation with trials.  Dynamic balance, gait with eyes closed and vertical head turns 4 x 15 ft. Pt displays gait deviation with wide BOS and reports moderate unsteadiness, but no dizziness.  Dynamic balance, gait with quick 180 degree turns 4 x 15 ft each side. Pt displays quick turn with no stumbles or falls. Minimal hesitation and wide BOS with first turn to the R. Pt reports mild lightheadedness rated 1/10  Dynamic balance, Semitandem walking with narrow BOS down hallway 4x. Pt reports feels of unsteadiness and requires step strategy to recover balance x 4. VC to look up and not at feet. Pt improves with trials.   Seated on dynadisc on chair:  Reaching toward 4 cones on floor placed in front and to the side x multiple reps to assess seated balance and  orthostatics with reaching. Pt reports some lightheadedness.  Pt supine on plinth: Finished with gentle STM and TrP release to B cervical paraspinals and posterior shoulder musculature, B upper trap to address any residual muscle tightness from interventions.         PT Education - 05/05/21 1057     Education Details exercise technique    Person(s) Educated Patient    Methods Explanation;Demonstration;Verbal cues    Comprehension Verbalized understanding;Returned demonstration;Need further instruction              PT Short Term Goals - 03/27/21 1254       PT SHORT TERM GOAL #1   Title Pt will be independent with HEP in order to improve strength and balance in order to decrease fall risk and improve function at home and in the community    Baseline 10/27: issued seated VOR and cervical rotation stretch    Time 6    Period Weeks    Status New    Target Date 05/07/21               PT Long Term Goals - 04/02/21 1720       PT LONG TERM GOAL #1   Title Pt will decrease DHI score by at least 18 points in order to demonstrate  clinically significant reduction in disability    Baseline 10/26: 22/100    Time 12    Period Weeks    Status New    Target Date 06/18/21      PT LONG TERM GOAL #2   Title Patient will increase FOTO score by at least 10 points to demonstrate improvement in mobility and quality of life.    Baseline 10/26: 65    Time 12    Period Weeks    Status New    Target Date 06/18/21      PT LONG TERM GOAL #3   Title Pt will improve DGI by at least 2 points in order to demonstrate improvement in balance and decreased risk for falls.    Baseline 10/26: to be tested in next 1-2 sessions; 11/2: 22/24    Time 12    Period Weeks    Status Revised    Target Date 06/18/21      PT LONG TERM GOAL #4   Title Pt will report a worst neck pain of no greater than 3/10 within the past two weeks in order to improve QOL.    Baseline 10/27: pain currently 6-7/10.    Time 12    Period Weeks    Status New    Target Date 06/18/21      PT LONG TERM GOAL #5   Title Pt will report ability to bend down/over to complete yard work/ADLs without wooziness or decreased postural stability.    Baseline 10/26: Pt reports increased wooziness with bending and FOF/unsteadiness    Time 12    Period Weeks    Status New    Target Date 06/18/21                   Plan - 05/05/21 1115     Clinical Impression Statement Pt displays decreases in dizziness with VOR exercises both with vertical and horizontal head turns. She is able to progress VOR exercises with no dizziness or lightheadedness. Pt able to progress dynamic balance today and is most challenged by ambulation with narrow base of support. The pt will benefit from further skilled PT to address these deficits in  order to improve balance and QOL and decrease fall risk.    Personal Factors and Comorbidities Age;Past/Current Experience;Sex;Comorbidity 3+;Time since onset of injury/illness/exacerbation    Comorbidities PMH significant for allergic rhinitis,  anxiety, HTN, glaucoma, IBS, pure hypercholesterolemia, thoracic or lumbosacral neuritis or radiculitis, vaginal CA, OA, GERD, osteopenia, meningioma, OSA    Examination-Activity Limitations Bathing;Bed Mobility;Bend;Stairs;Sit;Sleep;Locomotion Level;Hygiene/Grooming    Examination-Participation Restrictions Occupation;Yard Work;Cleaning;Community Activity;Shop    Stability/Clinical Decision Making Evolving/Moderate complexity    Rehab Potential Good    PT Frequency 2x / week    PT Duration 12 weeks    PT Treatment/Interventions ADLs/Self Care Home Management;Biofeedback;Canalith Repostioning;Cryotherapy;Electrical Stimulation;Moist Heat;Traction;Ultrasound;DME Instruction;Gait training;Stair training;Functional mobility training;Therapeutic activities;Therapeutic exercise;Balance training;Neuromuscular re-education;Patient/family education;Manual techniques;Dry needling;Passive range of motion;Energy conservation;Splinting;Taping;Vestibular;Visual/perceptual remediation/compensation;Joint Manipulations    PT Next Visit Plan orthostatics, DGI, 10MWT, gaze stabilization, balance interventions, manual therapy and therex to address cervical mobility, shoulder strengthening    PT Home Exercise Plan Access Code: [email or web address], 11/2: increase performance of seated VOR to 5 min (performed in 30 sec bouts); 11/8: Access Code: [email or web address]; 11/16: Access Code: [email or web address]; no updates    Consulted and Agree with Plan of Care Patient             Patient will benefit from skilled therapeutic intervention in order to improve the following deficits and impairments:  Pain, Dizziness, Decreased range of motion, Hypomobility, Improper body mechanics, Decreased activity tolerance, Decreased balance, Decreased mobility, Increased muscle spasms, Impaired flexibility, Impaired vision/preception, Postural dysfunction  Visit Diagnosis: Unsteadiness on feet  Dizziness and giddiness  Muscle weakness  (generalized)  Cervicalgia     Problem List Patient Active Problem List   Diagnosis Date Noted   Pulsatile tinnitus of right ear 06/18/2020   Pulsatile neck mass 06/18/2020   OSA (obstructive sleep apnea) 09/19/2019   Anxiety and depression 09/19/2019   Left thyroid nodule 08/08/2019   Neck pain 01/03/2019   Meningioma ([place]) 91/50/5697   Lichen sclerosus 94/80/1655   Vaginal atrophy 03/18/2016   Encounter for follow-up surveillance of vaginal cancer 03/18/2016   Eczema 10/31/2015   Left hand pain 09/10/2015   Osteopenia after menopause 07/26/2015   Menopause 07/26/2015   Irritable colon 07/26/2015   History of cancer of vagina 07/26/2015   Diffuse cystic mastopathy 07/26/2015   Obesity (BMI 30.0-34.9) 07/26/2015   Intermittent low back pain 07/26/2015   Hyperlipemia 07/26/2015   Osteoarthritis 05/13/2015   Allergic rhinitis 05/13/2015   Chronic GERD 05/13/2015   Essential hypertension, benign   Ricard Dillon PT, DPT This entire session was performed under direct supervision and direction of a licensed Chiropractor . I have personally read, edited and approve of the note as written.    Karn Cassis, SPT 05/05/2021, 11:23 AM  [place] MAIN [facility] SERVICES [address] [place], [place], [address] Phone: [phone]   Fax:  [phone]  Name: Rashawna Scoles MRN: [id number] Date of Birth: 11-07-1945

## 2021-05-07 ENCOUNTER — Ambulatory Visit: Payer: Medicare PPO

## 2021-05-07 ENCOUNTER — Other Ambulatory Visit: Payer: Self-pay

## 2021-05-07 DIAGNOSIS — M542 Cervicalgia: Secondary | ICD-10-CM | POA: Diagnosis not present

## 2021-05-07 DIAGNOSIS — M6281 Muscle weakness (generalized): Secondary | ICD-10-CM | POA: Diagnosis not present

## 2021-05-07 DIAGNOSIS — R42 Dizziness and giddiness: Secondary | ICD-10-CM

## 2021-05-07 DIAGNOSIS — R2681 Unsteadiness on feet: Secondary | ICD-10-CM

## 2021-05-07 NOTE — Therapy (Addendum)
[place] MAIN [facility] SERVICES [place], [place], [address] Phone: [phone]   Fax:  [phone]  Physical Therapy Treatment  Patient Details  Name: Vickie Stewart MRN: [id number] Date of Birth: 1945-06-21 Referring Provider (PT): Vladimir Crofts, MD   Encounter Date: 05/07/2021   PT End of Session - 05/07/21 1125     Visit Number 8    Number of Visits 25    Date for PT Re-Evaluation 06/18/21    Authorization Type eval completed 03/26/2021    PT Start Time 0914    PT Stop Time 0958    PT Time Calculation (min) 44 min    Equipment Utilized During Treatment Gait belt    Activity Tolerance Patient tolerated treatment well    Behavior During Therapy WFL for tasks assessed/performed             Past Medical History:  Diagnosis Date   Allergic rhinitis, cause unspecified    Anxiety state, unspecified    Contact dermatitis and other eczema, due to unspecified cause    Diffuse cystic mastopathy    Dyspepsia and other specified disorders of function of stomach    Essential hypertension, benign    Glaucoma    Irritable bowel syndrome    Lateral epicondylitis  of elbow    Leukocytopenia, unspecified    Mastodynia    Obesity, unspecified    Pure hypercholesterolemia    Reflux esophagitis    Thoracic or lumbosacral neuritis or radiculitis, unspecified    Unspecified disorder of skin and subcutaneous tissue    Vaginal cancer ([place])    History    Past Surgical History:  Procedure Laterality Date   BREAST EXCISIONAL BIOPSY Left    neg   BREAST LUMPECTOMY Left    COLONOSCOPY  08/26/2006   COLONOSCOPY WITH PROPOFOL N/A 10/21/2016   Procedure: COLONOSCOPY WITH PROPOFOL;  Surgeon: Christene Lye, MD;  Location: [facility] ENDOSCOPY;  Service: Endoscopy;  Laterality: N/A;   ESOPHAGOGASTRODUODENOSCOPY (EGD) WITH PROPOFOL N/A 02/07/2020   Procedure: ESOPHAGOGASTRODUODENOSCOPY (EGD) WITH PROPOFOL;  Surgeon: Lin Landsman, MD;  Location: [facility] ENDOSCOPY;  Service: Gastroenterology;  Laterality: N/A;    There were no vitals filed for this visit.   Subjective Assessment - 05/07/21 0913     Subjective Pt reports she has had some R shoulder soreness from doing her theraband exercises. She has noticed continued improvement in her dizziness, but still reports some tightness from her L cervical area to her L buttock. She reports no dizziness today.    Pertinent History Pt is a 75 yo female presenting to PT with c/o of neck pain, HA and dizziness sx that started >1 year ago. Pt previously seen in PT for neck pain, reports it improved but never fully resolved. Pt has been diagnosed with a "pinched nerve." Prolonged sitting increases her sx which also includes wooziness. Bending increases wooziness. Current pain is 6-7/10. Pt denies any light sensitivty. Pt does have hx of vertigo but reports these symptoms are different. She has difficulty lying on her L side (same side as HA) and can exerpience a "light buzz" sensation. Pt also experiences ringing in her ears sometimes. hx of chronic sinus problems. No falls in the last 6 months. Movement improves sx. Other PMH significant allergic rhinitis, anxiety, HTN, glaucoma, IBS, pure hypercholesterolemia, thoracic or lumbosacral neuritis or radiculitis, vaginal CA, OA, GERD, osteopenia, meningioma, OSA    Limitations Sitting;Lifting;House hold activities   lifting/household activities limited with  positions requiring bending   How long can you sit comfortably? Limited due to increased neck pain/HA. Reports unsure but maybe able to sit for a max of a couple hours if she has been active throughout the day.    How long can you stand comfortably? Pt repots only limited if standing in the shower    How long can you walk comfortably? Pt reports not limited    Diagnostic tests MR BRAIN 04/20/2019:    IMPRESSION:  Right para falcine meningioma is stable from 1 year ago. No mass in  the  brain.     Mild chronic microvascular ischemic changes in the white matter also  stable.  CT ANGIO HEAD 2021: "IMPRESSION:  No large vessel occlusion, high-grade narrowing, dissection or  aneurysm.     No acute intracranial finding. 2.0 cm right para falcine meningioma  is unchanged." and CT ANGIO NECK 2021: "IMPRESSION:  No large vessel occlusion, high-grade narrowing, dissection or  aneurysm.     No acute intracranial finding. 2.0 cm right para falcine meningioma  is unchanged."    Patient Stated Goals Pt would like to be able to sit as long as she'd like    Currently in Pain? No/denies    Pain Onset More than a month ago             INTERVENTIONS   Pt supine on plinth: PT provides STM and TrP release to B cervical paraspinals and posterior shoulder musculature, B upper trap. PT also provides gentle cervical traction and STM to musculature of occipital triangle.  Pt positioned with arm across chest to protract scapula to access musculature. Pt expresses most tightness in her R levator and rhomboids.   Chin tucks with manual resistance into PT's hands 10x with 3 sec holds   Upper trap stretch 2 x 30 sec each side in seated Pt educated on levator scapula stretch and performed 2 x 30 sec each side in seated  CGA provided throughout unless otherwise noted:   Seated VORx2 with horizontal head turns 3 x 30 sec. Pt requiring cues for coordinating movement, and improved with trials. Pt standing, NBOS, VORx1 with vertical head turns, busy background (5 ft from target) -  2x30 sec Ambulation with VORx1 with horizontal head turns 2 x 10 m track Ambulation with VORx1 with vertical head turns 2 x 10 m track. Pt reports some lightheadedness that subsides quickly.    Dynamic balance, Semitandem walking with narrow BOS down hallway 4x. Pt displays improved steadiness with only 1 LOB recovered by step strategy.  - Progressed to tandem walking x length of hallway. Pt displayed variable BOS and required  intermittent stepping strategy to regain balance, but overall improved with steps.  Figure 8's in and out of 4 cones to work on turns x multiple rounds. Pt reports no additional wooziness or unsteadiness when making turns. She expresses that she bumps into the walls at her house much less frequently than she had been before when making turns.   At support surface with CGA:  Tandem stance on airex 2 x 30 sec each side. Pt states she feels a bit "wobbly" and requires intermittent UE support x 2 times.  NBOS on half bosu, flat side down x 45 sec. Pt requires frequent UE support to regain balance.    PT Education - 05/07/21 1123     Education Details exercise technique, body mechanics    Person(s) Educated Patient    Methods Explanation;Demonstration;Verbal cues  Comprehension Verbalized understanding;Returned demonstration;Need further instruction              PT Short Term Goals - 03/27/21 1254       PT SHORT TERM GOAL #1   Title Pt will be independent with HEP in order to improve strength and balance in order to decrease fall risk and improve function at home and in the community    Baseline 10/27: issued seated VOR and cervical rotation stretch    Time 6    Period Weeks    Status New    Target Date 05/07/21               PT Long Term Goals - 04/02/21 1720       PT LONG TERM GOAL #1   Title Pt will decrease DHI score by at least 18 points in order to demonstrate clinically significant reduction in disability    Baseline 10/26: 22/100    Time 12    Period Weeks    Status New    Target Date 06/18/21      PT LONG TERM GOAL #2   Title Patient will increase FOTO score by at least 10 points to demonstrate improvement in mobility and quality of life.    Baseline 10/26: 65    Time 12    Period Weeks    Status New    Target Date 06/18/21      PT LONG TERM GOAL #3   Title Pt will improve DGI by at least 2 points in order to demonstrate improvement in balance and  decreased risk for falls.    Baseline 10/26: to be tested in next 1-2 sessions; 11/2: 22/24    Time 12    Period Weeks    Status Revised    Target Date 06/18/21      PT LONG TERM GOAL #4   Title Pt will report a worst neck pain of no greater than 3/10 within the past two weeks in order to improve QOL.    Baseline 10/27: pain currently 6-7/10.    Time 12    Period Weeks    Status New    Target Date 06/18/21      PT LONG TERM GOAL #5   Title Pt will report ability to bend down/over to complete yard work/ADLs without wooziness or decreased postural stability.    Baseline 10/26: Pt reports increased wooziness with bending and FOF/unsteadiness    Time 12    Period Weeks    Status New    Target Date 06/18/21                   Plan - 05/07/21 1126     Clinical Impression Statement Pt reports continued improvements in her dizziness with vertical and horizontal head turns. Pt tolerates all exercises well including progression to walking with VORx1 and VORx 2 exercises with no additional dizziness. Pt shows improvements in dynamic balance when walking in tandem and with narrow base of support. She was most challenged by balance using the half Bosu today. The pt will benefit from further skilled PT to address these deficits in order to improve balance and QOL and decrease fall risk.    Personal Factors and Comorbidities Age;Past/Current Experience;Sex;Comorbidity 3+;Time since onset of injury/illness/exacerbation    Comorbidities PMH significant for allergic rhinitis, anxiety, HTN, glaucoma, IBS, pure hypercholesterolemia, thoracic or lumbosacral neuritis or radiculitis, vaginal CA, OA, GERD, osteopenia, meningioma, OSA    Examination-Activity Limitations Bathing;Bed Mobility;Bend;Stairs;Sit;Sleep;Locomotion Level;Hygiene/Grooming  Examination-Participation Restrictions Occupation;Yard Work;Cleaning;Community Activity;Shop    Stability/Clinical Decision Making Evolving/Moderate  complexity    Rehab Potential Good    PT Frequency 2x / week    PT Duration 12 weeks    PT Treatment/Interventions ADLs/Self Care Home Management;Biofeedback;Canalith Repostioning;Cryotherapy;Electrical Stimulation;Moist Heat;Traction;Ultrasound;DME Instruction;Gait training;Stair training;Functional mobility training;Therapeutic activities;Therapeutic exercise;Balance training;Neuromuscular re-education;Patient/family education;Manual techniques;Dry needling;Passive range of motion;Energy conservation;Splinting;Taping;Vestibular;Visual/perceptual remediation/compensation;Joint Manipulations    PT Next Visit Plan orthostatics, DGI, 10MWT, gaze stabilization, balance interventions, manual therapy and therex to address cervical mobility, shoulder strengthening    PT Home Exercise Plan Access Code: [email or web address], 11/2: increase performance of seated VOR to 5 min (performed in 30 sec bouts); 11/8: Access Code: [email or web address]; 11/16: Access Code: [email or web address]; no updates    Consulted and Agree with Plan of Care Patient             Patient will benefit from skilled therapeutic intervention in order to improve the following deficits and impairments:  Pain, Dizziness, Decreased range of motion, Hypomobility, Improper body mechanics, Decreased activity tolerance, Decreased balance, Decreased mobility, Increased muscle spasms, Impaired flexibility, Impaired vision/preception, Postural dysfunction  Visit Diagnosis: Unsteadiness on feet  Dizziness and giddiness  Cervicalgia     Problem List Patient Active Problem List   Diagnosis Date Noted   Pulsatile tinnitus of right ear 06/18/2020   Pulsatile neck mass 06/18/2020   OSA (obstructive sleep apnea) 09/19/2019   Anxiety and depression 09/19/2019   Left thyroid nodule 08/08/2019   Neck pain 01/03/2019   Meningioma ([place]) 49/20/1007   Lichen sclerosus 05/20/7587   Vaginal atrophy 03/18/2016   Encounter for follow-up surveillance of vaginal cancer 03/18/2016    Eczema 10/31/2015   Left hand pain 09/10/2015   Osteopenia after menopause 07/26/2015   Menopause 07/26/2015   Irritable colon 07/26/2015   History of cancer of vagina 07/26/2015   Diffuse cystic mastopathy 07/26/2015   Obesity (BMI 30.0-34.9) 07/26/2015   Intermittent low back pain 07/26/2015   Hyperlipemia 07/26/2015   Osteoarthritis 05/13/2015   Allergic rhinitis 05/13/2015   Chronic GERD 05/13/2015   Essential hypertension, benign   Ricard Dillon PT, DPT This entire session was performed under direct supervision and direction of a licensed Chiropractor . I have personally read, edited and approve of the note as written.   Karn Cassis, SPT  05/07/2021, 11:31 AM  [place] MAIN [facility] SERVICES [address] [place], [place], [address] Phone: [phone]   Fax:  [phone]  Name: Vickie Stewart MRN: [id number] Date of Birth: [birth year]

## 2021-05-13 ENCOUNTER — Other Ambulatory Visit: Payer: Self-pay

## 2021-05-13 ENCOUNTER — Ambulatory Visit: Payer: Medicare PPO

## 2021-05-13 DIAGNOSIS — M6281 Muscle weakness (generalized): Secondary | ICD-10-CM | POA: Diagnosis not present

## 2021-05-13 DIAGNOSIS — D329 Benign neoplasm of meninges, unspecified: Secondary | ICD-10-CM | POA: Diagnosis not present

## 2021-05-13 DIAGNOSIS — R42 Dizziness and giddiness: Secondary | ICD-10-CM | POA: Diagnosis not present

## 2021-05-13 DIAGNOSIS — R2681 Unsteadiness on feet: Secondary | ICD-10-CM | POA: Diagnosis not present

## 2021-05-13 DIAGNOSIS — M542 Cervicalgia: Secondary | ICD-10-CM | POA: Diagnosis not present

## 2021-05-13 NOTE — Therapy (Addendum)
[place] [facility] SERVICES [place], [place], [address] Phone: [phone]   Fax:  [phone]  Physical Therapy Treatment  Patient Details  Name: Vickie Stewart MRN: [id number] Date of Birth: [birth year] Referring Provider (PT): Vladimir Crofts, MD   Encounter Date: 05/13/2021   PT End of Session - 05/13/21 1138     Visit Number 9    Number of Visits 25    Date for PT Re-Evaluation 06/18/21    Authorization Type eval completed 03/26/2021    PT Start Time 6203    PT Stop Time 1130    PT Time Calculation (min) 43 min    Equipment Utilized During Treatment Gait belt    Activity Tolerance Patient tolerated treatment well    Behavior During Therapy WFL for tasks assessed/performed             Past Medical History:  Diagnosis Date   Allergic rhinitis, cause unspecified    Anxiety state, unspecified    Contact dermatitis and other eczema, due to unspecified cause    Diffuse cystic mastopathy    Dyspepsia and other specified disorders of function of stomach    Essential hypertension, benign    Glaucoma    Irritable bowel syndrome    Lateral epicondylitis  of elbow    Leukocytopenia, unspecified    Mastodynia    Obesity, unspecified    Pure hypercholesterolemia    Reflux esophagitis    Thoracic or lumbosacral neuritis or radiculitis, unspecified    Unspecified disorder of skin and subcutaneous tissue    Vaginal cancer ([place])    History    Past Surgical History:  Procedure Laterality Date   BREAST EXCISIONAL BIOPSY Left    neg   BREAST LUMPECTOMY Left    COLONOSCOPY  08/26/2006   COLONOSCOPY WITH PROPOFOL N/A 10/21/2016   Procedure: COLONOSCOPY WITH PROPOFOL;  Surgeon: Christene Lye, MD;  Location: [facility] ENDOSCOPY;  Service: Endoscopy;  Laterality: N/A;   ESOPHAGOGASTRODUODENOSCOPY (EGD) WITH PROPOFOL N/A 02/07/2020   Procedure: ESOPHAGOGASTRODUODENOSCOPY (EGD) WITH PROPOFOL;  Surgeon: Lin Landsman, MD;  Location: [facility] ENDOSCOPY;  Service: Gastroenterology;  Laterality: N/A;    There were no vitals filed for this visit.   Subjective Assessment - 05/13/21 1051     Subjective Pt reports she had some pressure in her neck and soreness yesterday from prolonged sitting. She states that she had a brief episode of wooziness this morning that resolved with eating breakfast.    Pertinent History Pt is a 75 yo female presenting to PT with c/o of neck pain, HA and dizziness sx that started >1 year ago. Pt previously seen in PT for neck pain, reports it improved but never fully resolved. Pt has been diagnosed with a "pinched nerve." Prolonged sitting increases her sx which also includes wooziness. Bending increases wooziness. Current pain is 6-7/10. Pt denies any light sensitivty. Pt does have hx of vertigo but reports these symptoms are different. She has difficulty lying on her L side (same side as HA) and can exerpience a "light buzz" sensation. Pt also experiences ringing in her ears sometimes. hx of chronic sinus problems. No falls in the last 6 months. Movement improves sx. Other PMH significant allergic rhinitis, anxiety, HTN, glaucoma, IBS, pure hypercholesterolemia, thoracic or lumbosacral neuritis or radiculitis, vaginal CA, OA, GERD, osteopenia, meningioma, OSA    Limitations Sitting;Lifting;House hold activities   lifting/household activities limited with positions requiring bending   How long can you  sit comfortably? Limited due to increased neck pain/HA. Reports unsure but maybe able to sit for a max of a couple hours if she has been active throughout the day.    How long can you stand comfortably? Pt repots only limited if standing in the shower    How long can you walk comfortably? Pt reports not limited    Diagnostic tests MR BRAIN 04/20/2019:    IMPRESSION:  Right para falcine meningioma is stable from 1 year ago. No mass in  the brain.     Mild chronic microvascular ischemic changes in  the white matter also  stable.  CT ANGIO HEAD 2021: "IMPRESSION:  No large vessel occlusion, high-grade narrowing, dissection or  aneurysm.     No acute intracranial finding. 2.0 cm right para falcine meningioma  is unchanged." and CT ANGIO NECK 2021: "IMPRESSION:  No large vessel occlusion, high-grade narrowing, dissection or  aneurysm.     No acute intracranial finding. 2.0 cm right para falcine meningioma  is unchanged."    Patient Stated Goals Pt would like to be able to sit as long as she'd like    Currently in Pain? No/denies    Pain Onset More than a month ago           INTERVENTION:  Manual:  Pt supine on plinth: PT provides STM and TrP release to B cervical paraspinals and posterior shoulder musculature, B upper trap. PT also provides gentle cervical traction and STM to musculature of occipital triangle.    Upper trap stretch 2 x 30 sec each side in seated Levator scapula stretch and performed 2 x 30 sec each side in seated   STM and TrP release of cervical paraspinals and posterior shoulder musculature following session to decrease any tightness.  Neuromuscular Re-ed:  CGA provided throughout unless otherwise noted:   Seated VORx2 with horizontal head turns 2 x 30 sec. Pt displays improved coordination with movement Pt standing on airex NBOS, VORx1 with horizontal head turns, busy background (5 ft from target) -  2x30 sec. Pt reports dizziness 2/10 Pt standing on airex NBOS, VORx1 with vertical head turns, busy background (5 ft from target) -  2x30 sec. Pt reports dizziness 2/10 Ambulation with VORx1 with horizontal head turns 3 x 10 m track. Pt reports some "wooziness" rated 2/10 Ambulation with VORx1 with vertical head turns 3 x 10 m track. Pt reports some "wooziness" rated 2/10.    Dynamic balance, Semitandem walking with narrow BOS 6 x 10 meter track. Pt displays improved steadiness with only 1 LOB recovered by step strategy. Pt improved steadiness when looking up.   -  Progressed to semitandem walking with narrow BOS 4 x 10 meter track with horizontal head turns.  - Progressed to semitandem walking with narrow BOS 4 x 10 meter track with vertical head turns.    Ambulation with turns on the red mat x multiple rounds each side with close CGA. Pt displays steadiness on turns with no LOB but reports some dizzness with turning rated 3/10.      PT Education - 05/13/21 1138     Education Details exercise technique, body mechanics    Person(s) Educated Patient    Methods Explanation;Demonstration;Verbal cues    Comprehension Verbalized understanding;Returned demonstration;Verbal cues required              PT Short Term Goals - 03/27/21 1254       PT SHORT TERM GOAL #1   Title Pt will be  independent with HEP in order to improve strength and balance in order to decrease fall risk and improve function at home and in the community    Baseline 10/27: issued seated VOR and cervical rotation stretch    Time 6    Period Weeks    Status New    Target Date 05/07/21               PT Long Term Goals - 04/02/21 1720       PT LONG TERM GOAL #1   Title Pt will decrease DHI score by at least 18 points in order to demonstrate clinically significant reduction in disability    Baseline 10/26: 22/100    Time 12    Period Weeks    Status New    Target Date 06/18/21      PT LONG TERM GOAL #2   Title Patient will increase FOTO score by at least 10 points to demonstrate improvement in mobility and quality of life.    Baseline 10/26: 65    Time 12    Period Weeks    Status New    Target Date 06/18/21      PT LONG TERM GOAL #3   Title Pt will improve DGI by at least 2 points in order to demonstrate improvement in balance and decreased risk for falls.    Baseline 10/26: to be tested in next 1-2 sessions; 11/2: 22/24    Time 12    Period Weeks    Status Revised    Target Date 06/18/21      PT LONG TERM GOAL #4   Title Pt will report a worst neck pain  of no greater than 3/10 within the past two weeks in order to improve QOL.    Baseline 10/27: pain currently 6-7/10.    Time 12    Period Weeks    Status New    Target Date 06/18/21      PT LONG TERM GOAL #5   Title Pt will report ability to bend down/over to complete yard work/ADLs without wooziness or decreased postural stability.    Baseline 10/26: Pt reports increased wooziness with bending and FOF/unsteadiness    Time 12    Period Weeks    Status New    Target Date 06/18/21                   Plan - 05/13/21 1139     Clinical Impression Statement Pt presented with great motivation for all interventions today. She was able to progress dynamic balance exercise to turning with compliant surfaces. Pt displayed improved balance with all balance progressions, but reported some dizziness when turning rated 3/10. Pt continues tolerates all VOR exercises with mildly increased dizziness. The pt will benefit from further skilled PT to address these deficits in order to improve balance and QOL and decrease fall risk.    Personal Factors and Comorbidities Age;Past/Current Experience;Sex;Comorbidity 3+;Time since onset of injury/illness/exacerbation    Comorbidities PMH significant for allergic rhinitis, anxiety, HTN, glaucoma, IBS, pure hypercholesterolemia, thoracic or lumbosacral neuritis or radiculitis, vaginal CA, OA, GERD, osteopenia, meningioma, OSA    Examination-Activity Limitations Bathing;Bed Mobility;Bend;Stairs;Sit;Sleep;Locomotion Level;Hygiene/Grooming    Examination-Participation Restrictions Occupation;Yard Work;Cleaning;Community Activity;Shop    Stability/Clinical Decision Making Evolving/Moderate complexity    Rehab Potential Good    PT Frequency 2x / week    PT Duration 12 weeks    PT Treatment/Interventions ADLs/Self Care Home Management;Biofeedback;Canalith Repostioning;Cryotherapy;Electrical Stimulation;Moist Heat;Traction;Ultrasound;DME Instruction;Gait  training;Stair training;Functional mobility  training;Therapeutic activities;Therapeutic exercise;Balance training;Neuromuscular re-education;Patient/family education;Manual techniques;Dry needling;Passive range of motion;Energy conservation;Splinting;Taping;Vestibular;Visual/perceptual remediation/compensation;Joint Manipulations    PT Next Visit Plan orthostatics, DGI, 10MWT, gaze stabilization, balance interventions, manual therapy and therex to address cervical mobility, shoulder strengthening    PT Home Exercise Plan Access Code: [email or web address], 11/2: increase performance of seated VOR to 5 min (performed in 30 sec bouts); 11/8: Access Code: [email or web address]; 11/16: Access Code: [email or web address]; no updates    Consulted and Agree with Plan of Care Patient             Patient will benefit from skilled therapeutic intervention in order to improve the following deficits and impairments:  Pain, Dizziness, Decreased range of motion, Hypomobility, Improper body mechanics, Decreased activity tolerance, Decreased balance, Decreased mobility, Increased muscle spasms, Impaired flexibility, Impaired vision/preception, Postural dysfunction  Visit Diagnosis: Unsteadiness on feet  Dizziness and giddiness     Problem List Patient Active Problem List   Diagnosis Date Noted   Pulsatile tinnitus of right ear 06/18/2020   Pulsatile neck mass 06/18/2020   OSA (obstructive sleep apnea) 09/19/2019   Anxiety and depression 09/19/2019   Left thyroid nodule 08/08/2019   Neck pain 01/03/2019   Meningioma ([place]) 05/39/7673   Lichen sclerosus 41/93/7902   Vaginal atrophy 03/18/2016   Encounter for follow-up surveillance of vaginal cancer 03/18/2016   Eczema 10/31/2015   Left hand pain 09/10/2015   Osteopenia after menopause 07/26/2015   Menopause 07/26/2015   Irritable colon 07/26/2015   History of cancer of vagina 07/26/2015   Diffuse cystic mastopathy 07/26/2015   Obesity (BMI 30.0-34.9) 07/26/2015   Intermittent low  back pain 07/26/2015   Hyperlipemia 07/26/2015   Osteoarthritis 05/13/2015   Allergic rhinitis 05/13/2015   Chronic GERD 05/13/2015   Essential hypertension, benign   Ricard Dillon PT, DPT This entire session was performed under direct supervision and direction of a licensed Chiropractor . I have personally read, edited and approve of the note as written.   Karn Cassis, SPT  05/13/2021, 5:31 PM  [place] [facility] SERVICES [address] [place], [place], [address] Phone: [phone]   Fax:  [phone]  Name: Vickie Stewart MRN: [id number] Date of Birth: 28-Nov-1945

## 2021-05-15 ENCOUNTER — Ambulatory Visit: Payer: Medicare PPO

## 2021-05-15 ENCOUNTER — Other Ambulatory Visit: Payer: Self-pay

## 2021-05-15 DIAGNOSIS — R42 Dizziness and giddiness: Secondary | ICD-10-CM | POA: Diagnosis not present

## 2021-05-15 DIAGNOSIS — R2681 Unsteadiness on feet: Secondary | ICD-10-CM | POA: Diagnosis not present

## 2021-05-15 DIAGNOSIS — M542 Cervicalgia: Secondary | ICD-10-CM | POA: Diagnosis not present

## 2021-05-15 DIAGNOSIS — M6281 Muscle weakness (generalized): Secondary | ICD-10-CM | POA: Diagnosis not present

## 2021-05-15 NOTE — Therapy (Addendum)
[place] MAIN [facility] SERVICES [address] [place], [place], [address] Phone: [phone]   Fax:  [phone]  Physical Therapy Treatment/Physical Therapy Progress Note   Dates of reporting period  03/26/2021   to   05/15/2021  Patient Details  Name: Vickie Stewart MRN: [id number] Date of Birth: 04-14-1946 Referring Provider (PT): Vladimir Crofts, MD   Encounter Date: 05/15/2021   PT End of Session - 05/15/21 1048     Visit Number 10    Number of Visits 25    Date for PT Re-Evaluation 06/18/21    Authorization Type eval completed 03/26/2021    PT Start Time 0952    PT Stop Time 1035    PT Time Calculation (min) 43 min    Equipment Utilized During Treatment Gait belt    Activity Tolerance Patient tolerated treatment well    Behavior During Therapy WFL for tasks assessed/performed             Past Medical History:  Diagnosis Date   Allergic rhinitis, cause unspecified    Anxiety state, unspecified    Contact dermatitis and other eczema, due to unspecified cause    Diffuse cystic mastopathy    Dyspepsia and other specified disorders of function of stomach    Essential hypertension, benign    Glaucoma    Irritable bowel syndrome    Lateral epicondylitis  of elbow    Leukocytopenia, unspecified    Mastodynia    Obesity, unspecified    Pure hypercholesterolemia    Reflux esophagitis    Thoracic or lumbosacral neuritis or radiculitis, unspecified    Unspecified disorder of skin and subcutaneous tissue    Vaginal cancer ([place])    History    Past Surgical History:  Procedure Laterality Date   BREAST EXCISIONAL BIOPSY Left    neg   BREAST LUMPECTOMY Left    COLONOSCOPY  08/26/2006   COLONOSCOPY WITH PROPOFOL N/A 10/21/2016   Procedure: COLONOSCOPY WITH PROPOFOL;  Surgeon: Christene Lye, MD;  Location: [facility] ENDOSCOPY;  Service: Endoscopy;  Laterality: N/A;   ESOPHAGOGASTRODUODENOSCOPY (EGD) WITH PROPOFOL N/A  02/07/2020   Procedure: ESOPHAGOGASTRODUODENOSCOPY (EGD) WITH PROPOFOL;  Surgeon: Lin Landsman, MD;  Location: [facility] ENDOSCOPY;  Service: Gastroenterology;  Laterality: N/A;    There were no vitals filed for this visit.   Subjective Assessment - 05/15/21 0953     Subjective Pt reports some wooziness this morning and ate some crackers and felt it helped a little bit. Pt denies any pain, reports a 2/10 lightheadedness. Pt reports she is completing her exercises    Pertinent History Pt is a 75 yo female presenting to PT with c/o of neck pain, HA and dizziness sx that started >1 year ago. Pt previously seen in PT for neck pain, reports it improved but never fully resolved. Pt has been diagnosed with a "pinched nerve." Prolonged sitting increases her sx which also includes wooziness. Bending increases wooziness. Current pain is 6-7/10. Pt denies any light sensitivty. Pt does have hx of vertigo but reports these symptoms are different. She has difficulty lying on her L side (same side as HA) and can exerpience a "light buzz" sensation. Pt also experiences ringing in her ears sometimes. hx of chronic sinus problems. No falls in the last 6 months. Movement improves sx. Other PMH significant allergic rhinitis, anxiety, HTN, glaucoma, IBS, pure hypercholesterolemia, thoracic or lumbosacral neuritis or radiculitis, vaginal CA, OA, GERD, osteopenia, meningioma, OSA    Limitations Sitting;Lifting;House  hold activities   lifting/household activities limited with positions requiring bending   How long can you sit comfortably? Limited due to increased neck pain/HA. Reports unsure but maybe able to sit for a max of a couple hours if she has been active throughout the day.    How long can you stand comfortably? Pt repots only limited if standing in the shower    How long can you walk comfortably? Pt reports not limited    Diagnostic tests MR BRAIN 04/20/2019:    IMPRESSION:  Right para falcine meningioma is stable  from 1 year ago. No mass in  the brain.     Mild chronic microvascular ischemic changes in the white matter also  stable.  CT ANGIO HEAD 2021: "IMPRESSION:  No large vessel occlusion, high-grade narrowing, dissection or  aneurysm.     No acute intracranial finding. 2.0 cm right para falcine meningioma  is unchanged." and CT ANGIO NECK 2021: "IMPRESSION:  No large vessel occlusion, high-grade narrowing, dissection or  aneurysm.     No acute intracranial finding. 2.0 cm right para falcine meningioma  is unchanged."    Patient Stated Goals Pt would like to be able to sit as long as she'd like    Currently in Pain? No/denies    Pain Onset More than a month ago             INTERVENTION:   Goal Reassessment:   HEP: Pt reports compliance with her HEP every day other than days she has PT and occasional Sundays.  DHI: 14/100 (improvement from 22/100)  FOTO: 60%   DGI: 23/24 (improvement from 22/24)  Pt denies any neck pain today and states the highest it has reached in the last 2 weeks is a 4/10 (improvement from 6-7/10)  Neuromuscular Re-ed:   CGA provided throughout unless otherwise noted:   Pt standing on airex NBOS, VORx1 with horizontal head turns, busy background (3 ft from target) -  2x30 sec. Pt reports dizziness 2/10 Pt standing on airex NBOS, VORx1 with vertical head turns, busy background (3 ft from target) -  2x30 sec. Pt reports dizziness 2/10  Dynamic balance, Semitandem walking with narrow BOS 4x down hallway with vertical head turns.    SLS 2 x 60 sec each LE. Pt more challenged with LLE as primary stance leg. Pt requires intermittent UE support to regain balance.   Clinical Impression: Pt presented with great motivation for all goal reassessments and interventions today. Pt was able to achieve her goal of completing her HEP regiment throughout the week. She shows a mild decrease in her FOTO score, however she is showing improvements in her [place] and DGI scores, indicating  improvements in balance and dizziness. She also indicates a general decrease in neck pain and dizziness overall. The pt will benefit from further skilled PT to address these deficits in order to improve balance and QOL and decrease fall risk.    PT Education - 05/15/21 1047     Education Details goal reassessment findings, exercise technique    Person(s) Educated Patient    Methods Explanation;Demonstration;Verbal cues    Comprehension Verbalized understanding;Returned demonstration;Verbal cues required              PT Short Term Goals - 05/15/21 1049       PT SHORT TERM GOAL #1   Baseline 10/27: issued seated VOR and cervical rotation stretch; 05/15/21: Pt reports compliance with HEP everyday except for Sundays and days she has PT  Time 6    Period Weeks    Status Achieved    Target Date 05/07/21               PT Long Term Goals - 05/15/21 1050       PT LONG TERM GOAL #1   Title Pt will decrease DHI score by at least 18 points in order to demonstrate clinically significant reduction in disability    Baseline 10/26: 22/100; 05/15/21: 14/100    Time 12    Period Weeks    Status On-going    Target Date 06/18/21      PT LONG TERM GOAL #2   Title Patient will increase FOTO score by at least 10 points to demonstrate improvement in mobility and quality of life.    Baseline 10/26: 65, 05/15/21: 60%    Time 12    Period Weeks    Status On-going    Target Date 06/18/21      PT LONG TERM GOAL #3   Title Pt will improve DGI by at least 2 points in order to demonstrate improvement in balance and decreased risk for falls.    Baseline 10/26: to be tested in next 1-2 sessions; 11/2: 22/24, 05/15/21: 23/24    Time 12    Period Weeks    Status On-going    Target Date 06/18/21      PT LONG TERM GOAL #4   Title Pt will report a worst neck pain of no greater than 3/10 within the past two weeks in order to improve QOL.    Baseline 10/27: pain currently 6-7/10. 05/15/21: no  current pain, rated 4/10 highest in the last 2 weeks    Time 12    Period Weeks    Status On-going    Target Date 06/18/21      PT LONG TERM GOAL #5   Title Pt will report ability to bend down/over to complete yard work/ADLs without wooziness or decreased postural stability.    Baseline 10/26: Pt reports increased wooziness with bending and FOF/unsteadiness; 05/15/21: Pt reports improving wooziness with bending but is still present.    Time 12    Period Weeks    Status On-going    Target Date 06/18/21                   Plan - 05/15/21 1250     Clinical Impression Statement Pt presented with great motivation for all goal reassessments and interventions today. Pt was able to achieve her goal of completing her HEP regiment throughout the week. She shows a mild decrease in her FOTO score, however she is showing improvements in her [place] and DGI scores, indicating improvements in balance and dizziness. She also indicates a general decrease in neck pain and dizziness overall. The pt will benefit from further skilled PT to address these deficits in order to improve balance and QOL and decrease fall risk.    Personal Factors and Comorbidities Age;Past/Current Experience;Sex;Comorbidity 3+;Time since onset of injury/illness/exacerbation    Comorbidities PMH significant for allergic rhinitis, anxiety, HTN, glaucoma, IBS, pure hypercholesterolemia, thoracic or lumbosacral neuritis or radiculitis, vaginal CA, OA, GERD, osteopenia, meningioma, OSA    Examination-Activity Limitations Bathing;Bed Mobility;Bend;Stairs;Sit;Sleep;Locomotion Level;Hygiene/Grooming    Examination-Participation Restrictions Occupation;Yard Work;Cleaning;Community Activity;Shop    Stability/Clinical Decision Making Evolving/Moderate complexity    Rehab Potential Good    PT Frequency 2x / week    PT Duration 12 weeks    PT Treatment/Interventions ADLs/Self Care Home Management;Biofeedback;Canalith  Repostioning;Cryotherapy;Dealer  Stimulation;Moist Heat;Traction;Ultrasound;DME Instruction;Gait training;Stair training;Functional mobility training;Therapeutic activities;Therapeutic exercise;Balance training;Neuromuscular re-education;Patient/family education;Manual techniques;Dry needling;Passive range of motion;Energy conservation;Splinting;Taping;Vestibular;Visual/perceptual remediation/compensation;Joint Manipulations    PT Next Visit Plan orthostatics, DGI, 10MWT, gaze stabilization, balance interventions, manual therapy and therex to address cervical mobility, shoulder strengthening    PT Home Exercise Plan Access Code: [email or web address], 11/2: increase performance of seated VOR to 5 min (performed in 30 sec bouts); 11/8: Access Code: [email or web address]; 11/16: Access Code: [email or web address]; no updates    Consulted and Agree with Plan of Care Patient             Patient will benefit from skilled therapeutic intervention in order to improve the following deficits and impairments:  Pain, Dizziness, Decreased range of motion, Hypomobility, Improper body mechanics, Decreased activity tolerance, Decreased balance, Decreased mobility, Increased muscle spasms, Impaired flexibility, Impaired vision/preception, Postural dysfunction  Visit Diagnosis: Unsteadiness on feet  Dizziness and giddiness     Problem List Patient Active Problem List   Diagnosis Date Noted   Pulsatile tinnitus of right ear 06/18/2020   Pulsatile neck mass 06/18/2020   OSA (obstructive sleep apnea) 09/19/2019   Anxiety and depression 09/19/2019   Left thyroid nodule 08/08/2019   Neck pain 01/03/2019   Meningioma ([place]) 38/18/2993   Lichen sclerosus 71/69/6789   Vaginal atrophy 03/18/2016   Encounter for follow-up surveillance of vaginal cancer 03/18/2016   Eczema 10/31/2015   Left hand pain 09/10/2015   Osteopenia after menopause 07/26/2015   Menopause 07/26/2015   Irritable colon 07/26/2015   History of cancer of vagina 07/26/2015    Diffuse cystic mastopathy 07/26/2015   Obesity (BMI 30.0-34.9) 07/26/2015   Intermittent low back pain 07/26/2015   Hyperlipemia 07/26/2015   Osteoarthritis 05/13/2015   Allergic rhinitis 05/13/2015   Chronic GERD 05/13/2015   Essential hypertension, benign   Ricard Dillon PT, DPT This entire session was performed under direct supervision and direction of a licensed Chiropractor . I have personally read, edited and approve of the note as written.   Karn Cassis, SPT  05/15/2021, 4:45 PM  [place] MAIN [facility] SERVICES [address] [place], [place], [address] Phone: [phone]   Fax:  [phone]  Name: Christiana Gurevich MRN: [id number] Date of Birth: 04-12-1946

## 2021-05-16 DIAGNOSIS — S52501A Unspecified fracture of the lower end of right radius, initial encounter for closed fracture: Secondary | ICD-10-CM | POA: Diagnosis not present

## 2021-05-16 DIAGNOSIS — S6991XA Unspecified injury of right wrist, hand and finger(s), initial encounter: Secondary | ICD-10-CM | POA: Diagnosis not present

## 2021-05-16 DIAGNOSIS — S52614A Nondisplaced fracture of right ulna styloid process, initial encounter for closed fracture: Secondary | ICD-10-CM | POA: Diagnosis not present

## 2021-05-16 DIAGNOSIS — S52591A Other fractures of lower end of right radius, initial encounter for closed fracture: Secondary | ICD-10-CM | POA: Diagnosis not present

## 2021-05-20 ENCOUNTER — Ambulatory Visit: Payer: Medicare PPO

## 2021-05-23 DIAGNOSIS — S52501A Unspecified fracture of the lower end of right radius, initial encounter for closed fracture: Secondary | ICD-10-CM | POA: Diagnosis not present

## 2021-05-28 ENCOUNTER — Ambulatory Visit: Payer: Medicare PPO

## 2021-05-30 ENCOUNTER — Ambulatory Visit: Payer: Medicare PPO

## 2021-06-04 ENCOUNTER — Ambulatory Visit: Payer: Medicare PPO

## 2021-06-09 ENCOUNTER — Ambulatory Visit: Payer: Medicare PPO

## 2021-06-10 DIAGNOSIS — D329 Benign neoplasm of meninges, unspecified: Secondary | ICD-10-CM | POA: Diagnosis not present

## 2021-06-10 DIAGNOSIS — R2689 Other abnormalities of gait and mobility: Secondary | ICD-10-CM | POA: Diagnosis not present

## 2021-06-10 DIAGNOSIS — M5481 Occipital neuralgia: Secondary | ICD-10-CM | POA: Diagnosis not present

## 2021-06-10 DIAGNOSIS — R202 Paresthesia of skin: Secondary | ICD-10-CM | POA: Diagnosis not present

## 2021-06-11 ENCOUNTER — Ambulatory Visit: Payer: Medicare PPO

## 2021-06-13 DIAGNOSIS — S52501A Unspecified fracture of the lower end of right radius, initial encounter for closed fracture: Secondary | ICD-10-CM | POA: Diagnosis not present

## 2021-06-16 ENCOUNTER — Other Ambulatory Visit: Payer: Self-pay | Admitting: Family Medicine

## 2021-06-16 DIAGNOSIS — G8929 Other chronic pain: Secondary | ICD-10-CM

## 2021-06-18 ENCOUNTER — Other Ambulatory Visit: Payer: Self-pay

## 2021-06-18 ENCOUNTER — Ambulatory Visit: Payer: Medicare PPO | Attending: Neurology

## 2021-06-18 DIAGNOSIS — R42 Dizziness and giddiness: Secondary | ICD-10-CM | POA: Diagnosis not present

## 2021-06-18 DIAGNOSIS — M6281 Muscle weakness (generalized): Secondary | ICD-10-CM | POA: Diagnosis not present

## 2021-06-18 DIAGNOSIS — M542 Cervicalgia: Secondary | ICD-10-CM | POA: Insufficient documentation

## 2021-06-18 NOTE — Therapy (Signed)
[place] MAIN [facility] SERVICES [address] [place], [place], [address] Phone: [phone]   Fax:  [phone]  Physical Therapy Treatment  Patient Details  Name: Vickie Stewart MRN: [id number] Date of Birth: 01-08-1946 Referring Provider (PT): Vladimir Crofts, MD   Encounter Date: 06/18/2021   PT End of Session - 06/18/21 1521     Visit Number 11    Number of Visits 25    Date for PT Re-Evaluation 06/18/21    Authorization Type eval completed 03/26/2021    PT Start Time 0946    PT Stop Time 1031    PT Time Calculation (min) 45 min    Equipment Utilized During Treatment Gait belt    Activity Tolerance Patient tolerated treatment well    Behavior During Therapy WFL for tasks assessed/performed             Past Medical History:  Diagnosis Date   Allergic rhinitis, cause unspecified    Anxiety state, unspecified    Contact dermatitis and other eczema, due to unspecified cause    Diffuse cystic mastopathy    Dyspepsia and other specified disorders of function of stomach    Essential hypertension, benign    Glaucoma    Irritable bowel syndrome    Lateral epicondylitis  of elbow    Leukocytopenia, unspecified    Mastodynia    Obesity, unspecified    Pure hypercholesterolemia    Reflux esophagitis    Thoracic or lumbosacral neuritis or radiculitis, unspecified    Unspecified disorder of skin and subcutaneous tissue    Vaginal cancer ([place])    History    Past Surgical History:  Procedure Laterality Date   BREAST EXCISIONAL BIOPSY Left    neg   BREAST LUMPECTOMY Left    COLONOSCOPY  08/26/2006   COLONOSCOPY WITH PROPOFOL N/A 10/21/2016   Procedure: COLONOSCOPY WITH PROPOFOL;  Surgeon: Christene Lye, MD;  Location: [facility] ENDOSCOPY;  Service: Endoscopy;  Laterality: N/A;   ESOPHAGOGASTRODUODENOSCOPY (EGD) WITH PROPOFOL N/A 02/07/2020   Procedure: ESOPHAGOGASTRODUODENOSCOPY (EGD) WITH PROPOFOL;  Surgeon: Lin Landsman, MD;  Location: [facility] ENDOSCOPY;  Service: Gastroenterology;  Laterality: N/A;    There were no vitals filed for this visit.   Subjective Assessment - 06/18/21 0944     Subjective Pt returns to PT after long absence due to fall on 05/16/21 where pt fractured R wrist.  Pt reports she broke wrist due to falling off a step stool when putting away a quilt. She reports she was not dizzy when she fell, but that the heaviness of the quilt threw her off balance. Pt reports that PT had been helping her dizziness symptoms, but that they have increased since she has missed several appointments. She reports her symptoms are still better than what they were at the start of PT. Pt reports she has been performing her VOR exercises.    Pertinent History Pt is a 76 yo female presenting to PT with c/o of neck pain, HA and dizziness sx that started >1 year ago. Pt previously seen in PT for neck pain, reports it improved but never fully resolved. Pt has been diagnosed with a "pinched nerve." Prolonged sitting increases her sx which also includes wooziness. Bending increases wooziness. Current pain is 6-7/10. Pt denies any light sensitivty. Pt does have hx of vertigo but reports these symptoms are different. She has difficulty lying on her L side (same side as HA) and can exerpience a "light buzz" sensation.  Pt also experiences ringing in her ears sometimes. hx of chronic sinus problems. No falls in the last 6 months. Movement improves sx. Other PMH significant allergic rhinitis, anxiety, HTN, glaucoma, IBS, pure hypercholesterolemia, thoracic or lumbosacral neuritis or radiculitis, vaginal CA, OA, GERD, osteopenia, meningioma, OSA    Limitations Sitting;Lifting;House hold activities   lifting/household activities limited with positions requiring bending   How long can you sit comfortably? Limited due to increased neck pain/HA. Reports unsure but maybe able to sit for a max of a couple hours if she has been active  throughout the day.    How long can you stand comfortably? Pt repots only limited if standing in the shower    How long can you walk comfortably? Pt reports not limited    Diagnostic tests MR BRAIN 04/20/2019:    IMPRESSION:  Right para falcine meningioma is stable from 1 year ago. No mass in  the brain.     Mild chronic microvascular ischemic changes in the white matter also  stable.  CT ANGIO HEAD 2021: "IMPRESSION:  No large vessel occlusion, high-grade narrowing, dissection or  aneurysm.     No acute intracranial finding. 2.0 cm right para falcine meningioma  is unchanged." and CT ANGIO NECK 2021: "IMPRESSION:  No large vessel occlusion, high-grade narrowing, dissection or  aneurysm.     No acute intracranial finding. 2.0 cm right para falcine meningioma  is unchanged."    Patient Stated Goals Pt would like to be able to sit as long as she'd like    Currently in Pain? Yes    Pain Location --   chronic discomfort/pressure felt around head/neck   Pain Onset More than a month ago             INTERVENTION:   Manual:   Pt supine on plinth: PT provides STM and TrP release to B cervical paraspinals and posterior shoulder musculature, B upper trap. PT also provides gentle cervical traction and STM to musculature of occipital triangle. Pt continues to have tenderness felt near occipital triangle and throughout upper cervical paraspinals. Additionally, while providing STM, TrP release and gentle traction, PT brings pt through the following: Supine Upper trap stretch sustained each side x several minutes Supine cervical rotation stretch sustained each side x several minutes Cervical extensors stretch 2x30 sec  TherEx: Seated scapular squeezes 20x  Seated Ys 12x B. Cuing for technique/to perform in pain-free range, discontinued due to reports of wrist pain. Pt reports no wrist pain following discontinuation of intervention.    Neuromuscular Re-ed:   CGA provided throughout unless otherwise  noted:   Seated VORx2 with horizontal head turns 2 x 30 sec. PT provides VC/TC/ demo for technique. Pt with some difficulty with coordination. Reports no increase in dizziness. Seated VORx2 with vertical head turns 2 x 30 sec.  PT provides VC/TC and demo for technique. Pt reports no increase in dizziness. Pt continues to have difficulty with technique.  Standing VORx2 with horizontal head turns - 2x30 sec pt reported initial ligthheaded feeing while performing intervention, but reports no sx with second set.  Standing VORx2 1x30 sec with vertical head turns.   Dynamic balance: semi-tandem walking 1x over 10 meter track. Increased variability in BOS but no LOB. Pt reports very slight symptoms.  Ambulation with VORx1, busy background, horizontal head turns, forward and backward 2x for each. Ambulation VORx1, busy background, vertical head turns, forward and backward 2x for each.    PT reviewed HEP with pt: Access  Code: [email or web address] URL: [email or web address] Date: 06/18/2021 Prepared by: Ricard Dillon  Exercises Seated Gaze Stabilization with Head Rotation and Horizontal Arm Movement - 1 x daily - 7 x weekly - 6 sets - 1 reps - 60 hold   Pt educated throughout session about proper posture and technique with exercises. Improved exercise technique, movement at target joints, use of target muscles after min to mod verbal, visual, tactile cues.   PT Education - 06/18/21 1521     Education Details exercise technique, body mechanics    Person(s) Educated Patient    Methods Explanation;Demonstration;Tactile cues;Verbal cues;Handout    Comprehension Verbalized understanding;Verbal cues required;Returned demonstration;Tactile cues required;Need further instruction              PT Short Term Goals - 05/15/21 1049       PT SHORT TERM GOAL #1   Title Pt will be independent with HEP in order to improve strength and balance in order to decrease fall risk and improve function at  home and in the community    Baseline 10/27: issued seated VOR and cervical rotation stretch; 05/15/21: Pt reports compliance with HEP everyday except for Sundays and days she has PT    Time 6    Period Weeks    Status Achieved    Target Date 05/07/21               PT Long Term Goals - 05/15/21 1050       PT LONG TERM GOAL #1   Title Pt will decrease DHI score by at least 18 points in order to demonstrate clinically significant reduction in disability    Baseline 10/26: 22/100; 05/15/21: 14/100    Time 12    Period Weeks    Status On-going    Target Date 06/18/21      PT LONG TERM GOAL #2   Title Patient will increase FOTO score by at least 10 points to demonstrate improvement in mobility and quality of life.    Baseline 10/26: 65, 05/15/21: 60%    Time 12    Period Weeks    Status On-going    Target Date 06/18/21      PT LONG TERM GOAL #3   Title Pt will improve DGI by at least 2 points in order to demonstrate improvement in balance and decreased risk for falls.    Baseline 10/26: to be tested in next 1-2 sessions; 11/2: 22/24, 05/15/21: 23/24    Time 12    Period Weeks    Status On-going    Target Date 06/18/21      PT LONG TERM GOAL #4   Title Pt will report a worst neck pain of no greater than 3/10 within the past two weeks in order to improve QOL.    Baseline 10/27: pain currently 6-7/10. 05/15/21: no current pain, rated 4/10 highest in the last 2 weeks    Time 12    Period Weeks    Status On-going    Target Date 06/18/21      PT LONG TERM GOAL #5   Title Pt will report ability to bend down/over to complete yard work/ADLs without wooziness or decreased postural stability.    Baseline 10/26: Pt reports increased wooziness with bending and FOF/unsteadiness; 05/15/21: Pt reports improving wooziness with bending but is still present.    Time 12    Period Weeks    Status On-going    Target Date 06/18/21  Plan - 06/18/21 1912      Clinical Impression Statement Pt returns to PT after having broken her R wrist at her home (see subjective for details). Pt responded well to manual therapy and was found to be TTP in musculature near occiput and upper cervical paraspinals. Pt responded best to gentle traction reporting it felt good to region. Pt reported no dizziness or sx with majority of VOR interventions. Pt only reported brief lightheaded sesnsation with first set of standing VORx2 with horizontal head turns. She reported no symptoms with second set. The pt will benefit from further skilled PT to continue to address these symptoms/impairments in order to improve pain, balance, dizziness symptoms and QOL.    Personal Factors and Comorbidities Age;Past/Current Experience;Sex;Comorbidity 3+;Time since onset of injury/illness/exacerbation    Comorbidities PMH significant for allergic rhinitis, anxiety, HTN, glaucoma, IBS, pure hypercholesterolemia, thoracic or lumbosacral neuritis or radiculitis, vaginal CA, OA, GERD, osteopenia, meningioma, OSA    Examination-Activity Limitations Bathing;Bed Mobility;Bend;Stairs;Sit;Sleep;Locomotion Level;Hygiene/Grooming    Examination-Participation Restrictions Occupation;Yard Work;Cleaning;Community Activity;Shop    Stability/Clinical Decision Making Evolving/Moderate complexity    Rehab Potential Good    PT Frequency 2x / week    PT Duration 12 weeks    PT Treatment/Interventions ADLs/Self Care Home Management;Biofeedback;Canalith Repostioning;Cryotherapy;Electrical Stimulation;Moist Heat;Traction;Ultrasound;DME Instruction;Gait training;Stair training;Functional mobility training;Therapeutic activities;Therapeutic exercise;Balance training;Neuromuscular re-education;Patient/family education;Manual techniques;Dry needling;Passive range of motion;Energy conservation;Splinting;Taping;Vestibular;Visual/perceptual remediation/compensation;Joint Manipulations    PT Next Visit Plan orthostatics, DGI,  10MWT, gaze stabilization, balance interventions, manual therapy and therex to address cervical mobility, shoulder strengthening    PT Home Exercise Plan Access Code: [email or web address], 11/2: increase performance of seated VOR to 5 min (performed in 30 sec bouts); 11/8: Access Code: [email or web address]; 11/16: Access Code: [email or web address]; no updates; 1/18: ?Access Code: [email or web address]    Consulted and Agree with Plan of Care Patient             Patient will benefit from skilled therapeutic intervention in order to improve the following deficits and impairments:  Pain, Dizziness, Decreased range of motion, Hypomobility, Improper body mechanics, Decreased activity tolerance, Decreased balance, Decreased mobility, Increased muscle spasms, Impaired flexibility, Impaired vision/preception, Postural dysfunction  Visit Diagnosis: Dizziness and giddiness  Cervicalgia     Problem List Patient Active Problem List   Diagnosis Date Noted   Pulsatile tinnitus of right ear 06/18/2020   Pulsatile neck mass 06/18/2020   OSA (obstructive sleep apnea) 09/19/2019   Anxiety and depression 09/19/2019   Left thyroid nodule 08/08/2019   Neck pain 01/03/2019   Meningioma ([place]) 29/92/4268   Lichen sclerosus 34/19/6222   Vaginal atrophy 03/18/2016   Encounter for follow-up surveillance of vaginal cancer 03/18/2016   Eczema 10/31/2015   Left hand pain 09/10/2015   Osteopenia after menopause 07/26/2015   Menopause 07/26/2015   Irritable colon 07/26/2015   History of cancer of vagina 07/26/2015   Diffuse cystic mastopathy 07/26/2015   Obesity (BMI 30.0-34.9) 07/26/2015   Intermittent low back pain 07/26/2015   Hyperlipemia 07/26/2015   Osteoarthritis 05/13/2015   Allergic rhinitis 05/13/2015   Chronic GERD 05/13/2015   Essential hypertension, benign     Zollie Pee, PT 06/18/2021, 7:18 PM  [place] MAIN [facility] SERVICES [place], [place], [address] Phone: [phone]    Fax:  [phone]  Name: Vickie Stewart MRN: [id number] Date of Birth: 11-07-1945

## 2021-06-24 ENCOUNTER — Other Ambulatory Visit: Payer: Self-pay

## 2021-06-24 ENCOUNTER — Ambulatory Visit: Payer: Medicare PPO

## 2021-06-24 DIAGNOSIS — R42 Dizziness and giddiness: Secondary | ICD-10-CM | POA: Diagnosis not present

## 2021-06-24 DIAGNOSIS — M542 Cervicalgia: Secondary | ICD-10-CM

## 2021-06-24 DIAGNOSIS — M6281 Muscle weakness (generalized): Secondary | ICD-10-CM | POA: Diagnosis not present

## 2021-06-24 NOTE — Therapy (Signed)
[place] MAIN [facility] SERVICES [address] [place], [place], [address] Phone: [phone]   Fax:  [phone]  Physical Therapy Treatment/RECERT  Patient Details  Name: Vickie Stewart MRN: [id number] Date of Birth: 1945/07/08 Referring Provider (PT): Vladimir Crofts, MD   Encounter Date: 06/24/2021   PT End of Session - 06/24/21 1312     Visit Number 12    Number of Visits 25    Date for PT Re-Evaluation 06/18/21    Authorization Type eval completed 03/26/2021    PT Start Time 1151    PT Stop Time 1234    PT Time Calculation (min) 43 min    Equipment Utilized During Treatment Gait belt    Activity Tolerance Patient tolerated treatment well;No increased pain    Behavior During Therapy WFL for tasks assessed/performed             Past Medical History:  Diagnosis Date   Allergic rhinitis, cause unspecified    Anxiety state, unspecified    Contact dermatitis and other eczema, due to unspecified cause    Diffuse cystic mastopathy    Dyspepsia and other specified disorders of function of stomach    Essential hypertension, benign    Glaucoma    Irritable bowel syndrome    Lateral epicondylitis  of elbow    Leukocytopenia, unspecified    Mastodynia    Obesity, unspecified    Pure hypercholesterolemia    Reflux esophagitis    Thoracic or lumbosacral neuritis or radiculitis, unspecified    Unspecified disorder of skin and subcutaneous tissue    Vaginal cancer ([place])    History    Past Surgical History:  Procedure Laterality Date   BREAST EXCISIONAL BIOPSY Left    neg   BREAST LUMPECTOMY Left    COLONOSCOPY  08/26/2006   COLONOSCOPY WITH PROPOFOL N/A 10/21/2016   Procedure: COLONOSCOPY WITH PROPOFOL;  Surgeon: Christene Lye, MD;  Location: [facility] ENDOSCOPY;  Service: Endoscopy;  Laterality: N/A;   ESOPHAGOGASTRODUODENOSCOPY (EGD) WITH PROPOFOL N/A 02/07/2020   Procedure: ESOPHAGOGASTRODUODENOSCOPY (EGD) WITH  PROPOFOL;  Surgeon: Lin Landsman, MD;  Location: [facility] ENDOSCOPY;  Service: Gastroenterology;  Laterality: N/A;    There were no vitals filed for this visit.   Subjective Assessment - 06/24/21 1150     Subjective Pt reports L side neck pain briefly reached 10/10 over past week. She reports massage helped decrease the pain. She reports only soreness currently. The pt reports she thinks she may have overdone it with her exercises.    Pertinent History Pt is a 76 yo female presenting to PT with c/o of neck pain, HA and dizziness sx that started >1 year ago. Pt previously seen in PT for neck pain, reports it improved but never fully resolved. Pt has been diagnosed with a "pinched nerve." Prolonged sitting increases her sx which also includes wooziness. Bending increases wooziness. Current pain is 6-7/10. Pt denies any light sensitivty. Pt does have hx of vertigo but reports these symptoms are different. She has difficulty lying on her L side (same side as HA) and can exerpience a "light buzz" sensation. Pt also experiences ringing in her ears sometimes. hx of chronic sinus problems. No falls in the last 6 months. Movement improves sx. Other PMH significant allergic rhinitis, anxiety, HTN, glaucoma, IBS, pure hypercholesterolemia, thoracic or lumbosacral neuritis or radiculitis, vaginal CA, OA, GERD, osteopenia, meningioma, OSA    Limitations Sitting;Lifting;House hold activities   lifting/household activities limited with positions requiring  bending   How long can you sit comfortably? Limited due to increased neck pain/HA. Reports unsure but maybe able to sit for a max of a couple hours if she has been active throughout the day.    How long can you stand comfortably? Pt repots only limited if standing in the shower    How long can you walk comfortably? Pt reports not limited    Diagnostic tests MR BRAIN 04/20/2019:    IMPRESSION:  Right para falcine meningioma is stable from 1 year ago. No mass in   the brain.     Mild chronic microvascular ischemic changes in the white matter also  stable.  CT ANGIO HEAD 2021: "IMPRESSION:  No large vessel occlusion, high-grade narrowing, dissection or  aneurysm.     No acute intracranial finding. 2.0 cm right para falcine meningioma  is unchanged." and CT ANGIO NECK 2021: "IMPRESSION:  No large vessel occlusion, high-grade narrowing, dissection or  aneurysm.     No acute intracranial finding. 2.0 cm right para falcine meningioma  is unchanged."    Patient Stated Goals Pt would like to be able to sit as long as she'd like    Currently in Pain? Yes    Pain Location Neck    Pain Orientation Left    Pain Onset More than a month ago              INTERVENTION:   Manual:  PT provides STM and TrP release throughout B cervical paraspinals, occipital triangle, and B upper traps. PT also provides gentle cervical traction. Pt continues to have tenderness felt near occipital triangle and throughout upper cervical paraspinals, she reports greatest tenderness on R side with manual. Additionally, while providing STM, TrP release and gentle traction, PT brings pt through the following: Supine Upper trap stretch sustained each side x several minutes Supine cervical rotation stretch sustained each side x several minutes Supine cervical rotation with flexion stretch x 2 min each side Cervical extensors stretch x 60 sec  Supine chin tucks 8x with 3 sec holds, cuing to perform within pain-free intensity.   Seated STM and TrP release to B UTs, rhomboids, and posterior shoulder musculature x several minutes. Pt seated, PT provides CPAs grade I-II C5-7. Pt reports greatest tenderness approx C7.    Instruction in HEP modification:  Reduce frequency of VOR exercises (just 5 minutes, spread out over day). Perform all interventions in pain-free range and discontinue if you experience neck pain increase. Focus on walking more this week, and increase time walked by 5 minutes.  Pt verbalized understanding.  DHI: 10/100     Pt educated throughout session about proper posture and technique with exercises. Improved exercise technique, movement at target joints, use of target muscles after min to mod verbal, visual, tactile cues.  Pt responds well to interventions. She reports manual therapy feels goods and reports no pain increase at end of session.    PT Education - 06/24/21 1311     Education Details modification of HEP: reduce VOR intervention to 5 min and stop if pt experiences increased neck pain, focus on performing HEP in pain-free range, focus on increasing time ambulated by 5 min/day    Person(s) Educated Patient    Methods Explanation    Comprehension Verbalized understanding              PT Short Term Goals - 06/24/21 1309       PT SHORT TERM GOAL #1   Title Pt will  be independent with HEP in order to improve strength and balance in order to decrease fall risk and improve function at home and in the community    Baseline 10/27: issued seated VOR and cervical rotation stretch; 05/15/21: Pt reports compliance with HEP everyday except for Sundays and days she has PT    Time 6    Period Weeks    Status Achieved    Target Date 05/07/21               PT Long Term Goals - 06/24/21 1227       PT LONG TERM GOAL #1   Title Pt will decrease DHI score by at least 18 points in order to demonstrate clinically significant reduction in disability    Baseline 10/26: 22/100; 05/15/21: 14/100; 1/14: 10/100    Time 12    Period Weeks    Status On-going    Target Date 09/16/21      PT LONG TERM GOAL #2   Title Patient will increase FOTO score by at least 10 points to demonstrate improvement in mobility and quality of life.    Baseline 10/26: 65, 05/15/21: 60%; 1/24: deferred to next session, unable to assess due to time    Time 12    Period Weeks    Status On-going    Target Date 09/16/21      PT LONG TERM GOAL #3   Title Pt will improve DGI  by at least 2 points in order to demonstrate improvement in balance and decreased risk for falls.    Baseline 10/26: to be tested in next 1-2 sessions; 11/2: 22/24, 05/15/21: 23/24; 1/24: deferred to next visit    Time 12    Period Weeks    Status On-going    Target Date 09/16/21      PT LONG TERM GOAL #4   Title Pt will report a worst neck pain of no greater than 3/10 within the past two weeks in order to improve QOL.    Baseline 10/27: pain currently 6-7/10. 05/15/21: no current pain, rated 4/10 highest in the last 2 weeks; 1/24: pt reports pain briefly achieved 10/10 over the past week, but has been averaging 7/10.    Time 12    Period Weeks    Status On-going    Target Date 09/16/21      PT LONG TERM GOAL #5   Title Pt will report ability to bend down/over to complete yard work/ADLs without wooziness or decreased postural stability.    Baseline 10/26: Pt reports increased wooziness with bending and FOF/unsteadiness; 05/15/21: Pt reports improving wooziness with bending but is still present.; 1/24: pt reports increased wooziness with bending to 5/10    Time 12    Period Weeks    Status On-going    Target Date 09/16/21                   Plan - 06/24/21 1313     Clinical Impression Statement Goal reassessment initiated for recert. Pt making gains with improvement in Warren score to 10/100, indicating low perception of handicap due to dizziness. While pt making gains, she did report increased neck pain within last week to 10/10 (brief, 1x) and reported average neck pain of 7/10. The pt reported she is still experiencing wooziness with bending. Remainder of session was focused on manual therapy to address increased neck pain where pt reported positive response to STM and TrP release. Other goals to be addressed next session.  Patient's condition has the potential to improve in response to therapy. Maximum improvement is yet to be obtained. The anticipated improvement is attainable and  reasonable in a generally predictable time.  The pt will benefit from further skilled PT to improve pain, balance and dizziness symptoms.    Personal Factors and Comorbidities Age;Past/Current Experience;Sex;Comorbidity 3+;Time since onset of injury/illness/exacerbation    Comorbidities PMH significant for allergic rhinitis, anxiety, HTN, glaucoma, IBS, pure hypercholesterolemia, thoracic or lumbosacral neuritis or radiculitis, vaginal CA, OA, GERD, osteopenia, meningioma, OSA    Examination-Activity Limitations Bathing;Bed Mobility;Bend;Stairs;Sit;Sleep;Locomotion Level;Hygiene/Grooming    Examination-Participation Restrictions Occupation;Yard Work;Cleaning;Community Activity;Shop    Stability/Clinical Decision Making Evolving/Moderate complexity    Rehab Potential Good    PT Frequency 2x / week    PT Duration 12 weeks    PT Treatment/Interventions ADLs/Self Care Home Management;Biofeedback;Canalith Repostioning;Cryotherapy;Electrical Stimulation;Moist Heat;Traction;Ultrasound;DME Instruction;Gait training;Stair training;Functional mobility training;Therapeutic activities;Therapeutic exercise;Balance training;Neuromuscular re-education;Patient/family education;Manual techniques;Dry needling;Passive range of motion;Energy conservation;Splinting;Taping;Vestibular;Visual/perceptual remediation/compensation;Joint Manipulations    PT Next Visit Plan orthostatics, DGI, 10MWT, gaze stabilization, balance interventions, manual therapy and therex to address cervical mobility, shoulder strengthening    PT Home Exercise Plan Access Code: [email or web address], 11/2: increase performance of seated VOR to 5 min (performed in 30 sec bouts); 11/8: Access Code: [email or web address]; 11/16: Access Code: [email or web address]; no updates; 1/18: ?Access Code: [email or web address]; 1/24: educated pt to reduce VOR interventions to 5 min for pain modulation of neck and to discontinue should she experience increased neck pain, education on performing all HEP within pain-free  range and increasing time ambulated by 5 min.    Consulted and Agree with Plan of Care Patient             Patient will benefit from skilled therapeutic intervention in order to improve the following deficits and impairments:  Pain, Dizziness, Decreased range of motion, Hypomobility, Improper body mechanics, Decreased activity tolerance, Decreased balance, Decreased mobility, Increased muscle spasms, Impaired flexibility, Impaired vision/preception, Postural dysfunction  Visit Diagnosis: Cervicalgia  Muscle weakness (generalized)  Dizziness and giddiness     Problem List Patient Active Problem List   Diagnosis Date Noted   Pulsatile tinnitus of right ear 06/18/2020   Pulsatile neck mass 06/18/2020   OSA (obstructive sleep apnea) 09/19/2019   Anxiety and depression 09/19/2019   Left thyroid nodule 08/08/2019   Neck pain 01/03/2019   Meningioma ([place]) 01/00/7121   Lichen sclerosus 97/58/8325   Vaginal atrophy 03/18/2016   Encounter for follow-up surveillance of vaginal cancer 03/18/2016   Eczema 10/31/2015   Left hand pain 09/10/2015   Osteopenia after menopause 07/26/2015   Menopause 07/26/2015   Irritable colon 07/26/2015   History of cancer of vagina 07/26/2015   Diffuse cystic mastopathy 07/26/2015   Obesity (BMI 30.0-34.9) 07/26/2015   Intermittent low back pain 07/26/2015   Hyperlipemia 07/26/2015   Osteoarthritis 05/13/2015   Allergic rhinitis 05/13/2015   Chronic GERD 05/13/2015   Essential hypertension, benign     Zollie Pee, PT 06/24/2021, 1:27 PM  [place] MAIN [facility] SERVICES [address] [place], [place], [address] Phone: [phone]   Fax:  [phone]  Name: Vickie Stewart MRN: [id number] Date of Birth: [birth year]

## 2021-06-26 ENCOUNTER — Ambulatory Visit: Payer: Medicare PPO

## 2021-06-26 DIAGNOSIS — S52501D Unspecified fracture of the lower end of right radius, subsequent encounter for closed fracture with routine healing: Secondary | ICD-10-CM | POA: Diagnosis not present

## 2021-06-27 ENCOUNTER — Ambulatory Visit: Payer: Medicare PPO

## 2021-07-01 DIAGNOSIS — H40153 Residual stage of open-angle glaucoma, bilateral: Secondary | ICD-10-CM | POA: Diagnosis not present

## 2021-07-02 ENCOUNTER — Other Ambulatory Visit: Payer: Self-pay

## 2021-07-02 ENCOUNTER — Ambulatory Visit: Payer: Medicare PPO | Attending: Neurology

## 2021-07-02 DIAGNOSIS — R2681 Unsteadiness on feet: Secondary | ICD-10-CM | POA: Insufficient documentation

## 2021-07-02 DIAGNOSIS — R42 Dizziness and giddiness: Secondary | ICD-10-CM | POA: Insufficient documentation

## 2021-07-02 DIAGNOSIS — M542 Cervicalgia: Secondary | ICD-10-CM | POA: Diagnosis not present

## 2021-07-02 DIAGNOSIS — M6281 Muscle weakness (generalized): Secondary | ICD-10-CM | POA: Insufficient documentation

## 2021-07-02 NOTE — Therapy (Signed)
[place] MAIN [facility] SERVICES [address] [place], [place], [address] Phone: [phone]   Fax:  [phone]  Physical Therapy Treatment  Patient Details  Name: Vickie Stewart MRN: [id number] Date of Birth: [birth year] Referring Provider (PT): Vladimir Crofts, MD   Encounter Date: 07/02/2021   PT End of Session - 07/03/21 0837     Visit Number 13    Number of Visits 25    Date for PT Re-Evaluation 06/18/21    Authorization Type eval completed 03/26/2021    PT Start Time 1146    PT Stop Time 1228    PT Time Calculation (min) 42 min    Equipment Utilized During Treatment Gait belt    Activity Tolerance Patient tolerated treatment well;No increased pain    Behavior During Therapy WFL for tasks assessed/performed             Past Medical History:  Diagnosis Date   Allergic rhinitis, cause unspecified    Anxiety state, unspecified    Contact dermatitis and other eczema, due to unspecified cause    Diffuse cystic mastopathy    Dyspepsia and other specified disorders of function of stomach    Essential hypertension, benign    Glaucoma    Irritable bowel syndrome    Lateral epicondylitis  of elbow    Leukocytopenia, unspecified    Mastodynia    Obesity, unspecified    Pure hypercholesterolemia    Reflux esophagitis    Thoracic or lumbosacral neuritis or radiculitis, unspecified    Unspecified disorder of skin and subcutaneous tissue    Vaginal cancer ([place])    History    Past Surgical History:  Procedure Laterality Date   BREAST EXCISIONAL BIOPSY Left    neg   BREAST LUMPECTOMY Left    COLONOSCOPY  08/26/2006   COLONOSCOPY WITH PROPOFOL N/A 10/21/2016   Procedure: COLONOSCOPY WITH PROPOFOL;  Surgeon: Christene Lye, MD;  Location: [facility] ENDOSCOPY;  Service: Endoscopy;  Laterality: N/A;   ESOPHAGOGASTRODUODENOSCOPY (EGD) WITH PROPOFOL N/A 02/07/2020   Procedure: ESOPHAGOGASTRODUODENOSCOPY (EGD) WITH PROPOFOL;   Surgeon: Lin Landsman, MD;  Location: [facility] ENDOSCOPY;  Service: Gastroenterology;  Laterality: N/A;    There were no vitals filed for this visit.   Subjective Assessment - 07/02/21 1147     Subjective Pt reports current soreness felt from her neck down to her back. Pt reports muscle spasm felt in her neck on Saturday. She thinks she maybe over-massaged her neck contributing to its soreness. She reports no current pain. Pt reports she was dizzy yesterday and that dizziness reached 4/10. She reports she has felt more lightheaded than dizzy.    Pertinent History Pt is a 76 yo female presenting to PT with c/o of neck pain, HA and dizziness sx that started >1 year ago. Pt previously seen in PT for neck pain, reports it improved but never fully resolved. Pt has been diagnosed with a "pinched nerve." Prolonged sitting increases her sx which also includes wooziness. Bending increases wooziness. Current pain is 6-7/10. Pt denies any light sensitivty. Pt does have hx of vertigo but reports these symptoms are different. She has difficulty lying on her L side (same side as HA) and can exerpience a "light buzz" sensation. Pt also experiences ringing in her ears sometimes. hx of chronic sinus problems. No falls in the last 6 months. Movement improves sx. Other PMH significant allergic rhinitis, anxiety, HTN, glaucoma, IBS, pure hypercholesterolemia, thoracic or lumbosacral neuritis or radiculitis, vaginal  CA, OA, GERD, osteopenia, meningioma, OSA    Limitations Sitting;Lifting;House hold activities   lifting/household activities limited with positions requiring bending   How long can you sit comfortably? Limited due to increased neck pain/HA. Reports unsure but maybe able to sit for a max of a couple hours if she has been active throughout the day.    How long can you stand comfortably? Pt repots only limited if standing in the shower    How long can you walk comfortably? Pt reports not limited    Diagnostic  tests MR BRAIN 04/20/2019:    IMPRESSION:  Right para falcine meningioma is stable from 1 year ago. No mass in  the brain.     Mild chronic microvascular ischemic changes in the white matter also  stable.  CT ANGIO HEAD 2021: "IMPRESSION:  No large vessel occlusion, high-grade narrowing, dissection or  aneurysm.     No acute intracranial finding. 2.0 cm right para falcine meningioma  is unchanged." and CT ANGIO NECK 2021: "IMPRESSION:  No large vessel occlusion, high-grade narrowing, dissection or  aneurysm.     No acute intracranial finding. 2.0 cm right para falcine meningioma  is unchanged."    Patient Stated Goals Pt would like to be able to sit as long as she'd like    Currently in Pain? No/denies    Pain Onset More than a month ago             INTERVENTION:   Manual:  Pt supine on plinth: PT provides continued STM and TrP release this session to B cervical paraspinals, occipital triangle, and B upper traps. PT additionally provides gentle cervical traction with STM and TrP release. Pt reports tenderness primarily on R side cervical paraspinals and tenderness near occipital triangle. Additionally, while providing STM, TrP release and gentle traction, PT brings pt through the following: Supine Upper trap stretch sustained each side x several minutes Supine cervical rotation stretch sustained each side x several minutes Cervical extensors stretch x 60 sec   Supine chin tucks 10x with 1 sec holds, continued cuing to perform within pain-free intensity and for technique. Pt reports feeling tightness down her back with intervention.  Pt prone on plinth: PT assesses cervical and thoracic spinal mobility and pain. Pt predominantly tender throughout C6-T7, with greatest tenderness in cervical spine.  PT provides CPAs, grade I-II throughout C6-T7 in 20-30 sec bouts. While pt reports tenderness, she reports UPAs feel good and improve sx.   Prone Is 2x10 Prone Ts 2x10 Prone Ys 2x10  Seated  thoracic extension over chair 10x.   Seated chin tuck with manual resistance 10x; cuing for technique   Additional STM and TrP release to B UTs, and B posterior shoulder musculature x 3 min with pt in seated following strengthening. Pt reports STM feels good, particulary on R side.   Pt educated throughout session about proper posture and technique with exercises. Improved exercise technique, movement at target joints, use of target muscles after min to mod verbal, visual, tactile cues.     PT Education - 07/03/21 3545     Education Details Review of HEP, performing HEP within pain-free and symptom limited ranges.    Person(s) Educated Patient    Methods Explanation    Comprehension Verbalized understanding              PT Short Term Goals - 06/24/21 1309       PT SHORT TERM GOAL #1   Title Pt will be independent  with HEP in order to improve strength and balance in order to decrease fall risk and improve function at home and in the community    Baseline 10/27: issued seated VOR and cervical rotation stretch; 05/15/21: Pt reports compliance with HEP everyday except for Sundays and days she has PT    Time 6    Period Weeks    Status Achieved    Target Date 05/07/21               PT Long Term Goals - 06/24/21 1227       PT LONG TERM GOAL #1   Title Pt will decrease DHI score by at least 18 points in order to demonstrate clinically significant reduction in disability    Baseline 10/26: 22/100; 05/15/21: 14/100; 1/14: 10/100    Time 12    Period Weeks    Status On-going    Target Date 09/16/21      PT LONG TERM GOAL #2   Title Patient will increase FOTO score by at least 10 points to demonstrate improvement in mobility and quality of life.    Baseline 10/26: 65, 05/15/21: 60%; 1/24: deferred to next session, unable to assess due to time    Time 12    Period Weeks    Status On-going    Target Date 09/16/21      PT LONG TERM GOAL #3   Title Pt will improve DGI by  at least 2 points in order to demonstrate improvement in balance and decreased risk for falls.    Baseline 10/26: to be tested in next 1-2 sessions; 11/2: 22/24, 05/15/21: 23/24; 1/24: deferred to next visit    Time 12    Period Weeks    Status On-going    Target Date 09/16/21      PT LONG TERM GOAL #4   Title Pt will report a worst neck pain of no greater than 3/10 within the past two weeks in order to improve QOL.    Baseline 10/27: pain currently 6-7/10. 05/15/21: no current pain, rated 4/10 highest in the last 2 weeks; 1/24: pt reports pain briefly achieved 10/10 over the past week, but has been averaging 7/10.    Time 12    Period Weeks    Status On-going    Target Date 09/16/21      PT LONG TERM GOAL #5   Title Pt will report ability to bend down/over to complete yard work/ADLs without wooziness or decreased postural stability.    Baseline 10/26: Pt reports increased wooziness with bending and FOF/unsteadiness; 05/15/21: Pt reports improving wooziness with bending but is still present.; 1/24: pt reports increased wooziness with bending to 5/10    Time 12    Period Weeks    Status On-going    Target Date 09/16/21                   Plan - 07/03/21 0839     Clinical Impression Statement Session largely focused on pain modulation with manual therapy techniques. Pt TTP throughout C7-T7 but reported pain improvement with CPAs as well as improvement primarily to R side posterior shoulder musculature and R UT with STM and TrP release. Pt noted to have decreased mobility throughout thoracic spine compared to cervical spine. Pt tolerated prone strengthening interventions to target B traps and parascapular musculature. The pt will benefit from futher skilled PT to continue to address pain, dizziness strength and balance to increase QOL and decrease fall risk.  Personal Factors and Comorbidities Age;Past/Current Experience;Sex;Comorbidity 3+;Time since onset of  injury/illness/exacerbation    Comorbidities PMH significant for allergic rhinitis, anxiety, HTN, glaucoma, IBS, pure hypercholesterolemia, thoracic or lumbosacral neuritis or radiculitis, vaginal CA, OA, GERD, osteopenia, meningioma, OSA    Examination-Activity Limitations Bathing;Bed Mobility;Bend;Stairs;Sit;Sleep;Locomotion Level;Hygiene/Grooming    Examination-Participation Restrictions Occupation;Yard Work;Cleaning;Community Activity;Shop    Stability/Clinical Decision Making Evolving/Moderate complexity    Rehab Potential Good    PT Frequency 2x / week    PT Duration 12 weeks    PT Treatment/Interventions ADLs/Self Care Home Management;Biofeedback;Canalith Repostioning;Cryotherapy;Electrical Stimulation;Moist Heat;Traction;Ultrasound;DME Instruction;Gait training;Stair training;Functional mobility training;Therapeutic activities;Therapeutic exercise;Balance training;Neuromuscular re-education;Patient/family education;Manual techniques;Dry needling;Passive range of motion;Energy conservation;Splinting;Taping;Vestibular;Visual/perceptual remediation/compensation;Joint Manipulations    PT Next Visit Plan orthostatics, DGI, 10MWT, gaze stabilization, balance interventions, manual therapy and therex to address cervical mobility, shoulder strengthening    PT Home Exercise Plan Access Code: [email or web address], 11/2: increase performance of seated VOR to 5 min (performed in 30 sec bouts); 11/8: Access Code: [place]; 11/16: Access Code: [email or web address]; no updates; 1/18: ?Access Code: [email or web address]; 1/24: educated pt to reduce VOR interventions to 5 min for pain modulation of neck and to discontinue should she experience increased neck pain, education on performing all HEP within pain-free range and increasing time ambulated by 5 min.    Consulted and Agree with Plan of Care Patient             Patient will benefit from skilled therapeutic intervention in order to improve the following deficits and impairments:  Pain,  Dizziness, Decreased range of motion, Hypomobility, Improper body mechanics, Decreased activity tolerance, Decreased balance, Decreased mobility, Increased muscle spasms, Impaired flexibility, Impaired vision/preception, Postural dysfunction  Visit Diagnosis: Dizziness and giddiness  Cervicalgia  Muscle weakness (generalized)     Problem List Patient Active Problem List   Diagnosis Date Noted   Pulsatile tinnitus of right ear 06/18/2020   Pulsatile neck mass 06/18/2020   OSA (obstructive sleep apnea) 09/19/2019   Anxiety and depression 09/19/2019   Left thyroid nodule 08/08/2019   Neck pain 01/03/2019   Meningioma ([place]) 62/94/7654   Lichen sclerosus 65/07/5463   Vaginal atrophy 03/18/2016   Encounter for follow-up surveillance of vaginal cancer 03/18/2016   Eczema 10/31/2015   Left hand pain 09/10/2015   Osteopenia after menopause 07/26/2015   Menopause 07/26/2015   Irritable colon 07/26/2015   History of cancer of vagina 07/26/2015   Diffuse cystic mastopathy 07/26/2015   Obesity (BMI 30.0-34.9) 07/26/2015   Intermittent low back pain 07/26/2015   Hyperlipemia 07/26/2015   Osteoarthritis 05/13/2015   Allergic rhinitis 05/13/2015   Chronic GERD 05/13/2015   Essential hypertension, benign     Zollie Pee, PT 07/03/2021, 8:53 AM  [place] MAIN [facility] SERVICES [address] [place], [place], [address] Phone: [phone]   Fax:  [phone]  Name: Cybil Senegal MRN: [id number] Date of Birth: August 25, 1945

## 2021-07-08 ENCOUNTER — Ambulatory Visit: Payer: Medicare PPO

## 2021-07-09 ENCOUNTER — Ambulatory Visit: Payer: Medicare PPO

## 2021-07-09 ENCOUNTER — Other Ambulatory Visit: Payer: Self-pay

## 2021-07-09 DIAGNOSIS — M542 Cervicalgia: Secondary | ICD-10-CM | POA: Diagnosis not present

## 2021-07-09 DIAGNOSIS — R42 Dizziness and giddiness: Secondary | ICD-10-CM | POA: Diagnosis not present

## 2021-07-09 DIAGNOSIS — R2681 Unsteadiness on feet: Secondary | ICD-10-CM

## 2021-07-09 DIAGNOSIS — M6281 Muscle weakness (generalized): Secondary | ICD-10-CM

## 2021-07-09 NOTE — Therapy (Signed)
[place] MAIN [facility] SERVICES [address] [place], [place], [address] Phone: [phone]   Fax:  [phone]  Physical Therapy Treatment  Patient Details  Name: Vickie Stewart MRN: [id number] Date of Birth: 1945-10-28 Referring Provider (PT): Vladimir Crofts, MD   Encounter Date: 07/09/2021   PT End of Session - 07/09/21 1828     Visit Number 14    Number of Visits 25    Date for PT Re-Evaluation 06/18/21    Authorization Type eval completed 03/26/2021    PT Start Time 1046    PT Stop Time 1132    PT Time Calculation (min) 46 min    Equipment Utilized During Treatment Gait belt    Activity Tolerance Patient tolerated treatment well    Behavior During Therapy WFL for tasks assessed/performed             Past Medical History:  Diagnosis Date   Allergic rhinitis, cause unspecified    Anxiety state, unspecified    Contact dermatitis and other eczema, due to unspecified cause    Diffuse cystic mastopathy    Dyspepsia and other specified disorders of function of stomach    Essential hypertension, benign    Glaucoma    Irritable bowel syndrome    Lateral epicondylitis  of elbow    Leukocytopenia, unspecified    Mastodynia    Obesity, unspecified    Pure hypercholesterolemia    Reflux esophagitis    Thoracic or lumbosacral neuritis or radiculitis, unspecified    Unspecified disorder of skin and subcutaneous tissue    Vaginal cancer ([place])    History    Past Surgical History:  Procedure Laterality Date   BREAST EXCISIONAL BIOPSY Left    neg   BREAST LUMPECTOMY Left    COLONOSCOPY  08/26/2006   COLONOSCOPY WITH PROPOFOL N/A 10/21/2016   Procedure: COLONOSCOPY WITH PROPOFOL;  Surgeon: Christene Lye, MD;  Location: [facility] ENDOSCOPY;  Service: Endoscopy;  Laterality: N/A;   ESOPHAGOGASTRODUODENOSCOPY (EGD) WITH PROPOFOL N/A 02/07/2020   Procedure: ESOPHAGOGASTRODUODENOSCOPY (EGD) WITH PROPOFOL;  Surgeon: Lin Landsman, MD;  Location: [facility] ENDOSCOPY;  Service: Gastroenterology;  Laterality: N/A;    There were no vitals filed for this visit.   Subjective Assessment - 07/09/21 1825     Subjective Pt reports improvement in neck pain and that she has not had any more muscle spasms. Pt does report she had some L eye ache that "has eased off." Pt reports walking 1 mile/day.    Pertinent History Pt is a 76 yo female presenting to PT with c/o of neck pain, HA and dizziness sx that started >1 year ago. Pt previously seen in PT for neck pain, reports it improved but never fully resolved. Pt has been diagnosed with a "pinched nerve." Prolonged sitting increases her sx which also includes wooziness. Bending increases wooziness. Current pain is 6-7/10. Pt denies any light sensitivty. Pt does have hx of vertigo but reports these symptoms are different. She has difficulty lying on her L side (same side as HA) and can exerpience a "light buzz" sensation. Pt also experiences ringing in her ears sometimes. hx of chronic sinus problems. No falls in the last 6 months. Movement improves sx. Other PMH significant allergic rhinitis, anxiety, HTN, glaucoma, IBS, pure hypercholesterolemia, thoracic or lumbosacral neuritis or radiculitis, vaginal CA, OA, GERD, osteopenia, meningioma, OSA    Limitations Sitting;Lifting;House hold activities   lifting/household activities limited with positions requiring bending   How long  can you sit comfortably? Limited due to increased neck pain/HA. Reports unsure but maybe able to sit for a max of a couple hours if she has been active throughout the day.    How long can you stand comfortably? Pt repots only limited if standing in the shower    How long can you walk comfortably? Pt reports not limited    Diagnostic tests MR BRAIN 04/20/2019:    IMPRESSION:  Right para falcine meningioma is stable from 1 year ago. No mass in  the brain.     Mild chronic microvascular ischemic changes in the white matter  also  stable.  CT ANGIO HEAD 2021: "IMPRESSION:  No large vessel occlusion, high-grade narrowing, dissection or  aneurysm.     No acute intracranial finding. 2.0 cm right para falcine meningioma  is unchanged." and CT ANGIO NECK 2021: "IMPRESSION:  No large vessel occlusion, high-grade narrowing, dissection or  aneurysm.     No acute intracranial finding. 2.0 cm right para falcine meningioma  is unchanged."    Patient Stated Goals Pt would like to be able to sit as long as she'd like    Currently in Pain? Yes    Pain Location Neck    Pain Onset More than a month ago            INTERVENTION:  PT instructs pt in seated p.ball rollouts to address pain felt in back that pt reports extends up to her neck: -Pt performs forward/backward rollouts 10x, an lateral rollouts 5x each direction  Pt prone on plinth:  PT provides CPAs, grade I-II throughout C6-T9 in 2-4 20-30 sec bouts per level.  PT also provides STM and TrP release primarily to L paraspinals C6-T9 x several minutes Comments: Pt continues to be most tender around C6-C7 in prone.  Prone Is 2x10 Prone Ts 2x10 Prone Ys 2x10 Comments: Pt reports as tiring. PT provides hands-on assist to RUE with prone Ys as pt with difficulty with performing intervention due to decreased strength  Prone chin tucks/deep neck flexor endurance training- 2x5 reps with 5 sec holds. Pt reports mild strain felt on L side. Intervention discontinued. PT instructs pt to monitor symptoms.   Pt supine on plinth: For several minutes PT provides gentle cervical traction, STM and TrP release to B cervical paraspinals, with focus on R side. Additionally, PT provides STM and TrP release to R UT, and occipital triangle (pt notes tenderness)  At support surface, gait belt donned, and CGA provided: Tandem stance 2x30 sec each LE On airex- --NBOS EO 2x30 sec --NBOS with horizontal head turns x 10 reps each direction --NBOS with vertical head turns x 10 reps each  direction --WBOS EC 2x60 sec Pt reports no pain with intervention  Pt seated, PT provides additional manual therapy: STM to B upper traps x 4 min CPAs grade I-II to T3 (pt reports most tender) 6x20-30 sec bouts.   Pt educated throughout session about proper posture and technique with exercises. Improved exercise technique, movement at target joints, use of target muscles after min to mod verbal, visual, tactile cues.     PT Education - 07/09/21 1827     Education Details exercise technique, body mechanics    Person(s) Educated Patient    Methods Explanation;Demonstration;Tactile cues;Verbal cues    Comprehension Verbalized understanding;Returned demonstration;Tactile cues required;Need further instruction              PT Short Term Goals - 06/24/21 1309  PT SHORT TERM GOAL #1   Title Pt will be independent with HEP in order to improve strength and balance in order to decrease fall risk and improve function at home and in the community    Baseline 10/27: issued seated VOR and cervical rotation stretch; 05/15/21: Pt reports compliance with HEP everyday except for Sundays and days she has PT    Time 6    Period Weeks    Status Achieved    Target Date 05/07/21               PT Long Term Goals - 06/24/21 1227       PT LONG TERM GOAL #1   Title Pt will decrease DHI score by at least 18 points in order to demonstrate clinically significant reduction in disability    Baseline 10/26: 22/100; 05/15/21: 14/100; 1/14: 10/100    Time 12    Period Weeks    Status On-going    Target Date 09/16/21      PT LONG TERM GOAL #2   Title Patient will increase FOTO score by at least 10 points to demonstrate improvement in mobility and quality of life.    Baseline 10/26: 65, 05/15/21: 60%; 1/24: deferred to next session, unable to assess due to time    Time 12    Period Weeks    Status On-going    Target Date 09/16/21      PT LONG TERM GOAL #3   Title Pt will improve DGI  by at least 2 points in order to demonstrate improvement in balance and decreased risk for falls.    Baseline 10/26: to be tested in next 1-2 sessions; 11/2: 22/24, 05/15/21: 23/24; 1/24: deferred to next visit    Time 12    Period Weeks    Status On-going    Target Date 09/16/21      PT LONG TERM GOAL #4   Title Pt will report a worst neck pain of no greater than 3/10 within the past two weeks in order to improve QOL.    Baseline 10/27: pain currently 6-7/10. 05/15/21: no current pain, rated 4/10 highest in the last 2 weeks; 1/24: pt reports pain briefly achieved 10/10 over the past week, but has been averaging 7/10.    Time 12    Period Weeks    Status On-going    Target Date 09/16/21      PT LONG TERM GOAL #5   Title Pt will report ability to bend down/over to complete yard work/ADLs without wooziness or decreased postural stability.    Baseline 10/26: Pt reports increased wooziness with bending and FOF/unsteadiness; 05/15/21: Pt reports improving wooziness with bending but is still present.; 1/24: pt reports increased wooziness with bending to 5/10    Time 12    Period Weeks    Status On-going    Target Date 09/16/21                   Plan - 07/09/21 1847     Clinical Impression Statement Continued initial focus on manual therapy for pain modulation and to assist in improving cervicothoracic mobility. Pt found to be TTP over T3 and reported that this felt like the pain that bothers her most in seated and refers up her back to her neck. Pt did report positive response to CPA mob in this region. Pt also found to have decreased lower trap strength and endurance, as well as quick onset of fatigue with all prone  strengthening. Will continue interventions to target this region. Following initial manual interventions, pt was able to sustain compliant surface balance training with head turns without pain. Pt was most challenged performing head turns and EC on compliant surface,  requiring intermittent UE support. The pt will benefit from further skilled PT to improve pain, dizziness, strength and balance to increase QOL and decrease fall risk.    Personal Factors and Comorbidities Age;Past/Current Experience;Sex;Comorbidity 3+;Time since onset of injury/illness/exacerbation    Comorbidities PMH significant for allergic rhinitis, anxiety, HTN, glaucoma, IBS, pure hypercholesterolemia, thoracic or lumbosacral neuritis or radiculitis, vaginal CA, OA, GERD, osteopenia, meningioma, OSA    Examination-Activity Limitations Bathing;Bed Mobility;Bend;Stairs;Sit;Sleep;Locomotion Level;Hygiene/Grooming    Examination-Participation Restrictions Occupation;Yard Work;Cleaning;Community Activity;Shop    Stability/Clinical Decision Making Evolving/Moderate complexity    Rehab Potential Good    PT Frequency 2x / week    PT Duration 12 weeks    PT Treatment/Interventions ADLs/Self Care Home Management;Biofeedback;Canalith Repostioning;Cryotherapy;Electrical Stimulation;Moist Heat;Traction;Ultrasound;DME Instruction;Gait training;Stair training;Functional mobility training;Therapeutic activities;Therapeutic exercise;Balance training;Neuromuscular re-education;Patient/family education;Manual techniques;Dry needling;Passive range of motion;Energy conservation;Splinting;Taping;Vestibular;Visual/perceptual remediation/compensation;Joint Manipulations    PT Next Visit Plan orthostatics, DGI, 10MWT, gaze stabilization, balance interventions, manual therapy and therex to address cervical mobility, shoulder strengthening    PT Home Exercise Plan Access Code: [email or web address], 11/2: increase performance of seated VOR to 5 min (performed in 30 sec bouts); 11/8: Access Code: [place]; 11/16: Access Code: [email or web address]; no updates; 1/18: ?Access Code: [email or web address]; 1/24: educated pt to reduce VOR interventions to 5 min for pain modulation of neck and to discontinue should she experience increased neck pain, education on  performing all HEP within pain-free range and increasing time ambulated by 5 min.    Consulted and Agree with Plan of Care Patient             Patient will benefit from skilled therapeutic intervention in order to improve the following deficits and impairments:  Pain, Dizziness, Decreased range of motion, Hypomobility, Improper body mechanics, Decreased activity tolerance, Decreased balance, Decreased mobility, Increased muscle spasms, Impaired flexibility, Impaired vision/preception, Postural dysfunction  Visit Diagnosis: Dizziness and giddiness  Muscle weakness (generalized)  Unsteadiness on feet     Problem List Patient Active Problem List   Diagnosis Date Noted   Pulsatile tinnitus of right ear 06/18/2020   Pulsatile neck mass 06/18/2020   OSA (obstructive sleep apnea) 09/19/2019   Anxiety and depression 09/19/2019   Left thyroid nodule 08/08/2019   Neck pain 01/03/2019   Meningioma ([place]) 17/51/0258   Lichen sclerosus 52/77/8242   Vaginal atrophy 03/18/2016   Encounter for follow-up surveillance of vaginal cancer 03/18/2016   Eczema 10/31/2015   Left hand pain 09/10/2015   Osteopenia after menopause 07/26/2015   Menopause 07/26/2015   Irritable colon 07/26/2015   History of cancer of vagina 07/26/2015   Diffuse cystic mastopathy 07/26/2015   Obesity (BMI 30.0-34.9) 07/26/2015   Intermittent low back pain 07/26/2015   Hyperlipemia 07/26/2015   Osteoarthritis 05/13/2015   Allergic rhinitis 05/13/2015   Chronic GERD 05/13/2015   Essential hypertension, benign     Zollie Pee, PT 07/09/2021, 6:55 PM  [place] MAIN [facility] SERVICES [address] [place], [place], [address] Phone: [phone]   Fax:  [phone]  Name: Vickie Stewart MRN: [id number] Date of Birth: 11-Feb-1946

## 2021-07-10 ENCOUNTER — Ambulatory Visit: Payer: Medicare PPO

## 2021-07-15 ENCOUNTER — Ambulatory Visit: Payer: Medicare PPO

## 2021-07-17 ENCOUNTER — Ambulatory Visit: Payer: Medicare PPO

## 2021-07-18 ENCOUNTER — Ambulatory Visit: Payer: Medicare PPO

## 2021-07-22 ENCOUNTER — Ambulatory Visit: Payer: Medicare PPO

## 2021-07-24 ENCOUNTER — Ambulatory Visit: Payer: Medicare PPO

## 2021-07-24 DIAGNOSIS — R2681 Unsteadiness on feet: Secondary | ICD-10-CM | POA: Diagnosis not present

## 2021-07-24 DIAGNOSIS — R42 Dizziness and giddiness: Secondary | ICD-10-CM | POA: Diagnosis not present

## 2021-07-24 DIAGNOSIS — M6281 Muscle weakness (generalized): Secondary | ICD-10-CM

## 2021-07-24 DIAGNOSIS — M542 Cervicalgia: Secondary | ICD-10-CM | POA: Diagnosis not present

## 2021-07-24 NOTE — Therapy (Signed)
[place] MAIN [facility] SERVICES [place], [place], [address] Phone: [phone]   Fax:  [phone]  Physical Therapy Treatment  Patient Details  Name: Vickie Stewart MRN: [id number] Date of Birth: Oct 17, 1945 Referring Provider (PT): Vladimir Crofts, MD   Encounter Date: 07/24/2021   PT End of Session - 07/24/21 1155     Visit Number 15    Number of Visits 25    Date for PT Re-Evaluation 06/18/21    Authorization Type eval completed 03/26/2021    PT Start Time 1147    PT Stop Time 1228    PT Time Calculation (min) 41 min    Equipment Utilized During Treatment Gait belt    Activity Tolerance Patient tolerated treatment well    Behavior During Therapy WFL for tasks assessed/performed             Past Medical History:  Diagnosis Date   Allergic rhinitis, cause unspecified    Anxiety state, unspecified    Contact dermatitis and other eczema, due to unspecified cause    Diffuse cystic mastopathy    Dyspepsia and other specified disorders of function of stomach    Essential hypertension, benign    Glaucoma    Irritable bowel syndrome    Lateral epicondylitis  of elbow    Leukocytopenia, unspecified    Mastodynia    Obesity, unspecified    Pure hypercholesterolemia    Reflux esophagitis    Thoracic or lumbosacral neuritis or radiculitis, unspecified    Unspecified disorder of skin and subcutaneous tissue    Vaginal cancer ([place])    History    Past Surgical History:  Procedure Laterality Date   BREAST EXCISIONAL BIOPSY Left    neg   BREAST LUMPECTOMY Left    COLONOSCOPY  08/26/2006   COLONOSCOPY WITH PROPOFOL N/A 10/21/2016   Procedure: COLONOSCOPY WITH PROPOFOL;  Surgeon: Christene Lye, MD;  Location: [facility] ENDOSCOPY;  Service: Endoscopy;  Laterality: N/A;   ESOPHAGOGASTRODUODENOSCOPY (EGD) WITH PROPOFOL N/A 02/07/2020   Procedure: ESOPHAGOGASTRODUODENOSCOPY (EGD) WITH PROPOFOL;  Surgeon: Lin Landsman, MD;  Location: [facility] ENDOSCOPY;  Service: Gastroenterology;  Laterality: N/A;    There were no vitals filed for this visit.  Subjective Assessment - 07/25/21 0823     Subjective Pt reports no neck pain currently. She reports no muscle spasms. Pt reports no falls, but that she has experienced "some wobbling." Pt no longer wearing wrist brace. She hsa been walking more. Per pt remaining symptoms consist mostly of lightheaded feeling when first waking up and getting up. Pt feels her symptoms have been improving overall.    Pertinent History Pt is a 76 yo female presenting to PT with c/o of neck pain, HA and dizziness sx that started >1 year ago. Pt previously seen in PT for neck pain, reports it improved but never fully resolved. Pt has been diagnosed with a "pinched nerve." Prolonged sitting increases her sx which also includes wooziness. Bending increases wooziness. Current pain is 6-7/10. Pt denies any light sensitivty. Pt does have hx of vertigo but reports these symptoms are different. She has difficulty lying on her L side (same side as HA) and can exerpience a "light buzz" sensation. Pt also experiences ringing in her ears sometimes. hx of chronic sinus problems. No falls in the last 6 months. Movement improves sx. Other PMH significant allergic rhinitis, anxiety, HTN, glaucoma, IBS, pure hypercholesterolemia, thoracic or lumbosacral neuritis or radiculitis, vaginal CA, OA, GERD, osteopenia,  meningioma, OSA    Limitations Sitting;Lifting;House hold activities   lifting/household activities limited with positions requiring bending   How long can you sit comfortably? Limited due to increased neck pain/HA. Reports unsure but maybe able to sit for a max of a couple hours if she has been active throughout the day.    How long can you stand comfortably? Pt repots only limited if standing in the shower    How long can you walk comfortably? Pt reports not limited    Diagnostic tests MR BRAIN  04/20/2019:    IMPRESSION:  Right para falcine meningioma is stable from 1 year ago. No mass in  the brain.     Mild chronic microvascular ischemic changes in the white matter also  stable.  CT ANGIO HEAD 2021: "IMPRESSION:  No large vessel occlusion, high-grade narrowing, dissection or  aneurysm.     No acute intracranial finding. 2.0 cm right para falcine meningioma  is unchanged." and CT ANGIO NECK 2021: "IMPRESSION:  No large vessel occlusion, high-grade narrowing, dissection or  aneurysm.     No acute intracranial finding. 2.0 cm right para falcine meningioma  is unchanged."    Patient Stated Goals Pt would like to be able to sit as long as she'd like    Currently in Pain? No/denies    Pain Onset More than a month ago              Subjective Assessment - 07/25/21 0823     Subjective Pt reports no neck pain currently. She reports no muscle spasms. Pt reports no falls, but that she has experienced "some wobbling." Pt no longer wearing wrist brace. She hsa been walking more. Per pt remaining symptoms consist mostly of lightheaded feeling when first waking up and getting up. Pt feels her symptoms have been improving overall.    Pertinent History Pt is a 76 yo female presenting to PT with c/o of neck pain, HA and dizziness sx that started >1 year ago. Pt previously seen in PT for neck pain, reports it improved but never fully resolved. Pt has been diagnosed with a "pinched nerve." Prolonged sitting increases her sx which also includes wooziness. Bending increases wooziness. Current pain is 6-7/10. Pt denies any light sensitivty. Pt does have hx of vertigo but reports these symptoms are different. She has difficulty lying on her L side (same side as HA) and can exerpience a "light buzz" sensation. Pt also experiences ringing in her ears sometimes. hx of chronic sinus problems. No falls in the last 6 months. Movement improves sx. Other PMH significant allergic rhinitis, anxiety, HTN, glaucoma, IBS, pure  hypercholesterolemia, thoracic or lumbosacral neuritis or radiculitis, vaginal CA, OA, GERD, osteopenia, meningioma, OSA    Limitations Sitting;Lifting;House hold activities   lifting/household activities limited with positions requiring bending   How long can you sit comfortably? Limited due to increased neck pain/HA. Reports unsure but maybe able to sit for a max of a couple hours if she has been active throughout the day.    How long can you stand comfortably? Pt repots only limited if standing in the shower    How long can you walk comfortably? Pt reports not limited    Diagnostic tests MR BRAIN 04/20/2019:    IMPRESSION:  Right para falcine meningioma is stable from 1 year ago. No mass in  the brain.     Mild chronic microvascular ischemic changes in the white matter also  stable.  CT ANGIO HEAD 2021: "IMPRESSION:  No large vessel occlusion, high-grade narrowing, dissection or  aneurysm.     No acute intracranial finding. 2.0 cm right para falcine meningioma  is unchanged." and CT ANGIO NECK 2021: "IMPRESSION:  No large vessel occlusion, high-grade narrowing, dissection or  aneurysm.     No acute intracranial finding. 2.0 cm right para falcine meningioma  is unchanged."    Patient Stated Goals Pt would like to be able to sit as long as she'd like    Currently in Pain? No/denies    Pain Onset More than a month ago            INTERVENTION:   Orthostatics:  TherEx: Supine: 131/70 mmHg HR 51 bpm Seated:  122/65 mmHg HR 54 bpm Pt endorses mild lightheaded sensation upon sitting Standing: 129/64 mmHg HR 57 bpm Pt endorses slight lightheaded sensation upon standing.  Comments: pt reports HR in 50s is typical for her   Scapular squeezes 10x with 3 sec holds   Neuro: Gait belt donned and PT providing CGA throughout unless otherwise specified: Body rolls against wall 12x length of wall Comments: Pt reports wooziness reaches 1/10.  Visually tracking ball pass with turning to look  behind/over shoulder 6x length of 10 meters  Visually tracking ball toss between BUEs - 6x length of 10 meters  VORx1, plain background: Horizontal head turns in seim-tandem 1x30 sec each LE Vertical head turns in NBOS 1x30 sec Comments: No dizziness with intervention  Two target VOR in NBOS 2x30 sec Comments: Pt rates 1/10 wooziness  SLB: 2x30 sec each LE: able to sustain 10 sec of balance each LE  Manual: x 9 min Pt supine on plinth- PT provides STM and TrP release to B cervical paraspinals and B shoulder musculature. PT provides gentle cervical traction and sustained B UT stretch.   Pt educated throughout session about proper posture and technique with exercises. Improved exercise technique, movement at target joints, use of target muscles after min to mod verbal, visual, tactile cues.      PT Education - 07/25/21 0822     Education Details exercise technique    Person(s) Educated Patient    Methods Explanation;Demonstration;Verbal cues    Comprehension Verbalized understanding;Returned demonstration;Need further instruction              PT Short Term Goals - 06/24/21 1309       PT SHORT TERM GOAL #1   Title Pt will be independent with HEP in order to improve strength and balance in order to decrease fall risk and improve function at home and in the community    Baseline 10/27: issued seated VOR and cervical rotation stretch; 05/15/21: Pt reports compliance with HEP everyday except for Sundays and days she has PT    Time 6    Period Weeks    Status Achieved    Target Date 05/07/21               PT Long Term Goals - 06/24/21 1227       PT LONG TERM GOAL #1   Title Pt will decrease DHI score by at least 18 points in order to demonstrate clinically significant reduction in disability    Baseline 10/26: 22/100; 05/15/21: 14/100; 1/14: 10/100    Time 12    Period Weeks    Status On-going    Target Date 09/16/21      PT LONG TERM GOAL #2   Title Patient  will increase FOTO score by at least 10  points to demonstrate improvement in mobility and quality of life.    Baseline 10/26: 65, 05/15/21: 60%; 1/24: deferred to next session, unable to assess due to time    Time 12    Period Weeks    Status On-going    Target Date 09/16/21      PT LONG TERM GOAL #3   Title Pt will improve DGI by at least 2 points in order to demonstrate improvement in balance and decreased risk for falls.    Baseline 10/26: to be tested in next 1-2 sessions; 11/2: 22/24, 05/15/21: 23/24; 1/24: deferred to next visit    Time 12    Period Weeks    Status On-going    Target Date 09/16/21      PT LONG TERM GOAL #4   Title Pt will report a worst neck pain of no greater than 3/10 within the past two weeks in order to improve QOL.    Baseline 10/27: pain currently 6-7/10. 05/15/21: no current pain, rated 4/10 highest in the last 2 weeks; 1/24: pt reports pain briefly achieved 10/10 over the past week, but has been averaging 7/10.    Time 12    Period Weeks    Status On-going    Target Date 09/16/21      PT LONG TERM GOAL #5   Title Pt will report ability to bend down/over to complete yard work/ADLs without wooziness or decreased postural stability.    Baseline 10/26: Pt reports increased wooziness with bending and FOF/unsteadiness; 05/15/21: Pt reports improving wooziness with bending but is still present.; 1/24: pt reports increased wooziness with bending to 5/10    Time 12    Period Weeks    Status On-going    Target Date 09/16/21                   Plan - 07/25/21 0831     Clinical Impression Statement Patient highly motivated to participate in session.  Patient responds well to interventions with reaching no more than 1 out of 10 dizziness/wooziness.  Per patient report her symptoms have overall been improving.  Patient does endorse mild lightheadedness upon changing positions from supine to seated and seated to standing with orthostatics, however, patient  blood pressure numbers did not exhibit a significant drop.  The patient symptoms otherwise were only triggered with 2 target VOR and body roll exercise.  The patient will benefit from further skilled PT to continue to improve, strength and balance to increase quality of life and decrease fall risk.    Personal Factors and Comorbidities Age;Past/Current Experience;Sex;Comorbidity 3+;Time since onset of injury/illness/exacerbation    Comorbidities PMH significant for allergic rhinitis, anxiety, HTN, glaucoma, IBS, pure hypercholesterolemia, thoracic or lumbosacral neuritis or radiculitis, vaginal CA, OA, GERD, osteopenia, meningioma, OSA    Examination-Activity Limitations Bathing;Bed Mobility;Bend;Stairs;Sit;Sleep;Locomotion Level;Hygiene/Grooming    Examination-Participation Restrictions Occupation;Yard Work;Cleaning;Community Activity;Shop    Stability/Clinical Decision Making Evolving/Moderate complexity    Rehab Potential Good    PT Frequency 2x / week    PT Duration 12 weeks    PT Treatment/Interventions ADLs/Self Care Home Management;Biofeedback;Canalith Repostioning;Cryotherapy;Electrical Stimulation;Moist Heat;Traction;Ultrasound;DME Instruction;Gait training;Stair training;Functional mobility training;Therapeutic activities;Therapeutic exercise;Balance training;Neuromuscular re-education;Patient/family education;Manual techniques;Dry needling;Passive range of motion;Energy conservation;Splinting;Taping;Vestibular;Visual/perceptual remediation/compensation;Joint Manipulations    PT Next Visit Plan orthostatics, DGI, 10MWT, gaze stabilization, balance interventions, manual therapy and therex to address cervical mobility, shoulder strengthening; continue POC as previously indicated    PT Home Exercise Plan Access Code: [email or web address], 11/2: increase performance of seated VOR to 5  min (performed in 30 sec bouts); 11/8: Access Code: [email or web address]; 11/16: Access Code: [email or web address]; no updates; 1/18: ?Access Code:  [email or web address]; 1/24: educated pt to reduce VOR interventions to 5 min for pain modulation of neck and to discontinue should she experience increased neck pain, education on performing all HEP within pain-free range and increasing time ambulated by 5 min.; no updates    Consulted and Agree with Plan of Care Patient             Patient will benefit from skilled therapeutic intervention in order to improve the following deficits and impairments:  Pain, Dizziness, Decreased range of motion, Hypomobility, Improper body mechanics, Decreased activity tolerance, Decreased balance, Decreased mobility, Increased muscle spasms, Impaired flexibility, Impaired vision/preception, Postural dysfunction  Visit Diagnosis: Muscle weakness (generalized)  Dizziness and giddiness  Unsteadiness on feet     Problem List Patient Active Problem List   Diagnosis Date Noted   Pulsatile tinnitus of right ear 06/18/2020   Pulsatile neck mass 06/18/2020   OSA (obstructive sleep apnea) 09/19/2019   Anxiety and depression 09/19/2019   Left thyroid nodule 08/08/2019   Neck pain 01/03/2019   Meningioma ([place]) 17/00/1749   Lichen sclerosus 44/96/7591   Vaginal atrophy 03/18/2016   Encounter for follow-up surveillance of vaginal cancer 03/18/2016   Eczema 10/31/2015   Left hand pain 09/10/2015   Osteopenia after menopause 07/26/2015   Menopause 07/26/2015   Irritable colon 07/26/2015   History of cancer of vagina 07/26/2015   Diffuse cystic mastopathy 07/26/2015   Obesity (BMI 30.0-34.9) 07/26/2015   Intermittent low back pain 07/26/2015   Hyperlipemia 07/26/2015   Osteoarthritis 05/13/2015   Allergic rhinitis 05/13/2015   Chronic GERD 05/13/2015   Essential hypertension, benign     Zollie Pee, PT 07/25/2021, 9:54 AM  [place] MAIN [facility] SERVICES [address] [place], [place], [address] Phone: [phone]   Fax:  [phone]  Name: Madisin Hasan MRN: [id number] Date of Birth: Jul 04, 1945

## 2021-07-29 ENCOUNTER — Ambulatory Visit: Payer: Medicare PPO

## 2021-07-30 ENCOUNTER — Ambulatory Visit: Payer: Medicare PPO

## 2021-07-31 ENCOUNTER — Other Ambulatory Visit: Payer: Self-pay

## 2021-07-31 ENCOUNTER — Ambulatory Visit: Payer: Medicare PPO | Attending: Neurology

## 2021-07-31 DIAGNOSIS — M542 Cervicalgia: Secondary | ICD-10-CM | POA: Diagnosis not present

## 2021-07-31 DIAGNOSIS — R42 Dizziness and giddiness: Secondary | ICD-10-CM | POA: Diagnosis not present

## 2021-07-31 DIAGNOSIS — M6281 Muscle weakness (generalized): Secondary | ICD-10-CM | POA: Insufficient documentation

## 2021-07-31 DIAGNOSIS — R2689 Other abnormalities of gait and mobility: Secondary | ICD-10-CM | POA: Diagnosis not present

## 2021-07-31 DIAGNOSIS — R2681 Unsteadiness on feet: Secondary | ICD-10-CM | POA: Insufficient documentation

## 2021-07-31 NOTE — Therapy (Signed)
[place] MAIN [facility] SERVICES [address] [place], [place], [address] Phone: [phone]   Fax:  [phone]  Physical Therapy Treatment  Patient Details  Name: Vickie Stewart MRN: [id number] Date of Birth: 1945-08-28 Referring Provider (PT): Vladimir Crofts, MD   Encounter Date: 07/31/2021   PT End of Session - 07/31/21 1153     Visit Number 16    Number of Visits 25    Date for PT Re-Evaluation 09/16/21   corrected to reflecct recert   Authorization Type eval completed 03/26/2021    PT Start Time 1149    PT Stop Time 1231    PT Time Calculation (min) 42 min    Equipment Utilized During Treatment Gait belt    Activity Tolerance Patient tolerated treatment well    Behavior During Therapy WFL for tasks assessed/performed             Past Medical History:  Diagnosis Date   Allergic rhinitis, cause unspecified    Anxiety state, unspecified    Contact dermatitis and other eczema, due to unspecified cause    Diffuse cystic mastopathy    Dyspepsia and other specified disorders of function of stomach    Essential hypertension, benign    Glaucoma    Irritable bowel syndrome    Lateral epicondylitis  of elbow    Leukocytopenia, unspecified    Mastodynia    Obesity, unspecified    Pure hypercholesterolemia    Reflux esophagitis    Thoracic or lumbosacral neuritis or radiculitis, unspecified    Unspecified disorder of skin and subcutaneous tissue    Vaginal cancer ([place])    History    Past Surgical History:  Procedure Laterality Date   BREAST EXCISIONAL BIOPSY Left    neg   BREAST LUMPECTOMY Left    COLONOSCOPY  08/26/2006   COLONOSCOPY WITH PROPOFOL N/A 10/21/2016   Procedure: COLONOSCOPY WITH PROPOFOL;  Surgeon: Christene Lye, MD;  Location: [facility] ENDOSCOPY;  Service: Endoscopy;  Laterality: N/A;   ESOPHAGOGASTRODUODENOSCOPY (EGD) WITH PROPOFOL N/A 02/07/2020   Procedure: ESOPHAGOGASTRODUODENOSCOPY (EGD) WITH  PROPOFOL;  Surgeon: Lin Landsman, MD;  Location: [facility] ENDOSCOPY;  Service: Gastroenterology;  Laterality: N/A;    There were no vitals filed for this visit.   Subjective Assessment - 08/01/21 0813     Subjective Pt reports she has experience a "big imrpovement" with her dizziness. However, pt reports continued sensation of pressure and some ligthheadeness in head/neck region. Thinks it is impacted by neck muscles.    Pertinent History Pt is a 76 yo female presenting to PT with c/o of neck pain, HA and dizziness sx that started >1 year ago. Pt previously seen in PT for neck pain, reports it improved but never fully resolved. Pt has been diagnosed with a "pinched nerve." Prolonged sitting increases her sx which also includes wooziness. Bending increases wooziness. Current pain is 6-7/10. Pt denies any light sensitivty. Pt does have hx of vertigo but reports these symptoms are different. She has difficulty lying on her L side (same side as HA) and can exerpience a "light buzz" sensation. Pt also experiences ringing in her ears sometimes. hx of chronic sinus problems. No falls in the last 6 months. Movement improves sx. Other PMH significant allergic rhinitis, anxiety, HTN, glaucoma, IBS, pure hypercholesterolemia, thoracic or lumbosacral neuritis or radiculitis, vaginal CA, OA, GERD, osteopenia, meningioma, OSA    Limitations Sitting;Lifting;House hold activities   lifting/household activities limited with positions requiring bending  How long can you sit comfortably? Limited due to increased neck pain/HA. Reports unsure but maybe able to sit for a max of a couple hours if she has been active throughout the day.    How long can you stand comfortably? Pt repots only limited if standing in the shower    How long can you walk comfortably? Pt reports not limited    Diagnostic tests MR BRAIN 04/20/2019:    IMPRESSION:  Right para falcine meningioma is stable from 1 year ago. No mass in  the brain.      Mild chronic microvascular ischemic changes in the white matter also  stable.  CT ANGIO HEAD 2021: "IMPRESSION:  No large vessel occlusion, high-grade narrowing, dissection or  aneurysm.     No acute intracranial finding. 2.0 cm right para falcine meningioma  is unchanged." and CT ANGIO NECK 2021: "IMPRESSION:  No large vessel occlusion, high-grade narrowing, dissection or  aneurysm.     No acute intracranial finding. 2.0 cm right para falcine meningioma  is unchanged."    Patient Stated Goals Pt would like to be able to sit as long as she'd like    Currently in Pain? No/denies    Pain Onset More than a month ago              INTERVENTIONS:  Scapular squeezes 10x with 3 sec holds  Seated thoracic extension over chair 10x cuing for movement in thoracic region (pt compensates with only cervical ext). Pt exhibits increased contraction of cervical musculature with intervention.  Standing RTB rows 15x; no pain with intervention Standing GTB rows 20x; no pain with intervention   Manual: Pt seated STM and TrP release provide to R posterior shoulder musculature and cervical paraspinals. Pt with  TrP in R UT near lower cervical parapsinals x 5 min   Neuro: Gait belt donned and PT providing CGA throughout unless otherwise specified: Body rolls against wall x several minutes; pt reports minimal increase in symptoms Visual tracking with multicolor ball bend and reach 10x; no increase in symptoms  Ambulating in hallway reading sticky notes for horzintal head turns - 2x length of hallway; one instance of unsteadiness, and 1/10 wooziness Ambulating in hallway with vertical head turns 2x length of hallway. Pt reports feeling steady overall with intervention.  At support bar- Pt standing on half bosu (round side up) WBOS x 3 min. Pt uses intermittent UE support on bar.  Pt standing on airex, NBOS, 2 target VOR x 2 min - intermittent UE support. No increased dizziness/wooziness. NBOS, EC, on airex x  60 sec. No LOB, does not require intermittent UE support.     Pt educated throughout session about proper posture and technique with exercises. Improved exercise technique, movement at target joints, use of target muscles after min to mod verbal, visual, tactile cues.      PT Education - 08/01/21 0811     Education Details body mechanics, exercise technique    Methods Explanation;Demonstration;Verbal cues    Comprehension Verbalized understanding;Returned demonstration;Need further instruction              PT Short Term Goals - 06/24/21 1309       PT SHORT TERM GOAL #1   Title Pt will be independent with HEP in order to improve strength and balance in order to decrease fall risk and improve function at home and in the community    Baseline 10/27: issued seated VOR and cervical rotation stretch; 05/15/21: Pt reports compliance  with HEP everyday except for Sundays and days she has PT    Time 6    Period Weeks    Status Achieved    Target Date 05/07/21               PT Long Term Goals - 06/24/21 1227       PT LONG TERM GOAL #1   Title Pt will decrease DHI score by at least 18 points in order to demonstrate clinically significant reduction in disability    Baseline 10/26: 22/100; 05/15/21: 14/100; 1/14: 10/100    Time 12    Period Weeks    Status On-going    Target Date 09/16/21      PT LONG TERM GOAL #2   Title Patient will increase FOTO score by at least 10 points to demonstrate improvement in mobility and quality of life.    Baseline 10/26: 65, 05/15/21: 60%; 1/24: deferred to next session, unable to assess due to time    Time 12    Period Weeks    Status On-going    Target Date 09/16/21      PT LONG TERM GOAL #3   Title Pt will improve DGI by at least 2 points in order to demonstrate improvement in balance and decreased risk for falls.    Baseline 10/26: to be tested in next 1-2 sessions; 11/2: 22/24, 05/15/21: 23/24; 1/24: deferred to next visit    Time 12     Period Weeks    Status On-going    Target Date 09/16/21      PT LONG TERM GOAL #4   Title Pt will report a worst neck pain of no greater than 3/10 within the past two weeks in order to improve QOL.    Baseline 10/27: pain currently 6-7/10. 05/15/21: no current pain, rated 4/10 highest in the last 2 weeks; 1/24: pt reports pain briefly achieved 10/10 over the past week, but has been averaging 7/10.    Time 12    Period Weeks    Status On-going    Target Date 09/16/21      PT LONG TERM GOAL #5   Title Pt will report ability to bend down/over to complete yard work/ADLs without wooziness or decreased postural stability.    Baseline 10/26: Pt reports increased wooziness with bending and FOF/unsteadiness; 05/15/21: Pt reports improving wooziness with bending but is still present.; 1/24: pt reports increased wooziness with bending to 5/10    Time 12    Period Weeks    Status On-going    Target Date 09/16/21                   Plan - 08/01/21 0818     Clinical Impression Statement Pt responds well to majority of interventions without increase in dizziness/wooziness symptoms. Pt reports overall improvement in these symptoms. Pt still with slight increase in dizziness with body roll interventoin, however, this decreases quickly to baseline with rest. Pt overall most challenged with compliant surfaces. Pt would also like to improve her ability to walk in dimly lit environments, reports feeling very cautious attempting this. Pt will continue to benefit from further skilled PT to improve balance, dizziness/wooziness, and strength in order to decrease fall risk.    Personal Factors and Comorbidities Age;Past/Current Experience;Sex;Comorbidity 3+;Time since onset of injury/illness/exacerbation    Comorbidities PMH significant for allergic rhinitis, anxiety, HTN, glaucoma, IBS, pure hypercholesterolemia, thoracic or lumbosacral neuritis or radiculitis, vaginal CA, OA, GERD, osteopenia,  meningioma, OSA  Examination-Activity Limitations Bathing;Bed Mobility;Bend;Stairs;Sit;Sleep;Locomotion Level;Hygiene/Grooming    Examination-Participation Restrictions Occupation;Yard Work;Cleaning;Community Activity;Shop    Stability/Clinical Decision Making Evolving/Moderate complexity    Rehab Potential Good    PT Frequency 2x / week    PT Duration 12 weeks    PT Treatment/Interventions ADLs/Self Care Home Management;Biofeedback;Canalith Repostioning;Cryotherapy;Electrical Stimulation;Moist Heat;Traction;Ultrasound;DME Instruction;Gait training;Stair training;Functional mobility training;Therapeutic activities;Therapeutic exercise;Balance training;Neuromuscular re-education;Patient/family education;Manual techniques;Dry needling;Passive range of motion;Energy conservation;Splinting;Taping;Vestibular;Visual/perceptual remediation/compensation;Joint Manipulations    PT Next Visit Plan orthostatics, DGI, 10MWT, gaze stabilization, balance interventions, manual therapy and therex to address cervical mobility, shoulder strengthening; continue POC as previously indicated    PT Home Exercise Plan Access Code: [email or web address], 11/2: increase performance of seated VOR to 5 min (performed in 30 sec bouts); 11/8: Access Code: [email or web address]; 11/16: Access Code: [email or web address]; no updates; 1/18: ?Access Code: [email or web address]; 1/24: educated pt to reduce VOR interventions to 5 min for pain modulation of neck and to discontinue should she experience increased neck pain, education on performing all HEP within pain-free range and increasing time ambulated by 5 min.; no updates    Consulted and Agree with Plan of Care Patient             Patient will benefit from skilled therapeutic intervention in order to improve the following deficits and impairments:  Pain, Dizziness, Decreased range of motion, Hypomobility, Improper body mechanics, Decreased activity tolerance, Decreased balance, Decreased mobility, Increased muscle spasms,  Impaired flexibility, Impaired vision/preception, Postural dysfunction  Visit Diagnosis: Unsteadiness on feet  Dizziness and giddiness  Muscle weakness (generalized)  Cervicalgia     Problem List Patient Active Problem List   Diagnosis Date Noted   Pulsatile tinnitus of right ear 06/18/2020   Pulsatile neck mass 06/18/2020   OSA (obstructive sleep apnea) 09/19/2019   Anxiety and depression 09/19/2019   Left thyroid nodule 08/08/2019   Neck pain 01/03/2019   Meningioma ([place]) 12/75/1700   Lichen sclerosus 17/49/4496   Vaginal atrophy 03/18/2016   Encounter for follow-up surveillance of vaginal cancer 03/18/2016   Eczema 10/31/2015   Left hand pain 09/10/2015   Osteopenia after menopause 07/26/2015   Menopause 07/26/2015   Irritable colon 07/26/2015   History of cancer of vagina 07/26/2015   Diffuse cystic mastopathy 07/26/2015   Obesity (BMI 30.0-34.9) 07/26/2015   Intermittent low back pain 07/26/2015   Hyperlipemia 07/26/2015   Osteoarthritis 05/13/2015   Allergic rhinitis 05/13/2015   Chronic GERD 05/13/2015   Essential hypertension, benign     Zollie Pee, PT 08/01/2021, 8:21 AM  [place] MAIN [facility] SERVICES [address] [place], [place], [address] Phone: [phone]   Fax:  [phone]  Name: Romi Rathel MRN: [id number] Date of Birth: Oct 19, 1945

## 2021-08-04 ENCOUNTER — Other Ambulatory Visit: Payer: Self-pay | Admitting: Family Medicine

## 2021-08-04 DIAGNOSIS — G8929 Other chronic pain: Secondary | ICD-10-CM

## 2021-08-04 DIAGNOSIS — M542 Cervicalgia: Secondary | ICD-10-CM

## 2021-08-06 ENCOUNTER — Ambulatory Visit: Payer: Medicare PPO

## 2021-08-06 ENCOUNTER — Other Ambulatory Visit: Payer: Self-pay

## 2021-08-06 DIAGNOSIS — M6281 Muscle weakness (generalized): Secondary | ICD-10-CM | POA: Diagnosis not present

## 2021-08-06 DIAGNOSIS — R42 Dizziness and giddiness: Secondary | ICD-10-CM

## 2021-08-06 DIAGNOSIS — R2681 Unsteadiness on feet: Secondary | ICD-10-CM

## 2021-08-06 DIAGNOSIS — M542 Cervicalgia: Secondary | ICD-10-CM | POA: Diagnosis not present

## 2021-08-06 DIAGNOSIS — R2689 Other abnormalities of gait and mobility: Secondary | ICD-10-CM | POA: Diagnosis not present

## 2021-08-06 NOTE — Therapy (Signed)
[place] MAIN [facility] SERVICES [address] [place], [place], [address] Phone: [phone]   Fax:  [phone]  Physical Therapy Treatment  Patient Details  Name: Elayna Tobler MRN: [id number] Date of Birth: July 28, 1945 Referring Provider (PT): Vladimir Crofts, MD   Encounter Date: 08/06/2021   PT End of Session - 08/06/21 1321     Visit Number 17    Number of Visits 25    Date for PT Re-Evaluation 09/16/21   corrected to reflecct recert   Authorization Type eval completed 03/26/2021    PT Start Time 1104    PT Stop Time 1146    PT Time Calculation (min) 42 min    Equipment Utilized During Treatment Gait belt    Activity Tolerance Patient tolerated treatment well    Behavior During Therapy WFL for tasks assessed/performed             Past Medical History:  Diagnosis Date   Allergic rhinitis, cause unspecified    Anxiety state, unspecified    Contact dermatitis and other eczema, due to unspecified cause    Diffuse cystic mastopathy    Dyspepsia and other specified disorders of function of stomach    Essential hypertension, benign    Glaucoma    Irritable bowel syndrome    Lateral epicondylitis  of elbow    Leukocytopenia, unspecified    Mastodynia    Obesity, unspecified    Pure hypercholesterolemia    Reflux esophagitis    Thoracic or lumbosacral neuritis or radiculitis, unspecified    Unspecified disorder of skin and subcutaneous tissue    Vaginal cancer ([place])    History    Past Surgical History:  Procedure Laterality Date   BREAST EXCISIONAL BIOPSY Left    neg   BREAST LUMPECTOMY Left    COLONOSCOPY  08/26/2006   COLONOSCOPY WITH PROPOFOL N/A 10/21/2016   Procedure: COLONOSCOPY WITH PROPOFOL;  Surgeon: Christene Lye, MD;  Location: [facility] ENDOSCOPY;  Service: Endoscopy;  Laterality: N/A;   ESOPHAGOGASTRODUODENOSCOPY (EGD) WITH PROPOFOL N/A 02/07/2020   Procedure: ESOPHAGOGASTRODUODENOSCOPY (EGD) WITH  PROPOFOL;  Surgeon: Lin Landsman, MD;  Location: [facility] ENDOSCOPY;  Service: Gastroenterology;  Laterality: N/A;    There were no vitals filed for this visit.   Subjective Assessment - 08/06/21 1107     Subjective Pt reports dizziness is currently a 2/10. She reports lightheadedness is currently greatest concern.    Pertinent History Pt is a 76 yo female presenting to PT with c/o of neck pain, HA and dizziness sx that started >1 year ago. Pt previously seen in PT for neck pain, reports it improved but never fully resolved. Pt has been diagnosed with a "pinched nerve." Prolonged sitting increases her sx which also includes wooziness. Bending increases wooziness. Current pain is 6-7/10. Pt denies any light sensitivty. Pt does have hx of vertigo but reports these symptoms are different. She has difficulty lying on her L side (same side as HA) and can exerpience a "light buzz" sensation. Pt also experiences ringing in her ears sometimes. hx of chronic sinus problems. No falls in the last 6 months. Movement improves sx. Other PMH significant allergic rhinitis, anxiety, HTN, glaucoma, IBS, pure hypercholesterolemia, thoracic or lumbosacral neuritis or radiculitis, vaginal CA, OA, GERD, osteopenia, meningioma, OSA    Limitations Sitting;Lifting;House hold activities   lifting/household activities limited with positions requiring bending   How long can you sit comfortably? Limited due to increased neck pain/HA. Reports unsure but maybe able  to sit for a max of a couple hours if she has been active throughout the day.    How long can you stand comfortably? Pt repots only limited if standing in the shower    How long can you walk comfortably? Pt reports not limited    Diagnostic tests MR BRAIN 04/20/2019:    IMPRESSION:  Right para falcine meningioma is stable from 1 year ago. No mass in  the brain.     Mild chronic microvascular ischemic changes in the white matter also  stable.  CT ANGIO HEAD 2021:  "IMPRESSION:  No large vessel occlusion, high-grade narrowing, dissection or  aneurysm.     No acute intracranial finding. 2.0 cm right para falcine meningioma  is unchanged." and CT ANGIO NECK 2021: "IMPRESSION:  No large vessel occlusion, high-grade narrowing, dissection or  aneurysm.     No acute intracranial finding. 2.0 cm right para falcine meningioma  is unchanged."    Patient Stated Goals Pt would like to be able to sit as long as she'd like    Currently in Pain? No/denies    Pain Onset More than a month ago              INTERVENTIONS:  Manual: Pt seated STM and TrP release provided to R posterior shoulder musculature and cervical paraspinals. Pt with TrPs in B scapular musculature. Pt additionally TTP over C7, PT provided 2 min of grade I-II CPA in approximately 20 sec bouts.    Therex-  Seated shrugs 10x Seated thoracic extension over chair followed by spinal flexion 10x VC/TC to improve movement in thoracic spine (pt continues to compensate with cervical ext).  Seated RTB shoulder ER B 15x  Neuro: Gait belt donned and PT providing CGA throughout unless otherwise specified: Body rolls against wall 3x6 Comments: Pt reports 1/10 dizziness, states, "I think I could walk. Just enough [dizziness] to notice."  Visual tracking with red ball bend and reach 10x; no increase in symptoms  Ambulating with EC forward and EO backward 6x length of 10 meters with CGA. Pt then progressed to performing activity with vertical and horizontal head turns for 2-4 rounds of each.  Obstacle course: navigating around cones, over hurdle and onto and off of airex pad 2x. Progressed to addition of dual cog task 4x. Pt difficulty navigating obstacles with dual cog task.     Pt educated throughout session about proper posture and technique with exercises. Improved exercise technique, movement at target joints, use of target muscles after min to mod verbal, visual, tactile cues.      PT Education -  08/06/21 1320     Education Details exercise technique, body mechanics    Person(s) Educated Patient    Methods Explanation;Demonstration;Verbal cues    Comprehension Returned demonstration;Verbalized understanding;Verbal cues required;Need further instruction              PT Short Term Goals - 06/24/21 1309       PT SHORT TERM GOAL #1   Title Pt will be independent with HEP in order to improve strength and balance in order to decrease fall risk and improve function at home and in the community    Baseline 10/27: issued seated VOR and cervical rotation stretch; 05/15/21: Pt reports compliance with HEP everyday except for Sundays and days she has PT    Time 6    Period Weeks    Status Achieved    Target Date 05/07/21  PT Long Term Goals - 06/24/21 1227       PT LONG TERM GOAL #1   Title Pt will decrease DHI score by at least 18 points in order to demonstrate clinically significant reduction in disability    Baseline 10/26: 22/100; 05/15/21: 14/100; 1/14: 10/100    Time 12    Period Weeks    Status On-going    Target Date 09/16/21      PT LONG TERM GOAL #2   Title Patient will increase FOTO score by at least 10 points to demonstrate improvement in mobility and quality of life.    Baseline 10/26: 65, 05/15/21: 60%; 1/24: deferred to next session, unable to assess due to time    Time 12    Period Weeks    Status On-going    Target Date 09/16/21      PT LONG TERM GOAL #3   Title Pt will improve DGI by at least 2 points in order to demonstrate improvement in balance and decreased risk for falls.    Baseline 10/26: to be tested in next 1-2 sessions; 11/2: 22/24, 05/15/21: 23/24; 1/24: deferred to next visit    Time 12    Period Weeks    Status On-going    Target Date 09/16/21      PT LONG TERM GOAL #4   Title Pt will report a worst neck pain of no greater than 3/10 within the past two weeks in order to improve QOL.    Baseline 10/27: pain currently  6-7/10. 05/15/21: no current pain, rated 4/10 highest in the last 2 weeks; 1/24: pt reports pain briefly achieved 10/10 over the past week, but has been averaging 7/10.    Time 12    Period Weeks    Status On-going    Target Date 09/16/21      PT LONG TERM GOAL #5   Title Pt will report ability to bend down/over to complete yard work/ADLs without wooziness or decreased postural stability.    Baseline 10/26: Pt reports increased wooziness with bending and FOF/unsteadiness; 05/15/21: Pt reports improving wooziness with bending but is still present.; 1/24: pt reports increased wooziness with bending to 5/10    Time 12    Period Weeks    Status On-going    Target Date 09/16/21                   Plan - 08/06/21 1321     Clinical Impression Statement Pt continues to exhibit progress with reporting overall improvement with dizziness/wooziness outside of PT. Pt tolerated interventions well without dizziness increasing above baseline levels. She showed mild unsteadiness with ambulating backward with vertical head turns, but required no greater than CGA. Pt also with some difficulty navigating around cones in obstacle course with addition of dual task, knocking into cones. The pt will benefit from further skilled PT to continue to improve balance, pain, and strength in order to decrease fall risk.    Personal Factors and Comorbidities Age;Past/Current Experience;Sex;Comorbidity 3+;Time since onset of injury/illness/exacerbation    Comorbidities PMH significant for allergic rhinitis, anxiety, HTN, glaucoma, IBS, pure hypercholesterolemia, thoracic or lumbosacral neuritis or radiculitis, vaginal CA, OA, GERD, osteopenia, meningioma, OSA    Examination-Activity Limitations Bathing;Bed Mobility;Bend;Stairs;Sit;Sleep;Locomotion Level;Hygiene/Grooming    Examination-Participation Restrictions Occupation;Yard Work;Cleaning;Community Activity;Shop    Stability/Clinical Decision Making Evolving/Moderate  complexity    Rehab Potential Good    PT Frequency 2x / week    PT Duration 12 weeks    PT Treatment/Interventions  ADLs/Self Care Home Management;Biofeedback;Canalith Repostioning;Cryotherapy;Electrical Stimulation;Moist Heat;Traction;Ultrasound;DME Instruction;Gait training;Stair training;Functional mobility training;Therapeutic activities;Therapeutic exercise;Balance training;Neuromuscular re-education;Patient/family education;Manual techniques;Dry needling;Passive range of motion;Energy conservation;Splinting;Taping;Vestibular;Visual/perceptual remediation/compensation;Joint Manipulations    PT Next Visit Plan orthostatics, DGI, 10MWT, gaze stabilization, balance interventions, manual therapy and therex to address cervical mobility, shoulder strengthening; continue POC as previously indicated    PT Home Exercise Plan Access Code: [email or web address], 11/2: increase performance of seated VOR to 5 min (performed in 30 sec bouts); 11/8: Access Code: [email or web address]; 11/16: Access Code: [email or web address]; no updates; 1/18: ?Access Code: [email or web address]; 1/24: educated pt to reduce VOR interventions to 5 min for pain modulation of neck and to discontinue should she experience increased neck pain, education on performing all HEP within pain-free range and increasing time ambulated by 5 min.; no updates    Consulted and Agree with Plan of Care Patient             Patient will benefit from skilled therapeutic intervention in order to improve the following deficits and impairments:  Pain, Dizziness, Decreased range of motion, Hypomobility, Improper body mechanics, Decreased activity tolerance, Decreased balance, Decreased mobility, Increased muscle spasms, Impaired flexibility, Impaired vision/preception, Postural dysfunction  Visit Diagnosis: Dizziness and giddiness  Unsteadiness on feet  Other abnormalities of gait and mobility     Problem List Patient Active Problem List   Diagnosis Date Noted   Pulsatile tinnitus of right  ear 06/18/2020   Pulsatile neck mass 06/18/2020   OSA (obstructive sleep apnea) 09/19/2019   Anxiety and depression 09/19/2019   Left thyroid nodule 08/08/2019   Neck pain 01/03/2019   Meningioma ([place]) 18/29/9371   Lichen sclerosus 69/67/8938   Vaginal atrophy 03/18/2016   Encounter for follow-up surveillance of vaginal cancer 03/18/2016   Eczema 10/31/2015   Left hand pain 09/10/2015   Osteopenia after menopause 07/26/2015   Menopause 07/26/2015   Irritable colon 07/26/2015   History of cancer of vagina 07/26/2015   Diffuse cystic mastopathy 07/26/2015   Obesity (BMI 30.0-34.9) 07/26/2015   Intermittent low back pain 07/26/2015   Hyperlipemia 07/26/2015   Osteoarthritis 05/13/2015   Allergic rhinitis 05/13/2015   Chronic GERD 05/13/2015   Essential hypertension, benign     Zollie Pee, PT 08/06/2021, 1:32 PM  [place] MAIN [facility] SERVICES [address] [place], [place], [address] Phone: [phone]   Fax:  [phone]  Name: Kassidee Narciso MRN: [id number] Date of Birth: Jan 24, 1946

## 2021-08-08 ENCOUNTER — Ambulatory Visit: Payer: Medicare PPO

## 2021-08-13 ENCOUNTER — Ambulatory Visit: Payer: Medicare PPO

## 2021-08-13 ENCOUNTER — Other Ambulatory Visit: Payer: Self-pay

## 2021-08-13 DIAGNOSIS — R42 Dizziness and giddiness: Secondary | ICD-10-CM | POA: Diagnosis not present

## 2021-08-13 DIAGNOSIS — R2681 Unsteadiness on feet: Secondary | ICD-10-CM | POA: Diagnosis not present

## 2021-08-13 DIAGNOSIS — M542 Cervicalgia: Secondary | ICD-10-CM | POA: Diagnosis not present

## 2021-08-13 DIAGNOSIS — M6281 Muscle weakness (generalized): Secondary | ICD-10-CM | POA: Diagnosis not present

## 2021-08-13 DIAGNOSIS — R2689 Other abnormalities of gait and mobility: Secondary | ICD-10-CM | POA: Diagnosis not present

## 2021-08-13 NOTE — Therapy (Signed)
[place] [facility] [facility] ?[place], [place], [address] ?Phone: [phone]   Fax:  [phone] ? ?Physical Therapy Treatment ? ?Patient Details  ?Name: Vickie Stewart ?MRN: [id number] ?Date of Birth: Oct 15, 1945 ?Referring Provider (PT): Vladimir Crofts, MD ? ? ?Encounter Date: 08/13/2021 ? ? PT End of Session - 08/14/21 0807   ? ? Visit Number 18   ? Number of Visits 25   ? Date for PT Re-Evaluation 09/16/21   corrected to reflecct recert  ? Authorization Type eval completed 03/26/2021   ? PT Start Time 1102   ? PT Stop Time 1145   ? PT Time Calculation (min) 43 min   ? Equipment Utilized During Treatment Gait belt   ? Activity Tolerance Patient tolerated treatment well   ? Behavior During Therapy [facility] for tasks assessed/performed   ? ?  ?  ? ?  ? ? ?Past Medical History:  ?Diagnosis Date  ? Allergic rhinitis, cause unspecified   ? Anxiety state, unspecified   ? Contact dermatitis and other eczema, due to unspecified cause   ? Diffuse cystic mastopathy   ? Dyspepsia and other specified disorders of function of stomach   ? Essential hypertension, benign   ? Glaucoma   ? Irritable bowel syndrome   ? Lateral epicondylitis  of elbow   ? Leukocytopenia, unspecified   ? Mastodynia   ? Obesity, unspecified   ? Pure hypercholesterolemia   ? Reflux esophagitis   ? Thoracic or lumbosacral neuritis or radiculitis, unspecified   ? Unspecified disorder of skin and subcutaneous tissue   ? Vaginal cancer ([place])   ? History  ? ? ?Past Surgical History:  ?Procedure Laterality Date  ? BREAST EXCISIONAL BIOPSY Left   ? neg  ? BREAST LUMPECTOMY Left   ? COLONOSCOPY  08/26/2006  ? COLONOSCOPY WITH PROPOFOL N/A 10/21/2016  ? Procedure: COLONOSCOPY WITH PROPOFOL;  Surgeon: Christene Lye, MD;  Location: [facility] ENDOSCOPY;  Service: Endoscopy;  Laterality: N/A;  ? ESOPHAGOGASTRODUODENOSCOPY (EGD) WITH PROPOFOL N/A 02/07/2020  ? Procedure: ESOPHAGOGASTRODUODENOSCOPY (EGD) WITH  PROPOFOL;  Surgeon: Lin Landsman, MD;  Location: [facility] ENDOSCOPY;  Service: Gastroenterology;  Laterality: N/A;  ? ? ?There were no vitals filed for this visit. ? ? Subjective Assessment - 08/13/21 1106   ? ? Subjective Pt reports improvement with dizziness and reports no dizziness currently. Pt reports her R wrist is aching. Pt feels she still walks a little slower than she used to due to her balance.   ? Pertinent History Pt is a 76 yo female presenting to PT with c/o of neck pain, HA and dizziness sx that started >1 year ago. Pt previously seen in PT for neck pain, reports it improved but never fully resolved. Pt has been diagnosed with a "pinched nerve." Prolonged sitting increases her sx which also includes wooziness. Bending increases wooziness. Current pain is 6-7/10. Pt denies any light sensitivty. Pt does have hx of vertigo but reports these symptoms are different. She has difficulty lying on her L side (same side as HA) and can exerpience a "light buzz" sensation. Pt also experiences ringing in her ears sometimes. hx of chronic sinus problems. No falls in the last 6 months. Movement improves sx. Other PMH significant allergic rhinitis, anxiety, HTN, glaucoma, IBS, pure hypercholesterolemia, thoracic or lumbosacral neuritis or radiculitis, vaginal CA, OA, GERD, osteopenia, meningioma, OSA   ? Limitations Sitting;Lifting;House hold activities   lifting/household activities limited with positions requiring bending  ?  How long can you sit comfortably? Limited due to increased neck pain/HA. Reports unsure but maybe able to sit for a max of a couple hours if she has been active throughout the day.   ? How long can you stand comfortably? Pt repots only limited if standing in the shower   ? How long can you walk comfortably? Pt reports not limited   ? Diagnostic tests MR BRAIN 04/20/2019:    IMPRESSION:  Right para falcine meningioma is stable from 1 year ago. No mass in  the brain.     Mild chronic  microvascular ischemic changes in the white matter also  stable.  CT ANGIO HEAD 2021: "IMPRESSION:  No large vessel occlusion, high-grade narrowing, dissection or  aneurysm.     No acute intracranial finding. 2.0 cm right para falcine meningioma  is unchanged." and CT ANGIO NECK 2021: "IMPRESSION:  No large vessel occlusion, high-grade narrowing, dissection or  aneurysm.     No acute intracranial finding. 2.0 cm right para falcine meningioma  is unchanged."   ? Patient Stated Goals Pt would like to be able to sit as long as she'd like   ? Currently in Pain? Yes   ? Pain Location Wrist   ? Pain Orientation Right   ? Pain Descriptors / Indicators Aching   ? Pain Onset More than a month ago   ? ?  ?  ? ?  ? ? ? ?  ?INTERVENTIONS: ?  ?Manual: Pt seated ?Assessed B shoulder musclature, cervical parapsinals. Pt still TTP over C7, but reports no other tenderness throughout. PT briefly attempts CPA over C7, grade I-II, however discontinued as pt reported slight increase in sensitivity over region.  ? ?Therex- ?Seated shrugs 10x ?Seated thoracic extension over chair 10x VC/TC to improve movement in thoracic spine. Pt performs with chin tucked ?Scapular squeezes 10x ?P.ball forward rollouts 10x with chin tuck ?Spinal extension and shoulder flexion stretch against wall 2x30 sec ?Standing back ext against wall 3x10-15 sec ? ?Neuro: gait belt donned and CGA provided throughout unless otherwise specified  ?Body rolls against wall 4 rounds ?Comments: Pt reports 1/10 dizziness with last two sets. No initial dizziness. ? ?Ambulating with EC x multiple rounds over 10 meter track. No dizziness. Slight deviation from straight path. No LOB. When cued to decrease speed noted increase in instability.  ? ?Tandem walk 2x10 meters. CGA-min a provided.  ? ?Slow marching to promote SLB 2x10 meter track. Pt provides up to min a. ? ?Staris/ascending descending with UUE to no UE support and close CGA. Encourage pt for recip steps each directoin.  Rates medium x multiple reps ? ?EC on airex NBOS 2x60 sec. Increase in sway but no LOB. ? ?Slow march on airex pad 20x ? ?WBOS on airex with vertical head turns 10x  ? ?PT issued additional handout to pt of seated thoracic ext. Over chair. Due to pt reporting aching of R wrist, instructed pt not to use banded exercises with RUE. Pt verbalized understanding.  ? ?Pt educated throughout session about proper posture and technique with exercises. Improved exercise technique, movement at target joints, use of target muscles after min to mod verbal, visual, tactile cues. ? PT Education - 08/14/21 0806   ? ? Education Details exercise technique   ? Person(s) Educated Patient   ? Methods Explanation;Demonstration;Tactile cues;Verbal cues;Handout   ? Comprehension Verbalized understanding;Returned demonstration;Verbal cues required;Need further instruction   ? ?  ?  ? ?  ? ? ? PT  Short Term Goals - 06/24/21 1309   ? ?  ? PT SHORT TERM GOAL #1  ? Title Pt will be independent with HEP in order to improve strength and balance in order to decrease fall risk and improve function at home and in the community   ? Baseline 10/27: issued seated VOR and cervical rotation stretch; 05/15/21: Pt reports compliance with HEP everyday except for Sundays and days she has PT   ? Time 6   ? Period Weeks   ? Status Achieved   ? Target Date 05/07/21   ? ?  ?  ? ?  ? ? ? ? PT Long Term Goals - 06/24/21 1227   ? ?  ? PT LONG TERM GOAL #1  ? Title Pt will decrease DHI score by at least 18 points in order to demonstrate clinically significant reduction in disability   ? Baseline 10/26: 22/100; 05/15/21: 14/100; 1/14: 10/100   ? Time 12   ? Period Weeks   ? Status On-going   ? Target Date 09/16/21   ?  ? PT LONG TERM GOAL #2  ? Title Patient will increase FOTO score by at least 10 points to demonstrate improvement in mobility and quality of life.   ? Baseline 10/26: 65, 05/15/21: 60%; 1/24: deferred to next session, unable to assess due to time   ?  Time 12   ? Period Weeks   ? Status On-going   ? Target Date 09/16/21   ?  ? PT LONG TERM GOAL #3  ? Title Pt will improve DGI by at least 2 points in order to demonstrate improvement in balance and decreased risk

## 2021-08-20 ENCOUNTER — Ambulatory Visit: Payer: Medicare PPO

## 2021-08-20 ENCOUNTER — Other Ambulatory Visit: Payer: Self-pay

## 2021-08-20 DIAGNOSIS — R42 Dizziness and giddiness: Secondary | ICD-10-CM | POA: Diagnosis not present

## 2021-08-20 DIAGNOSIS — R2681 Unsteadiness on feet: Secondary | ICD-10-CM | POA: Diagnosis not present

## 2021-08-20 DIAGNOSIS — M6281 Muscle weakness (generalized): Secondary | ICD-10-CM | POA: Diagnosis not present

## 2021-08-20 DIAGNOSIS — M542 Cervicalgia: Secondary | ICD-10-CM | POA: Diagnosis not present

## 2021-08-20 DIAGNOSIS — R2689 Other abnormalities of gait and mobility: Secondary | ICD-10-CM | POA: Diagnosis not present

## 2021-08-20 NOTE — Therapy (Signed)
[facility] ?[place], [place], [address] ?Phone: [phone]   Fax:  [phone] ? ?Physical Therapy Treatment ? ?Patient Details  ?Name: Vickie Stewart ?MRN: [id number] ?Date of Birth: 13-Nov-1945 ?Referring Provider (PT): Vladimir Crofts, MD ? ? ?Encounter Date: 08/20/2021 ? ? PT End of Session - 08/20/21 1638   ? ? Visit Number 19   ? Number of Visits 25   ? Date for PT Re-Evaluation 09/16/21   corrected to reflecct recert  ? Authorization Type eval completed 03/26/2021   ? PT Start Time 1149   ? PT Stop Time 7824   ? PT Time Calculation (min) 40 min   ? Equipment Utilized During Treatment Gait belt   ? Activity Tolerance Patient tolerated treatment well   ? Behavior During Therapy [facility] for tasks assessed/performed   ? ?  ?  ? ?  ? ? ?Past Medical History:  ?Diagnosis Date  ? Allergic rhinitis, cause unspecified   ? Anxiety state, unspecified   ? Contact dermatitis and other eczema, due to unspecified cause   ? Diffuse cystic mastopathy   ? Dyspepsia and other specified disorders of function of stomach   ? Essential hypertension, benign   ? Glaucoma   ? Irritable bowel syndrome   ? Lateral epicondylitis  of elbow   ? Leukocytopenia, unspecified   ? Mastodynia   ? Obesity, unspecified   ? Pure hypercholesterolemia   ? Reflux esophagitis   ? Thoracic or lumbosacral neuritis or radiculitis, unspecified   ? Unspecified disorder of skin and subcutaneous tissue   ? Vaginal cancer ([place])   ? History  ? ? ?Past Surgical History:  ?Procedure Laterality Date  ? BREAST EXCISIONAL BIOPSY Left   ? neg  ? BREAST LUMPECTOMY Left   ? COLONOSCOPY  08/26/2006  ? COLONOSCOPY WITH PROPOFOL N/A 10/21/2016  ? Procedure: COLONOSCOPY WITH PROPOFOL;  Surgeon: Christene Lye, MD;  [facility]: [facility] ENDOSCOPY;  Service: Endoscopy;  Laterality: N/A;  ? ESOPHAGOGASTRODUODENOSCOPY (EGD) WITH PROPOFOL N/A 02/07/2020  ? Procedure: ESOPHAGOGASTRODUODENOSCOPY (EGD) WITH  PROPOFOL;  Surgeon: Lin Landsman, MD;  Location: [facility] ENDOSCOPY;  Service: Gastroenterology;  Laterality: N/A;  ? ? ?There were no vitals filed for this visit. ? ? Subjective Assessment - 08/20/21 1151   ? ? Subjective Pt reports her dizziness is better, she reports soreness in the back of her head is "a whole lot better." Pt reports no dizziness currenlty. She reports sometimes she has ligthheadedness when standing. Pt reports this is happening less often. Pt reports some soreness/pain in B feet due to increased walking recently.   ? Pertinent History Pt is a 76 yo female presenting to PT with c/o of neck pain, HA and dizziness sx that started >1 year ago. Pt previously seen in PT for neck pain, reports it improved but never fully resolved. Pt has been diagnosed with a "pinched nerve." Prolonged sitting increases her sx which also includes wooziness. Bending increases wooziness. Current pain is 6-7/10. Pt denies any light sensitivty. Pt does have hx of vertigo but reports these symptoms are different. She has difficulty lying on her L side (same side as HA) and can exerpience a "light buzz" sensation. Pt also experiences ringing in her ears sometimes. hx of chronic sinus problems. No falls in the last 6 months. Movement improves sx. Other PMH significant allergic rhinitis, anxiety, HTN, glaucoma, IBS, pure hypercholesterolemia, thoracic or lumbosacral neuritis or radiculitis, vaginal CA, OA, GERD,  osteopenia, meningioma, OSA   ? Limitations Sitting;Lifting;House hold activities   lifting/household activities limited with positions requiring bending  ? How long can you sit comfortably? Limited due to increased neck pain/HA. Reports unsure but maybe able to sit for a max of a couple hours if she has been active throughout the day.   ? How long can you stand comfortably? Pt repots only limited if standing in the shower   ? How long can you walk comfortably? Pt reports not limited   ? Diagnostic tests MR BRAIN  04/20/2019:    IMPRESSION:  Right para falcine meningioma is stable from 1 year ago. No mass in  the brain.     Mild chronic microvascular ischemic changes in the white matter also  stable.  CT ANGIO HEAD 2021: "IMPRESSION:  No large vessel occlusion, high-grade narrowing, dissection or  aneurysm.     No acute intracranial finding. 2.0 cm right para falcine meningioma  is unchanged." and CT ANGIO NECK 2021: "IMPRESSION:  No large vessel occlusion, high-grade narrowing, dissection or  aneurysm.     No acute intracranial finding. 2.0 cm right para falcine meningioma  is unchanged."   ? Patient Stated Goals Pt would like to be able to sit as long as she'd like   ? Currently in Pain? Yes   ? Pain Location Foot   ? Pain Orientation Right;Left   ? Pain Type Acute pain   ? Pain Onset More than a month ago   ? ?  ?  ? ?  ? ? ? ?  ?INTERVENTIONS: ?   ?Neuro: gait belt donned and CGA provided throughout unless otherwise specified  ?SLB 4x30-60 sec each LE ?Foot on floor, and one foot on airex with EC 30 sec each LE: rates easy  ?SLB with EC 45 sec each LE; intermittent UE support and to-touch support throughout ?Slow marching on airex 20x, completes without UE support ? ?Body rolls against wall x 6 rounds ?Comments: Pt reports 1-1.5/10 dizziness, pt says, "just enough to notice." Pt reports improvement to 0/10 in less than 30 sec.   ?  ?Tandem gait 4x10 meters. CGA-min a provided.  ? ?Ambulating EC 4x10 meters - CGA ? ?Ambulating with diagonals B 4x10 meters, CGA, slight unsteadiness ? ?Pt educated throughout session about proper posture and technique with exercises. Improved exercise technique, movement at target joints, use of target muscles after min to mod verbal, visual, tactile cues. ? ? ? ? PT Short Term Goals - 06/24/21 1309   ? ?  ? PT SHORT TERM GOAL #1  ? Title Pt will be independent with HEP in order to improve strength and balance in order to decrease fall risk and improve function at home and in the community    ? Baseline 10/27: issued seated VOR and cervical rotation stretch; 05/15/21: Pt reports compliance with HEP everyday except for Sundays and days she has PT   ? Time 6   ? Period Weeks   ? Status Achieved   ? Target Date 05/07/21   ? ?  ?  ? ?  ? ? ? ? PT Long Term Goals - 06/24/21 1227   ? ?  ? PT LONG TERM GOAL #1  ? Title Pt will decrease DHI score by at least 18 points in order to demonstrate clinically significant reduction in disability   ? Baseline 10/26: 22/100; 05/15/21: 14/100; 1/14: 10/100   ? Time 12   ? Period Weeks   ? Status  On-going   ? Target Date 09/16/21   ?  ? PT LONG TERM GOAL #2  ? Title Patient will increase FOTO score by at least 10 points to demonstrate improvement in mobility and quality of life.   ? Baseline 10/26: 65, 05/15/21: 60%; 1/24: deferred to next session, unable to assess due to time   ? Time 12   ? Period Weeks   ? Status On-going   ? Target Date 09/16/21   ?  ? PT LONG TERM GOAL #3  ? Title Pt will improve DGI by at least 2 points in order to demonstrate improvement in balance and decreased risk for falls.   ? Baseline 10/26: to be tested in next 1-2 sessions; 11/2: 22/24, 05/15/21: 23/24; 1/24: deferred to next visit   ? Time 12   ? Period Weeks   ? Status On-going   ? Target Date 09/16/21   ?  ? PT LONG TERM GOAL #4  ? Title Pt will report a worst neck pain of no greater than 3/10 within the past two weeks in order to improve QOL.   ? Baseline 10/27: pain currently 6-7/10. 05/15/21: no current pain, rated 4/10 highest in the last 2 weeks; 1/24: pt reports pain briefly achieved 10/10 over the past week, but has been averaging 7/10.   ? Time 12   ? Period Weeks   ? Status On-going   ? Target Date 09/16/21   ?  ? PT LONG TERM GOAL #5  ? Title Pt will report ability to bend down/over to complete yard work/ADLs without wooziness or decreased postural stability.   ? Baseline 10/26: Pt reports increased wooziness with bending and FOF/unsteadiness; 05/15/21: Pt reports improving  wooziness with bending but is still present.; 1/24: pt reports increased wooziness with bending to 5/10   ? Time 12   ? Period Weeks   ? Status On-going   ? Target Date 09/16/21   ? ?  ?  ? ?  ? ? ? ? ? ? ? ?

## 2021-08-27 ENCOUNTER — Other Ambulatory Visit: Payer: Self-pay

## 2021-08-27 ENCOUNTER — Ambulatory Visit: Payer: Medicare PPO

## 2021-08-27 DIAGNOSIS — R2681 Unsteadiness on feet: Secondary | ICD-10-CM | POA: Diagnosis not present

## 2021-08-27 DIAGNOSIS — R2689 Other abnormalities of gait and mobility: Secondary | ICD-10-CM | POA: Diagnosis not present

## 2021-08-27 DIAGNOSIS — M542 Cervicalgia: Secondary | ICD-10-CM | POA: Diagnosis not present

## 2021-08-27 DIAGNOSIS — R42 Dizziness and giddiness: Secondary | ICD-10-CM | POA: Diagnosis not present

## 2021-08-27 DIAGNOSIS — M6281 Muscle weakness (generalized): Secondary | ICD-10-CM | POA: Diagnosis not present

## 2021-08-27 NOTE — Therapy (Signed)
[facility] ?[place], [place], [address] ?Phone: [phone]   Fax:  [phone] ? ?Physical Therapy Treatment/Physical Therapy Progress Note/DISCHARGE SUMMARY ? ? ?Dates of reporting period  05/15/2021   to   08/27/2021 ? ? ?Patient Details  ?Name: Vickie Stewart ?MRN: [id number] ?Date of Birth: December 05, 1945 ?Referring Provider (PT): Vladimir Crofts, MD ? ? ?Encounter Date: 08/27/2021 ? ? PT End of Session - 08/27/21 1323   ? ? Visit Number 20   ? Number of Visits 25   ? Date for PT Re-Evaluation 09/16/21   corrected to reflecct recert  ? Authorization Type eval completed 03/26/2021   ? PT Start Time 1148   ? PT Stop Time 1230   ? PT Time Calculation (min) 42 min   ? Equipment Utilized During Treatment Gait belt   ? Activity Tolerance Patient tolerated treatment well   ? Behavior During Therapy [facility] for tasks assessed/performed   ? ?  ?  ? ?  ? ? ?Past Medical History:  ?Diagnosis Date  ? Allergic rhinitis, cause unspecified   ? Anxiety state, unspecified   ? Contact dermatitis and other eczema, due to unspecified cause   ? Diffuse cystic mastopathy   ? Dyspepsia and other specified disorders of function of stomach   ? Essential hypertension, benign   ? Glaucoma   ? Irritable bowel syndrome   ? Lateral epicondylitis  of elbow   ? Leukocytopenia, unspecified   ? Mastodynia   ? Obesity, unspecified   ? Pure hypercholesterolemia   ? Reflux esophagitis   ? Thoracic or lumbosacral neuritis or radiculitis, unspecified   ? Unspecified disorder of skin and subcutaneous tissue   ? Vaginal cancer ([place])   ? History  ? ? ?Past Surgical History:  ?Procedure Laterality Date  ? BREAST EXCISIONAL BIOPSY Left   ? neg  ? BREAST LUMPECTOMY Left   ? COLONOSCOPY  08/26/2006  ? COLONOSCOPY WITH PROPOFOL N/A 10/21/2016  ? Procedure: COLONOSCOPY WITH PROPOFOL;  Surgeon: Christene Lye, MD;  Location: [facility] ENDOSCOPY;  Service: Endoscopy;  Laterality: N/A;  ?  ESOPHAGOGASTRODUODENOSCOPY (EGD) WITH PROPOFOL N/A 02/07/2020  ? Procedure: ESOPHAGOGASTRODUODENOSCOPY (EGD) WITH PROPOFOL;  Surgeon: Lin Landsman, MD;  Location: [facility] ENDOSCOPY;  Service: Gastroenterology;  Laterality: N/A;  ? ? ?There were no vitals filed for this visit. ? ? Subjective Assessment - 08/27/21 1150   ? ? Subjective Pt reports continued L foot pain following walking the other week. Pt reports pain only with walking but not present at rest. Pt reports she has not had any dizziness/wooziness recently. She reports occasional lightheadedness.   ? Pertinent History Pt is a 76 yo female presenting to PT with c/o of neck pain, HA and dizziness sx that started >1 year ago. Pt previously seen in PT for neck pain, reports it improved but never fully resolved. Pt has been diagnosed with a "pinched nerve." Prolonged sitting increases her sx which also includes wooziness. Bending increases wooziness. Current pain is 6-7/10. Pt denies any light sensitivty. Pt does have hx of vertigo but reports these symptoms are different. She has difficulty lying on her L side (same side as HA) and can exerpience a "light buzz" sensation. Pt also experiences ringing in her ears sometimes. hx of chronic sinus problems. No falls in the last 6 months. Movement improves sx. Other PMH significant allergic rhinitis, anxiety, HTN, glaucoma, IBS, pure hypercholesterolemia, thoracic or lumbosacral neuritis or radiculitis, vaginal CA,  OA, GERD, osteopenia, meningioma, OSA   ? Limitations Sitting;Lifting;House hold activities   lifting/household activities limited with positions requiring bending  ? How long can you sit comfortably? Limited due to increased neck pain/HA. Reports unsure but maybe able to sit for a max of a couple hours if she has been active throughout the day.   ? How long can you stand comfortably? Pt repots only limited if standing in the shower   ? How long can you walk comfortably? Pt reports not limited   ?  Diagnostic tests MR BRAIN 04/20/2019:    IMPRESSION:  Right para falcine meningioma is stable from 1 year ago. No mass in  the brain.     Mild chronic microvascular ischemic changes in the white matter also  stable.  CT ANGIO HEAD 2021: "IMPRESSION:  No large vessel occlusion, high-grade narrowing, dissection or  aneurysm.     No acute intracranial finding. 2.0 cm right para falcine meningioma  is unchanged." and CT ANGIO NECK 2021: "IMPRESSION:  No large vessel occlusion, high-grade narrowing, dissection or  aneurysm.     No acute intracranial finding. 2.0 cm right para falcine meningioma  is unchanged."   ? Patient Stated Goals Pt would like to be able to sit as long as she'd like   ? Currently in Pain? Yes   ? Pain Score 6    ? Pain Location Foot   ? Pain Orientation Left   ? Pain Onset More than a month ago   ? ?  ?  ? ?  ? ? ? ? ? [facility] PT Assessment - 08/27/21 0001   ? ?  ? Standardized Balance Assessment  ? Standardized Balance Assessment Dynamic Gait Index   ?  ? Dynamic Gait Index  ? Level Surface Normal   ? Change in Gait Speed Normal   ? Gait with Horizontal Head Turns Normal   ? Gait with Vertical Head Turns Normal   ? Gait and Pivot Turn Normal   ? Step Over Obstacle Normal   ? Step Around Obstacles Normal   ? Steps Mild Impairment   ? Total Score 23   ? ?  ?  ? ?  ? ? ?INTERVENTIONS - goals reviewed for progress note/discharge. See goal section for details of testing completed. ? ?Other interventions- ? ?PT instructs pt through activity recommendations and how to use LE strengthening equipment such as leg press and nustep to continue gains beyond PT. PT instructs pt to perform any intervention within a pain-free range. If pt has increased foot pain, PT instructs pt to discontinue exercise for now and follow-up with her physician.  ? ?Nustep level 3  x 4 min  - pain free ? ?Instructed pt in use of leg press (pain free) 10x reps level 1 ? ?Instructed pt in handout with exercises to continue.  ? ?Pt  agreeable to plan and verbalized understanding for all.  ? ?Assessment: Goals reviewed for progress note and discharge as pt continues to report no remaining dizziness/wooziness. Pt has achieved 1 goal with 0/10 neck pain, and has partially met 3 remaining goals. Only goal pt did not achieve was her FOTO goal, however, pt with high FOTO score. PT instructed pt in how to maintain and continue gains outside of therapy and that pt could return with new referral should her dizziness become a problem again in the future. The pt does not require further skilled vestibular PT at this time as her dizziness has resolved and  is to be discharged on this date. Pt agreeable to plan.  ? ?Pt educated throughout session about proper posture and technique with exercises. Improved exercise technique, movement at target joints, use of target muscles after min to mod verbal, visual, tactile cues. ? ? ? PT Education - 08/27/21 1322   ? ? Education Details exercise technique, d/c recommendations including HEP   ? Person(s) Educated Patient   ? Methods Explanation;Demonstration;Handout;Verbal cues   ? Comprehension Verbalized understanding;Returned demonstration   ? ?  ?  ? ?  ? ? ? PT Short Term Goals - 08/27/21 1331   ? ?  ? PT SHORT TERM GOAL #1  ? Title Pt will be independent with HEP in order to improve strength and balance in order to decrease fall risk and improve function at home and in the community   ? Baseline 10/27: issued seated VOR and cervical rotation stretch; 05/15/21: Pt reports compliance with HEP everyday except for Sundays and days she has PT   ? Time 6   ? Period Weeks   ? Status Achieved   ? Target Date 05/07/21   ? ?  ?  ? ?  ? ? ? ? PT Long Term Goals - 08/27/21 1153   ? ?  ? PT LONG TERM GOAL #1  ? Title Pt will decrease DHI score by at least 18 points in order to demonstrate clinically significant reduction in disability   ? Baseline 10/26: 22/100; 05/15/21: 14/100; 1/14: 10/100 3/29 8/100   ? Time 12   ? Period  Weeks   ? Status Partially Met   ? Target Date 09/16/21   ?  ? PT LONG TERM GOAL #2  ? Title Patient will increase FOTO score by at least 10 points to demonstrate improvement in mobility and quality of life.   ? Clarise Cruz

## 2021-09-02 NOTE — Progress Notes (Signed)
Name: Vickie Stewart   MRN: [id number]    DOB: 08-Dec-1945   Date:09/03/2021 ? ?     Progress Note ? ?Subjective ? ?Chief Complaint ? ?Follow Up ? ?HPI ? ?Patient presents for annual CPE and follow up - patient requested combo visit and is aware of possible additional cost  ? ?HTN: BP is at goal, no chest pain, palpitation or sob. She is compliant with medications but only taking half dose of Benicar 40 mg because bp was dropped.  ?  ?Meningioma: still under the care of Dr. Lacinda Axon, last visit was Nov 2020  and had repeat MRI 11/22 that showed very mild change. No neuro deficit  ? ?Recent fall: she fell from a ladder at home March 2023 fracture right wrist, had cast followed by brace, however continues to have pain and feels weak on right hand. She has been released from ortho and is taking celebrex, advised to try weaning self off celebrex and take tylenol instead  ?  ?GERD: she is taking omeprazole 20 mg  once or twice daily  she states symptom are controlled.  ?  ?Thyroid nodule: seen by Dr. Gabriel Carina in 2015 no dysphagia, she went back 04/2020 and repeat US just showed a benign cyst left lower lobe. Right side of the neck used to puff up when she was coughing.  She had normal EGD done 01/2020, she states she was sent to vascular surgeon and studies were negative - I cannot see results Unchanged. No recent episodes of puffiness on her neck  ?  ?Hyperlipidemia: she takes Crestor, denies myalgias. Also taking fish oil. Last LDL was 77. We will recheck labs today  ?  ?Dysthymia : she states she is feeling better, phq 9 is negative, she has been leaving her house. More motivated  ?  ?OSA: she has been wearing CPAP every night and is feeling better, wakes up and has to place mask back on. Continue  CPAP  ?  ?History of vaginal cancer, s/p brachytherapy done at [facility] , last pap smear was done 2021 and normal ,she denies vaginal odor. We will repeat pap smear today  ?  ?Pre-diabetes: last A1C was down to 5.7 % denies  polyphagia, polydipsia or polyuria ?  ?Left occipital neuralgia/Dizziness: seen by Dr. Manuella Ghazi, ordered labs, MRI and NCS, and is doing much better with vestibular rehab.  ? ?Plantar fascitis: only on left side, she was walking daily, discussed insoles, ice pack and rolling Gillermina Phy  ? ?Diet: balanced, cutting down on sweets  ?Exercise: continue current regular physical activity   ? ?[place] Office Visit from 09/03/2021 in [facility]  ?AUDIT-C Score 0  ? ?  ? ?Depression: Phq 9 is  negative ? ?  09/03/2021  ?  1:52 PM 03/05/2021  ? 11:28 AM 08/13/2020  ? 10:53 AM 04/29/2020  ? 11:14 AM 01/26/2020  ?  1:16 PM  ?Depression screen PHQ 2/9  ?Decreased Interest 0 1 0 1 0  ?Down, Depressed, Hopeless 0 0 0 1 0  ?PHQ - 2 Score 0 1 0 2 0  ?Altered sleeping 0 0  1 1  ?Tired, decreased energy 0 0  1 1  ?Change in appetite 0 0  0 1  ?Feeling bad or failure about yourself  0 0  0 0  ?Trouble concentrating 0 0  1 1  ?Moving slowly or fidgety/restless 0 0  0 0  ?Suicidal thoughts 0 0  0 0  ?PHQ-9 Score 0  _0 ?Difficult doing work/chores    Not difficult at all Not difficult at all  ? ?Hypertension: ?BP Readings from Last 3 Encounters:  ?09/03/21 122/72  ?03/05/21 120/74  ?08/13/20 130/72  ? ?Obesity: ?Wt Readings from Last 3 Encounters:  ?09/03/21 180 lb (81.6 kg)  ?03/05/21 179 lb (81.2 kg)  ?08/13/20 173 lb (78.5 kg)  ? ?BMI Readings from Last 3 Encounters:  ?09/03/21 32.92 kg/m?  ?03/05/21 33.82 kg/m?  ?08/13/20 32.69 kg/m?  ?  ? ?Vaccines:  ? ?HPV: N/A ?Tdap: she will get it at local pharmacy  ?Shingrix: up to date ?Pneumonia: up to date ?Flu: up to date ?COVID-19: she states had booster 03/2022 ? ? ?Hep C Screening: 04/26/13 ?STD testing and prevention (HIV/chl/gon/syphilis): N/A ?Intimate partner violence: negative screen  ?Sexual History : not sexually active in over one year - since she became a widow in 1996  ?Menstrual History/LMP/Abnormal Bleeding: post-menopausal, history of vaginal cancer we will  recheck pap smear  ?Discussed importance of follow up if any post-menopausal bleeding: yes  ?Incontinence Symptoms: negative for symptoms  ? ?Breast cancer:  ?- Last Mammogram: 10/03/20 ?- BRCA gene screening: N/A ? ?Osteoporosis Prevention : Discussed high calcium and vitamin D supplementation, weight bearing exercises ?Bone density :10/03/19  ? ?Cervical cancer screening: today , history of vaginal cancer  ? ?Skin cancer: Discussed monitoring for atypical lesions  ?Colorectal cancer: 10/21/16   ?Lung cancer:  Low Dose CT Chest recommended if Age 74-80 years, 20 pack-year currently smoking OR have quit w/in 15years. Patient does not qualify for screen   ?ECG: 05/13/20 ? ?Advanced Care Planning: A voluntary discussion about advance care planning including the explanation and discussion of advance directives.  Discussed health care proxy and Living will, and the patient was able to identify a health care proxy as brother - Glynn Octave .  Patient does not have a living will  but not sure about  power of attorney of health care  ? ?Lipids: ?Lab Results  ?Component Value Date  ? CHOL 156 08/13/2020  ? CHOL 154 08/08/2019  ? CHOL 134 07/29/2018  ? ?Lab Results  ?Component Value Date  ? HDL 64 08/13/2020  ? HDL 63 08/08/2019  ? HDL 60 07/29/2018  ? ?Lab Results  ?Component Value Date  ? [place] 77 08/13/2020  ? [place] 77 08/08/2019  ? [place] 61 07/29/2018  ? ?Lab Results  ?Component Value Date  ? TRIG 66 08/13/2020  ? TRIG 68 08/08/2019  ? TRIG 58 07/29/2018  ? ?Lab Results  ?Component Value Date  ? CHOLHDL 2.4 08/13/2020  ? CHOLHDL 2.4 08/08/2019  ? CHOLHDL 2.2 07/29/2018  ? ?No results found for: LDLDIRECT ? ?Glucose: ?Glucose, Bld  ?Date Value Ref Range Status  ?08/13/2020 81 65 - 99 mg/dL Final  ?  Comment:  ?  . ?           Fasting reference interval ?. ?  ?05/11/2020 97 70 - 99 mg/dL Final  ?  Comment:  ?  Glucose reference range applies only to samples taken after fasting for at least 8 hours.  ?08/08/2019 79 65 -  99 mg/dL Final  ?  Comment:  ?  . ?           Fasting reference interval ?. ?  ? ? ?Patient Active Problem List  ? Diagnosis Date Noted  ? Pulsatile tinnitus of right ear 06/18/2020  ? Pulsatile neck mass 06/18/2020  ? OSA (obstructive sleep apnea)  09/19/2019  ? Anxiety and depression 09/19/2019  ? Left thyroid nodule 08/08/2019  ? Neck pain 01/03/2019  ? Meningioma ([place]) 10/04/2017  ? Lichen sclerosus 81/85/6314  ? Vaginal atrophy 03/18/2016  ? Encounter for follow-up surveillance of vaginal cancer 03/18/2016  ? Eczema 10/31/2015  ? Left hand pain 09/10/2015  ? Osteopenia after menopause 07/26/2015  ? Menopause 07/26/2015  ? Irritable colon 07/26/2015  ? History of cancer of vagina 07/26/2015  ? Diffuse cystic mastopathy 07/26/2015  ? Obesity (BMI 30.0-34.9) 07/26/2015  ? Intermittent low back pain 07/26/2015  ? Hyperlipemia 07/26/2015  ? Osteoarthritis 05/13/2015  ? Allergic rhinitis 05/13/2015  ? Chronic GERD 05/13/2015  ? Essential hypertension, benign   ? ? ?Past Surgical History:  ?Procedure Laterality Date  ? BREAST EXCISIONAL BIOPSY Left   ? neg  ? BREAST LUMPECTOMY Left   ? COLONOSCOPY  08/26/2006  ? COLONOSCOPY WITH PROPOFOL N/A 10/21/2016  ? Procedure: COLONOSCOPY WITH PROPOFOL;  Surgeon: Christene Lye, MD;  Location: [facility] ENDOSCOPY;  Service: Endoscopy;  Laterality: N/A;  ? ESOPHAGOGASTRODUODENOSCOPY (EGD) WITH PROPOFOL N/A 02/07/2020  ? Procedure: ESOPHAGOGASTRODUODENOSCOPY (EGD) WITH PROPOFOL;  Surgeon: Lin Landsman, MD;  Location: [facility] ENDOSCOPY;  Service: Gastroenterology;  Laterality: N/A;  ? ? ?Family History  ?Problem Relation Age of Onset  ? Pulmonary embolism Mother   ? Hypertension Father   ? Aortic stenosis Father   ? Breast cancer Neg Hx   ? ? ?Social History  ? ?Socioeconomic History  ? Marital status: Widowed  ?  Spouse name: Not on file  ? Number of children: 0  ? Years of education: Not on file  ? Highest education level: Some college, no degree  ?Occupational History  ?  Occupation: cafeteria   ?  Employer: [employer]  ?Tobacco Use  ? Smoking status: Never  ? Smokeless tobacco: Never  ? Tobacco comments:  ?  experimented with dip snuff as a child  ?Vaping Use  ? Vaping Use: Never used

## 2021-09-02 NOTE — Patient Instructions (Signed)
Preventive Care 34 Years and Older, Female ?Preventive care refers to lifestyle choices and visits with your health care provider that can promote health and wellness. Preventive care visits are also called wellness exams. ?What can I expect for my preventive care visit? ?Counseling ?Your health care provider may ask you questions about your: ?Medical history, including: ?Past medical problems. ?Family medical history. ?Pregnancy and menstrual history. ?History of falls. ?Current health, including: ?Memory and ability to understand (cognition). ?Emotional well-being. ?Home life and relationship well-being. ?Sexual activity and sexual health. ?Lifestyle, including: ?Alcohol, nicotine or tobacco, and drug use. ?Access to firearms. ?Diet, exercise, and sleep habits. ?Work and work Statistician. ?Sunscreen use. ?Safety issues such as seatbelt and bike helmet use. ?Physical exam ?Your health care provider will check your: ?Height and weight. These may be used to calculate your BMI (body mass index). BMI is a measurement that tells if you are at a healthy weight. ?Waist circumference. This measures the distance around your waistline. This measurement also tells if you are at a healthy weight and may help predict your risk of certain diseases, such as type 2 diabetes and high blood pressure. ?Heart rate and blood pressure. ?Body temperature. ?Skin for abnormal spots. ?What immunizations do I need? ?Vaccines are usually given at various ages, according to a schedule. Your health care provider will recommend vaccines for you based on your age, medical history, and lifestyle or other factors, such as travel or where you work. ?What tests do I need? ?Screening ?Your health care provider may recommend screening tests for certain conditions. This may include: ?Lipid and cholesterol levels. ?Hepatitis C test. ?Hepatitis B test. ?HIV (human immunodeficiency virus) test. ?STI (sexually transmitted infection) testing, if you are at  risk. ?Lung cancer screening. ?Colorectal cancer screening. ?Diabetes screening. This is done by checking your blood sugar (glucose) after you have not eaten for a while (fasting). ?Mammogram. Talk with your health care provider about how often you should have regular mammograms. ?BRCA-related cancer screening. This may be done if you have a family history of breast, ovarian, tubal, or peritoneal cancers. ?Bone density scan. This is done to screen for osteoporosis. ?Talk with your health care provider about your test results, treatment options, and if necessary, the need for more tests. ?Follow these instructions at home: ?Eating and drinking ? ?Eat a diet that includes fresh fruits and vegetables, whole grains, lean protein, and low-fat dairy products. Limit your intake of foods with high amounts of sugar, saturated fats, and salt. ?Take vitamin and mineral supplements as recommended by your health care provider. ?Do not drink alcohol if your health care provider tells you not to drink. ?If you drink alcohol: ?Limit how much you have to 0-1 drink a day. ?Know how much alcohol is in your drink. In the [place] one drink equals one 12 oz bottle of beer (355 mL), one 5 oz glass of wine (148 mL), or one 1? oz glass of hard liquor (44 mL). ?Lifestyle ?Brush your teeth every morning and night with fluoride toothpaste. Floss one time each day. ?Exercise for at least 30 minutes 5 or more days each week. ?Do not use any products that contain nicotine or tobacco. These products include cigarettes, chewing tobacco, and vaping devices, such as e-cigarettes. If you need help quitting, ask your health care provider. ?Do not use drugs. ?If you are sexually active, practice safe sex. Use a condom or other form of protection in order to prevent STIs. ?Take aspirin only as told by your  health care provider. Make sure that you understand how much to take and what form to take. Work with your health care provider to find out whether it  is safe and beneficial for you to take aspirin daily. ?Ask your health care provider if you need to take a cholesterol-lowering medicine (statin). ?Find healthy ways to manage stress, such as: ?Meditation, yoga, or listening to music. ?Journaling. ?Talking to a trusted person. ?Spending time with friends and family. ?Minimize exposure to UV radiation to reduce your risk of skin cancer. ?Safety ?Always wear your seat belt while driving or riding in a vehicle. ?Do not drive: ?If you have been drinking alcohol. Do not ride with someone who has been drinking. ?When you are tired or distracted. ?While texting. ?If you have been using any mind-altering substances or drugs. ?Wear a helmet and other protective equipment during sports activities. ?If you have firearms in your house, make sure you follow all gun safety procedures. ?What's next? ?Visit your health care provider once a year for an annual wellness visit. ?Ask your health care provider how often you should have your eyes and teeth checked. ?Stay up to date on all vaccines. ?This information is not intended to replace advice given to you by your health care provider. Make sure you discuss any questions you have with your health care provider. ?Document Revised: 11/13/2020 Document Reviewed: 11/13/2020 ?Elsevier Patient Education ? [place]. ? ?

## 2021-09-03 ENCOUNTER — Encounter: Payer: Self-pay | Admitting: Family Medicine

## 2021-09-03 ENCOUNTER — Ambulatory Visit: Payer: Medicare PPO | Admitting: Family Medicine

## 2021-09-03 ENCOUNTER — Other Ambulatory Visit (HOSPITAL_COMMUNITY)
Admission: RE | Admit: 2021-09-03 | Discharge: 2021-09-03 | Disposition: A | Discharge status: Home / Self Care | Payer: Medicare PPO | Source: Ambulatory Visit | Attending: Family Medicine | Admitting: Family Medicine

## 2021-09-03 ENCOUNTER — Ambulatory Visit (INDEPENDENT_AMBULATORY_CARE_PROVIDER_SITE_OTHER): Payer: Medicare PPO | Admitting: Family Medicine

## 2021-09-03 VITALS — BP 122/72 | HR 66 | Resp 16 | Ht 62.0 in | Wt 180.0 lb

## 2021-09-03 DIAGNOSIS — Z124 Encounter for screening for malignant neoplasm of cervix: Secondary | ICD-10-CM | POA: Insufficient documentation

## 2021-09-03 DIAGNOSIS — E7800 Pure hypercholesterolemia, unspecified: Secondary | ICD-10-CM

## 2021-09-03 DIAGNOSIS — I10 Essential (primary) hypertension: Secondary | ICD-10-CM | POA: Diagnosis not present

## 2021-09-03 DIAGNOSIS — R21 Rash and other nonspecific skin eruption: Secondary | ICD-10-CM | POA: Diagnosis not present

## 2021-09-03 DIAGNOSIS — Z0000 Encounter for general adult medical examination without abnormal findings: Secondary | ICD-10-CM | POA: Diagnosis not present

## 2021-09-03 DIAGNOSIS — M542 Cervicalgia: Secondary | ICD-10-CM | POA: Diagnosis not present

## 2021-09-03 DIAGNOSIS — Z1231 Encounter for screening mammogram for malignant neoplasm of breast: Secondary | ICD-10-CM | POA: Diagnosis not present

## 2021-09-03 DIAGNOSIS — M8580 Other specified disorders of bone density and structure, unspecified site: Secondary | ICD-10-CM

## 2021-09-03 DIAGNOSIS — Z8544 Personal history of malignant neoplasm of other female genital organs: Secondary | ICD-10-CM | POA: Insufficient documentation

## 2021-09-03 DIAGNOSIS — G8929 Other chronic pain: Secondary | ICD-10-CM

## 2021-09-03 DIAGNOSIS — M549 Dorsalgia, unspecified: Secondary | ICD-10-CM

## 2021-09-03 DIAGNOSIS — R1900 Intra-abdominal and pelvic swelling, mass and lump, unspecified site: Secondary | ICD-10-CM

## 2021-09-03 DIAGNOSIS — Z780 Asymptomatic menopausal state: Secondary | ICD-10-CM

## 2021-09-03 DIAGNOSIS — D329 Benign neoplasm of meninges, unspecified: Secondary | ICD-10-CM | POA: Diagnosis not present

## 2021-09-03 DIAGNOSIS — K219 Gastro-esophageal reflux disease without esophagitis: Secondary | ICD-10-CM

## 2021-09-03 DIAGNOSIS — E2839 Other primary ovarian failure: Secondary | ICD-10-CM

## 2021-09-03 DIAGNOSIS — G4733 Obstructive sleep apnea (adult) (pediatric): Secondary | ICD-10-CM

## 2021-09-03 DIAGNOSIS — D708 Other neutropenia: Secondary | ICD-10-CM | POA: Diagnosis not present

## 2021-09-03 DIAGNOSIS — Z23 Encounter for immunization: Secondary | ICD-10-CM | POA: Diagnosis not present

## 2021-09-03 DIAGNOSIS — R739 Hyperglycemia, unspecified: Secondary | ICD-10-CM

## 2021-09-03 MED ORDER — TRIAMCINOLONE ACETONIDE 0.1 % EX CREA: TOPICAL_CREAM | Freq: Two times a day (BID) | CUTANEOUS | 0 refills | Status: DC

## 2021-09-03 MED ORDER — OMEPRAZOLE 20 MG PO CPDR: 20.0000 mg | DELAYED_RELEASE_CAPSULE | Freq: Two times a day (BID) | ORAL | 1 refills | Status: DC

## 2021-09-03 MED ORDER — OLMESARTAN MEDOXOMIL 20 MG PO TABS: 20.0000 mg | ORAL_TABLET | Freq: Every day | ORAL | 1 refills | Status: DC

## 2021-09-03 MED ORDER — TETANUS-DIPHTH-ACELL PERTUSSIS 5-2-15.5 LF-MCG/0.5 IM SUSP: 0.5000 mL | Freq: Once | INTRAMUSCULAR | 0 refills | Status: AC

## 2021-09-03 MED ORDER — CELECOXIB 200 MG PO CAPS: 200.0000 mg | ORAL_CAPSULE | Freq: Two times a day (BID) | ORAL | 0 refills | Status: DC

## 2021-09-03 MED ORDER — DILTIAZEM HCL ER COATED BEADS 360 MG PO CP24: ORAL_CAPSULE | ORAL | 1 refills | Status: DC

## 2021-09-03 MED ORDER — ROSUVASTATIN CALCIUM 10 MG PO TABS: 10.0000 mg | ORAL_TABLET | Freq: Every day | ORAL | 1 refills | Status: DC

## 2021-09-04 ENCOUNTER — Ambulatory Visit: Payer: Medicare PPO

## 2021-09-04 LAB — COMPLETE METABOLIC PANEL WITH GFR
AG Ratio: 1.9 (calc) (ref 1.0–2.5)
ALT: 15 U/L (ref 6–29)
AST: 18 U/L (ref 10–35)
Albumin: 4.6 g/dL (ref 3.6–5.1)
Alkaline phosphatase (APISO): 54 U/L (ref 37–153)
BUN: 18 mg/dL (ref 7–25)
CO2: 30 mmol/L (ref 20–32)
Calcium: 9.8 mg/dL (ref 8.6–10.4)
Chloride: 102 mmol/L (ref 98–110)
Creat: 0.72 mg/dL (ref 0.60–1.00)
Globulin: 2.4 g/dL (calc) (ref 1.9–3.7)
Glucose, Bld: 83 mg/dL (ref 65–99)
Potassium: 3.9 mmol/L (ref 3.5–5.3)
Sodium: 137 mmol/L (ref 135–146)
Total Bilirubin: 0.5 mg/dL (ref 0.2–1.2)
Total Protein: 7 g/dL (ref 6.1–8.1)
eGFR: 88 mL/min/{1.73_m2} (ref >=60)

## 2021-09-04 LAB — CBC WITH DIFFERENTIAL/PLATELET
Absolute Monocytes: 263 cells/uL (ref 200–950)
Basophils Absolute: 61 cells/uL (ref 0–200)
Basophils Relative: 1.7 %
Eosinophils Absolute: 112 cells/uL (ref 15–500)
Eosinophils Relative: 3.1 %
HCT: 44.2 % (ref 35.0–45.0)
Hemoglobin: 14.7 g/dL (ref 11.7–15.5)
Lymphs Abs: 1901 cells/uL (ref 850–3900)
MCH: 29.6 pg (ref 27.0–33.0)
MCHC: 33.3 g/dL (ref 32.0–36.0)
MCV: 88.9 fL (ref 80.0–100.0)
MPV: 11.9 fL (ref 7.5–12.5)
Monocytes Relative: 7.3 %
Neutro Abs: 1264 cells/uL — ABNORMAL LOW (ref 1500–7800)
Neutrophils Relative %: 35.1 %
Platelets: 234 10*3/uL (ref 140–400)
RBC: 4.97 10*6/uL (ref 3.80–5.10)
RDW: 12.8 % (ref 11.0–15.0)
Total Lymphocyte: 52.8 %
WBC: 3.6 10*3/uL — ABNORMAL LOW (ref 3.8–10.8)

## 2021-09-04 LAB — LIPID PANEL
Cholesterol: 158 mg/dL (ref <200)
HDL: 57 mg/dL (ref >=50)
LDL Cholesterol (Calc): 84 mg/dL (calc)
Non-HDL Cholesterol (Calc): 101 mg/dL (calc) (ref <130)
Total CHOL/HDL Ratio: 2.8 (calc) (ref <5.0)
Triglycerides: 81 mg/dL (ref <150)

## 2021-09-04 LAB — HEMOGLOBIN A1C
Hgb A1c MFr Bld: 5.6 % of total Hgb (ref <5.7)
Mean Plasma Glucose: 114 mg/dL
eAG (mmol/L): 6.3 mmol/L

## 2021-09-08 LAB — CYTOLOGY - PAP: Diagnosis: UNDETERMINED — AB

## 2021-09-09 MED ORDER — ROSUVASTATIN CALCIUM 20 MG PO TABS: 20.0000 mg | ORAL_TABLET | Freq: Every day | ORAL | 3 refills | Status: DC

## 2021-09-09 NOTE — Addendum Note (Signed)
Addended by: Teodora Medici on: 09/09/2021 03:15 PM ? ? Modules accepted: Orders ? ?

## 2021-09-10 ENCOUNTER — Ambulatory Visit: Payer: Medicare PPO

## 2021-09-12 ENCOUNTER — Ambulatory Visit: Admission: RE | Admit: 2021-09-12 | Payer: Medicare PPO | Source: Ambulatory Visit

## 2021-09-12 ENCOUNTER — Ambulatory Visit
Admission: RE | Admit: 2021-09-12 | Discharge: 2021-09-12 | Disposition: A | Discharge status: Home / Self Care | Payer: Medicare PPO | Source: Ambulatory Visit | Attending: Family Medicine | Admitting: Family Medicine

## 2021-09-12 DIAGNOSIS — R1900 Intra-abdominal and pelvic swelling, mass and lump, unspecified site: Secondary | ICD-10-CM

## 2021-09-12 DIAGNOSIS — Z8544 Personal history of malignant neoplasm of other female genital organs: Secondary | ICD-10-CM | POA: Insufficient documentation

## 2021-09-12 DIAGNOSIS — R102 Pelvic and perineal pain: Secondary | ICD-10-CM | POA: Diagnosis not present

## 2021-09-15 NOTE — Progress Notes (Signed)
Spoke with pt and she will come at her already scheduled appt in August

## 2021-09-17 ENCOUNTER — Ambulatory Visit: Payer: Medicare PPO

## 2021-09-22 ENCOUNTER — Telehealth: Payer: Self-pay

## 2021-09-22 NOTE — Telephone Encounter (Signed)
Copied from [place] [id number]. Topic: Referral - Question >> Sep 22, 2021  4:34 PM Pawlus, Brayton Layman A wrote: Reason for CRM: Pt called in asking if a referral could be sent in to [facility])  [address], [place], [place] [address].

## 2021-09-23 ENCOUNTER — Other Ambulatory Visit: Payer: Self-pay

## 2021-09-23 DIAGNOSIS — M722 Plantar fascial fibromatosis: Secondary | ICD-10-CM

## 2021-09-23 NOTE — Telephone Encounter (Signed)
Referral placed.

## 2021-09-24 ENCOUNTER — Ambulatory Visit: Payer: Medicare PPO

## 2021-09-30 ENCOUNTER — Ambulatory Visit: Payer: Medicare PPO | Admitting: Podiatry

## 2021-09-30 ENCOUNTER — Ambulatory Visit (INDEPENDENT_AMBULATORY_CARE_PROVIDER_SITE_OTHER): Payer: Medicare PPO

## 2021-09-30 DIAGNOSIS — M722 Plantar fascial fibromatosis: Secondary | ICD-10-CM

## 2021-09-30 MED ORDER — BETAMETHASONE SOD PHOS & ACET 6 (3-3) MG/ML IJ SUSP
3.0000 mg | Freq: Once | INTRAMUSCULAR | Status: AC
  Administered 2021-09-30: 3 mg via INTRA_ARTICULAR

## 2021-09-30 MED ORDER — METHYLPREDNISOLONE 4 MG PO TBPK: ORAL_TABLET | ORAL | 0 refills | Status: DC

## 2021-09-30 NOTE — Progress Notes (Signed)
? ?  Subjective: ?76 y.o. female presenting today as a new patient for evaluation of left heel pain this been going on for about 2-3 weeks now.  Patient states that she began increasing her walking exercises and developed heel pain.  She has not done anything for treatment.  She denies a history of injury. ? ? ?Past Medical History:  ?Diagnosis Date  ? Allergic rhinitis, cause unspecified   ? Anxiety state, unspecified   ? Contact dermatitis and other eczema, due to unspecified cause   ? Diffuse cystic mastopathy   ? Dyspepsia and other specified disorders of function of stomach   ? Essential hypertension, benign   ? Glaucoma   ? Irritable bowel syndrome   ? Lateral epicondylitis  of elbow   ? Leukocytopenia, unspecified   ? Mastodynia   ? Obesity, unspecified   ? Pure hypercholesterolemia   ? Reflux esophagitis   ? Thoracic or lumbosacral neuritis or radiculitis, unspecified   ? Unspecified disorder of skin and subcutaneous tissue   ? Vaginal cancer ([place])   ? History  ? ? ? ?Objective: ?Physical Exam ?General: The patient is alert and oriented x3 in no acute distress. ? ?Dermatology: Skin is warm, dry and supple bilateral lower extremities. Negative for open lesions or macerations bilateral.  ? ?Vascular: Dorsalis Pedis and Posterior Tibial pulses palpable bilateral.  Capillary fill time is immediate to all digits. ? ?Neurological: Epicritic and protective threshold intact bilateral.  ? ?Musculoskeletal: Tenderness to palpation to the plantar aspect of the left heel along the plantar fascia. All other joints range of motion within normal limits bilateral. Strength 5/5 in all groups bilateral.  ? ?Radiographic exam: Normal osseous mineralization. Joint spaces preserved. No fracture/dislocation/boney destruction. No other soft tissue abnormalities or radiopaque foreign bodies.  ? ?Assessment: ?1. Plantar fasciitis left foot ? ?Plan of Care:  ?1. Patient evaluated. Xrays reviewed.   ?2. Injection of 0.5cc Celestone  soluspan injected into the left plantar fascia.  ?3. Rx for Medrol Dose Pak placed ?4.  Patient has Celebrex at home.  Recommend Celebrex 100 mg twice daily as prescribed ?5.  Continue wearing good supportive shoes and sneakers.   ?6. Instructed patient regarding therapies and modalities at home to alleviate symptoms.  ?7. Return to clinic in 4 weeks.   ? ? ?Edrick Kins, DPM ?[facility] ? ?Dr. Edrick Kins, DPM  ?  ?[address]                                     ?[place], [place] [address]                ?Office [phone]  ?Fax [phone] ? ? ? ? ?

## 2021-10-01 ENCOUNTER — Ambulatory Visit: Payer: Medicare PPO

## 2021-10-08 ENCOUNTER — Ambulatory Visit: Payer: Medicare PPO

## 2021-10-14 DIAGNOSIS — H40153 Residual stage of open-angle glaucoma, bilateral: Secondary | ICD-10-CM | POA: Diagnosis not present

## 2021-10-15 ENCOUNTER — Ambulatory Visit: Payer: Medicare PPO

## 2021-10-22 ENCOUNTER — Ambulatory Visit: Payer: Medicare PPO

## 2021-10-28 ENCOUNTER — Encounter: Payer: Self-pay | Admitting: Internal Medicine

## 2021-10-28 ENCOUNTER — Ambulatory Visit: Payer: Medicare PPO | Admitting: Internal Medicine

## 2021-10-28 ENCOUNTER — Other Ambulatory Visit (HOSPITAL_COMMUNITY)
Admission: RE | Admit: 2021-10-28 | Discharge: 2021-10-28 | Disposition: A | Discharge status: Home / Self Care | Payer: Medicare PPO | Source: Ambulatory Visit | Attending: Internal Medicine | Admitting: Internal Medicine

## 2021-10-28 ENCOUNTER — Ambulatory Visit (INDEPENDENT_AMBULATORY_CARE_PROVIDER_SITE_OTHER): Payer: Medicare PPO | Admitting: Podiatry

## 2021-10-28 VITALS — BP 124/68 | HR 63 | Temp 97.9°F | Resp 16 | Wt 182.8 lb

## 2021-10-28 DIAGNOSIS — N898 Other specified noninflammatory disorders of vagina: Secondary | ICD-10-CM | POA: Diagnosis not present

## 2021-10-28 DIAGNOSIS — N950 Postmenopausal bleeding: Secondary | ICD-10-CM

## 2021-10-28 DIAGNOSIS — M722 Plantar fascial fibromatosis: Secondary | ICD-10-CM | POA: Diagnosis not present

## 2021-10-28 LAB — POCT URINALYSIS DIPSTICK
Bilirubin, UA: NEGATIVE
Blood, UA: POSITIVE
Glucose, UA: NEGATIVE
Ketones, UA: NEGATIVE
Leukocytes, UA: NEGATIVE
Nitrite, UA: NEGATIVE
Odor: NORMAL
Protein, UA: NEGATIVE
Spec Grav, UA: <=1.005 — AB (ref 1.010–1.025)
Urobilinogen, UA: 0.2 E.U./dL
pH, UA: 6 (ref 5.0–8.0)

## 2021-10-28 MED ORDER — BETAMETHASONE SOD PHOS & ACET 6 (3-3) MG/ML IJ SUSP
3.0000 mg | Freq: Once | INTRAMUSCULAR | Status: AC
  Administered 2021-10-28: 3 mg via INTRA_ARTICULAR

## 2021-10-28 NOTE — Progress Notes (Signed)
   Acute Office Visit  Subjective:     Patient ID: Vickie Stewart, female    DOB: [birth year], 76 y.o.   MRN: [id number]  Chief Complaint  Patient presents with   vaginal odor    And blood when swabed    HPI Patient is in today for vaginal odor.   VAGINAL ODOR Duration: months, getting worse  Discharge description:  no discharge    Pruritus: no Dysuria: no Malodorous: yes Urinary frequency: yes  Fevers: no Abdominal pain: no  Sexual activity: not sexually active History of sexually transmitted diseases: no Recent antibiotic use: no Context: worse  Treatments attempted: none  Had Pap on 09/03/21 with ASCUS results. Had a pelvic US on 09/12/21 that was negative. Does have some vaginal bleeding on swab today, however she states that her vaginal tissue and skin on the outside has been very irritated since the pelvic US. She feels like the odor gets worse throughout the day and she doesn't feel clean, so she is showering twice a day and scrubbing the vulva and vagina intensely. She did noticed occasional bleeding in the toilet bowl but thought this was from hemorrhoids.    Review of Systems  Constitutional:  Negative for chills and fever.  Genitourinary:  Positive for frequency and hematuria. Negative for dysuria and urgency.       Objective:    BP 124/68   Pulse 63   Temp 97.9 F (36.6 C)   Resp 16   Wt 182 lb 12.8 oz (82.9 kg)   SpO2 98%   BMI 33.43 kg/m  BP Readings from Last 3 Encounters:  10/28/21 124/68  09/03/21 122/72  03/05/21 120/74   Wt Readings from Last 3 Encounters:  10/28/21 182 lb 12.8 oz (82.9 kg)  09/03/21 180 lb (81.6 kg)  03/05/21 179 lb (81.2 kg)      Physical Exam Constitutional:      Appearance: Normal appearance.  HENT:     Head: Normocephalic and atraumatic.  Eyes:     Conjunctiva/sclera: Conjunctivae normal.  Cardiovascular:     Rate and Rhythm: Normal rate and regular rhythm.  Pulmonary:     Effort: Pulmonary effort is  normal.     Breath sounds: Normal breath sounds.  Genitourinary:    Comments: External genitalia with atrophy of tissues.  Vaginal mucosa pink with vaginal spotting present.    Skin:    General: Skin is warm and dry.  Neurological:     General: No focal deficit present.     Mental Status: She is alert. Mental status is at baseline.  Psychiatric:        Mood and Affect: Mood normal.        Behavior: Behavior normal.    No results found for any visits on 10/28/21.      Assessment & Plan:   1. Postmenopausal bleeding/Vaginal odor: Vaginal spotting after cervical swab. Spotting on speculum exam. Vaginal atrophy present but patient scrubbing outside skin and inside twice a day. Recommend gently cleaning the outside with a cotton washcloth or loofah with a fragrance free soap. Wear cotton underwear and loose fitting clothing. Returning in 3 months for repeat Pap. UA today negative for signs of infection.   - Cervicovaginal ancillary only - POCT Urinalysis Dipstick  Return for already scheduled in August .  Teodora Medici, DO

## 2021-10-28 NOTE — Progress Notes (Signed)
   Subjective: 76 y.o. female presenting today for follow-up evaluation of plantar fasciitis to the left foot this been going on for about 6 weeks now.  She says overall there is improvement.  She says the injection did help.  She states that she had to discontinue taking the Celebrex by her PCP due to acid reflux.  She presents for further treatment and evaluation   Past Medical History:  Diagnosis Date   Allergic rhinitis, cause unspecified    Anxiety state, unspecified    Contact dermatitis and other eczema, due to unspecified cause    Diffuse cystic mastopathy    Dyspepsia and other specified disorders of function of stomach    Essential hypertension, benign    Glaucoma    Irritable bowel syndrome    Lateral epicondylitis  of elbow    Leukocytopenia, unspecified    Mastodynia    Obesity, unspecified    Pure hypercholesterolemia    Reflux esophagitis    Thoracic or lumbosacral neuritis or radiculitis, unspecified    Unspecified disorder of skin and subcutaneous tissue    Vaginal cancer ([place])    History     Objective: Physical Exam General: The patient is alert and oriented x3 in no acute distress.  Dermatology: Skin is warm, dry and supple bilateral lower extremities. Negative for open lesions or macerations bilateral.   Vascular: Dorsalis Pedis and Posterior Tibial pulses palpable bilateral.  Capillary fill time is immediate to all digits.  Neurological: Epicritic and protective threshold intact bilateral.   Musculoskeletal: There continues to be some tenderness to palpation to the plantar aspect of the left heel along the plantar fascia. All other joints range of motion within normal limits bilateral. Strength 5/5 in all groups bilateral.   Radiographic exam 09/30/2021: Normal osseous mineralization. Joint spaces preserved. No fracture/dislocation/boney destruction. No other soft tissue abnormalities or radiopaque foreign bodies.   Assessment: 1. Plantar fasciitis left  foot  Plan of Care:  1. Patient evaluated. 2. Injection of 0.5cc Celestone soluspan injected into the left plantar fascia.  3.  Patient states that she was advised by her PCP to discontinue the Celebrex secondary to acid reflux.  Discontinue. 4.  Plantar fascial brace dispensed.  Wear daily 5.  Continue wearing good supportive shoes and sneakers 6.  Return to clinic 4 weeks  Edrick Kins, DPM [facility]  Dr. Edrick Kins, DPM    [address], [place] [address]                Office [phone]  Fax [phone]

## 2021-10-29 ENCOUNTER — Ambulatory Visit: Payer: Medicare PPO

## 2021-10-30 ENCOUNTER — Ambulatory Visit
Admission: RE | Admit: 2021-10-30 | Discharge: 2021-10-30 | Disposition: A | Discharge status: Home / Self Care | Payer: Medicare PPO | Source: Ambulatory Visit | Attending: Family Medicine | Admitting: Family Medicine

## 2021-10-30 DIAGNOSIS — Z780 Asymptomatic menopausal state: Secondary | ICD-10-CM | POA: Insufficient documentation

## 2021-10-30 DIAGNOSIS — E2839 Other primary ovarian failure: Secondary | ICD-10-CM | POA: Diagnosis not present

## 2021-10-30 DIAGNOSIS — M8580 Other specified disorders of bone density and structure, unspecified site: Secondary | ICD-10-CM | POA: Insufficient documentation

## 2021-10-30 DIAGNOSIS — Z1231 Encounter for screening mammogram for malignant neoplasm of breast: Secondary | ICD-10-CM | POA: Diagnosis not present

## 2021-10-30 DIAGNOSIS — M8589 Other specified disorders of bone density and structure, multiple sites: Secondary | ICD-10-CM | POA: Diagnosis not present

## 2021-10-30 LAB — CERVICOVAGINAL ANCILLARY ONLY
Bacterial Vaginitis (gardnerella): NEGATIVE
Candida Glabrata: NEGATIVE
Candida Vaginitis: NEGATIVE
Comment: NEGATIVE
Comment: NEGATIVE
Comment: NEGATIVE
Comment: NEGATIVE
Trichomonas: NEGATIVE

## 2021-12-05 ENCOUNTER — Ambulatory Visit: Payer: Medicare PPO | Admitting: Podiatry

## 2021-12-05 ENCOUNTER — Encounter: Payer: Self-pay | Admitting: Podiatry

## 2021-12-05 DIAGNOSIS — M722 Plantar fascial fibromatosis: Secondary | ICD-10-CM

## 2021-12-05 NOTE — Progress Notes (Signed)
   Subjective: 76 y.o. female presenting today for follow-up evaluation of plantar fasciitis to the left foot.  Patient states overall there is significant improvement.  She says the last 2 injections she received have helped significantly.  She only has some mild intermittent pain.  No new complaints at this time  Past Medical History:  Diagnosis Date   Allergic rhinitis, cause unspecified    Anxiety state, unspecified    Contact dermatitis and other eczema, due to unspecified cause    Diffuse cystic mastopathy    Dyspepsia and other specified disorders of function of stomach    Essential hypertension, benign    Glaucoma    Irritable bowel syndrome    Lateral epicondylitis  of elbow    Leukocytopenia, unspecified    Mastodynia    Obesity, unspecified    Pure hypercholesterolemia    Reflux esophagitis    Thoracic or lumbosacral neuritis or radiculitis, unspecified    Unspecified disorder of skin and subcutaneous tissue    Vaginal cancer ([place])    History     Objective: Physical Exam General: The patient is alert and oriented x3 in no acute distress.  Dermatology: Skin is warm, dry and supple bilateral lower extremities. Negative for open lesions or macerations bilateral.   Vascular: Dorsalis Pedis and Posterior Tibial pulses palpable bilateral.  Capillary fill time is immediate to all digits.  Neurological: Epicritic and protective threshold intact bilateral.   Musculoskeletal: Minimal tenderness to palpation to the plantar aspect of the left heel along the plantar fascia. All other joints range of motion within normal limits bilateral. Strength 5/5 in all groups bilateral.   Radiographic exam 09/30/2021: Normal osseous mineralization. Joint spaces preserved. No fracture/dislocation/boney destruction. No other soft tissue abnormalities or radiopaque foreign bodies.   Assessment: 1. Plantar fasciitis left foot  Plan of Care:  1. Patient evaluated. 2.  No injections  administered today 3.  Continue sketchers walking shoes 4.  Patient states that she takes Celebrex occasionally. 5.  Return to clinic as needed  Edrick Kins, DPM [facility]  Dr. Edrick Kins, DPM    [address], [place] [address]                Office [phone]  Fax [phone]

## 2022-01-06 NOTE — Progress Notes (Unsigned)
Name: Vickie Stewart   MRN: [id number]    DOB: 09-13-1945   Date:01/07/2022       Progress Note  Subjective  Chief Complaint  Follow Up  HPI  HTN: BP is at goal, no chest pain, palpitation or sob. She is compliant with benicar currently 20 mg and also cardizem 360 mg daily ,no orthostatic changes    Meningioma: still under the care of Dr. Lacinda Axon, last visit was Nov 2020  and had repeat MRI 11/22 that showed some progression but does not think dizziness secondary to that and will follow up in 2 years  No neuro deficit   Left thumb pain, ankle pain: she states celebrex helps with aches and pains, we will try decrease dose from 200 mg to 100 mg daily , she is aware of long term risk of nsaid's daily She also has intermittent right knee pain   GERD: she is taking omeprazole 20 mg  once or twice daily  she states symptom are controlled, sometimes she is able to skip second dose .    Thyroid nodule: seen by Dr. Gabriel Carina in 2015 no dysphagia, she went back 04/2020 and repeat US just showed a benign cyst left lower lobe. Right side of the neck used to puff up when she was coughing.  She had normal EGD done 01/2020, she states she was sent to vascular surgeon and studies were negative - unable to see the results, no problems lately   Hyperlipidemia: she takes Crestor, denies myalgias. Also taking fish oil. Last LDL went up to 84, we tried going up on crestor to 20 mg but she states it made her dizziness worse and is taking only 10 mg and we will try adding zetia    Dysthymia : she feels lonely, trying to get out of the house and stay busy    OSA: she has been wearing CPAP every night and is feeling better, wakes up and has to place mask back on. Compliant    History of vaginal cancer, s/p brachytherapy done at [facility] , last pap smear was done 2021 and normal but last pap smear 09/03/21 showed ASCUS, normal pelvic US, but needs to see gyn    Pre-diabetes: last A1C was down to 5.6 %  denies polyphagia,  polydipsia or polyuria   Left occipital neuralgia/Dizziness and balance problems : seen by Dr. Manuella Ghazi, ordered labs, MRI and NCS, showed still meningioma, also has some vascular disease brain, he offered injections and to stop baclofen and started magnesium oxide, she is now only doing home exercises states symptoms are stable.   Patient Active Problem List   Diagnosis Date Noted   Pulsatile tinnitus of right ear 06/18/2020   Pulsatile neck mass 06/18/2020   OSA (obstructive sleep apnea) 09/19/2019   Anxiety and depression 09/19/2019   Left thyroid nodule 08/08/2019   Neck pain 01/03/2019   Meningioma ([place]) 82/50/5397   Lichen sclerosus 67/34/1937   Vaginal atrophy 03/18/2016   Encounter for follow-up surveillance of vaginal cancer 03/18/2016   Eczema 10/31/2015   Left hand pain 09/10/2015   Osteopenia after menopause 07/26/2015   Menopause 07/26/2015   Irritable colon 07/26/2015   History of cancer of vagina 07/26/2015   Diffuse cystic mastopathy 07/26/2015   Obesity (BMI 30.0-34.9) 07/26/2015   Intermittent low back pain 07/26/2015   Hyperlipemia 07/26/2015   Osteoarthritis 05/13/2015   Allergic rhinitis 05/13/2015   Chronic GERD 05/13/2015   Essential hypertension, benign     Past Surgical History:  Procedure Laterality Date   BREAST EXCISIONAL BIOPSY Left    neg   BREAST LUMPECTOMY Left    COLONOSCOPY  08/26/2006   COLONOSCOPY WITH PROPOFOL N/A 10/21/2016   Procedure: COLONOSCOPY WITH PROPOFOL;  Surgeon: Christene Lye, MD;  Location: [facility] ENDOSCOPY;  Service: Endoscopy;  Laterality: N/A;   ESOPHAGOGASTRODUODENOSCOPY (EGD) WITH PROPOFOL N/A 02/07/2020   Procedure: ESOPHAGOGASTRODUODENOSCOPY (EGD) WITH PROPOFOL;  Surgeon: Lin Landsman, MD;  Location: [facility] ENDOSCOPY;  Service: Gastroenterology;  Laterality: N/A;    Family History  Problem Relation Age of Onset   Pulmonary embolism Mother    Hypertension Father    Aortic stenosis Father    Breast cancer Neg  Hx     Social History   Tobacco Use   Smoking status: Never   Smokeless tobacco: Never   Tobacco comments:    experimented with dip snuff as a child  Substance Use Topics   Alcohol use: No     Current Outpatient Medications:    aspirin 81 MG tablet, Take 81 mg by mouth daily. Reported on 10/10/2015, Disp: , Rfl:    celecoxib (CELEBREX) 200 MG capsule, Take 1 capsule (200 mg total) by mouth 2 (two) times daily., Disp: 90 capsule, Rfl: 0   cetirizine (ZYRTEC) 10 MG tablet, Take 1 tablet (10 mg total) by mouth daily., Disp: 30 tablet, Rfl: 0   cholecalciferol (VITAMIN D) 1000 units tablet, Take 1,000 Units by mouth daily., Disp: , Rfl:    desonide (DESOWEN) 0.05 % cream, Apply topically 2 (two) times daily., Disp: 60 g, Rfl: 0   diltiazem (CARDIZEM CD) 360 MG 24 hr capsule, TAKE 1 CAPSULE BY MOUTH EVERY DAY, Disp: 90 capsule, Rfl: 1   EPINEPHrine 0.3 mg/0.3 mL IJ SOAJ injection, Inject 0.3 mg into the muscle as needed for anaphylaxis., Disp: 2 each, Rfl: 0   latanoprost (XALATAN) 0.005 % ophthalmic solution, PLACE 1 DROP IN BOTH EYES AT BEDTIME, Disp: , Rfl: 4   Multiple Vitamin (MULTIVITAMIN) tablet, Take 1 tablet by mouth daily. Reported on 10/10/2015, Disp: , Rfl:    olmesartan (BENICAR) 20 MG tablet, Take 1 tablet (20 mg total) by mouth daily., Disp: 90 tablet, Rfl: 1   Omega-3 Fatty Acids (FISH OIL CONCENTRATE PO), Take by mouth. Reported on 10/10/2015, Disp: , Rfl:    omeprazole (PRILOSEC) 20 MG capsule, Take 1 capsule (20 mg total) by mouth in the morning and at bedtime., Disp: 180 capsule, Rfl: 1   rosuvastatin (CRESTOR) 20 MG tablet, Take 1 tablet (20 mg total) by mouth daily., Disp: 90 tablet, Rfl: 3   triamcinolone cream (KENALOG) 0.1 %, Apply topically 2 (two) times daily., Disp: 453.6 g, Rfl: 0   vitamin C (ASCORBIC ACID) 500 MG tablet, Take 500 mg by mouth daily. Reported on 10/10/2015, Disp: , Rfl:    vitamin E 400 UNIT capsule, Take 400 Units by mouth daily. Reported on  10/10/2015, Disp: , Rfl:   Allergies  Allergen Reactions   Atorvastatin     difficulty concentrating and focusing   Terbinafine And Related Dermatitis    Rash, itch, skin discoloration    I personally reviewed active problem list, medication list, allergies, family history, social history, health maintenance with the patient/caregiver today.   ROS  Constitutional: Negative for fever or weight change.  Respiratory: Negative for cough and shortness of breath.   Cardiovascular: Negative for chest pain or palpitations.  Gastrointestinal: Negative for abdominal pain, no bowel changes.  Musculoskeletal: Negative for gait problem or joint  swelling.  Skin: Negative for rash.  Neurological: Negative for dizziness or headache.  No other specific complaints in a complete review of systems (except as listed in HPI above).   Objective  Vitals:   01/07/22 1034  BP: 136/66  Pulse: 65  Resp: 16  SpO2: 97%  Weight: 182 lb (82.6 kg)  Height: '5\' 2"'$  (1.575 m)    Body mass index is 33.29 kg/m.  Physical Exam  Constitutional: Patient appears well-developed and well-nourished. Obese  No distress.  HEENT: head atraumatic, normocephalic, pupils equal and reactive to light, neck supple Cardiovascular: Normal rate, regular rhythm and normal heart sounds.  No murmur heard. Trace  BLE edema. Pulmonary/Chest: Effort normal and breath sounds normal. No respiratory distress. Abdominal: Soft.  There is no tenderness. Psychiatric: Patient has a normal mood and affect. behavior is normal. Judgment and thought content normal.   Recent Results (from the past 2160 hour(s))  Cervicovaginal ancillary only     Status: None   Collection Time: 10/28/21  2:41 PM  Result Value Ref Range   Trichomonas Negative    Bacterial Vaginitis (gard[place]) Negative    Candida Vaginitis Negative    Candida Glabrata Negative    Comment      Normal Reference Range Bacterial Vaginosis - Negative   Comment Normal  Reference Range Candida Species - Negative    Comment Normal Reference Range Candida Galbrata - Negative    Comment Normal Reference Range Trichomonas - Negative   POCT Urinalysis Dipstick     Status: Abnormal   Collection Time: 10/28/21  2:57 PM  Result Value Ref Range   Color, UA yellow    Clarity, UA clear    Glucose, UA Negative Negative   Bilirubin, UA neg    Ketones, UA neg    Spec Grav, UA <=1.005 (A) 1.010 - 1.025   Blood, UA positive    pH, UA 6.0 5.0 - 8.0   Protein, UA Negative Negative   Urobilinogen, UA 0.2 0.2 or 1.0 E.U./dL   Nitrite, UA neg    Leukocytes, UA Negative Negative   Appearance clear    Odor normal     PHQ2/9:    01/07/2022   10:34 AM 10/28/2021    2:29 PM 09/03/2021    1:52 PM 03/05/2021   11:28 AM 08/13/2020   10:53 AM  Depression screen PHQ 2/9  Decreased Interest 0 0 0 1 0  Down, Depressed, Hopeless 0 0 0 0 0  PHQ - 2 Score 0 0 0 1 0  Altered sleeping  0 0 0   Tired, decreased energy  0 0 0   Change in appetite  0 0 0   Feeling bad or failure about yourself   0 0 0   Trouble concentrating  0 0 0   Moving slowly or fidgety/restless  0 0 0   Suicidal thoughts  0 0 0   PHQ-9 Score  0 0 1   Difficult doing work/chores  Not difficult at all       phq 9 is negative   Fall Risk:    01/07/2022   10:34 AM 10/28/2021    2:27 PM 09/03/2021    1:52 PM 03/05/2021   11:28 AM 08/13/2020   10:53 AM  Fall Risk   Falls in the past year? 0 1 1 0 1  Number falls in past yr: 0 0 0 0 0  Injury with Fall? 0 1 1 0 0  Risk for fall due to :  No Fall Risks   No Fall Risks   Follow up Falls prevention discussed  Falls prevention discussed Falls prevention discussed       Functional Status Survey: Is the patient deaf or have difficulty hearing?: No Does the patient have difficulty seeing, even when wearing glasses/contacts?: No Does the patient have difficulty concentrating, remembering, or making decisions?: Yes Does the patient have difficulty walking or  climbing stairs?: No Does the patient have difficulty dressing or bathing?: No Does the patient have difficulty doing errands alone such as visiting a doctor's office or shopping?: No    Assessment & Plan  1. Pure hypercholesterolemia  - rosuvastatin (CRESTOR) 10 MG tablet; Take 1 tablet (10 mg total) by mouth daily.  Dispense: 90 tablet; Refill: 3 - ezetimibe (ZETIA) 10 MG tablet; Take 1 tablet (10 mg total) by mouth daily. For cholesterol  Dispense: 90 tablet; Refill: 1  2. Essential hypertension, benign  - diltiazem (CARDIZEM CD) 360 MG 24 hr capsule; TAKE 1 CAPSULE BY MOUTH EVERY DAY  Dispense: 90 capsule; Refill: 1 - olmesartan (BENICAR) 20 MG tablet; Take 1 tablet (20 mg total) by mouth daily.  Dispense: 90 tablet; Refill: 1  3. Chronic neck and back pain  - celecoxib (CELEBREX) 100 MG capsule; Take 1 capsule (100 mg total) by mouth daily.  Dispense: 90 capsule; Refill: 0  4. Gastroesophageal reflux disease without esophagitis   5. Other neutropenia ([place])   6. Meningioma ([facility])   7. Vertigo   8. Balance problems  Tripped and fell last night  9. OSA (obstructive sleep apnea)   10. Occipital neuralgia of left side  - magnesium oxide (MAG-OX) 400 (240 Mg) MG tablet; Take 400 mg by mouth daily.  11. ASCUS of cervix with negative high risk HPV  - Ambulatory referral to Obstetrics / Gynecology - last pap they did not look specifically for vaginal wall cells  12. History of cancer of vagina  - Ambulatory referral to Obstetrics / Gynecology

## 2022-01-07 ENCOUNTER — Encounter: Payer: Self-pay | Admitting: Family Medicine

## 2022-01-07 ENCOUNTER — Ambulatory Visit: Payer: Medicare PPO | Admitting: Family Medicine

## 2022-01-07 VITALS — BP 136/66 | HR 65 | Resp 16 | Ht 62.0 in | Wt 182.0 lb

## 2022-01-07 DIAGNOSIS — R42 Dizziness and giddiness: Secondary | ICD-10-CM

## 2022-01-07 DIAGNOSIS — K219 Gastro-esophageal reflux disease without esophagitis: Secondary | ICD-10-CM | POA: Diagnosis not present

## 2022-01-07 DIAGNOSIS — M549 Dorsalgia, unspecified: Secondary | ICD-10-CM

## 2022-01-07 DIAGNOSIS — R2689 Other abnormalities of gait and mobility: Secondary | ICD-10-CM | POA: Diagnosis not present

## 2022-01-07 DIAGNOSIS — G8929 Other chronic pain: Secondary | ICD-10-CM

## 2022-01-07 DIAGNOSIS — M542 Cervicalgia: Secondary | ICD-10-CM | POA: Diagnosis not present

## 2022-01-07 DIAGNOSIS — E7800 Pure hypercholesterolemia, unspecified: Secondary | ICD-10-CM | POA: Diagnosis not present

## 2022-01-07 DIAGNOSIS — G4733 Obstructive sleep apnea (adult) (pediatric): Secondary | ICD-10-CM | POA: Diagnosis not present

## 2022-01-07 DIAGNOSIS — Z8544 Personal history of malignant neoplasm of other female genital organs: Secondary | ICD-10-CM

## 2022-01-07 DIAGNOSIS — I10 Essential (primary) hypertension: Secondary | ICD-10-CM

## 2022-01-07 DIAGNOSIS — D708 Other neutropenia: Secondary | ICD-10-CM

## 2022-01-07 DIAGNOSIS — M5481 Occipital neuralgia: Secondary | ICD-10-CM

## 2022-01-07 DIAGNOSIS — R87610 Atypical squamous cells of undetermined significance on cytologic smear of cervix (ASC-US): Secondary | ICD-10-CM | POA: Insufficient documentation

## 2022-01-07 DIAGNOSIS — D329 Benign neoplasm of meninges, unspecified: Secondary | ICD-10-CM

## 2022-01-07 MED ORDER — ROSUVASTATIN CALCIUM 10 MG PO TABS: 10.0000 mg | ORAL_TABLET | Freq: Every day | ORAL | 3 refills | Status: DC

## 2022-01-07 MED ORDER — DILTIAZEM HCL ER COATED BEADS 360 MG PO CP24: ORAL_CAPSULE | ORAL | 1 refills | Status: DC

## 2022-01-07 MED ORDER — EZETIMIBE 10 MG PO TABS: 10.0000 mg | ORAL_TABLET | Freq: Every day | ORAL | 1 refills | Status: DC

## 2022-01-07 MED ORDER — CELECOXIB 100 MG PO CAPS: 100.0000 mg | ORAL_CAPSULE | Freq: Every day | ORAL | 0 refills | Status: DC

## 2022-01-07 MED ORDER — OLMESARTAN MEDOXOMIL 20 MG PO TABS: 20.0000 mg | ORAL_TABLET | Freq: Every day | ORAL | 1 refills | Status: DC

## 2022-02-09 ENCOUNTER — Other Ambulatory Visit (HOSPITAL_COMMUNITY)
Admission: RE | Admit: 2022-02-09 | Discharge: 2022-02-09 | Disposition: A | Discharge status: Home / Self Care | Payer: Medicare PPO | Source: Ambulatory Visit | Attending: Obstetrics & Gynecology | Admitting: Obstetrics & Gynecology

## 2022-02-09 ENCOUNTER — Ambulatory Visit (INDEPENDENT_AMBULATORY_CARE_PROVIDER_SITE_OTHER): Payer: Medicare PPO | Admitting: Obstetrics & Gynecology

## 2022-02-09 ENCOUNTER — Encounter: Payer: Self-pay | Admitting: Obstetrics & Gynecology

## 2022-02-09 VITALS — BP 110/70 | Ht 62.0 in | Wt 180.6 lb

## 2022-02-09 DIAGNOSIS — R87610 Atypical squamous cells of undetermined significance on cytologic smear of cervix (ASC-US): Secondary | ICD-10-CM | POA: Insufficient documentation

## 2022-02-09 DIAGNOSIS — Z8544 Personal history of malignant neoplasm of other female genital organs: Secondary | ICD-10-CM

## 2022-02-09 DIAGNOSIS — Z08 Encounter for follow-up examination after completed treatment for malignant neoplasm: Secondary | ICD-10-CM | POA: Insufficient documentation

## 2022-02-09 NOTE — Progress Notes (Signed)
   Established Patient Office Visit  Subjective   Patient ID: Vickie Stewart, female    DOB: 01-01-1946  Age: 76 y.o. MRN: [id number]  No chief complaint on file.   HPI   76 yo widowed P1 ([place] 0) here for further evaluation after her pap this year showed ASCUS. HVP was not done. I looked back at her paps over the years and they have all been negative with negative HR HPV.  She tells me that she had vaginal cancer "thirty years ago". It was treated with radiation.   She is abstinent and not in a relationship.  She had a negative pelvic ultrasound 09/21/2021 for suspected pelvic fullness on exam.  Objective:      PHYSICAL EXAM:  Well nourished, well hydrated Black female, no apparent distress She is ambulating and conversing normally. EG- moderate vulvovaginal atrophy. I did a thorough exam with a speculum, rotating it to visualize all aspects of the vagina. Her cervix is quite stenotic and atrophic. Bimanual exam was somewhat limited with her voluntary guarding, but when she relaxed, I could feel no masses.    The 10-year ASCVD risk score (Arnett DK, et al., 2019) is: 13.2%    Assessment & Plan:   Problem List Items Addressed This Visit   None  ASCUS pap with h/o vaginal cancer- I have obtained a sample for HR HPV. If it is negative, no further evaluation of this pap is indicated.  I have sent an email to gyn onc to see how long this lady needs to receive pap smears. I have rec'd an annual gynecology visit for vaginal cancer followup.   Emily Filbert, MD

## 2022-02-10 LAB — CERVICOVAGINAL ANCILLARY ONLY
Bacterial Vaginitis (gardnerella): NEGATIVE
Candida Glabrata: NEGATIVE
Candida Vaginitis: NEGATIVE
Comment: NEGATIVE
Comment: NEGATIVE
Comment: NEGATIVE

## 2022-03-11 ENCOUNTER — Other Ambulatory Visit: Payer: Self-pay

## 2022-03-11 MED ORDER — FLUAD QUADRIVALENT 0.5 ML IM PRSY
PREFILLED_SYRINGE | INTRAMUSCULAR | 0 refills | Status: DC
  Filled 2022-03-13: qty 0.5, 1d supply, fill #0

## 2022-03-11 MED ORDER — COVID-19 MRNA 2023-2024 VACCINE (COMIRNATY) 0.3 ML INJECTION
INTRAMUSCULAR | 0 refills | Status: DC
  Filled 2022-03-13: qty 0.3, 1d supply, fill #0

## 2022-03-13 ENCOUNTER — Other Ambulatory Visit: Payer: Self-pay

## 2022-03-17 DIAGNOSIS — H40153 Residual stage of open-angle glaucoma, bilateral: Secondary | ICD-10-CM | POA: Diagnosis not present

## 2022-05-11 ENCOUNTER — Other Ambulatory Visit: Payer: Self-pay | Admitting: Family Medicine

## 2022-05-11 DIAGNOSIS — I10 Essential (primary) hypertension: Secondary | ICD-10-CM

## 2022-06-12 DIAGNOSIS — G4733 Obstructive sleep apnea (adult) (pediatric): Secondary | ICD-10-CM | POA: Diagnosis not present

## 2022-07-09 NOTE — Progress Notes (Signed)
Name: Vickie Stewart   MRN: [id number]    DOB: [birth year]   Date:07/10/2022       Progress Note  Subjective  Chief Complaint  Follow Up  HPI  HTN: BP is at goal, no chest pain, palpitation or sob. She is compliant with benicar currently 20 mg and also cardizem 360 mg daily ,no orthostatic changes Continue current medications    Meningioma: still under the care of Dr. Lacinda Axon, last visit was Nov 2020  and had repeat MRI 11/22 that showed some progression but does not think dizziness secondary to that and will follow up in 2 years , next appointment 05/2023  Left thumb pain, ankle pain: she states celebrex helps with aches and pains, she is taking tyleno most of the time, celebrex prn   GERD: she is taking omeprazole 20 mg  once or twice daily , she skips pm dose at times and is doing well.    Thyroid nodule: seen by Dr. Gabriel Carina in 2015 no dysphagia, she went back 04/2020 and repeat US just showed a benign cyst left lower lobe. Right side of the neck used to puff up when she was coughing.   Hyperlipidemia: she takes Crestor, denies myalgias. Also taking fish oil. Last LDL went up to 84, we tried going up on crestor to 20 mg but she states it made her dizziness worse , she tried Zetia but she stopped on her own because it made her not feel well - she is not sure if taking it, she will let me know when she gets home   Dysthymia : she feels lonely, trying to get out of the house and stay busy . She goes to church, going to ball games.    Other neutropenia: we will recheck labs   OSA: she has been wearing CPAP every night and is feeling better, wakes up and has to place mask back on. Unchanged   History of vaginal cancer, s/p brachytherapy done at [facility] , last pap smear was done 2021 and normal but last pap smear 09/03/21 showed ASCUS, normal pelvic US, she was seen by gyn in August 2023    Pre-diabetes: last A1C was down to 5.6 %  denies polyphagia, polydipsia or polyuria.    Left occipital  neuralgia/Dizziness and balance problems/Meningioma : seen by Dr. Manuella Ghazi, ordered labs, MRI and NCS, showed still meningioma, also has some vascular disease brain, he offered injections and to stop baclofen and started magnesium oxide. She had PT and symptoms improved temporarily . Advised her to try again . She is under the care of Dr. Lacinda Axon , visits every other year - next visit due in 05/2023   Patient Active Problem List   Diagnosis Date Noted   ASCUS of cervix with negative high risk HPV 01/07/2022   Occipital neuralgia of left side 01/07/2022   Balance problems 01/07/2022   Vertigo 01/07/2022   Pulsatile tinnitus of right ear 06/18/2020   Pulsatile neck mass 06/18/2020   OSA (obstructive sleep apnea) 09/19/2019   Anxiety and depression 09/19/2019   Other neutropenia ([place]) 08/11/2019   Left thyroid nodule 08/08/2019   Chronic neck and back pain 01/03/2019   Meningioma ([place]) 0000000   Lichen sclerosus [id number]   Vaginal atrophy 03/18/2016   Encounter for follow-up surveillance of vaginal cancer 03/18/2016   Eczema 10/31/2015   Left hand pain 09/10/2015   Osteopenia after menopause 07/26/2015   Menopause 07/26/2015   Irritable colon 07/26/2015   History of cancer of vagina  07/26/2015   Diffuse cystic mastopathy 07/26/2015   Obesity (BMI 30.0-34.9) 07/26/2015   Intermittent low back pain 07/26/2015   Hyperlipemia 07/26/2015   Osteoarthritis 05/13/2015   Allergic rhinitis 05/13/2015   Gastroesophageal reflux disease without esophagitis 05/13/2015   Essential hypertension, benign     Past Surgical History:  Procedure Laterality Date   BREAST EXCISIONAL BIOPSY Left    neg   BREAST LUMPECTOMY Left    COLONOSCOPY  08/26/2006   COLONOSCOPY WITH PROPOFOL N/A 10/21/2016   Procedure: COLONOSCOPY WITH PROPOFOL;  Surgeon: Christene Lye, MD;  Location: [facility] ENDOSCOPY;  Service: Endoscopy;  Laterality: N/A;   ESOPHAGOGASTRODUODENOSCOPY (EGD) WITH PROPOFOL N/A 02/07/2020    Procedure: ESOPHAGOGASTRODUODENOSCOPY (EGD) WITH PROPOFOL;  Surgeon: Lin Landsman, MD;  Location: [facility] ENDOSCOPY;  Service: Gastroenterology;  Laterality: N/A;    Family History  Problem Relation Age of Onset   Pulmonary embolism Mother    Hypertension Father    Aortic stenosis Father    Breast cancer Neg Hx     Social History   Tobacco Use   Smoking status: Never   Smokeless tobacco: Never   Tobacco comments:    experimented with dip snuff as a child  Substance Use Topics   Alcohol use: No     Current Outpatient Medications:    aspirin 81 MG tablet, Take 81 mg by mouth daily. Reported on 10/10/2015, Disp: , Rfl:    celecoxib (CELEBREX) 100 MG capsule, Take 1 capsule (100 mg total) by mouth daily., Disp: 90 capsule, Rfl: 0   cholecalciferol (VITAMIN D) 1000 units tablet, Take 1,000 Units by mouth daily., Disp: , Rfl:    diltiazem (CARDIZEM CD) 360 MG 24 hr capsule, TAKE 1 CAPSULE BY MOUTH EVERY DAY, Disp: 90 capsule, Rfl: 1   latanoprost (XALATAN) 0.005 % ophthalmic solution, PLACE 1 DROP IN BOTH EYES AT BEDTIME, Disp: , Rfl: 4   magnesium oxide (MAG-OX) 400 (240 Mg) MG tablet, Take 400 mg by mouth daily., Disp: , Rfl:    olmesartan (BENICAR) 20 MG tablet, Take 1 tablet (20 mg total) by mouth daily., Disp: 90 tablet, Rfl: 1   Omega-3 Fatty Acids (FISH OIL CONCENTRATE PO), Take by mouth. Reported on 10/10/2015, Disp: , Rfl:    omeprazole (PRILOSEC) 20 MG capsule, Take 1 capsule (20 mg total) by mouth in the morning and at bedtime., Disp: 180 capsule, Rfl: 1   rosuvastatin (CRESTOR) 10 MG tablet, Take 1 tablet (10 mg total) by mouth daily., Disp: 90 tablet, Rfl: 3   triamcinolone cream (KENALOG) 0.1 %, Apply topically 2 (two) times daily., Disp: 453.6 g, Rfl: 0  Allergies  Allergen Reactions   Atorvastatin     difficulty concentrating and focusing   Terbinafine And Related Dermatitis    Rash, itch, skin discoloration   Zetia [Ezetimibe] Other (See Comments)     Muscle/joint pain    I personally reviewed active problem list, medication list, allergies, family history, social history, health maintenance with the patient/caregiver today.   ROS  Constitutional: Negative for fever or weight change.  Respiratory: Negative for cough and shortness of breath.   Cardiovascular: Negative for chest pain or palpitations.  Gastrointestinal: Negative for abdominal pain, no bowel changes.  Musculoskeletal: Negative for gait problem or joint swelling.  Skin: Negative for rash.  Neurological: positive for dizziness and also intermittent  headache.  No other specific complaints in a complete review of systems (except as listed in HPI above).   Objective  Vitals:   07/10/22  1037  BP: 132/68  Pulse: 61  Resp: 14  Temp: 97.7 F (36.5 C)  TempSrc: Oral  SpO2: 97%  Weight: 180 lb 1.6 oz (81.7 kg)  Height: 5' 2"$  (1.575 m)    Body mass index is 32.94 kg/m.  Physical Exam  Constitutional: Patient appears well-developed and well-nourished. Obese  No distress.  HEENT: head atraumatic, normocephalic, pupils equal and reactive to light, ears normal TM, neck supple Cardiovascular: Normal rate, regular rhythm and normal heart sounds.  No murmur heard. No BLE edema. Pulmonary/Chest: Effort normal and breath sounds normal. No respiratory distress. Abdominal: Soft.  There is no tenderness. Psychiatric: Patient has a normal mood and affect. behavior is normal. Judgment and thought content normal.   PHQ2/9:    07/10/2022   10:41 AM 01/07/2022   10:34 AM 10/28/2021    2:29 PM 09/03/2021    1:52 PM 03/05/2021   11:28 AM  Depression screen PHQ 2/9  Decreased Interest 0 0 0 0 1  Down, Depressed, Hopeless 0 0 0 0 0  PHQ - 2 Score 0 0 0 0 1  Altered sleeping 0  0 0 0  Tired, decreased energy 0  0 0 0  Change in appetite 0  0 0 0  Feeling bad or failure about yourself  0  0 0 0  Trouble concentrating 0  0 0 0  Moving slowly or fidgety/restless 0  0 0 0  Suicidal  thoughts 0  0 0 0  PHQ-9 Score 0  0 0 1  Difficult doing work/chores   Not difficult at all      phq 9 is negative   Fall Risk:    07/10/2022   10:40 AM 01/07/2022   10:34 AM 10/28/2021    2:27 PM 09/03/2021    1:52 PM 03/05/2021   11:28 AM  Fall Risk   Falls in the past year? 0 0 1 1 0  Number falls in past yr:  0 0 0 0  Injury with Fall?  0 1 1 0  Risk for fall due to : No Fall Risks No Fall Risks   No Fall Risks  Follow up Falls prevention discussed;Education provided;Falls evaluation completed Falls prevention discussed  Falls prevention discussed Falls prevention discussed    Assessment & Plan  1. Essential hypertension, benign  - diltiazem (CARDIZEM CD) 360 MG 24 hr capsule; TAKE 1 CAPSULE BY MOUTH EVERY DAY  Dispense: 90 capsule; Refill: 1 - olmesartan (BENICAR) 20 MG tablet; Take 1 tablet (20 mg total) by mouth daily.  Dispense: 90 tablet; Refill: 1  2. Other neutropenia ([place])  Recheck labs next visit   3. Meningioma [facility])  Due for follow up Dec 2024   4. Chronic neck and back pain  - celecoxib (CELEBREX) 100 MG capsule; Take 1 capsule (100 mg total) by mouth daily.  Dispense: 90 capsule; Refill: 0  5. Gastroesophageal reflux disease without esophagitis  - omeprazole (PRILOSEC) 20 MG capsule; Take 1 capsule (20 mg total) by mouth in the morning and at bedtime.  Dispense: 180 capsule; Refill: 1  6. Vertigo   7. Balance problems  Had PT, needs to go back to neurologist   8. OSA (obstructive sleep apnea)   9. Osteopenia after menopause  Discussed high calcium die t  10. Thyroid cyst   11. Pure hypercholesterolemia

## 2022-07-10 ENCOUNTER — Encounter: Payer: Self-pay | Admitting: Family Medicine

## 2022-07-10 ENCOUNTER — Ambulatory Visit: Payer: Medicare PPO | Admitting: Family Medicine

## 2022-07-10 VITALS — BP 132/68 | HR 61 | Temp 97.7°F | Resp 14 | Ht 62.0 in | Wt 180.1 lb

## 2022-07-10 DIAGNOSIS — M542 Cervicalgia: Secondary | ICD-10-CM | POA: Diagnosis not present

## 2022-07-10 DIAGNOSIS — R2689 Other abnormalities of gait and mobility: Secondary | ICD-10-CM

## 2022-07-10 DIAGNOSIS — M8580 Other specified disorders of bone density and structure, unspecified site: Secondary | ICD-10-CM | POA: Diagnosis not present

## 2022-07-10 DIAGNOSIS — R42 Dizziness and giddiness: Secondary | ICD-10-CM

## 2022-07-10 DIAGNOSIS — I10 Essential (primary) hypertension: Secondary | ICD-10-CM

## 2022-07-10 DIAGNOSIS — G4733 Obstructive sleep apnea (adult) (pediatric): Secondary | ICD-10-CM

## 2022-07-10 DIAGNOSIS — M549 Dorsalgia, unspecified: Secondary | ICD-10-CM

## 2022-07-10 DIAGNOSIS — D329 Benign neoplasm of meninges, unspecified: Secondary | ICD-10-CM

## 2022-07-10 DIAGNOSIS — K219 Gastro-esophageal reflux disease without esophagitis: Secondary | ICD-10-CM

## 2022-07-10 DIAGNOSIS — Z780 Asymptomatic menopausal state: Secondary | ICD-10-CM

## 2022-07-10 DIAGNOSIS — E7800 Pure hypercholesterolemia, unspecified: Secondary | ICD-10-CM

## 2022-07-10 DIAGNOSIS — G8929 Other chronic pain: Secondary | ICD-10-CM

## 2022-07-10 DIAGNOSIS — E041 Nontoxic single thyroid nodule: Secondary | ICD-10-CM

## 2022-07-10 DIAGNOSIS — D708 Other neutropenia: Secondary | ICD-10-CM

## 2022-07-10 MED ORDER — OLMESARTAN MEDOXOMIL 20 MG PO TABS: 20.0000 mg | ORAL_TABLET | Freq: Every day | ORAL | 1 refills | Status: DC

## 2022-07-10 MED ORDER — OMEPRAZOLE 20 MG PO CPDR: 20.0000 mg | DELAYED_RELEASE_CAPSULE | Freq: Two times a day (BID) | ORAL | 1 refills | Status: DC

## 2022-07-10 MED ORDER — CELECOXIB 100 MG PO CAPS: 100.0000 mg | ORAL_CAPSULE | Freq: Every day | ORAL | 0 refills | Status: DC

## 2022-07-10 MED ORDER — DILTIAZEM HCL ER COATED BEADS 360 MG PO CP24: ORAL_CAPSULE | ORAL | 1 refills | Status: DC

## 2022-07-10 NOTE — Patient Instructions (Addendum)
Dr. Manuella Ghazi - neurologist at [facility] [phone] (Work)    [facility]: you need to schedule vaginal cancer screen - as recommended [phone]

## 2022-07-28 DIAGNOSIS — H40153 Residual stage of open-angle glaucoma, bilateral: Secondary | ICD-10-CM | POA: Diagnosis not present

## 2022-07-30 DIAGNOSIS — R2689 Other abnormalities of gait and mobility: Secondary | ICD-10-CM | POA: Diagnosis not present

## 2022-07-30 DIAGNOSIS — D329 Benign neoplasm of meninges, unspecified: Secondary | ICD-10-CM | POA: Diagnosis not present

## 2022-07-30 DIAGNOSIS — M5481 Occipital neuralgia: Secondary | ICD-10-CM | POA: Diagnosis not present

## 2022-07-30 DIAGNOSIS — G939 Disorder of brain, unspecified: Secondary | ICD-10-CM | POA: Diagnosis not present

## 2022-07-30 DIAGNOSIS — H93A3 Pulsatile tinnitus, bilateral: Secondary | ICD-10-CM | POA: Diagnosis not present

## 2022-07-30 DIAGNOSIS — R202 Paresthesia of skin: Secondary | ICD-10-CM | POA: Diagnosis not present

## 2022-08-04 ENCOUNTER — Other Ambulatory Visit: Payer: Self-pay | Admitting: Neurology

## 2022-08-04 DIAGNOSIS — H93A3 Pulsatile tinnitus, bilateral: Secondary | ICD-10-CM

## 2022-08-09 ENCOUNTER — Ambulatory Visit
Admission: RE | Admit: 2022-08-09 | Discharge: 2022-08-09 | Disposition: A | Discharge status: Home / Self Care | Payer: Medicare PPO | Source: Ambulatory Visit | Attending: Neurology | Admitting: Neurology

## 2022-08-09 DIAGNOSIS — H93A3 Pulsatile tinnitus, bilateral: Secondary | ICD-10-CM | POA: Insufficient documentation

## 2022-08-09 DIAGNOSIS — D320 Benign neoplasm of cerebral meninges: Secondary | ICD-10-CM | POA: Diagnosis not present

## 2022-08-09 MED ORDER — GADOBUTROL 1 MMOL/ML IV SOLN
7.5000 mL | Freq: Once | INTRAVENOUS | Status: AC | PRN
  Administered 2022-08-09: 7.5 mL via INTRAVENOUS

## 2022-09-14 DIAGNOSIS — G4733 Obstructive sleep apnea (adult) (pediatric): Secondary | ICD-10-CM | POA: Diagnosis not present

## 2022-09-21 ENCOUNTER — Encounter: Payer: Self-pay | Admitting: Family Medicine

## 2022-09-21 NOTE — Progress Notes (Unsigned)
Referring Physician:  Lonell Face, MD [phone] [facility] [address] [facility] [place],  [place] [address]  Primary Physician:  Alba Cory, MD  History of Present Illness: 09/22/2022 Vickie Stewart is here today with a chief complaint of a known meningioma.  She recently had this imaged which showed an increase in size and change in imaging characteristics.  She has been having some increased headaches.  She reports some intermittent dizziness.    She reports some decreased sensation on the left side of her body.  She denies any weakness.    Past Surgery: denies  Vickie Stewart has no symptoms of cervical myelopathy.  The symptoms are causing a significant impact on the patient's life.   I have utilized the care everywhere function in epic to review the outside records available from external health systems.  Review of Systems:  A 10 point review of systems is negative, except for the pertinent positives and negatives detailed in the HPI.  Past Medical History: Past Medical History:  Diagnosis Date   Allergic rhinitis, cause unspecified    Anxiety state, unspecified    Contact dermatitis and other eczema, due to unspecified cause    Diffuse cystic mastopathy    Dyspepsia and other specified disorders of function of stomach    Essential hypertension, benign    Glaucoma    Irritable bowel syndrome    Lateral epicondylitis  of elbow    Leukocytopenia, unspecified    Mastodynia    Obesity, unspecified    Pure hypercholesterolemia    Reflux esophagitis    Thoracic or lumbosacral neuritis or radiculitis, unspecified    Unspecified disorder of skin and subcutaneous tissue    Vaginal cancer    History    Past Surgical History: Past Surgical History:  Procedure Laterality Date   BREAST EXCISIONAL BIOPSY Left    neg   BREAST LUMPECTOMY Left    COLONOSCOPY  08/26/2006   COLONOSCOPY WITH PROPOFOL N/A 10/21/2016   Procedure: COLONOSCOPY WITH  PROPOFOL;  Surgeon: Kieth Brightly, MD;  Location: [facility] ENDOSCOPY;  Service: Endoscopy;  Laterality: N/A;   ESOPHAGOGASTRODUODENOSCOPY (EGD) WITH PROPOFOL N/A 02/07/2020   Procedure: ESOPHAGOGASTRODUODENOSCOPY (EGD) WITH PROPOFOL;  Surgeon: Toney Reil, MD;  Location: [facility] ENDOSCOPY;  Service: Gastroenterology;  Laterality: N/A;    Allergies: Allergies as of 09/22/2022 - Review Complete 09/22/2022  Allergen Reaction Noted   Atorvastatin  07/26/2015   Terbinafine and related Dermatitis 01/23/2016   Zetia [ezetimibe] Other (See Comments) 07/10/2022    Medications:  Current Outpatient Medications:    aspirin 81 MG tablet, Take 81 mg by mouth daily. Reported on 10/10/2015, Disp: , Rfl:    celecoxib (CELEBREX) 100 MG capsule, Take 1 capsule (100 mg total) by mouth daily., Disp: 90 capsule, Rfl: 0   cholecalciferol (VITAMIN D) 1000 units tablet, Take 1,000 Units by mouth daily., Disp: , Rfl:    diltiazem (CARDIZEM CD) 360 MG 24 hr capsule, TAKE 1 CAPSULE BY MOUTH EVERY DAY, Disp: 90 capsule, Rfl: 1   latanoprost (XALATAN) 0.005 % ophthalmic solution, PLACE 1 DROP IN BOTH EYES AT BEDTIME, Disp: , Rfl: 4   magnesium oxide (MAG-OX) 400 (240 Mg) MG tablet, Take 400 mg by mouth daily., Disp: , Rfl:    olmesartan (BENICAR) 20 MG tablet, Take 1 tablet (20 mg total) by mouth daily., Disp: 90 tablet, Rfl: 1   Omega-3 Fatty Acids (FISH OIL CONCENTRATE PO), Take by mouth. Reported on 10/10/2015, Disp: , Rfl:  omeprazole (PRILOSEC) 20 MG capsule, Take 1 capsule (20 mg total) by mouth in the morning and at bedtime., Disp: 180 capsule, Rfl: 1   rosuvastatin (CRESTOR) 10 MG tablet, Take 1 tablet (10 mg total) by mouth daily., Disp: 90 tablet, Rfl: 3   triamcinolone cream (KENALOG) 0.1 %, Apply topically 2 (two) times daily., Disp: 453.6 g, Rfl: 0  Social History: Social History   Tobacco Use   Smoking status: Never   Smokeless tobacco: Never   Tobacco comments:    experimented with dip  snuff as a child  Vaping Use   Vaping Use: Never used  Substance Use Topics   Alcohol use: No   Drug use: No    Family Medical History: Family History  Problem Relation Age of Onset   Pulmonary embolism Mother    Hypertension Father    Aortic stenosis Father    Breast cancer Neg Hx     Physical Examination: Vitals:   09/22/22 0845  BP: (!) 146/84    General: Patient is well developed, well nourished, calm, collected, and in no apparent distress. Attention to examination is appropriate.  Neck:   Supple.  Full range of motion.  Respiratory: Patient is breathing without any difficulty.   NEUROLOGICAL:     Awake, alert, oriented to person, place, and time.  Speech is clear and fluent.   Cranial Nerves: Pupils equal round and reactive to light.  Facial tone is symmetric.  Facial sensation is symmetric. Shoulder shrug is symmetric. Tongue protrusion is midline.  There is no pronator drift.  ROM of spine: full.    Strength: Side Biceps Triceps Deltoid Interossei Grip Wrist Ext. Wrist Flex.  R 5 5 5 5 5 5 5   L 5 5 5 5 5 5 5    Side Iliopsoas Quads Hamstring PF DF EHL  R 5 5 5 5 5 5   L 5 5 5 5 5 5    Reflexes are 1+ and symmetric at the biceps, triceps, brachioradialis, patella and achilles.   Hoffman's is absent.   Bilateral upper and lower extremity sensation is intact to light touch.    No evidence of dysmetria noted.  Gait is normal.     Medical Decision Making  Imaging: MRV Head 08/09/2022 IMPRESSION: 1. No evidence of intracranial venous thrombosis. 2. No specific cause of pulsatile tinnitus is identified. 3. 2.3 x 1.4 x 2.4 cm right parafalcine meningioma, slightly increased in size from the prior brain MRI of 04/22/2021. As before, there is local mass effect upon the underlying right frontal lobe with mild underlying parenchymal edema.     Electronically Signed   By: Jackey Loge D.O.   On: 08/11/2022 08:57  I have personally reviewed the images and  agree with the above interpretation.  Assessment and Plan: Vickie Stewart is a pleasant 77 y.o. female with likely meningioma with slight increase in size and change in imaging characteristics.  We reviewed the options including short interval imaging follow-up, surgery, or radiation.  I recommended that she pursue either short interval imaging or radiation.  She would like to discuss radiation treatment.  I will make this referral.  I will be happy to see her back at any point should she need to be considered for surgery.  I spent a total of 30 minutes in this patient's care today. This time was spent reviewing pertinent records including imaging studies, obtaining and confirming history, performing a directed evaluation, formulating and discussing my recommendations, and documenting the visit within  the medical record.      Thank you for involving me in the care of this patient.       K. Myer Haff MD, [facility] Neurosurgery

## 2022-09-22 ENCOUNTER — Encounter: Payer: Self-pay | Admitting: Neurosurgery

## 2022-09-22 ENCOUNTER — Ambulatory Visit: Payer: Medicare PPO | Admitting: Neurosurgery

## 2022-09-22 VITALS — BP 146/84 | Ht 62.0 in | Wt 181.2 lb

## 2022-09-22 DIAGNOSIS — D329 Benign neoplasm of meninges, unspecified: Secondary | ICD-10-CM

## 2022-09-23 ENCOUNTER — Ambulatory Visit
Admission: RE | Admit: 2022-09-23 | Discharge: 2022-09-23 | Disposition: A | Discharge status: Home / Self Care | Payer: Medicare PPO | Source: Ambulatory Visit | Attending: Radiation Oncology | Admitting: Radiation Oncology

## 2022-09-23 ENCOUNTER — Encounter: Payer: Self-pay | Admitting: Radiation Oncology

## 2022-09-23 ENCOUNTER — Other Ambulatory Visit: Payer: Self-pay | Admitting: *Deleted

## 2022-09-23 VITALS — BP 136/72 | HR 54 | Temp 97.6°F | Ht 61.0 in | Wt 181.0 lb

## 2022-09-23 DIAGNOSIS — Z7982 Long term (current) use of aspirin: Secondary | ICD-10-CM | POA: Insufficient documentation

## 2022-09-23 DIAGNOSIS — D329 Benign neoplasm of meninges, unspecified: Secondary | ICD-10-CM | POA: Diagnosis not present

## 2022-09-23 DIAGNOSIS — E7800 Pure hypercholesterolemia, unspecified: Secondary | ICD-10-CM | POA: Diagnosis not present

## 2022-09-23 DIAGNOSIS — E669 Obesity, unspecified: Secondary | ICD-10-CM | POA: Diagnosis not present

## 2022-09-23 DIAGNOSIS — I10 Essential (primary) hypertension: Secondary | ICD-10-CM | POA: Diagnosis not present

## 2022-09-23 DIAGNOSIS — H409 Unspecified glaucoma: Secondary | ICD-10-CM | POA: Diagnosis not present

## 2022-09-23 DIAGNOSIS — Z6834 Body mass index (BMI) 34.0-34.9, adult: Secondary | ICD-10-CM | POA: Diagnosis not present

## 2022-09-23 DIAGNOSIS — Z79899 Other long term (current) drug therapy: Secondary | ICD-10-CM | POA: Diagnosis not present

## 2022-09-23 DIAGNOSIS — K589 Irritable bowel syndrome without diarrhea: Secondary | ICD-10-CM | POA: Diagnosis not present

## 2022-09-23 DIAGNOSIS — Z7952 Long term (current) use of systemic steroids: Secondary | ICD-10-CM | POA: Insufficient documentation

## 2022-09-23 MED ORDER — DEXAMETHASONE 2 MG PO TABS: 2.0000 mg | ORAL_TABLET | Freq: Every day | ORAL | 1 refills | Status: DC

## 2022-09-23 NOTE — Consult Note (Signed)
NEW PATIENT EVALUATION  Name: Vickie Stewart  MRN: [id number]  Date:   09/23/2022     DOB: [birth year]   This 77 y.o. female patient presents to the clinic for initial evaluation of meningioma.  REFERRING PHYSICIAN: Alba Cory, MD  CHIEF COMPLAINT:  Chief Complaint  Patient presents with   meningeoma    DIAGNOSIS: The encounter diagnosis was Meningioma.   PREVIOUS INVESTIGATIONS:  Clinical notes reviewed MRI scans reviewed  HPI: Patient is a 77 year old female seen f today for evaluation of a meningioma of the right parafalcine region .  The meningioma measures 2.3 x 1.4 x 2.4 cm and is slightly increased in size from prior MRI of scan of 04/22/2021.  There is some local mass effect upon the underlying right frontal lobe with mild underlying parenchymal edema.  The patient has been having some decree sensation in the left side of her body without specific weakness no focal neurologic deficits are appreciated.  She has been having increased headaches and some intermittent dizziness.  She has met with neurosurgeon and is now referred to ration collagen for consideration of treatment.  PLANNED TREATMENT REGIMEN: IMRT radiation therapy  PAST MEDICAL HISTORY:  has a past medical history of Allergic rhinitis, cause unspecified, Anxiety state, unspecified, Contact dermatitis and other eczema, due to unspecified cause, Diffuse cystic mastopathy, Dyspepsia and other specified disorders of function of stomach, Essential hypertension, benign, Glaucoma, Irritable bowel syndrome, Lateral epicondylitis  of elbow, Leukocytopenia, unspecified, Mastodynia, Obesity, unspecified, Pure hypercholesterolemia, Reflux esophagitis, Thoracic or lumbosacral neuritis or radiculitis, unspecified, Unspecified disorder of skin and subcutaneous tissue, and Vaginal cancer.    PAST SURGICAL HISTORY:  Past Surgical History:  Procedure Laterality Date   BREAST EXCISIONAL BIOPSY Left    neg   BREAST  LUMPECTOMY Left    COLONOSCOPY  08/26/2006   COLONOSCOPY WITH PROPOFOL N/A 10/21/2016   Procedure: COLONOSCOPY WITH PROPOFOL;  Surgeon: Kieth Brightly, MD;  Location: [facility] ENDOSCOPY;  Service: Endoscopy;  Laterality: N/A;   ESOPHAGOGASTRODUODENOSCOPY (EGD) WITH PROPOFOL N/A 02/07/2020   Procedure: ESOPHAGOGASTRODUODENOSCOPY (EGD) WITH PROPOFOL;  Surgeon: Toney Reil, MD;  Location: [facility] ENDOSCOPY;  Service: Gastroenterology;  Laterality: N/A;    FAMILY HISTORY: family history includes Aortic stenosis in her father; Hypertension in her father; Pulmonary embolism in her mother.  SOCIAL HISTORY:  reports that she has never smoked. She has never used smokeless tobacco. She reports that she does not drink alcohol and does not use drugs.  ALLERGIES: Atorvastatin, Terbinafine and related, and Zetia [ezetimibe]  MEDICATIONS:  Current Outpatient Medications  Medication Sig Dispense Refill   aspirin 81 MG tablet Take 81 mg by mouth daily. Reported on 10/10/2015     celecoxib (CELEBREX) 100 MG capsule Take 1 capsule (100 mg total) by mouth daily. 90 capsule 0   cholecalciferol (VITAMIN D) 1000 units tablet Take 1,000 Units by mouth daily.     dexamethasone (DECADRON) 2 MG tablet Take 1 tablet (2 mg total) by mouth daily. 45 tablet 1   diltiazem (CARDIZEM CD) 360 MG 24 hr capsule TAKE 1 CAPSULE BY MOUTH EVERY DAY 90 capsule 1   latanoprost (XALATAN) 0.005 % ophthalmic solution PLACE 1 DROP IN BOTH EYES AT BEDTIME  4   magnesium oxide (MAG-OX) 400 (240 Mg) MG tablet Take 400 mg by mouth daily.     olmesartan (BENICAR) 20 MG tablet Take 1 tablet (20 mg total) by mouth daily. 90 tablet 1   Omega-3 Fatty Acids (FISH OIL CONCENTRATE PO) Take by  mouth. Reported on 10/10/2015     omeprazole (PRILOSEC) 20 MG capsule Take 1 capsule (20 mg total) by mouth in the morning and at bedtime. 180 capsule 1   rosuvastatin (CRESTOR) 10 MG tablet Take 1 tablet (10 mg total) by mouth daily. 90 tablet 3    triamcinolone cream (KENALOG) 0.1 % Apply topically 2 (two) times daily. 453.6 g 0   No current facility-administered medications for this encounter.    ECOG PERFORMANCE STATUS:  1 - Symptomatic but completely ambulatory  REVIEW OF SYSTEMS: Patient denies any weight loss, fatigue, weakness, fever, chills or night sweats. Patient denies any loss of vision, blurred vision. Patient denies any ringing  of the ears or hearing loss. No irregular heartbeat. Patient denies heart murmur or history of fainting. Patient denies any chest pain or pain radiating to her upper extremities. Patient denies any shortness of breath, difficulty breathing at night, cough or hemoptysis. Patient denies any swelling in the lower legs. Patient denies any nausea vomiting, vomiting of blood, or coffee ground material in the vomitus. Patient denies any stomach pain. Patient states has had normal bowel movements no significant constipation or diarrhea. Patient denies any dysuria, hematuria or significant nocturia. Patient denies any problems walking, swelling in the joints or loss of balance. Patient denies any skin changes, loss of hair or loss of weight. Patient denies any excessive worrying or anxiety or significant depression. Patient denies any problems with insomnia. Patient denies excessive thirst, polyuria, polydipsia. Patient denies any swollen glands, patient denies easy bruising or easy bleeding. Patient denies any recent infections, allergies or URI. Patient "s visual fields have not changed significantly in recent time.   PHYSICAL EXAM: BP 136/72 (BP Location: Left Arm, Patient Position: Sitting, Cuff Size: Normal)   Pulse (!) 54   Temp 97.6 F (36.4 C) (Tympanic)   Ht 5\' 1"  (1.549 m) Comment: stated ht  Wt 181 lb (82.1 kg)   BMI 34.20 kg/m  Crude visual fields within normal range motor sensory and DTR levels are equal and symmetric in upper lower extremities.  Proprioception is intact.  Well-developed  well-nourished patient in NAD. HEENT reveals PERLA, EOMI, discs not visualized.  Oral cavity is clear. No oral mucosal lesions are identified. Neck is clear without evidence of cervical or supraclavicular adenopathy. Lungs are clear to A&P. Cardiac examination is essentially unremarkable with regular rate and rhythm without murmur rub or thrill. Abdomen is benign with no organomegaly or masses noted. Motor sensory and DTR levels are equal and symmetric in the upper and lower extremities. Cranial nerves II through XII are grossly intact. Proprioception is intact. No peripheral adenopathy or edema is identified. No motor or sensory levels are noted. Crude visual fields are within normal range.  LABORATORY DATA: Labs reviewed    RADIOLOGY RESULTS: MRI scans reviewed compatible with above-stated findings   IMPRESSION: Meningioma of the right aspect of the mid to posterior falx dural based in 77 year old female  PLAN: At this time elected to head with IMRT radiation therapy to the meningioma.  Would plan on delivering 54 Gray in 30 fractions.  I would choose IMRT to spare critical structures such as her cranial nerves normal brain volume optic region.  Risks and benefits of treatment including potential hair loss fatigue alteration of blood counts all were reviewed with the patient in detail.  Will use MRI fusion study for treatment planning.  Patient comprehensive recommendations well I personally set up and ordered CTsimulation next week.  I would like  to take this opportunity to thank you for allowing me to participate in the care of your patient.Carmina Miller, MD

## 2022-09-25 NOTE — Progress Notes (Unsigned)
Name: Vickie Stewart   MRN: [id number]    DOB: 16-Apr-1946   Date:09/28/2022       Progress Note  Subjective  Chief Complaint  Breast Pain  HPI  Left side chest pain: she states she was having pain below her left breast and also substernal , described as soreness and it was not associated with sob, chest tightness, diaphoresis or fatigue. She was drinking a lot of orange juice and eating oranges and had also gone down on PPI to once a day about one month before symptoms started, she states since she stopped drinking orange juice and increased PPI to twice daily and symptoms improved. No breast lumps.   Meningioma: showed growth, Dr Marcell Barlow discussed radiation versus surgery and she decided to have radiation. She asked me questions today.   Dyslipidemia: we will recheck labs today  Hyperglycemia : we will recheck A1C  Neutropenia: chronic and likely hereditary, we will recheck level  Patient Active Problem List   Diagnosis Date Noted   ASCUS of cervix with negative high risk HPV 01/07/2022   Occipital neuralgia of left side 01/07/2022   Balance problems 01/07/2022   Vertigo 01/07/2022   Pulsatile tinnitus of right ear 06/18/2020   Pulsatile neck mass 06/18/2020   OSA (obstructive sleep apnea) 09/19/2019   Anxiety and depression 09/19/2019   Other neutropenia (HCC) 08/11/2019   Left thyroid nodule 08/08/2019   Chronic neck and back pain 01/03/2019   Meningioma (HCC) 10/04/2017   Lichen sclerosus 03/24/2016   Vaginal atrophy 03/18/2016   Encounter for follow-up surveillance of vaginal cancer 03/18/2016   Eczema 10/31/2015   Left hand pain 09/10/2015   Osteopenia after menopause 07/26/2015   Menopause 07/26/2015   Irritable colon 07/26/2015   History of cancer of vagina 07/26/2015   Diffuse cystic mastopathy 07/26/2015   Obesity (BMI 30.0-34.9) 07/26/2015   Intermittent low back pain 07/26/2015   Hyperlipemia 07/26/2015   Osteoarthritis 05/13/2015   Allergic  rhinitis 05/13/2015   Gastroesophageal reflux disease without esophagitis 05/13/2015   Essential hypertension, benign     Past Surgical History:  Procedure Laterality Date   BREAST EXCISIONAL BIOPSY Left    neg   BREAST LUMPECTOMY Left    COLONOSCOPY  08/26/2006   COLONOSCOPY WITH PROPOFOL N/A 10/21/2016   Procedure: COLONOSCOPY WITH PROPOFOL;  Surgeon: Kieth Brightly, MD;  Location: [facility] ENDOSCOPY;  Service: Endoscopy;  Laterality: N/A;   ESOPHAGOGASTRODUODENOSCOPY (EGD) WITH PROPOFOL N/A 02/07/2020   Procedure: ESOPHAGOGASTRODUODENOSCOPY (EGD) WITH PROPOFOL;  Surgeon: Toney Reil, MD;  Location: [facility] ENDOSCOPY;  Service: Gastroenterology;  Laterality: N/A;    Family History  Problem Relation Age of Onset   Pulmonary embolism Mother    Hypertension Father    Aortic stenosis Father    Breast cancer Neg Hx     Social History   Tobacco Use   Smoking status: Never   Smokeless tobacco: Never   Tobacco comments:    experimented with dip snuff as a child  Substance Use Topics   Alcohol use: No     Current Outpatient Medications:    aspirin 81 MG tablet, Take 81 mg by mouth daily. Reported on 10/10/2015, Disp: , Rfl:    celecoxib (CELEBREX) 100 MG capsule, Take 1 capsule (100 mg total) by mouth daily., Disp: 90 capsule, Rfl: 0   cholecalciferol (VITAMIN D) 1000 units tablet, Take 1,000 Units by mouth daily., Disp: , Rfl:    dexamethasone (DECADRON) 2 MG tablet, Take 1 tablet (2 mg total)  by mouth daily., Disp: 45 tablet, Rfl: 1   diltiazem (CARDIZEM CD) 360 MG 24 hr capsule, TAKE 1 CAPSULE BY MOUTH EVERY DAY, Disp: 90 capsule, Rfl: 1   latanoprost (XALATAN) 0.005 % ophthalmic solution, PLACE 1 DROP IN BOTH EYES AT BEDTIME, Disp: , Rfl: 4   magnesium oxide (MAG-OX) 400 (240 Mg) MG tablet, Take 400 mg by mouth daily., Disp: , Rfl:    olmesartan (BENICAR) 20 MG tablet, Take 1 tablet (20 mg total) by mouth daily., Disp: 90 tablet, Rfl: 1   Omega-3 Fatty Acids (FISH OIL  CONCENTRATE PO), Take by mouth. Reported on 10/10/2015, Disp: , Rfl:    omeprazole (PRILOSEC) 20 MG capsule, Take 1 capsule (20 mg total) by mouth in the morning and at bedtime., Disp: 180 capsule, Rfl: 1   rosuvastatin (CRESTOR) 10 MG tablet, Take 1 tablet (10 mg total) by mouth daily., Disp: 90 tablet, Rfl: 3   triamcinolone cream (KENALOG) 0.1 %, Apply topically 2 (two) times daily., Disp: 453.6 g, Rfl: 0  Allergies  Allergen Reactions   Atorvastatin     difficulty concentrating and focusing   Terbinafine And Related Dermatitis    Rash, itch, skin discoloration   Zetia [Ezetimibe] Other (See Comments)    Muscle/joint pain    I personally reviewed active problem list, medication list, allergies, family history, social history, health maintenance with the patient/caregiver today.   ROS  Ten systems reviewed and is negative except as mentioned in HPI   Objective  Vitals:   09/28/22 1054  BP: 136/68  Pulse: 63  Resp: 16  SpO2: 98%  Weight: 180 lb (81.6 kg)  Height: 5\' 1"  (1.549 m)    Body mass index is 34.01 kg/m.  Physical Exam  Constitutional: Patient appears well-developed and well-nourished. Obese  No distress.  HEENT: head atraumatic, normocephalic, pupils equal and reactive to light, neck supple Cardiovascular: Normal rate, regular rhythm and normal heart sounds.  No murmur heard. No BLE edema. Pulmonary/Chest: Effort normal and breath sounds normal. No respiratory distress. Abdominal: Soft.  There is no tenderness. Neuro: no focal deficit  Psychiatric: Patient has a normal mood and affect. behavior is normal. Judgment and thought content normal.    PHQ2/9:    09/28/2022   10:54 AM 07/10/2022   10:41 AM 01/07/2022   10:34 AM 10/28/2021    2:29 PM 09/03/2021    1:52 PM  Depression screen PHQ 2/9  Decreased Interest 0 0 0 0 0  Down, Depressed, Hopeless 0 0 0 0 0  PHQ - 2 Score 0 0 0 0 0  Altered sleeping 0 0  0 0  Tired, decreased energy 0 0  0 0  Change in  appetite 0 0  0 0  Feeling bad or failure about yourself  0 0  0 0  Trouble concentrating 0 0  0 0  Moving slowly or fidgety/restless 0 0  0 0  Suicidal thoughts 0 0  0 0  PHQ-9 Score 0 0  0 0  Difficult doing work/chores    Not difficult at all     phq 9 is negative   Fall Risk:    09/28/2022   10:53 AM 07/10/2022   10:40 AM 01/07/2022   10:34 AM 10/28/2021    2:27 PM 09/03/2021    1:52 PM  Fall Risk   Falls in the past year? 0 0 0 1 1  Number falls in past yr: 0  0 0 0  Injury with Fall?  0  0 1 1  Risk for fall due to : No Fall Risks No Fall Risks No Fall Risks    Follow up Falls prevention discussed Falls prevention discussed;Education provided;Falls evaluation completed Falls prevention discussed  Falls prevention discussed      Functional Status Survey: Is the patient deaf or have difficulty hearing?: No Does the patient have difficulty seeing, even when wearing glasses/contacts?: No Does the patient have difficulty concentrating, remembering, or making decisions?: No Does the patient have difficulty walking or climbing stairs?: No Does the patient have difficulty dressing or bathing?: No Does the patient have difficulty doing errands alone such as visiting a doctor's office or shopping?: No    Assessment & Plan  1. Meningioma [facility])  She will start radiation soon   2. Hyperglycemia  - Hemoglobin A1c  3. Pure hypercholesterolemia  - Lipid panel  4. Other neutropenia (HCC)  - CBC with Differential/Platelet  5. Long-term use of high-risk medication  - COMPLETE METABOLIC PANEL WITH GFR  6. Gastroesophageal reflux disease without esophagitis  Doing better since she resumed BID dose of PPI   7. Left-sided chest pain  Discussed symptoms of chest pain associated with heart disease and when to call 911

## 2022-09-28 ENCOUNTER — Telehealth: Payer: Self-pay | Admitting: Family Medicine

## 2022-09-28 ENCOUNTER — Encounter: Payer: Self-pay | Admitting: Family Medicine

## 2022-09-28 ENCOUNTER — Ambulatory Visit: Payer: Medicare PPO | Admitting: Family Medicine

## 2022-09-28 VITALS — BP 136/68 | HR 63 | Resp 16 | Ht 61.0 in | Wt 180.0 lb

## 2022-09-28 DIAGNOSIS — R739 Hyperglycemia, unspecified: Secondary | ICD-10-CM

## 2022-09-28 DIAGNOSIS — D708 Other neutropenia: Secondary | ICD-10-CM

## 2022-09-28 DIAGNOSIS — D329 Benign neoplasm of meninges, unspecified: Secondary | ICD-10-CM

## 2022-09-28 DIAGNOSIS — K219 Gastro-esophageal reflux disease without esophagitis: Secondary | ICD-10-CM

## 2022-09-28 DIAGNOSIS — E7800 Pure hypercholesterolemia, unspecified: Secondary | ICD-10-CM

## 2022-09-28 DIAGNOSIS — Z79899 Other long term (current) drug therapy: Secondary | ICD-10-CM

## 2022-09-28 DIAGNOSIS — R079 Chest pain, unspecified: Secondary | ICD-10-CM

## 2022-09-28 NOTE — Telephone Encounter (Signed)
Lvm informing pt that dr Carlynn Purl does prefer her to do a physical but it was completely up to her. Dr Carlynn Purl stated that her appointment today was totally different than a physical.

## 2022-09-28 NOTE — Telephone Encounter (Signed)
I was trying to schedule pt her annual cpe but pt wanted me to check with you. Stated that she did her blood work today and breast exam. She is wanting to know if she still need to schedule the cpe

## 2022-09-29 LAB — LIPID PANEL
Cholesterol: 124 mg/dL (ref <200)
HDL: 50 mg/dL (ref >=50)
LDL Cholesterol (Calc): 55 mg/dL (calc)
Non-HDL Cholesterol (Calc): 74 mg/dL (calc) (ref <130)
Total CHOL/HDL Ratio: 2.5 (calc) (ref <5.0)
Triglycerides: 102 mg/dL (ref <150)

## 2022-09-29 LAB — COMPLETE METABOLIC PANEL WITH GFR
AG Ratio: 2 (calc) (ref 1.0–2.5)
ALT: 23 U/L (ref 6–29)
AST: 21 U/L (ref 10–35)
Albumin: 4.5 g/dL (ref 3.6–5.1)
Alkaline phosphatase (APISO): 47 U/L (ref 37–153)
BUN: 11 mg/dL (ref 7–25)
CO2: 30 mmol/L (ref 20–32)
Calcium: 9.6 mg/dL (ref 8.6–10.4)
Chloride: 104 mmol/L (ref 98–110)
Creat: 0.69 mg/dL (ref 0.60–1.00)
Globulin: 2.3 g/dL (calc) (ref 1.9–3.7)
Glucose, Bld: 90 mg/dL (ref 65–99)
Potassium: 4.3 mmol/L (ref 3.5–5.3)
Sodium: 140 mmol/L (ref 135–146)
Total Bilirubin: 0.4 mg/dL (ref 0.2–1.2)
Total Protein: 6.8 g/dL (ref 6.1–8.1)
eGFR: 90 mL/min/{1.73_m2} (ref >=60)

## 2022-09-29 LAB — CBC WITH DIFFERENTIAL/PLATELET
Absolute Monocytes: 288 cells/uL (ref 200–950)
Basophils Absolute: 61 cells/uL (ref 0–200)
Basophils Relative: 1.9 %
Eosinophils Absolute: 99 cells/uL (ref 15–500)
Eosinophils Relative: 3.1 %
HCT: 43.2 % (ref 35.0–45.0)
Hemoglobin: 14.3 g/dL (ref 11.7–15.5)
Lymphs Abs: 1715 cells/uL (ref 850–3900)
MCH: 29.3 pg (ref 27.0–33.0)
MCHC: 33.1 g/dL (ref 32.0–36.0)
MCV: 88.5 fL (ref 80.0–100.0)
MPV: 11.3 fL (ref 7.5–12.5)
Monocytes Relative: 9 %
Neutro Abs: 1037 cells/uL — ABNORMAL LOW (ref 1500–7800)
Neutrophils Relative %: 32.4 %
Platelets: 253 10*3/uL (ref 140–400)
RBC: 4.88 10*6/uL (ref 3.80–5.10)
RDW: 12.7 % (ref 11.0–15.0)
Total Lymphocyte: 53.6 %
WBC: 3.2 10*3/uL — ABNORMAL LOW (ref 3.8–10.8)

## 2022-09-29 LAB — HEMOGLOBIN A1C
Hgb A1c MFr Bld: 5.9 % of total Hgb — ABNORMAL HIGH (ref <5.7)
Mean Plasma Glucose: 123 mg/dL
eAG (mmol/L): 6.8 mmol/L

## 2022-09-30 ENCOUNTER — Ambulatory Visit
Admission: RE | Admit: 2022-09-30 | Discharge: 2022-09-30 | Disposition: A | Discharge status: Home / Self Care | Payer: Medicare PPO | Source: Ambulatory Visit | Attending: Radiation Oncology | Admitting: Radiation Oncology

## 2022-09-30 DIAGNOSIS — Z510 Encounter for antineoplastic radiation therapy: Secondary | ICD-10-CM | POA: Insufficient documentation

## 2022-09-30 DIAGNOSIS — D420 Neoplasm of uncertain behavior of cerebral meninges: Secondary | ICD-10-CM | POA: Diagnosis not present

## 2022-09-30 DIAGNOSIS — D320 Benign neoplasm of cerebral meninges: Secondary | ICD-10-CM | POA: Diagnosis not present

## 2022-09-30 DIAGNOSIS — D329 Benign neoplasm of meninges, unspecified: Secondary | ICD-10-CM | POA: Insufficient documentation

## 2022-10-01 DIAGNOSIS — D329 Benign neoplasm of meninges, unspecified: Secondary | ICD-10-CM | POA: Diagnosis not present

## 2022-10-01 DIAGNOSIS — Z510 Encounter for antineoplastic radiation therapy: Secondary | ICD-10-CM | POA: Diagnosis not present

## 2022-10-01 DIAGNOSIS — D420 Neoplasm of uncertain behavior of cerebral meninges: Secondary | ICD-10-CM | POA: Diagnosis not present

## 2022-10-02 ENCOUNTER — Other Ambulatory Visit: Payer: Self-pay | Admitting: *Deleted

## 2022-10-02 DIAGNOSIS — D329 Benign neoplasm of meninges, unspecified: Secondary | ICD-10-CM

## 2022-10-08 ENCOUNTER — Ambulatory Visit: Admission: RE | Admit: 2022-10-08 | Payer: Medicare PPO | Source: Ambulatory Visit

## 2022-10-12 ENCOUNTER — Other Ambulatory Visit: Payer: Self-pay | Admitting: Family Medicine

## 2022-10-12 ENCOUNTER — Encounter: Payer: Self-pay | Admitting: Family Medicine

## 2022-10-12 ENCOUNTER — Other Ambulatory Visit: Payer: Self-pay

## 2022-10-12 ENCOUNTER — Other Ambulatory Visit: Payer: Self-pay | Admitting: *Deleted

## 2022-10-12 ENCOUNTER — Ambulatory Visit
Admission: RE | Admit: 2022-10-12 | Discharge: 2022-10-12 | Disposition: A | Discharge status: Home / Self Care | Payer: Medicare PPO | Source: Ambulatory Visit | Attending: Radiation Oncology | Admitting: Radiation Oncology

## 2022-10-12 DIAGNOSIS — Z510 Encounter for antineoplastic radiation therapy: Secondary | ICD-10-CM | POA: Diagnosis not present

## 2022-10-12 DIAGNOSIS — D329 Benign neoplasm of meninges, unspecified: Secondary | ICD-10-CM | POA: Diagnosis not present

## 2022-10-12 DIAGNOSIS — Z1231 Encounter for screening mammogram for malignant neoplasm of breast: Secondary | ICD-10-CM

## 2022-10-12 LAB — RAD ONC ARIA SESSION SUMMARY
Course Elapsed Days: 0
Plan Fractions Treated to Date: 1
Plan Prescribed Dose Per Fraction: 1.8 Gy
Plan Total Fractions Prescribed: 30
Plan Total Prescribed Dose: 54 Gy
Reference Point Dosage Given to Date: 1.8 Gy
Reference Point Session Dosage Given: 1.8 Gy
Session Number: 1

## 2022-10-12 MED ORDER — DEXAMETHASONE 2 MG PO TABS: 2.0000 mg | ORAL_TABLET | Freq: Two times a day (BID) | ORAL | 0 refills | Status: DC

## 2022-10-12 NOTE — Progress Notes (Signed)
Spoke with [name] about the [employer], she will be in tomorrow to sign and bring in proof of income.

## 2022-10-13 ENCOUNTER — Other Ambulatory Visit: Payer: Self-pay

## 2022-10-13 ENCOUNTER — Ambulatory Visit
Admission: RE | Admit: 2022-10-13 | Discharge: 2022-10-13 | Disposition: A | Discharge status: Home / Self Care | Payer: Medicare PPO | Source: Ambulatory Visit | Attending: Radiation Oncology | Admitting: Radiation Oncology

## 2022-10-13 DIAGNOSIS — Z510 Encounter for antineoplastic radiation therapy: Secondary | ICD-10-CM | POA: Diagnosis not present

## 2022-10-13 LAB — RAD ONC ARIA SESSION SUMMARY
Course Elapsed Days: 1
Plan Fractions Treated to Date: 2
Plan Prescribed Dose Per Fraction: 1.8 Gy
Plan Total Fractions Prescribed: 30
Plan Total Prescribed Dose: 54 Gy
Reference Point Dosage Given to Date: 3.6 Gy
Reference Point Session Dosage Given: 1.8 Gy
Session Number: 2

## 2022-10-13 NOTE — Progress Notes (Signed)
Enrolled Vickie Stewart into the [facility]

## 2022-10-14 ENCOUNTER — Ambulatory Visit
Admission: RE | Admit: 2022-10-14 | Discharge: 2022-10-14 | Disposition: A | Discharge status: Home / Self Care | Payer: Medicare PPO | Source: Ambulatory Visit | Attending: Radiation Oncology | Admitting: Radiation Oncology

## 2022-10-14 ENCOUNTER — Other Ambulatory Visit: Payer: Self-pay

## 2022-10-14 DIAGNOSIS — D329 Benign neoplasm of meninges, unspecified: Secondary | ICD-10-CM | POA: Diagnosis not present

## 2022-10-14 DIAGNOSIS — Z510 Encounter for antineoplastic radiation therapy: Secondary | ICD-10-CM | POA: Diagnosis not present

## 2022-10-14 LAB — RAD ONC ARIA SESSION SUMMARY
Course Elapsed Days: 2
Plan Fractions Treated to Date: 3
Plan Prescribed Dose Per Fraction: 1.8 Gy
Plan Total Fractions Prescribed: 30
Plan Total Prescribed Dose: 54 Gy
Reference Point Dosage Given to Date: 5.4 Gy
Reference Point Session Dosage Given: 1.8 Gy
Session Number: 3

## 2022-10-15 ENCOUNTER — Ambulatory Visit
Admission: RE | Admit: 2022-10-15 | Discharge: 2022-10-15 | Disposition: A | Discharge status: Home / Self Care | Payer: Medicare PPO | Source: Ambulatory Visit | Attending: Radiation Oncology | Admitting: Radiation Oncology

## 2022-10-15 ENCOUNTER — Inpatient Hospital Stay: Payer: Medicare PPO

## 2022-10-15 ENCOUNTER — Other Ambulatory Visit: Payer: Self-pay

## 2022-10-15 DIAGNOSIS — D329 Benign neoplasm of meninges, unspecified: Secondary | ICD-10-CM | POA: Insufficient documentation

## 2022-10-15 DIAGNOSIS — Z510 Encounter for antineoplastic radiation therapy: Secondary | ICD-10-CM | POA: Diagnosis not present

## 2022-10-15 DIAGNOSIS — D420 Neoplasm of uncertain behavior of cerebral meninges: Secondary | ICD-10-CM | POA: Diagnosis not present

## 2022-10-15 LAB — RAD ONC ARIA SESSION SUMMARY
Course Elapsed Days: 3
Plan Fractions Treated to Date: 4
Plan Prescribed Dose Per Fraction: 1.8 Gy
Plan Total Fractions Prescribed: 30
Plan Total Prescribed Dose: 54 Gy
Reference Point Dosage Given to Date: 7.2 Gy
Reference Point Session Dosage Given: 1.8 Gy
Session Number: 4

## 2022-10-15 LAB — CBC (CANCER CENTER ONLY)
HCT: 44 % (ref 36.0–46.0)
Hemoglobin: 14.6 g/dL (ref 12.0–15.0)
MCH: 29 pg (ref 26.0–34.0)
MCHC: 33.2 g/dL (ref 30.0–36.0)
MCV: 87.3 fL (ref 80.0–100.0)
Platelet Count: 266 10*3/uL (ref 150–400)
RBC: 5.04 MIL/uL (ref 3.87–5.11)
RDW: 13.2 % (ref 11.5–15.5)
WBC Count: 9 10*3/uL (ref 4.0–10.5)
nRBC: 0 % (ref 0.0–0.2)

## 2022-10-16 ENCOUNTER — Other Ambulatory Visit: Payer: Self-pay

## 2022-10-16 ENCOUNTER — Ambulatory Visit
Admission: RE | Admit: 2022-10-16 | Discharge: 2022-10-16 | Disposition: A | Discharge status: Home / Self Care | Payer: Medicare PPO | Source: Ambulatory Visit | Attending: Radiation Oncology | Admitting: Radiation Oncology

## 2022-10-16 DIAGNOSIS — D329 Benign neoplasm of meninges, unspecified: Secondary | ICD-10-CM | POA: Diagnosis not present

## 2022-10-16 DIAGNOSIS — D420 Neoplasm of uncertain behavior of cerebral meninges: Secondary | ICD-10-CM | POA: Diagnosis not present

## 2022-10-16 DIAGNOSIS — Z510 Encounter for antineoplastic radiation therapy: Secondary | ICD-10-CM | POA: Diagnosis not present

## 2022-10-16 LAB — RAD ONC ARIA SESSION SUMMARY
Course Elapsed Days: 4
Plan Fractions Treated to Date: 5
Plan Prescribed Dose Per Fraction: 1.8 Gy
Plan Total Fractions Prescribed: 30
Plan Total Prescribed Dose: 54 Gy
Reference Point Dosage Given to Date: 9 Gy
Reference Point Session Dosage Given: 1.8 Gy
Session Number: 5

## 2022-10-19 ENCOUNTER — Ambulatory Visit
Admission: RE | Admit: 2022-10-19 | Discharge: 2022-10-19 | Disposition: A | Discharge status: Home / Self Care | Payer: Medicare PPO | Source: Ambulatory Visit | Attending: Radiation Oncology | Admitting: Radiation Oncology

## 2022-10-19 ENCOUNTER — Other Ambulatory Visit: Payer: Self-pay

## 2022-10-19 DIAGNOSIS — Z510 Encounter for antineoplastic radiation therapy: Secondary | ICD-10-CM | POA: Diagnosis not present

## 2022-10-19 DIAGNOSIS — D329 Benign neoplasm of meninges, unspecified: Secondary | ICD-10-CM | POA: Diagnosis not present

## 2022-10-19 LAB — RAD ONC ARIA SESSION SUMMARY
Course Elapsed Days: 7
Plan Fractions Treated to Date: 6
Plan Prescribed Dose Per Fraction: 1.8 Gy
Plan Total Fractions Prescribed: 30
Plan Total Prescribed Dose: 54 Gy
Reference Point Dosage Given to Date: 10.8 Gy
Reference Point Session Dosage Given: 1.8 Gy
Session Number: 6

## 2022-10-20 ENCOUNTER — Ambulatory Visit
Admission: RE | Admit: 2022-10-20 | Discharge: 2022-10-20 | Disposition: A | Discharge status: Home / Self Care | Payer: Medicare PPO | Source: Ambulatory Visit | Attending: Radiation Oncology | Admitting: Radiation Oncology

## 2022-10-20 ENCOUNTER — Other Ambulatory Visit: Payer: Self-pay

## 2022-10-20 DIAGNOSIS — Z510 Encounter for antineoplastic radiation therapy: Secondary | ICD-10-CM | POA: Diagnosis not present

## 2022-10-20 LAB — RAD ONC ARIA SESSION SUMMARY
Course Elapsed Days: 8
Plan Fractions Treated to Date: 7
Plan Prescribed Dose Per Fraction: 1.8 Gy
Plan Total Fractions Prescribed: 30
Plan Total Prescribed Dose: 54 Gy
Reference Point Dosage Given to Date: 12.6 Gy
Reference Point Session Dosage Given: 1.8 Gy
Session Number: 7

## 2022-10-21 ENCOUNTER — Ambulatory Visit
Admission: RE | Admit: 2022-10-21 | Discharge: 2022-10-21 | Disposition: A | Discharge status: Home / Self Care | Payer: Medicare PPO | Source: Ambulatory Visit | Attending: Radiation Oncology | Admitting: Radiation Oncology

## 2022-10-21 ENCOUNTER — Other Ambulatory Visit: Payer: Self-pay

## 2022-10-21 DIAGNOSIS — D329 Benign neoplasm of meninges, unspecified: Secondary | ICD-10-CM | POA: Diagnosis not present

## 2022-10-21 DIAGNOSIS — Z510 Encounter for antineoplastic radiation therapy: Secondary | ICD-10-CM | POA: Diagnosis not present

## 2022-10-21 LAB — RAD ONC ARIA SESSION SUMMARY
Course Elapsed Days: 9
Plan Fractions Treated to Date: 8
Plan Prescribed Dose Per Fraction: 1.8 Gy
Plan Total Fractions Prescribed: 30
Plan Total Prescribed Dose: 54 Gy
Reference Point Dosage Given to Date: 14.4 Gy
Reference Point Session Dosage Given: 1.8 Gy
Session Number: 8

## 2022-10-22 ENCOUNTER — Other Ambulatory Visit: Payer: Self-pay

## 2022-10-22 ENCOUNTER — Ambulatory Visit
Admission: RE | Admit: 2022-10-22 | Discharge: 2022-10-22 | Disposition: A | Discharge status: Home / Self Care | Payer: Medicare PPO | Source: Ambulatory Visit | Attending: Radiation Oncology | Admitting: Radiation Oncology

## 2022-10-22 ENCOUNTER — Inpatient Hospital Stay: Payer: Medicare PPO

## 2022-10-22 DIAGNOSIS — D420 Neoplasm of uncertain behavior of cerebral meninges: Secondary | ICD-10-CM | POA: Diagnosis not present

## 2022-10-22 DIAGNOSIS — Z510 Encounter for antineoplastic radiation therapy: Secondary | ICD-10-CM | POA: Diagnosis not present

## 2022-10-22 DIAGNOSIS — D329 Benign neoplasm of meninges, unspecified: Secondary | ICD-10-CM | POA: Diagnosis not present

## 2022-10-22 LAB — RAD ONC ARIA SESSION SUMMARY
Course Elapsed Days: 10
Plan Fractions Treated to Date: 9
Plan Prescribed Dose Per Fraction: 1.8 Gy
Plan Total Fractions Prescribed: 30
Plan Total Prescribed Dose: 54 Gy
Reference Point Dosage Given to Date: 16.2 Gy
Reference Point Session Dosage Given: 1.8 Gy
Session Number: 9

## 2022-10-22 LAB — CBC (CANCER CENTER ONLY)
HCT: 45.1 % (ref 36.0–46.0)
Hemoglobin: 15.1 g/dL — ABNORMAL HIGH (ref 12.0–15.0)
MCH: 29.2 pg (ref 26.0–34.0)
MCHC: 33.5 g/dL (ref 30.0–36.0)
MCV: 87.1 fL (ref 80.0–100.0)
Platelet Count: 302 10*3/uL (ref 150–400)
RBC: 5.18 MIL/uL — ABNORMAL HIGH (ref 3.87–5.11)
RDW: 13.2 % (ref 11.5–15.5)
WBC Count: 9.2 10*3/uL (ref 4.0–10.5)
nRBC: 0 % (ref 0.0–0.2)

## 2022-10-23 ENCOUNTER — Other Ambulatory Visit: Payer: Self-pay

## 2022-10-23 ENCOUNTER — Ambulatory Visit
Admission: RE | Admit: 2022-10-23 | Discharge: 2022-10-23 | Disposition: A | Discharge status: Home / Self Care | Payer: Medicare PPO | Source: Ambulatory Visit | Attending: Radiation Oncology | Admitting: Radiation Oncology

## 2022-10-23 DIAGNOSIS — D329 Benign neoplasm of meninges, unspecified: Secondary | ICD-10-CM | POA: Diagnosis not present

## 2022-10-23 DIAGNOSIS — D420 Neoplasm of uncertain behavior of cerebral meninges: Secondary | ICD-10-CM | POA: Diagnosis not present

## 2022-10-23 DIAGNOSIS — Z510 Encounter for antineoplastic radiation therapy: Secondary | ICD-10-CM | POA: Diagnosis not present

## 2022-10-23 LAB — RAD ONC ARIA SESSION SUMMARY
Course Elapsed Days: 11
Plan Fractions Treated to Date: 10
Plan Prescribed Dose Per Fraction: 1.8 Gy
Plan Total Fractions Prescribed: 30
Plan Total Prescribed Dose: 54 Gy
Reference Point Dosage Given to Date: 18 Gy
Reference Point Session Dosage Given: 1.8 Gy
Session Number: 10

## 2022-10-27 ENCOUNTER — Ambulatory Visit
Admission: RE | Admit: 2022-10-27 | Discharge: 2022-10-27 | Disposition: A | Discharge status: Home / Self Care | Payer: Medicare PPO | Source: Ambulatory Visit | Attending: Radiation Oncology | Admitting: Radiation Oncology

## 2022-10-27 ENCOUNTER — Other Ambulatory Visit: Payer: Self-pay

## 2022-10-27 DIAGNOSIS — D329 Benign neoplasm of meninges, unspecified: Secondary | ICD-10-CM | POA: Diagnosis not present

## 2022-10-27 DIAGNOSIS — Z510 Encounter for antineoplastic radiation therapy: Secondary | ICD-10-CM | POA: Diagnosis not present

## 2022-10-27 LAB — RAD ONC ARIA SESSION SUMMARY
Course Elapsed Days: 15
Plan Fractions Treated to Date: 11
Plan Prescribed Dose Per Fraction: 1.8 Gy
Plan Total Fractions Prescribed: 30
Plan Total Prescribed Dose: 54 Gy
Reference Point Dosage Given to Date: 19.8 Gy
Reference Point Session Dosage Given: 1.8 Gy
Session Number: 11

## 2022-10-28 ENCOUNTER — Ambulatory Visit
Admission: RE | Admit: 2022-10-28 | Discharge: 2022-10-28 | Disposition: A | Discharge status: Home / Self Care | Payer: Medicare PPO | Source: Ambulatory Visit | Attending: Radiation Oncology | Admitting: Radiation Oncology

## 2022-10-28 ENCOUNTER — Other Ambulatory Visit: Payer: Self-pay

## 2022-10-28 DIAGNOSIS — Z510 Encounter for antineoplastic radiation therapy: Secondary | ICD-10-CM | POA: Diagnosis not present

## 2022-10-28 LAB — RAD ONC ARIA SESSION SUMMARY
Course Elapsed Days: 16
Plan Fractions Treated to Date: 12
Plan Prescribed Dose Per Fraction: 1.8 Gy
Plan Total Fractions Prescribed: 30
Plan Total Prescribed Dose: 54 Gy
Reference Point Dosage Given to Date: 21.6 Gy
Reference Point Session Dosage Given: 1.8 Gy
Session Number: 12

## 2022-10-29 ENCOUNTER — Ambulatory Visit
Admission: RE | Admit: 2022-10-29 | Discharge: 2022-10-29 | Disposition: A | Discharge status: Home / Self Care | Payer: Medicare PPO | Source: Ambulatory Visit | Attending: Radiation Oncology | Admitting: Radiation Oncology

## 2022-10-29 ENCOUNTER — Other Ambulatory Visit: Payer: Self-pay

## 2022-10-29 ENCOUNTER — Inpatient Hospital Stay: Payer: Medicare PPO

## 2022-10-29 DIAGNOSIS — D420 Neoplasm of uncertain behavior of cerebral meninges: Secondary | ICD-10-CM | POA: Diagnosis not present

## 2022-10-29 DIAGNOSIS — D329 Benign neoplasm of meninges, unspecified: Secondary | ICD-10-CM | POA: Diagnosis not present

## 2022-10-29 DIAGNOSIS — Z510 Encounter for antineoplastic radiation therapy: Secondary | ICD-10-CM | POA: Diagnosis not present

## 2022-10-29 LAB — CBC (CANCER CENTER ONLY)
HCT: 45.1 % (ref 36.0–46.0)
Hemoglobin: 15.2 g/dL — ABNORMAL HIGH (ref 12.0–15.0)
MCH: 29.5 pg (ref 26.0–34.0)
MCHC: 33.7 g/dL (ref 30.0–36.0)
MCV: 87.4 fL (ref 80.0–100.0)
Platelet Count: 255 10*3/uL (ref 150–400)
RBC: 5.16 MIL/uL — ABNORMAL HIGH (ref 3.87–5.11)
RDW: 13.3 % (ref 11.5–15.5)
WBC Count: 8.6 10*3/uL (ref 4.0–10.5)
nRBC: 0 % (ref 0.0–0.2)

## 2022-10-29 LAB — RAD ONC ARIA SESSION SUMMARY
Course Elapsed Days: 17
Plan Fractions Treated to Date: 13
Plan Prescribed Dose Per Fraction: 1.8 Gy
Plan Total Fractions Prescribed: 30
Plan Total Prescribed Dose: 54 Gy
Reference Point Dosage Given to Date: 23.4 Gy
Reference Point Session Dosage Given: 1.8 Gy
Session Number: 13

## 2022-10-30 ENCOUNTER — Ambulatory Visit
Admission: RE | Admit: 2022-10-30 | Discharge: 2022-10-30 | Disposition: A | Discharge status: Home / Self Care | Payer: Medicare PPO | Source: Ambulatory Visit | Attending: Radiation Oncology | Admitting: Radiation Oncology

## 2022-10-30 ENCOUNTER — Other Ambulatory Visit: Payer: Self-pay

## 2022-10-30 DIAGNOSIS — D329 Benign neoplasm of meninges, unspecified: Secondary | ICD-10-CM | POA: Diagnosis not present

## 2022-10-30 DIAGNOSIS — Z510 Encounter for antineoplastic radiation therapy: Secondary | ICD-10-CM | POA: Diagnosis not present

## 2022-10-30 DIAGNOSIS — D320 Benign neoplasm of cerebral meninges: Secondary | ICD-10-CM | POA: Diagnosis not present

## 2022-10-30 LAB — RAD ONC ARIA SESSION SUMMARY
Course Elapsed Days: 18
Plan Fractions Treated to Date: 14
Plan Prescribed Dose Per Fraction: 1.8 Gy
Plan Total Fractions Prescribed: 30
Plan Total Prescribed Dose: 54 Gy
Reference Point Dosage Given to Date: 25.2 Gy
Reference Point Session Dosage Given: 1.8 Gy
Session Number: 14

## 2022-11-02 ENCOUNTER — Other Ambulatory Visit: Payer: Self-pay

## 2022-11-02 ENCOUNTER — Ambulatory Visit
Admission: RE | Admit: 2022-11-02 | Discharge: 2022-11-02 | Disposition: A | Discharge status: Home / Self Care | Payer: Medicare PPO | Source: Ambulatory Visit | Attending: Radiation Oncology | Admitting: Radiation Oncology

## 2022-11-02 DIAGNOSIS — D320 Benign neoplasm of cerebral meninges: Secondary | ICD-10-CM | POA: Diagnosis not present

## 2022-11-02 DIAGNOSIS — D329 Benign neoplasm of meninges, unspecified: Secondary | ICD-10-CM | POA: Diagnosis not present

## 2022-11-02 DIAGNOSIS — Z510 Encounter for antineoplastic radiation therapy: Secondary | ICD-10-CM | POA: Diagnosis not present

## 2022-11-02 DIAGNOSIS — D420 Neoplasm of uncertain behavior of cerebral meninges: Secondary | ICD-10-CM | POA: Insufficient documentation

## 2022-11-02 LAB — RAD ONC ARIA SESSION SUMMARY
Course Elapsed Days: 21
Plan Fractions Treated to Date: 15
Plan Prescribed Dose Per Fraction: 1.8 Gy
Plan Total Fractions Prescribed: 30
Plan Total Prescribed Dose: 54 Gy
Reference Point Dosage Given to Date: 27 Gy
Reference Point Session Dosage Given: 1.8 Gy
Session Number: 15

## 2022-11-03 ENCOUNTER — Other Ambulatory Visit: Payer: Self-pay

## 2022-11-03 ENCOUNTER — Ambulatory Visit
Admission: RE | Admit: 2022-11-03 | Discharge: 2022-11-03 | Disposition: A | Discharge status: Home / Self Care | Payer: Medicare PPO | Source: Ambulatory Visit | Attending: Radiation Oncology | Admitting: Radiation Oncology

## 2022-11-03 DIAGNOSIS — Z510 Encounter for antineoplastic radiation therapy: Secondary | ICD-10-CM | POA: Diagnosis not present

## 2022-11-03 DIAGNOSIS — D329 Benign neoplasm of meninges, unspecified: Secondary | ICD-10-CM | POA: Diagnosis not present

## 2022-11-03 DIAGNOSIS — D320 Benign neoplasm of cerebral meninges: Secondary | ICD-10-CM | POA: Diagnosis not present

## 2022-11-03 LAB — RAD ONC ARIA SESSION SUMMARY
Course Elapsed Days: 22
Plan Fractions Treated to Date: 16
Plan Prescribed Dose Per Fraction: 1.8 Gy
Plan Total Fractions Prescribed: 30
Plan Total Prescribed Dose: 54 Gy
Reference Point Dosage Given to Date: 28.8 Gy
Reference Point Session Dosage Given: 1.8 Gy
Session Number: 16

## 2022-11-04 ENCOUNTER — Ambulatory Visit
Admission: RE | Admit: 2022-11-04 | Discharge: 2022-11-04 | Disposition: A | Discharge status: Home / Self Care | Payer: Medicare PPO | Source: Ambulatory Visit | Attending: Radiation Oncology | Admitting: Radiation Oncology

## 2022-11-04 ENCOUNTER — Other Ambulatory Visit: Payer: Self-pay

## 2022-11-04 DIAGNOSIS — Z510 Encounter for antineoplastic radiation therapy: Secondary | ICD-10-CM | POA: Diagnosis not present

## 2022-11-04 DIAGNOSIS — D320 Benign neoplasm of cerebral meninges: Secondary | ICD-10-CM | POA: Diagnosis not present

## 2022-11-04 DIAGNOSIS — D329 Benign neoplasm of meninges, unspecified: Secondary | ICD-10-CM | POA: Diagnosis not present

## 2022-11-04 LAB — RAD ONC ARIA SESSION SUMMARY
Course Elapsed Days: 23
Plan Fractions Treated to Date: 17
Plan Prescribed Dose Per Fraction: 1.8 Gy
Plan Total Fractions Prescribed: 30
Plan Total Prescribed Dose: 54 Gy
Reference Point Dosage Given to Date: 30.6 Gy
Reference Point Session Dosage Given: 1.8 Gy
Session Number: 17

## 2022-11-05 ENCOUNTER — Inpatient Hospital Stay: Payer: Medicare PPO

## 2022-11-05 ENCOUNTER — Other Ambulatory Visit: Payer: Self-pay

## 2022-11-05 ENCOUNTER — Ambulatory Visit
Admission: RE | Admit: 2022-11-05 | Discharge: 2022-11-05 | Disposition: A | Discharge status: Home / Self Care | Payer: Medicare PPO | Source: Ambulatory Visit | Attending: Radiation Oncology | Admitting: Radiation Oncology

## 2022-11-05 DIAGNOSIS — D320 Benign neoplasm of cerebral meninges: Secondary | ICD-10-CM | POA: Diagnosis not present

## 2022-11-05 DIAGNOSIS — Z510 Encounter for antineoplastic radiation therapy: Secondary | ICD-10-CM | POA: Diagnosis not present

## 2022-11-05 DIAGNOSIS — D329 Benign neoplasm of meninges, unspecified: Secondary | ICD-10-CM | POA: Diagnosis not present

## 2022-11-05 LAB — RAD ONC ARIA SESSION SUMMARY
Course Elapsed Days: 24
Plan Fractions Treated to Date: 18
Plan Prescribed Dose Per Fraction: 1.8 Gy
Plan Total Fractions Prescribed: 30
Plan Total Prescribed Dose: 54 Gy
Reference Point Dosage Given to Date: 32.4 Gy
Reference Point Session Dosage Given: 1.8 Gy
Session Number: 18

## 2022-11-05 LAB — CBC (CANCER CENTER ONLY)
HCT: 44.5 % (ref 36.0–46.0)
Hemoglobin: 14.8 g/dL (ref 12.0–15.0)
MCH: 29.2 pg (ref 26.0–34.0)
MCHC: 33.3 g/dL (ref 30.0–36.0)
MCV: 87.8 fL (ref 80.0–100.0)
Platelet Count: 168 10*3/uL (ref 150–400)
RBC: 5.07 MIL/uL (ref 3.87–5.11)
RDW: 13.3 % (ref 11.5–15.5)
WBC Count: 8.5 10*3/uL (ref 4.0–10.5)
nRBC: 0 % (ref 0.0–0.2)

## 2022-11-06 ENCOUNTER — Other Ambulatory Visit: Payer: Self-pay

## 2022-11-06 ENCOUNTER — Ambulatory Visit
Admission: RE | Admit: 2022-11-06 | Discharge: 2022-11-06 | Disposition: A | Discharge status: Home / Self Care | Payer: Medicare PPO | Source: Ambulatory Visit | Attending: Radiation Oncology | Admitting: Radiation Oncology

## 2022-11-06 DIAGNOSIS — D320 Benign neoplasm of cerebral meninges: Secondary | ICD-10-CM | POA: Diagnosis not present

## 2022-11-06 DIAGNOSIS — Z510 Encounter for antineoplastic radiation therapy: Secondary | ICD-10-CM | POA: Diagnosis not present

## 2022-11-06 DIAGNOSIS — D329 Benign neoplasm of meninges, unspecified: Secondary | ICD-10-CM | POA: Diagnosis not present

## 2022-11-06 LAB — RAD ONC ARIA SESSION SUMMARY
Course Elapsed Days: 25
Plan Fractions Treated to Date: 19
Plan Prescribed Dose Per Fraction: 1.8 Gy
Plan Total Fractions Prescribed: 30
Plan Total Prescribed Dose: 54 Gy
Reference Point Dosage Given to Date: 34.2 Gy
Reference Point Session Dosage Given: 1.8 Gy
Session Number: 19

## 2022-11-09 ENCOUNTER — Ambulatory Visit
Admission: RE | Admit: 2022-11-09 | Discharge: 2022-11-09 | Disposition: A | Discharge status: Home / Self Care | Payer: Medicare PPO | Source: Ambulatory Visit | Attending: Radiation Oncology | Admitting: Radiation Oncology

## 2022-11-09 ENCOUNTER — Other Ambulatory Visit: Payer: Self-pay

## 2022-11-09 DIAGNOSIS — D329 Benign neoplasm of meninges, unspecified: Secondary | ICD-10-CM | POA: Diagnosis not present

## 2022-11-09 DIAGNOSIS — Z510 Encounter for antineoplastic radiation therapy: Secondary | ICD-10-CM | POA: Diagnosis not present

## 2022-11-09 DIAGNOSIS — D320 Benign neoplasm of cerebral meninges: Secondary | ICD-10-CM | POA: Diagnosis not present

## 2022-11-09 LAB — RAD ONC ARIA SESSION SUMMARY
Course Elapsed Days: 28
Plan Fractions Treated to Date: 20
Plan Prescribed Dose Per Fraction: 1.8 Gy
Plan Total Fractions Prescribed: 30
Plan Total Prescribed Dose: 54 Gy
Reference Point Dosage Given to Date: 36 Gy
Reference Point Session Dosage Given: 1.8 Gy
Session Number: 20

## 2022-11-10 ENCOUNTER — Other Ambulatory Visit: Payer: Self-pay | Admitting: *Deleted

## 2022-11-10 ENCOUNTER — Other Ambulatory Visit: Payer: Self-pay

## 2022-11-10 ENCOUNTER — Ambulatory Visit
Admission: RE | Admit: 2022-11-10 | Discharge: 2022-11-10 | Disposition: A | Discharge status: Home / Self Care | Payer: Medicare PPO | Source: Ambulatory Visit | Attending: Radiation Oncology | Admitting: Radiation Oncology

## 2022-11-10 DIAGNOSIS — D329 Benign neoplasm of meninges, unspecified: Secondary | ICD-10-CM | POA: Diagnosis not present

## 2022-11-10 DIAGNOSIS — Z510 Encounter for antineoplastic radiation therapy: Secondary | ICD-10-CM | POA: Diagnosis not present

## 2022-11-10 DIAGNOSIS — D320 Benign neoplasm of cerebral meninges: Secondary | ICD-10-CM | POA: Diagnosis not present

## 2022-11-10 LAB — RAD ONC ARIA SESSION SUMMARY
Course Elapsed Days: 29
Plan Fractions Treated to Date: 21
Plan Prescribed Dose Per Fraction: 1.8 Gy
Plan Total Fractions Prescribed: 30
Plan Total Prescribed Dose: 54 Gy
Reference Point Dosage Given to Date: 37.8 Gy
Reference Point Session Dosage Given: 1.8 Gy
Session Number: 21

## 2022-11-10 MED ORDER — SUCRALFATE 1 G PO TABS: 1.0000 g | ORAL_TABLET | Freq: Three times a day (TID) | ORAL | 2 refills | Status: DC

## 2022-11-11 ENCOUNTER — Ambulatory Visit
Admission: RE | Admit: 2022-11-11 | Discharge: 2022-11-11 | Disposition: A | Discharge status: Home / Self Care | Payer: Medicare PPO | Source: Ambulatory Visit | Attending: Radiation Oncology | Admitting: Radiation Oncology

## 2022-11-11 ENCOUNTER — Other Ambulatory Visit: Payer: Self-pay

## 2022-11-11 DIAGNOSIS — D329 Benign neoplasm of meninges, unspecified: Secondary | ICD-10-CM | POA: Diagnosis not present

## 2022-11-11 DIAGNOSIS — D320 Benign neoplasm of cerebral meninges: Secondary | ICD-10-CM | POA: Diagnosis not present

## 2022-11-11 DIAGNOSIS — Z510 Encounter for antineoplastic radiation therapy: Secondary | ICD-10-CM | POA: Diagnosis not present

## 2022-11-11 LAB — RAD ONC ARIA SESSION SUMMARY
Course Elapsed Days: 30
Plan Fractions Treated to Date: 22
Plan Prescribed Dose Per Fraction: 1.8 Gy
Plan Total Fractions Prescribed: 30
Plan Total Prescribed Dose: 54 Gy
Reference Point Dosage Given to Date: 39.6 Gy
Reference Point Session Dosage Given: 1.8 Gy
Session Number: 22

## 2022-11-12 ENCOUNTER — Ambulatory Visit
Admission: RE | Admit: 2022-11-12 | Discharge: 2022-11-12 | Disposition: A | Discharge status: Home / Self Care | Payer: Medicare PPO | Source: Ambulatory Visit | Attending: Radiation Oncology | Admitting: Radiation Oncology

## 2022-11-12 ENCOUNTER — Other Ambulatory Visit: Payer: Self-pay

## 2022-11-12 ENCOUNTER — Inpatient Hospital Stay: Payer: Medicare PPO

## 2022-11-12 DIAGNOSIS — D329 Benign neoplasm of meninges, unspecified: Secondary | ICD-10-CM

## 2022-11-12 DIAGNOSIS — D320 Benign neoplasm of cerebral meninges: Secondary | ICD-10-CM | POA: Diagnosis not present

## 2022-11-12 DIAGNOSIS — Z510 Encounter for antineoplastic radiation therapy: Secondary | ICD-10-CM | POA: Diagnosis not present

## 2022-11-12 LAB — CBC (CANCER CENTER ONLY)
HCT: 45.1 % (ref 36.0–46.0)
Hemoglobin: 15.2 g/dL — ABNORMAL HIGH (ref 12.0–15.0)
MCH: 29.4 pg (ref 26.0–34.0)
MCHC: 33.7 g/dL (ref 30.0–36.0)
MCV: 87.2 fL (ref 80.0–100.0)
Platelet Count: 160 10*3/uL (ref 150–400)
RBC: 5.17 MIL/uL — ABNORMAL HIGH (ref 3.87–5.11)
RDW: 13.2 % (ref 11.5–15.5)
WBC Count: 8.4 10*3/uL (ref 4.0–10.5)
nRBC: 0 % (ref 0.0–0.2)

## 2022-11-12 LAB — RAD ONC ARIA SESSION SUMMARY
Course Elapsed Days: 31
Plan Fractions Treated to Date: 23
Plan Prescribed Dose Per Fraction: 1.8 Gy
Plan Total Fractions Prescribed: 30
Plan Total Prescribed Dose: 54 Gy
Reference Point Dosage Given to Date: 41.4 Gy
Reference Point Session Dosage Given: 1.8 Gy
Session Number: 23

## 2022-11-13 ENCOUNTER — Other Ambulatory Visit: Payer: Self-pay

## 2022-11-13 ENCOUNTER — Ambulatory Visit
Admission: RE | Admit: 2022-11-13 | Discharge: 2022-11-13 | Disposition: A | Discharge status: Home / Self Care | Payer: Medicare PPO | Source: Ambulatory Visit | Attending: Radiation Oncology | Admitting: Radiation Oncology

## 2022-11-13 DIAGNOSIS — D320 Benign neoplasm of cerebral meninges: Secondary | ICD-10-CM | POA: Diagnosis not present

## 2022-11-13 DIAGNOSIS — D329 Benign neoplasm of meninges, unspecified: Secondary | ICD-10-CM | POA: Diagnosis not present

## 2022-11-13 DIAGNOSIS — Z510 Encounter for antineoplastic radiation therapy: Secondary | ICD-10-CM | POA: Diagnosis not present

## 2022-11-13 LAB — RAD ONC ARIA SESSION SUMMARY
Course Elapsed Days: 32
Plan Fractions Treated to Date: 24
Plan Prescribed Dose Per Fraction: 1.8 Gy
Plan Total Fractions Prescribed: 30
Plan Total Prescribed Dose: 54 Gy
Reference Point Dosage Given to Date: 43.2 Gy
Reference Point Session Dosage Given: 1.8 Gy
Session Number: 24

## 2022-11-16 ENCOUNTER — Other Ambulatory Visit: Payer: Self-pay

## 2022-11-16 ENCOUNTER — Ambulatory Visit
Admission: RE | Admit: 2022-11-16 | Discharge: 2022-11-16 | Disposition: A | Discharge status: Home / Self Care | Payer: Medicare PPO | Source: Ambulatory Visit | Attending: Radiation Oncology | Admitting: Radiation Oncology

## 2022-11-16 DIAGNOSIS — Z510 Encounter for antineoplastic radiation therapy: Secondary | ICD-10-CM | POA: Diagnosis not present

## 2022-11-16 DIAGNOSIS — D329 Benign neoplasm of meninges, unspecified: Secondary | ICD-10-CM | POA: Diagnosis not present

## 2022-11-16 DIAGNOSIS — D320 Benign neoplasm of cerebral meninges: Secondary | ICD-10-CM | POA: Diagnosis not present

## 2022-11-16 LAB — RAD ONC ARIA SESSION SUMMARY
Course Elapsed Days: 35
Plan Fractions Treated to Date: 25
Plan Prescribed Dose Per Fraction: 1.8 Gy
Plan Total Fractions Prescribed: 30
Plan Total Prescribed Dose: 54 Gy
Reference Point Dosage Given to Date: 45 Gy
Reference Point Session Dosage Given: 1.8 Gy
Session Number: 25

## 2022-11-17 ENCOUNTER — Ambulatory Visit
Admission: RE | Admit: 2022-11-17 | Discharge: 2022-11-17 | Disposition: A | Discharge status: Home / Self Care | Payer: Medicare PPO | Source: Ambulatory Visit | Attending: Radiation Oncology | Admitting: Radiation Oncology

## 2022-11-17 ENCOUNTER — Other Ambulatory Visit: Payer: Self-pay

## 2022-11-17 DIAGNOSIS — D320 Benign neoplasm of cerebral meninges: Secondary | ICD-10-CM | POA: Diagnosis not present

## 2022-11-17 DIAGNOSIS — D329 Benign neoplasm of meninges, unspecified: Secondary | ICD-10-CM | POA: Diagnosis not present

## 2022-11-17 DIAGNOSIS — Z510 Encounter for antineoplastic radiation therapy: Secondary | ICD-10-CM | POA: Diagnosis not present

## 2022-11-17 LAB — RAD ONC ARIA SESSION SUMMARY
Course Elapsed Days: 36
Plan Fractions Treated to Date: 26
Plan Prescribed Dose Per Fraction: 1.8 Gy
Plan Total Fractions Prescribed: 30
Plan Total Prescribed Dose: 54 Gy
Reference Point Dosage Given to Date: 46.8 Gy
Reference Point Session Dosage Given: 1.8 Gy
Session Number: 26

## 2022-11-18 ENCOUNTER — Other Ambulatory Visit: Payer: Self-pay

## 2022-11-18 ENCOUNTER — Ambulatory Visit
Admission: RE | Admit: 2022-11-18 | Discharge: 2022-11-18 | Disposition: A | Discharge status: Home / Self Care | Payer: Medicare PPO | Source: Ambulatory Visit | Attending: Radiation Oncology | Admitting: Radiation Oncology

## 2022-11-18 DIAGNOSIS — Z510 Encounter for antineoplastic radiation therapy: Secondary | ICD-10-CM | POA: Diagnosis not present

## 2022-11-18 DIAGNOSIS — D320 Benign neoplasm of cerebral meninges: Secondary | ICD-10-CM | POA: Diagnosis not present

## 2022-11-18 DIAGNOSIS — D329 Benign neoplasm of meninges, unspecified: Secondary | ICD-10-CM | POA: Diagnosis not present

## 2022-11-18 LAB — RAD ONC ARIA SESSION SUMMARY
Course Elapsed Days: 37
Plan Fractions Treated to Date: 27
Plan Prescribed Dose Per Fraction: 1.8 Gy
Plan Total Fractions Prescribed: 30
Plan Total Prescribed Dose: 54 Gy
Reference Point Dosage Given to Date: 48.6 Gy
Reference Point Session Dosage Given: 1.8 Gy
Session Number: 27

## 2022-11-19 ENCOUNTER — Ambulatory Visit
Admission: RE | Admit: 2022-11-19 | Discharge: 2022-11-19 | Disposition: A | Discharge status: Home / Self Care | Payer: Medicare PPO | Source: Ambulatory Visit | Attending: Radiation Oncology | Admitting: Radiation Oncology

## 2022-11-19 ENCOUNTER — Other Ambulatory Visit: Payer: Self-pay

## 2022-11-19 ENCOUNTER — Inpatient Hospital Stay: Payer: Medicare PPO

## 2022-11-19 DIAGNOSIS — D329 Benign neoplasm of meninges, unspecified: Secondary | ICD-10-CM | POA: Diagnosis not present

## 2022-11-19 DIAGNOSIS — D320 Benign neoplasm of cerebral meninges: Secondary | ICD-10-CM | POA: Diagnosis not present

## 2022-11-19 DIAGNOSIS — Z510 Encounter for antineoplastic radiation therapy: Secondary | ICD-10-CM | POA: Diagnosis not present

## 2022-11-19 LAB — CBC (CANCER CENTER ONLY)
HCT: 42 % (ref 36.0–46.0)
Hemoglobin: 14.5 g/dL (ref 12.0–15.0)
MCH: 30.1 pg (ref 26.0–34.0)
MCHC: 34.5 g/dL (ref 30.0–36.0)
MCV: 87.1 fL (ref 80.0–100.0)
Platelet Count: 171 10*3/uL (ref 150–400)
RBC: 4.82 MIL/uL (ref 3.87–5.11)
RDW: 13.3 % (ref 11.5–15.5)
WBC Count: 7.7 10*3/uL (ref 4.0–10.5)
nRBC: 0 % (ref 0.0–0.2)

## 2022-11-19 LAB — RAD ONC ARIA SESSION SUMMARY
Course Elapsed Days: 38
Plan Fractions Treated to Date: 28
Plan Prescribed Dose Per Fraction: 1.8 Gy
Plan Total Fractions Prescribed: 30
Plan Total Prescribed Dose: 54 Gy
Reference Point Dosage Given to Date: 50.4 Gy
Reference Point Session Dosage Given: 1.8 Gy
Session Number: 28

## 2022-11-20 ENCOUNTER — Ambulatory Visit
Admission: RE | Admit: 2022-11-20 | Discharge: 2022-11-20 | Disposition: A | Discharge status: Home / Self Care | Payer: Medicare PPO | Source: Ambulatory Visit | Attending: Radiation Oncology | Admitting: Radiation Oncology

## 2022-11-20 ENCOUNTER — Other Ambulatory Visit: Payer: Self-pay

## 2022-11-20 DIAGNOSIS — D320 Benign neoplasm of cerebral meninges: Secondary | ICD-10-CM | POA: Diagnosis not present

## 2022-11-20 DIAGNOSIS — D329 Benign neoplasm of meninges, unspecified: Secondary | ICD-10-CM | POA: Diagnosis not present

## 2022-11-20 DIAGNOSIS — Z510 Encounter for antineoplastic radiation therapy: Secondary | ICD-10-CM | POA: Diagnosis not present

## 2022-11-20 LAB — RAD ONC ARIA SESSION SUMMARY
Course Elapsed Days: 39
Plan Fractions Treated to Date: 29
Plan Prescribed Dose Per Fraction: 1.8 Gy
Plan Total Fractions Prescribed: 30
Plan Total Prescribed Dose: 54 Gy
Reference Point Dosage Given to Date: 52.2 Gy
Reference Point Session Dosage Given: 1.8 Gy
Session Number: 29

## 2022-11-23 ENCOUNTER — Ambulatory Visit
Admission: RE | Admit: 2022-11-23 | Discharge: 2022-11-23 | Disposition: A | Discharge status: Home / Self Care | Payer: Medicare PPO | Source: Ambulatory Visit | Attending: Radiation Oncology | Admitting: Radiation Oncology

## 2022-11-23 ENCOUNTER — Other Ambulatory Visit: Payer: Self-pay

## 2022-11-23 DIAGNOSIS — D320 Benign neoplasm of cerebral meninges: Secondary | ICD-10-CM | POA: Diagnosis not present

## 2022-11-23 DIAGNOSIS — D329 Benign neoplasm of meninges, unspecified: Secondary | ICD-10-CM | POA: Diagnosis not present

## 2022-11-23 DIAGNOSIS — Z510 Encounter for antineoplastic radiation therapy: Secondary | ICD-10-CM | POA: Diagnosis not present

## 2022-11-23 LAB — RAD ONC ARIA SESSION SUMMARY
Course Elapsed Days: 42
Plan Fractions Treated to Date: 30
Plan Prescribed Dose Per Fraction: 1.8 Gy
Plan Total Fractions Prescribed: 30
Plan Total Prescribed Dose: 54 Gy
Reference Point Dosage Given to Date: 54 Gy
Reference Point Session Dosage Given: 1.8 Gy
Session Number: 30

## 2022-11-25 ENCOUNTER — Other Ambulatory Visit: Payer: Self-pay | Admitting: *Deleted

## 2022-11-25 MED ORDER — DEXAMETHASONE 2 MG PO TABS: 2.0000 mg | ORAL_TABLET | Freq: Every day | ORAL | 0 refills | Status: DC

## 2022-12-04 ENCOUNTER — Observation Stay
Admission: EM | Admit: 2022-12-04 | Discharge: 2022-12-05 | Disposition: A | Discharge status: Home Health | Payer: Medicare PPO | Attending: Internal Medicine | Admitting: Internal Medicine

## 2022-12-04 ENCOUNTER — Emergency Department: Payer: Medicare PPO

## 2022-12-04 ENCOUNTER — Other Ambulatory Visit: Payer: Self-pay

## 2022-12-04 DIAGNOSIS — D329 Benign neoplasm of meninges, unspecified: Secondary | ICD-10-CM | POA: Diagnosis present

## 2022-12-04 DIAGNOSIS — R7303 Prediabetes: Secondary | ICD-10-CM | POA: Diagnosis not present

## 2022-12-04 DIAGNOSIS — R29898 Other symptoms and signs involving the musculoskeletal system: Secondary | ICD-10-CM

## 2022-12-04 DIAGNOSIS — R8281 Pyuria: Secondary | ICD-10-CM | POA: Diagnosis not present

## 2022-12-04 DIAGNOSIS — R220 Localized swelling, mass and lump, head: Secondary | ICD-10-CM | POA: Diagnosis not present

## 2022-12-04 DIAGNOSIS — R2689 Other abnormalities of gait and mobility: Secondary | ICD-10-CM | POA: Diagnosis not present

## 2022-12-04 DIAGNOSIS — Z7982 Long term (current) use of aspirin: Secondary | ICD-10-CM | POA: Insufficient documentation

## 2022-12-04 DIAGNOSIS — G4733 Obstructive sleep apnea (adult) (pediatric): Secondary | ICD-10-CM | POA: Diagnosis present

## 2022-12-04 DIAGNOSIS — R531 Weakness: Principal | ICD-10-CM | POA: Insufficient documentation

## 2022-12-04 DIAGNOSIS — G936 Cerebral edema: Secondary | ICD-10-CM | POA: Diagnosis not present

## 2022-12-04 DIAGNOSIS — D696 Thrombocytopenia, unspecified: Secondary | ICD-10-CM | POA: Diagnosis not present

## 2022-12-04 DIAGNOSIS — R2681 Unsteadiness on feet: Secondary | ICD-10-CM | POA: Diagnosis not present

## 2022-12-04 DIAGNOSIS — I10 Essential (primary) hypertension: Secondary | ICD-10-CM | POA: Diagnosis not present

## 2022-12-04 LAB — BASIC METABOLIC PANEL
Anion gap: 11 (ref 5–15)
BUN: 17 mg/dL (ref 8–23)
CO2: 20 mmol/L — ABNORMAL LOW (ref 22–32)
Calcium: 8.3 mg/dL — ABNORMAL LOW (ref 8.9–10.3)
Chloride: 104 mmol/L (ref 98–111)
Creatinine, Ser: 0.57 mg/dL (ref 0.44–1.00)
GFR, Estimated: >60 mL/min (ref >60)
Glucose, Bld: 236 mg/dL — ABNORMAL HIGH (ref 70–99)
Potassium: 3.6 mmol/L (ref 3.5–5.1)
Sodium: 135 mmol/L (ref 135–145)

## 2022-12-04 LAB — URINALYSIS, ROUTINE W REFLEX MICROSCOPIC
Bilirubin Urine: NEGATIVE
Glucose, UA: 150 mg/dL — AB
Hgb urine dipstick: NEGATIVE
Ketones, ur: 5 mg/dL — AB
Nitrite: NEGATIVE
Protein, ur: 100 mg/dL — AB
Specific Gravity, Urine: 1.029 (ref 1.005–1.030)
pH: 5 (ref 5.0–8.0)

## 2022-12-04 LAB — CK: Total CK: 129 U/L (ref 38–234)

## 2022-12-04 LAB — VITAMIN B12: Vitamin B-12: 610 pg/mL (ref 180–914)

## 2022-12-04 LAB — TSH: TSH: 2.043 u[IU]/mL (ref 0.350–4.500)

## 2022-12-04 LAB — CBC
HCT: 42.2 % (ref 36.0–46.0)
Hemoglobin: 14.2 g/dL (ref 12.0–15.0)
MCH: 29.4 pg (ref 26.0–34.0)
MCHC: 33.6 g/dL (ref 30.0–36.0)
MCV: 87.4 fL (ref 80.0–100.0)
Platelets: 111 10*3/uL — ABNORMAL LOW (ref 150–400)
RBC: 4.83 MIL/uL (ref 3.87–5.11)
RDW: 13.4 % (ref 11.5–15.5)
WBC: 7.4 10*3/uL (ref 4.0–10.5)
nRBC: 0 % (ref 0.0–0.2)

## 2022-12-04 LAB — C-REACTIVE PROTEIN: CRP: 13 mg/dL — ABNORMAL HIGH (ref <1.0)

## 2022-12-04 LAB — HEMOGLOBIN A1C
Hgb A1c MFr Bld: 9 % — ABNORMAL HIGH (ref 4.8–5.6)
Mean Plasma Glucose: 211.6 mg/dL

## 2022-12-04 LAB — SEDIMENTATION RATE: Sed Rate: 27 mm/hr (ref 0–30)

## 2022-12-04 LAB — GLUCOSE, CAPILLARY
Glucose-Capillary: 166 mg/dL — ABNORMAL HIGH (ref 70–99)
Glucose-Capillary: 178 mg/dL — ABNORMAL HIGH (ref 70–99)

## 2022-12-04 MED ORDER — DIAZEPAM 5 MG PO TABS
5.0000 mg | ORAL_TABLET | Freq: Once | ORAL | Status: AC
  Administered 2022-12-04: 5 mg via ORAL
  Filled 2022-12-04: qty 1

## 2022-12-04 MED ORDER — ACETAMINOPHEN 325 MG PO TABS: 650.0000 mg | ORAL_TABLET | Freq: Four times a day (QID) | ORAL | Status: DC | PRN

## 2022-12-04 MED ORDER — LATANOPROST 0.005 % OP SOLN
1.0000 [drp] | Freq: Every day | OPHTHALMIC | Status: DC
  Administered 2022-12-04: 1 [drp] via OPHTHALMIC
  Filled 2022-12-04: qty 2.5

## 2022-12-04 MED ORDER — ADULT MULTIVITAMIN W/MINERALS CH
1.0000 | ORAL_TABLET | Freq: Every day | ORAL | Status: DC
  Administered 2022-12-04 – 2022-12-05 (×2): 1 via ORAL
  Filled 2022-12-04 (×2): qty 1

## 2022-12-04 MED ORDER — SODIUM CHLORIDE 0.9 % IV BOLUS
1000.0000 mL | Freq: Once | INTRAVENOUS | Status: AC
  Administered 2022-12-04: 1000 mL via INTRAVENOUS

## 2022-12-04 MED ORDER — POLYETHYLENE GLYCOL 3350 17 G PO PACK: 17.0000 g | PACK | Freq: Every day | ORAL | Status: DC | PRN

## 2022-12-04 MED ORDER — SODIUM CHLORIDE 0.9 % IV SOLN
1.0000 g | INTRAVENOUS | Status: AC
  Administered 2022-12-04: 1 g via INTRAVENOUS
  Filled 2022-12-04: qty 10

## 2022-12-04 MED ORDER — IRBESARTAN 150 MG PO TABS
150.0000 mg | ORAL_TABLET | Freq: Every day | ORAL | Status: DC
  Administered 2022-12-04: 150 mg via ORAL
  Filled 2022-12-04 (×2): qty 1

## 2022-12-04 MED ORDER — ONDANSETRON HCL 4 MG/2ML IJ SOLN: 4.0000 mg | Freq: Four times a day (QID) | INTRAMUSCULAR | Status: DC | PRN

## 2022-12-04 MED ORDER — ONDANSETRON HCL 4 MG PO TABS: 4.0000 mg | ORAL_TABLET | Freq: Four times a day (QID) | ORAL | Status: DC | PRN

## 2022-12-04 MED ORDER — ENOXAPARIN SODIUM 40 MG/0.4ML IJ SOSY
40.0000 mg | PREFILLED_SYRINGE | INTRAMUSCULAR | Status: DC
  Administered 2022-12-04: 40 mg via SUBCUTANEOUS
  Filled 2022-12-04: qty 0.4

## 2022-12-04 MED ORDER — GADOBUTROL 1 MMOL/ML IV SOLN
7.5000 mL | Freq: Once | INTRAVENOUS | Status: AC | PRN
  Administered 2022-12-04: 7.5 mL via INTRAVENOUS

## 2022-12-04 MED ORDER — ROSUVASTATIN CALCIUM 10 MG PO TABS
10.0000 mg | ORAL_TABLET | Freq: Every day | ORAL | Status: DC
  Administered 2022-12-05: 10 mg via ORAL
  Filled 2022-12-04: qty 1

## 2022-12-04 MED ORDER — ACETAMINOPHEN 650 MG RE SUPP: 650.0000 mg | Freq: Four times a day (QID) | RECTAL | Status: DC | PRN

## 2022-12-04 MED ORDER — ASPIRIN 81 MG PO TBEC
81.0000 mg | DELAYED_RELEASE_TABLET | Freq: Every day | ORAL | Status: DC
  Administered 2022-12-05: 81 mg via ORAL
  Filled 2022-12-04: qty 1

## 2022-12-04 MED ORDER — INSULIN ASPART 100 UNIT/ML IJ SOLN
0.0000 [IU] | Freq: Three times a day (TID) | INTRAMUSCULAR | Status: DC
  Administered 2022-12-05: 2 [IU] via SUBCUTANEOUS
  Administered 2022-12-05: 5 [IU] via SUBCUTANEOUS
  Filled 2022-12-04: qty 1

## 2022-12-04 MED ORDER — SODIUM CHLORIDE 0.9% FLUSH
3.0000 mL | Freq: Two times a day (BID) | INTRAVENOUS | Status: DC
  Administered 2022-12-04 – 2022-12-05 (×3): 3 mL via INTRAVENOUS

## 2022-12-04 MED ORDER — DILTIAZEM HCL ER COATED BEADS 180 MG PO CP24
360.0000 mg | ORAL_CAPSULE | Freq: Every day | ORAL | Status: DC
  Administered 2022-12-05: 360 mg via ORAL
  Filled 2022-12-04: qty 2

## 2022-12-04 NOTE — Consult Note (Signed)
Neurology Consultation Reason for Consult: Bilateral leg weakness  Requesting Physician: Verdene Lennert  CC: Progressive weakness, c/f steroid side effect   History is obtained from:Patient and chart review   HPI: Vickie Stewart is a 77 y.o. female patient for slowly enlarging right parafalcine meningioma s/p dexamethasone treatment and radiation (6-week course, last dose 11/23/2022), hypertension, hyperlipidemia, BMI 31.64, OSA adherent to CPAP, degenerative disc disease.  She was seen in clinic by Dr. Marcell Barlow in 08/2021 at which time he noted she was having some subjectively reduced sensation on the left side, increased headaches and dizziness for which she was referred to radiation treatment for her slowly enlarging meningioma.  She was treated with dexamethasone 4 mg daily which was reduced to 2 mg daily as she felt that it was causing her to have some weakness.  She stopped taking her dexamethasone with last dose on 7/2 but has persistently felt weak in the lower extremities for which she came to the hospital for evaluation and neurology and neurosurgery were consulted.  Of note on her 4/23 evaluation she had 5/5 strength throughout with normal gait and intact sensation (though perhaps subjectively reduced on the left per notes)  Regarding her weakness she does not notice any associated pain.  She notes she has been taking her statin for a long time without any side effects.  She does not have any associated rashes or skin changes other than some dry skin and mouth ulcers she associates with the radiation treatment only, which have been improving.  She denies any fevers, chills or weight loss.  No new medications.  She notes that when she first gets up during the day she is very stiff and this contributes to her feeling of gait instability.  She has not had any falls.  However she is highly anxious about falls and therefore has been holding onto things to ambulate.  She has not had any  back pain.  She has not had any change in her bowel or bladder habits though she notes some chronic mild fecal incontinence that has been stable for many years.  She has not had any sensory changes.  She has not had any worsening headaches.  She does not feel that her symptoms are worse at any particular time of day.  She does not feel that she gets progressively weaker with progressive ambulation; if anything she feels ambulation gets easier after she gets started.  She has not noted any particular change with incline or declined slope, though she notes she ambulates only on level surfaces. She reports reduced appetite since starting radiation and feels she has lost 8 lbs (78.5 kg today, 81.7 kg on office visit 07/10/2022). However, no sweats, chills or other systemic symptoms and she reports sleeping well   ROS: All other review of systems was negative except as noted in the HPI.   Past Medical History:  Diagnosis Date   Allergic rhinitis, cause unspecified    Anxiety state, unspecified    Contact dermatitis and other eczema, due to unspecified cause    Diffuse cystic mastopathy    Dyspepsia and other specified disorders of function of stomach    Essential hypertension, benign    Glaucoma    Irritable bowel syndrome    Lateral epicondylitis  of elbow    Leukocytopenia, unspecified    Mastodynia    Obesity, unspecified    Pure hypercholesterolemia    Reflux esophagitis    Thoracic or lumbosacral neuritis or radiculitis, unspecified  Unspecified disorder of skin and subcutaneous tissue    Vaginal cancer (HCC)    History   Past Surgical History:  Procedure Laterality Date   BREAST EXCISIONAL BIOPSY Left    neg   BREAST LUMPECTOMY Left    COLONOSCOPY  08/26/2006   COLONOSCOPY WITH PROPOFOL N/A 10/21/2016   Procedure: COLONOSCOPY WITH PROPOFOL;  Surgeon: Kieth Brightly, MD;  Location: [facility] ENDOSCOPY;  Service: Endoscopy;  Laterality: N/A;   ESOPHAGOGASTRODUODENOSCOPY (EGD) WITH  PROPOFOL N/A 02/07/2020   Procedure: ESOPHAGOGASTRODUODENOSCOPY (EGD) WITH PROPOFOL;  Surgeon: Toney Reil, MD;  Location: [facility] ENDOSCOPY;  Service: Gastroenterology;  Laterality: N/A;   Current Outpatient Medications  Medication Instructions   aspirin 81 mg, Oral, Daily, Reported on 10/10/2015   celecoxib (CELEBREX) 100 mg, Oral, Daily   cholecalciferol (VITAMIN D) 1,000 Units, Oral, Daily   dexamethasone (DECADRON) 2 mg, Oral, Daily   diltiazem (CARDIZEM CD) 360 MG 24 hr capsule TAKE 1 CAPSULE BY MOUTH EVERY DAY   latanoprost (XALATAN) 0.005 % ophthalmic solution PLACE 1 DROP IN BOTH EYES AT BEDTIME   magnesium oxide (MAG-OX) 400 mg, Oral, Daily   olmesartan (BENICAR) 20 mg, Oral, Daily   Omega-3 Fatty Acids (FISH OIL CONCENTRATE PO) Oral, Reported on 10/10/2015   omeprazole (PRILOSEC) 20 mg, Oral, 2 times daily   rosuvastatin (CRESTOR) 10 mg, Oral, Daily   sucralfate (CARAFATE) 1 g, Oral, 3 times daily   triamcinolone cream (KENALOG) 0.1 % Topical, 2 times daily   Last dose of dexamethasone 7/2   Family History  Problem Relation Age of Onset   Pulmonary embolism Mother    Hypertension Father    Aortic stenosis Father    Breast cancer Neg Hx     Social History:  reports that she has never smoked. She has never used smokeless tobacco. She reports that she does not drink alcohol and does not use drugs.   Exam: Current vital signs: BP 130/74 (BP Location: Right Arm)   Pulse 75   Temp 98.4 F (36.9 C)   Resp 17   Ht 5\' 2"  (1.575 m)   Wt 78.5 kg   SpO2 97%   BMI 31.64 kg/m  Vital signs in last 24 hours: Temp:  [98.1 F (36.7 C)-98.4 F (36.9 C)] 98.4 F (36.9 C) (07/05 1517) Pulse Rate:  [60-97] 75 (07/05 1517) Resp:  [16-17] 17 (07/05 1517) BP: (106-134)/(59-74) 130/74 (07/05 1517) SpO2:  [94 %-98 %] 97 % (07/05 1517) Weight:  [78.5 kg] 78.5 kg (07/05 0932)   Physical Exam  Constitutional: Appears well-developed and well-nourished.  Psych: Affect mildly  anxious but calm and cooperative Eyes: No scleral injection HENT: No oropharyngeal obstruction.  MSK: no major joint deformities.  Cardiovascular: Perfusing extremities well Respiratory: Effort normal, non-labored breathing GI: Soft.  No distension. There is no tenderness.  Skin: Warm dry and intact visible skin  Neuro: Mental Status: Patient is awake, alert, oriented to person, place, month, year, and situation. Patient is able to give a clear and coherent history. No signs of aphasia or neglect Cranial Nerves: II: Visual Fields are full. Pupils are equal, round, and reactive to light.   III,IV, VI: EOMI without ptosis or diploplia.  V: Facial sensation is symmetric to temperature VII: Facial movement is symmetric.  VIII: hearing is intact to voice X: Uvula elevates symmetrically XI: Shoulder shrug is symmetric. XII: tongue is midline without atrophy or fasciculations.  Motor:  5/5 strength was present in all four extremities, except for mild bilateral hip  flexion weakness 4/5 Sensory: Sensation is symmetric to light touch and temperature in the arms and legs.  There is no length dependent loss of temperature sensation Deep Tendon Reflexes: 2+ and symmetric in the brachioradialis and patellae.  Cerebellar: FNF is intact bilaterally HKS are intact though slightly limited by effort.  No truncal ataxia.  Able to maintain tandem stance Gait: Does take several attempts to rise from side of the bed and requires arm support to rise She has a very tentative shuffling wide-based gait.  She will not ambulate without support.  However she is able to rise on heels and toes.  She is able to maintain a tandem stance without any unsteadiness, and able to walk a few steps of tandem gait although when walking with tandem gait just as when walking casually she requests a significant amount of support   I have reviewed labs in epic and the results pertinent to this consultation are:  Basic  Metabolic Panel: Recent Labs  Lab 12/04/22 0934  NA 135  K 3.6  CL 104  CO2 20*  GLUCOSE 236*  BUN 17  CREATININE 0.57  CALCIUM 8.3*    CBC: Recent Labs  Lab 12/04/22 0934  WBC 7.4  HGB 14.2  HCT 42.2  MCV 87.4  PLT 111*    Coagulation Studies: No results for input(s): "LABPROT", "INR" in the last 72 hours.   CK 129  ESR 27  TSH 2.043  I have reviewed the images obtained:  MRI brain personally reviewed, agree with radiology:   Similar Putative 2.1 cm meningioma along the high right falx. Mild adjacent brain edema and mass effect is similar.  Impression: Multifactorial gait disorder with elements of arthritic pain (worst on first starting to move), normal aging related hip flexion weakness, possible mild effects of steroid treatment, and fear of falling curtailing her gait. Mild hip flexor weakness only on my eval; distally she is even able to rise on heels and toes. Given poor appetite and weight loss, checking for nutritional causes is reasonable. Mainstay of treatment will be PT/OT, exam not concerning for spinal cord localization, brain imaging reassuring as well; no evidence of pathology such as NPH at this time.   Recommendations: - Copper, Zinc, Vit E, B12, folate, thiamine for nutritional causes of weakness - Agree with screening for inflammatory process with ESR/CRP (ESR reassuring) - CK reassuring against statin induced process, continue statin - PT/OT eval - Inpatient neurology will sign off but please reach out if questions/concerns arise; patient can be referred to outpatient neurology for long term follow-up, may benefit from movement disorders evaluation if symptoms persist  Brooke Dare MD-PhD [facility] [phone] Available 7 AM to 7 PM, outside these hours please contact Neurologist on call listed on AMION

## 2022-12-04 NOTE — Assessment & Plan Note (Signed)
History of prediabetes with last A1c of 5.9%.  Significantly hyperglycemic at this point likely due to recent steroid use.  - A1c pending - SSI, sensitive

## 2022-12-04 NOTE — H&P (Addendum)
History and Physical    Patient: Vickie Stewart WJX:[id number] DOB: 1945/10/11 DOA: 12/04/2022 DOS: the patient was seen and examined on 12/04/2022 PCP: Alba Cory, MD  Patient coming from: Home  Chief Complaint:  Chief Complaint  Patient presents with   Weakness   HPI: Vickie Stewart is a 77 y.o. female with medical history significant of meningioma of the right parafalcine region s/p IMRT, hypertension, hyperlipidemia, reflux esophagitis, who presents to the ED due to bilateral lower extremity weakness.  Vickie Stewart states that she has been experiencing gradually progressing generalized with that she mostly notices in her legs ever since starting steroids in April.  She notes that she was initially on 4 mg of Decadron but the dose was decreased to 2 mg due to this weakness.  She has been taking it daily but stopped 2 days ago.  She denies any numbness or tingling.  She notes that over the last 1 week, weakness has led to the point that she cannot walk around her home independently and has to hold onto the wall to make it short distances.  Due to this, she came to the ED today.  She denies any dizziness, headache, neck pain, back pain, chest pain, shortness of breath, nausea, vomiting, diarrhea.  She feels that her left leg may be a bit more weak than her right but is uncertain when she first noticed this.  She feels that her upper extremities feel subjectively weaker but she notes that she is able to use them without difficulty.  Patient denies any dysuria, urinary frequency or urgency.  She denies any abdominal pain.  Last IMRT on November 23, 2022.  ED course: On arrival to the ED, patient was normotensive at 106/64 with heart rate of 97.  She was saturating 94% on room air.  She was afebrile at 98.1.  Initial workup demonstrated normal CBC with exception of platelets of 111, and BMP with bicarb of 20, glucose of 236 but normal renal function with creatinine of 0.57.  Urinalysis  demonstrated glucosuria, ketonuria, small leukocytes, many bacteria and 6-10 WBC/hpf. MRI of the brain with and without contrast was ordered.  Neurosurgery and neurology were consulted.  [facility] contacted for admission.  Review of Systems: As mentioned in the history of present illness. All other systems reviewed and are negative.  Past Medical History:  Diagnosis Date   Allergic rhinitis, cause unspecified    Anxiety state, unspecified    Contact dermatitis and other eczema, due to unspecified cause    Diffuse cystic mastopathy    Dyspepsia and other specified disorders of function of stomach    Essential hypertension, benign    Glaucoma    Irritable bowel syndrome    Lateral epicondylitis  of elbow    Leukocytopenia, unspecified    Mastodynia    Obesity, unspecified    Pure hypercholesterolemia    Reflux esophagitis    Thoracic or lumbosacral neuritis or radiculitis, unspecified    Unspecified disorder of skin and subcutaneous tissue    Vaginal cancer (HCC)    History   Past Surgical History:  Procedure Laterality Date   BREAST EXCISIONAL BIOPSY Left    neg   BREAST LUMPECTOMY Left    COLONOSCOPY  08/26/2006   COLONOSCOPY WITH PROPOFOL N/A 10/21/2016   Procedure: COLONOSCOPY WITH PROPOFOL;  Surgeon: Kieth Brightly, MD;  Location: [facility] ENDOSCOPY;  Service: Endoscopy;  Laterality: N/A;   ESOPHAGOGASTRODUODENOSCOPY (EGD) WITH PROPOFOL N/A 02/07/2020   Procedure: ESOPHAGOGASTRODUODENOSCOPY (EGD) WITH PROPOFOL;  Surgeon: Toney Reil, MD;  Location: [facility] ENDOSCOPY;  Service: Gastroenterology;  Laterality: N/A;   Social History:  reports that she has never smoked. She has never used smokeless tobacco. She reports that she does not drink alcohol and does not use drugs.  Allergies  Allergen Reactions   Atorvastatin     difficulty concentrating and focusing   Terbinafine And Related Dermatitis    Rash, itch, skin discoloration   Zetia [Ezetimibe] Other (See Comments)     Muscle/joint pain    Family History  Problem Relation Age of Onset   Pulmonary embolism Mother    Hypertension Father    Aortic stenosis Father    Breast cancer Neg Hx     Prior to Admission medications   Medication Sig Start Date End Date Taking? Authorizing Provider  dexamethasone (DECADRON) 2 MG tablet Take 1 tablet (2 mg total) by mouth daily. 11/25/22   Carmina Miller, MD  sucralfate (CARAFATE) 1 g tablet Take 1 tablet (1 g total) by mouth in the morning, at noon, and at bedtime. 11/10/22   Carmina Miller, MD  aspirin 81 MG tablet Take 81 mg by mouth daily. Reported on 10/10/2015    [provider]  celecoxib (CELEBREX) 100 MG capsule Take 1 capsule (100 mg total) by mouth daily. 07/10/22   Alba Cory, MD  cholecalciferol (VITAMIN D) 1000 units tablet Take 1,000 Units by mouth daily.    [provider]  diltiazem (CARDIZEM CD) 360 MG 24 hr capsule TAKE 1 CAPSULE BY MOUTH EVERY DAY 07/10/22   Carlynn Purl, Danna Hefty, MD  latanoprost (XALATAN) 0.005 % ophthalmic solution PLACE 1 DROP IN BOTH EYES AT BEDTIME 06/20/17   [provider]  magnesium oxide (MAG-OX) 400 (240 Mg) MG tablet Take 400 mg by mouth daily.    [provider]  olmesartan (BENICAR) 20 MG tablet Take 1 tablet (20 mg total) by mouth daily. 07/10/22   Alba Cory, MD  Omega-3 Fatty Acids (FISH OIL CONCENTRATE PO) Take by mouth. Reported on 10/10/2015    [provider]  omeprazole (PRILOSEC) 20 MG capsule Take 1 capsule (20 mg total) by mouth in the morning and at bedtime. 07/10/22   Alba Cory, MD  rosuvastatin (CRESTOR) 10 MG tablet Take 1 tablet (10 mg total) by mouth daily. 01/07/22   Alba Cory, MD  triamcinolone cream (KENALOG) 0.1 % Apply topically 2 (two) times daily. 09/03/21   Alba Cory, MD    Physical Exam: Vitals:   12/04/22 1200 12/04/22 1230 12/04/22 1300 12/04/22 1442  BP: 128/64 108/64 (!) 116/59   Pulse: 66 68 60   Resp:      Temp:      TempSrc:     Oral  SpO2: 96% 94% 95%   Weight:      Height:       Physical Exam Vitals and nursing note reviewed.  Constitutional:      Appearance: She is normal weight.  HENT:     Head: Normocephalic and atraumatic.     Mouth/Throat:     Mouth: Mucous membranes are moist.     Pharynx: Oropharynx is clear.  Eyes:     Extraocular Movements: Extraocular movements intact.     Conjunctiva/sclera: Conjunctivae normal.     Pupils: Pupils are equal, round, and reactive to light.  Cardiovascular:     Rate and Rhythm: Normal rate and regular rhythm.     Heart sounds: No murmur heard. Pulmonary:     Effort: Pulmonary effort  is normal. No respiratory distress.     Breath sounds: Normal breath sounds. No wheezing, rhonchi or rales.  Musculoskeletal:     Right lower leg: No edema.     Left lower leg: No edema.  Skin:    General: Skin is warm and dry.     Findings: No rash.  Neurological:     Mental Status: She is alert and oriented to person, place, and time.     Comments:  Patient is alert and oriented x 3 Sensation intact throughout No facial asymmetry or dysarthria 5 out of 5 strength of bilateral upper extremities, both distal and peripheral 3-4 out of 5 strength of right lower extremity 2-3 out of 5 strength of left lower extremity Finger-to-nose normal bilaterally   Psychiatric:        Mood and Affect: Mood normal.        Behavior: Behavior normal.        Thought Content: Thought content normal.        Judgment: Judgment normal.    Data Reviewed: CBC with WBC 7.4, hemoglobin 14.2, platelets 111 BMP with sodium of 135, potassium 3.6, bicarb 20, glucose 236, BUN 17, creatinine 0.57 GFR above 60. CK normal at 129 Urinalysis with glucosuria, ketonuria, leukocytes, no nitrites, proteinuria, and many bacteria with 6-10 WBC/hpf  EKG personally reviewed.  Sinus rhythm with rate of 96.  Nonspecific T wave changes.  No ST or T wave changes consistent with acute ischemia.  Results are  pending, will review when available.  Assessment and Plan:  * Lower extremity weakness Patient is presenting with bilateral lower extremity weakness that seems to predominantly affect the lower extremities below the knees, although the hips are affected as well; left is weaker than the right.  She has been on Decadron daily starting in April, raising the concern for steroid-induced myopathy, however the Decadron dose is rather low.  Given current treatment for meningioma with IMRT, MRI has been ordered to rule out central etiology, however less likely given bilateral nature of symptoms.  Will also evaluate for thyroid involvement or inflammatory.  - Neurosurgery and neurology consulted; appreciate their recommendations - MRI with and without contrast pending - Telemetry monitoring until central process is ruled out - TSH, vitamin B12, ESR, CRP pending - PT/OT  Meningioma [facility] Patient has a history of meningioma, and has completed IMRT on 6/24.  - Neurosurgery consulted; appreciate their recommendations - MRI with and without contrast pending  Thrombocytopenia (HCC) Mild thrombocytopenia noted on labs today.  Will continue to monitor and if persistent, pursue further workup.  - Repeat CBC in the a.m.  Pyuria Urinalysis with evidence of pyuria given leukocytes, WBCs and bacteria, however patient is declining any urinary or abdominal symptoms.  Would be unusual for UTI to present as bilateral leg weakness.  She has already received one-time dose of ceftriaxone.  - Hold off on further antibiotics  Essential hypertension, benign - Continue home antihypertensives  OSA (obstructive sleep apnea) - CPAP at bedtime  Pre-diabetes History of prediabetes with last A1c of 5.9%.  Significantly hyperglycemic at this point likely due to recent steroid use.  - A1c pending - SSI, sensitive  Advance Care Planning:   Code Status: Full Code verified by patient  Consults: Neurosurgery,  neurology  Family Communication: No family at bedside  Severity of Illness: The appropriate patient status for this patient is OBSERVATION. Observation status is judged to be reasonable and necessary in order to provide the required intensity of  service to ensure the patient's safety. The patient's presenting symptoms, physical exam findings, and initial radiographic and laboratory data in the context of their medical condition is felt to place them at decreased risk for further clinical deterioration. Furthermore, it is anticipated that the patient will be medically stable for discharge from the hospital within 2 midnights of admission.   Author: Verdene Lennert, MD 12/04/2022 2:53 PM  For on call review [email or web address].

## 2022-12-04 NOTE — ED Triage Notes (Signed)
Pt to ED for generalized weakness for a couple weeks . Just finished 6 weeks of radiation. States steroids were making her feel weak as well, just stopped taking them. States has tumor in head. Denies n/v/d

## 2022-12-04 NOTE — Assessment & Plan Note (Signed)
Patient has a history of meningioma, and has completed IMRT on 6/24.  - Neurosurgery consulted; appreciate their recommendations - MRI with and without contrast pending

## 2022-12-04 NOTE — Assessment & Plan Note (Signed)
Mild thrombocytopenia noted on labs today.  Will continue to monitor and if persistent, pursue further workup.  - Repeat CBC in the a.m.

## 2022-12-04 NOTE — Assessment & Plan Note (Addendum)
Patient is presenting with bilateral lower extremity weakness that seems to predominantly affect the lower extremities below the knees, although the hips are affected as well; left is weaker than the right.  She has been on Decadron daily starting in April, raising the concern for steroid-induced myopathy, however the Decadron dose is rather low.  Given current treatment for meningioma with IMRT, MRI has been ordered to rule out central etiology, however less likely given bilateral nature of symptoms.  Will also evaluate for thyroid involvement or inflammatory.  - Neurosurgery and neurology consulted; appreciate their recommendations - MRI with and without contrast pending - Telemetry monitoring until central process is ruled out - TSH, vitamin B12, ESR, CRP pending - PT/OT

## 2022-12-04 NOTE — Assessment & Plan Note (Signed)
-   CPAP at bedtime 

## 2022-12-04 NOTE — ED Provider Notes (Signed)
[facility] Provider Note    Event Date/Time   First MD Initiated Contact with Patient 12/04/22 1109     (approximate)   History   Weakness   HPI  Vickie Stewart is a 77 y.o. female with a history of meningioma as well as history of esophagitis, hypercholesterolemia irritable bowel.  Currently on radiation therapy for probable meningioma  Has been experiencing increasing feeling of weakness in her lower legs especially with her hips.  No numbness.  No tingling.  No abdominal pain no fevers no chills no chest pain or other symptoms  Is to the point now over the last 2 weeks she feels like she cannot really hold herself up having to hold onto the wall because of how weak she feels in her hips     Physical Exam   Triage Vital Signs: ED Triage Vitals  Enc Vitals Group     BP 12/04/22 0931 106/64     Pulse Rate 12/04/22 0931 97     Resp 12/04/22 0931 16     Temp 12/04/22 0931 98.1 F (36.7 C)     Temp src --      SpO2 12/04/22 0931 94 %     Weight 12/04/22 0932 173 lb (78.5 kg)     Height 12/04/22 0932 5\' 2"  (1.575 m)     Head Circumference --      Peak Flow --      Pain Score 12/04/22 0932 0     Pain Loc --      Pain Edu? --      Excl. in GC? --     Most recent vital signs: Vitals:   12/04/22 0931 12/04/22 1107  BP: 106/64 116/72  Pulse: 97 88  Resp: 16 16  Temp: 98.1 F (36.7 C)   SpO2: 94% 95%     General: Awake, no distress.  Denies headache.  Normal cranial nerve exam no deficits.  Upper extremity strong 5-5 lower extremities notable for about 2 out of 5 strength in the hips with especially flexion extension but demonstrates good use of the feet ankles, and 4-5 strength at the knees flexion extension.  No neurologic deficits as far as sensory deficit in the lower extremities or upper extremities bilaterally.  Strong palpable pulses in lower extremities bilaterally warm well-perfused CV:  Good peripheral perfusion.   Resp:  Normal effort.  Clear bilateral Abd:  No distention.  Soft nontender nondistended Other:     ED Results / Procedures / Treatments   Labs (all labs ordered are listed, but only abnormal results are displayed) Labs Reviewed  BASIC METABOLIC PANEL - Abnormal; Notable for the following components:      Result Value   CO2 20 (*)    Glucose, Bld 236 (*)    Calcium 8.3 (*)    All other components within normal limits  CBC - Abnormal; Notable for the following components:   Platelets 111 (*)    All other components within normal limits  URINALYSIS, ROUTINE W REFLEX MICROSCOPIC - Abnormal; Notable for the following components:   Color, Urine AMBER (*)    APPearance CLOUDY (*)    Glucose, UA 150 (*)    Ketones, ur 5 (*)    Protein, ur 100 (*)    Leukocytes,Ua SMALL (*)    Bacteria, UA MANY (*)    All other components within normal limits  CK     EKG  Interpreted at 10 AM heart rate  95 QRS 60 QTc 400 Normal sinus rhythm slight artifact.  No obvious ischemia   RADIOLOGY  No results found.  MRI of the brain with and without contrast pending at time of admission decision.  Will be followed up by Dr. Katrinka Blazing who requested the study   PROCEDURES:  Critical Care performed: No  Procedures   MEDICATIONS ORDERED IN ED: Medications  diazepam (VALIUM) tablet 5 mg (has no administration in time range)  sodium chloride 0.9 % bolus 1,000 mL (1,000 mLs Intravenous New Bag/Given 12/04/22 1154)  cefTRIAXone (ROCEPHIN) 1 g in sodium chloride 0.9 % 100 mL IVPB (1 g Intravenous New Bag/Given 12/04/22 1211)     IMPRESSION / MDM / ASSESSMENT AND PLAN / ED COURSE  I reviewed the triage vital signs and the nursing notes.                              Differential diagnosis includes, but is not limited to, possible steroid myopathy, dehydration, urinary tract infection, other cause such as central neurologic abnormality, etc.  Given the patient's insidious onset over the last 2 to 3  weeks as well as high steroid use I am concerned myopathy is high the differential.  Nonetheless, I have consulted and will patient will be seen by her neurosurgeon, neurology, and admitted to hospitalist service.  Urinalysis suggestive of possible UTI.  Will start with Rocephin labs interpreted as normal CBC metabolic panel with hyperglycemia but no history of diabetes suspect likely secondary to steroid use  Patient's presentation is most consistent with acute complicated illness / injury requiring diagnostic workup.  No associated back pain.  At this point I do not have any specific reason to suspect this is a central spinal cord process, but of course pending consults by neurology and neurosurgery are also anticipated.  Based on the clinical history I am most concerned this is steroid related and I am confident that we have appropriate consultations pending and the patient can be admitted to the hospital in stable condition at this time  The patient is on the cardiac monitor to evaluate for evidence of arrhythmia and/or significant heart rate changes.  Patient agreeable understand plan for admission    Consulted with and patient accepte dto hsopitalist by DR. Huel Cote, aware MRI brain pending as well as full neurology (Dr. Iver Nestle) and N Surg consutls Katrinka Blazing) to follow  FINAL CLINICAL IMPRESSION(S) / ED DIAGNOSES   Final diagnoses:  Bilateral leg weakness     Rx / DC Orders   ED Discharge Orders     None        Note:  This document was prepared using Dragon voice recognition software and may include unintentional dictation errors.   Sharyn Creamer, MD 12/04/22 1253

## 2022-12-04 NOTE — Assessment & Plan Note (Signed)
Continue home antihypertensives 

## 2022-12-04 NOTE — Consult Note (Signed)
Consulting Department:  Emergency department  Primary Physician:  Alba Cory, MD  Chief Complaint: History of a brain tumor, progressive weakness  History of Present Illness: 12/04/2022 Vickie Stewart is a 77 y.o. female who presents with the chief complaint of progressive bilateral lower extremity weakness.  She has a known right-sided parafalcine meningioma status post radiation therapy.  Also has a history of hypertension, hyperlipidemia, esophagitis who presented for bilateral lower extremity weakness.  She feels like she has had generalized weakness without sensation loss in her bilateral lower extremities since approximately April.  She has been tapering off of her Decadron but has been on 4 mg for quite some time.  She feels like she has difficulty ambulating.  She denies any other neurologic symptoms.  No seizures.  No dizziness, neck pain, back pain, radiating pain in her upper or lower extremities.  Last dose of radiation therapy was June 24/2024.  A 10 point review of systems is negative, except for the pertinent positives and negatives detailed in the HPI.  Past Medical History: Past Medical History:  Diagnosis Date   Allergic rhinitis, cause unspecified    Anxiety state, unspecified    Contact dermatitis and other eczema, due to unspecified cause    Diffuse cystic mastopathy    Dyspepsia and other specified disorders of function of stomach    Essential hypertension, benign    Glaucoma    Irritable bowel syndrome    Lateral epicondylitis  of elbow    Leukocytopenia, unspecified    Mastodynia    Obesity, unspecified    Pure hypercholesterolemia    Reflux esophagitis    Thoracic or lumbosacral neuritis or radiculitis, unspecified    Unspecified disorder of skin and subcutaneous tissue    Vaginal cancer (HCC)    History    Past Surgical History: Past Surgical History:  Procedure Laterality Date   BREAST EXCISIONAL BIOPSY Left    neg   BREAST LUMPECTOMY  Left    COLONOSCOPY  08/26/2006   COLONOSCOPY WITH PROPOFOL N/A 10/21/2016   Procedure: COLONOSCOPY WITH PROPOFOL;  Surgeon: Kieth Brightly, MD;  Location: [facility] ENDOSCOPY;  Service: Endoscopy;  Laterality: N/A;   ESOPHAGOGASTRODUODENOSCOPY (EGD) WITH PROPOFOL N/A 02/07/2020   Procedure: ESOPHAGOGASTRODUODENOSCOPY (EGD) WITH PROPOFOL;  Surgeon: Toney Reil, MD;  Location: [facility] ENDOSCOPY;  Service: Gastroenterology;  Laterality: N/A;    Allergies: Allergies as of 12/04/2022 - Review Complete 12/04/2022  Allergen Reaction Noted   Atorvastatin  07/26/2015   Terbinafine and related Dermatitis 01/23/2016   Zetia [ezetimibe] Other (See Comments) 07/10/2022    Medications:  Current Facility-Administered Medications:    acetaminophen (TYLENOL) tablet 650 mg, 650 mg, Oral, Q6H PRN **OR** acetaminophen (TYLENOL) suppository 650 mg, 650 mg, Rectal, Q6H PRN, Verdene Lennert, MD   enoxaparin (LOVENOX) injection 40 mg, 40 mg, Subcutaneous, Q24H, Huel Cote, Iulia, MD   insulin aspart (novoLOG) injection 0-9 Units, 0-9 Units, Subcutaneous, TID WC, Verdene Lennert, MD   multivitamin with minerals tablet 1 tablet, 1 tablet, Oral, Daily, Verdene Lennert, MD   ondansetron (ZOFRAN) tablet 4 mg, 4 mg, Oral, Q6H PRN **OR** ondansetron (ZOFRAN) injection 4 mg, 4 mg, Intravenous, Q6H PRN, Verdene Lennert, MD   polyethylene glycol (MIRALAX / GLYCOLAX) packet 17 g, 17 g, Oral, Daily PRN, Verdene Lennert, MD   sodium chloride flush (NS) 0.9 % injection 3 mL, 3 mL, Intravenous, Q12H, Verdene Lennert, MD  Current Outpatient Medications:    aspirin 81 MG tablet, Take 81 mg by mouth daily. Reported on 10/10/2015,  Disp: , Rfl:    cholecalciferol (VITAMIN D) 1000 units tablet, Take 1,000 Units by mouth daily., Disp: , Rfl:    dexamethasone (DECADRON) 2 MG tablet, Take 1 tablet (2 mg total) by mouth daily., Disp: 12 tablet, Rfl: 0   diltiazem (CARDIZEM CD) 360 MG 24 hr capsule, TAKE 1 CAPSULE BY MOUTH EVERY  DAY, Disp: 90 capsule, Rfl: 1   latanoprost (XALATAN) 0.005 % ophthalmic solution, PLACE 1 DROP IN BOTH EYES AT BEDTIME, Disp: , Rfl: 4   magnesium oxide (MAG-OX) 400 (240 Mg) MG tablet, Take 400 mg by mouth daily., Disp: , Rfl:    olmesartan (BENICAR) 20 MG tablet, Take 1 tablet (20 mg total) by mouth daily., Disp: 90 tablet, Rfl: 1   Omega-3 Fatty Acids (FISH OIL CONCENTRATE PO), Take by mouth. Reported on 10/10/2015, Disp: , Rfl:    omeprazole (PRILOSEC) 20 MG capsule, Take 1 capsule (20 mg total) by mouth in the morning and at bedtime., Disp: 180 capsule, Rfl: 1   rosuvastatin (CRESTOR) 10 MG tablet, Take 1 tablet (10 mg total) by mouth daily., Disp: 90 tablet, Rfl: 3   sucralfate (CARAFATE) 1 g tablet, Take 1 tablet (1 g total) by mouth in the morning, at noon, and at bedtime., Disp: 90 tablet, Rfl: 2   celecoxib (CELEBREX) 100 MG capsule, Take 1 capsule (100 mg total) by mouth daily. (Patient not taking: Reported on 12/04/2022), Disp: 90 capsule, Rfl: 0   triamcinolone cream (KENALOG) 0.1 %, Apply topically 2 (two) times daily. (Patient not taking: Reported on 12/04/2022), Disp: 453.6 g, Rfl: 0   Social History: Social History   Tobacco Use   Smoking status: Never   Smokeless tobacco: Never   Tobacco comments:    experimented with dip snuff as a child  Vaping Use   Vaping Use: Never used  Substance Use Topics   Alcohol use: No   Drug use: No    Family Medical History: Family History  Problem Relation Age of Onset   Pulmonary embolism Mother    Hypertension Father    Aortic stenosis Father    Breast cancer Neg Hx     Physical Examination: Vitals:   12/04/22 1230 12/04/22 1300  BP: 108/64 (!) 116/59  Pulse: 68 60  Resp:    Temp:    SpO2: 94% 95%     General: Patient is well developed, well nourished, calm, collected, and in no apparent distress.  NEUROLOGICAL:  General: In no acute distress.   Awake, alert, oriented to person, place, and time.  Pupils equal round and  reactive to light.  Full Facial tone is symmetric.  Tongue protrusion is midline.  Bilateral upper extremities are full strength proximally and distally.  There is no pronator drift.  Language is conversant.  Her lower extremities are where you see the neurologic deficit.  Distally she is strong in plantarflexion dorsiflexion EHL, and knee flexion.  Her knee extension is approximately 4+ out of 5 on the left and 5 out of 5 on the right, her hip flexion is approximately 4+ on the right and 4 on the left.  GCS:15   Bilateral upper and lower extremity sensation is intact to light touch.  Imaging: Narrative & Impression  CLINICAL DATA:  Motor neuron disease suspected   EXAM: MRI HEAD WITHOUT AND WITH CONTRAST   TECHNIQUE: Multiplanar, multiecho pulse sequences of the brain and surrounding structures were obtained without and with intravenous contrast.   CONTRAST:  7.92mL GADAVIST GADOBUTROL 1 MMOL/ML  IV SOLN   COMPARISON:  MRI head April 22, 2021.   FINDINGS: Brain: Homogeneously enhancing 2.1 x 1.5 cm dural based enhancing mass along the high right falx (series 16, image 126), probably a meningioma. There is mild adjacent brain edema and mass effect. The mass is inferior to the superior sagittal sinus. No other pathologic enhancement. No evidence of acute infarct, acute hemorrhage, midline shift or hydrocephalus.   Vascular: Major arterial flow voids are maintained at the skull base.   Skull and upper cervical spine: Normal marrow signal.   Sinuses/Orbits: Clear sinuses.  No acute orbital findings.   Other: No mastoid effusions.   IMPRESSION: Similar Putative 2.1 cm meningioma along the high right falx. Mild adjacent brain edema and mass effect is similar.     Electronically Signed   By: Feliberto Harts M.D.   On: 12/04/2022 14:33      I have personally reviewed the images and agree with the above interpretation.  Labs:    Latest Ref Rng & Units 12/04/2022     9:34 AM 11/19/2022   10:26 AM 11/12/2022   10:07 AM  CBC  WBC 4.0 - 10.5 K/uL 7.4  7.7  8.4   Hemoglobin 12.0 - 15.0 g/dL 40.9  81.1  91.4   Hematocrit 36.0 - 46.0 % 42.2  42.0  45.1   Platelets 150 - 400 K/uL 111  171  160        Assessment and Plan: Ms. Hains is a pleasant 77 y.o. female with history of a right-sided parafalcine meningioma.  Has had previous radiation therapy for it.  Has been having steroids which has been slowly tapering off.  Her last radiation treatment was in January.  She has had progressive history of bilateral lower extremity weakness that seems to be more proximal than distal.  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  The imaging shows no new changes to the tumor, stable FLAIR signal change.  This is in the region of the lower extremity motor strip, however is mostly right-sided which would likely lead to a left-sided deficit, she is left side predominant, but also has significant right-sided symptoms.  We feel like this is more likely to be a diffuse process rather than directly related to mass effect from the tumor.  Consider myopathy secondary to steroid initiation which times well with this presentation, or radiation effects.  Lovenia Kim, MD/MSCR Dept. of Neurosurgery

## 2022-12-04 NOTE — Assessment & Plan Note (Addendum)
Urinalysis with evidence of pyuria given leukocytes, WBCs and bacteria, however patient is declining any urinary or abdominal symptoms.  Would be unusual for UTI to present as bilateral leg weakness.  She has already received one-time dose of ceftriaxone.  - Hold off on further antibiotics

## 2022-12-04 NOTE — Progress Notes (Signed)
Pt stated she will OK without CPAP tonight but stated she will call if she thinks she needs it.

## 2022-12-05 DIAGNOSIS — D329 Benign neoplasm of meninges, unspecified: Secondary | ICD-10-CM | POA: Diagnosis not present

## 2022-12-05 DIAGNOSIS — I10 Essential (primary) hypertension: Secondary | ICD-10-CM | POA: Diagnosis not present

## 2022-12-05 DIAGNOSIS — G4733 Obstructive sleep apnea (adult) (pediatric): Secondary | ICD-10-CM | POA: Diagnosis not present

## 2022-12-05 DIAGNOSIS — R29898 Other symptoms and signs involving the musculoskeletal system: Secondary | ICD-10-CM | POA: Diagnosis not present

## 2022-12-05 LAB — CBC
HCT: 37.2 % (ref 36.0–46.0)
Hemoglobin: 12.7 g/dL (ref 12.0–15.0)
MCH: 29.7 pg (ref 26.0–34.0)
MCHC: 34.1 g/dL (ref 30.0–36.0)
MCV: 86.9 fL (ref 80.0–100.0)
Platelets: 98 10*3/uL — ABNORMAL LOW (ref 150–400)
RBC: 4.28 MIL/uL (ref 3.87–5.11)
RDW: 13.6 % (ref 11.5–15.5)
WBC: 5 10*3/uL (ref 4.0–10.5)
nRBC: 0 % (ref 0.0–0.2)

## 2022-12-05 LAB — BASIC METABOLIC PANEL
Anion gap: 9 (ref 5–15)
BUN: 10 mg/dL (ref 8–23)
CO2: 23 mmol/L (ref 22–32)
Calcium: 8 mg/dL — ABNORMAL LOW (ref 8.9–10.3)
Chloride: 103 mmol/L (ref 98–111)
Creatinine, Ser: 0.51 mg/dL (ref 0.44–1.00)
GFR, Estimated: >60 mL/min (ref >60)
Glucose, Bld: 189 mg/dL — ABNORMAL HIGH (ref 70–99)
Potassium: 3.2 mmol/L — ABNORMAL LOW (ref 3.5–5.1)
Sodium: 135 mmol/L (ref 135–145)

## 2022-12-05 LAB — GLUCOSE, CAPILLARY
Glucose-Capillary: 173 mg/dL — ABNORMAL HIGH (ref 70–99)
Glucose-Capillary: 226 mg/dL — ABNORMAL HIGH (ref 70–99)
Glucose-Capillary: 257 mg/dL — ABNORMAL HIGH (ref 70–99)

## 2022-12-05 LAB — FOLATE: Folate: 16.8 ng/mL (ref >5.9)

## 2022-12-05 MED ORDER — ADULT MULTIVITAMIN W/MINERALS CH: 1.0000 | ORAL_TABLET | Freq: Every day | ORAL | 0 refills | Status: DC

## 2022-12-05 NOTE — Discharge Instructions (Signed)
Use walker as recommended by PT

## 2022-12-05 NOTE — TOC Transition Note (Signed)
Transition of Care [facility]) - CM/SW Discharge Note   Patient Details  Name: Vickie Stewart MRN: [id number] Date of Birth: 11-Aug-1945  Transition of Care [facility]) CM/SW Contact:  Kemper Durie, RN Phone Number: 12/05/2022, 12:28 PM   Clinical Narrative:     Patient admitted from home, lives alone but has family support.  Brother will provide transportation home, she will look into insurance for transportation to follow up appointments.  If unable to secure transportation, advised to contact PCP office (Dr. Carlynn Purl) to request assistance from care management team.  Obtains medications from [facility] [address].    Aware of recommendations for PT and OT as well as for rolling walker. She agrees to all.  [facility] notified of pending discharge home today, spoke with [facility], will have RW delivered to bedside prior to discharge.  Spoke with Kandee Keen at [facility], they will provide PT/OT.    Final next level of care: Home w Home Health Services Barriers to Discharge: Barriers Resolved   Patient Goals and CMS Choice CMS Medicare.gov Compare Post Acute Care list provided to:: Patient    Discharge Placement                         Discharge Plan and Services Additional resources added to the After Visit Summary for                  DME Arranged: Walker rolling DME Agency: [facility] Date DME Agency Contacted: 12/05/22 Time DME Agency Contacted: [phone] Representative spoke with at DME Agency: Leavy Cella HH Arranged: PT, OT HH Agency: [facility] Date [facility] Agency Contacted: 12/05/22 Time HH Agency Contacted: [phone] Representative spoke with at [facility] Agency: Frances Furbish  Social Determinants of Health (SDOH) Interventions SDOH Screenings   Food Insecurity: No Food Insecurity (12/04/2022)  Housing: Low Risk  (12/04/2022)  Transportation Needs: No Transportation Needs (12/04/2022)  Utilities: Not At Risk (12/04/2022)  Alcohol Screen: Low Risk  (09/03/2021)  Depression (PHQ2-9): Low Risk  (09/28/2022)   Financial Resource Strain: Low Risk  (09/03/2021)  Physical Activity: Sufficiently Active (09/03/2021)  Social Connections: Moderately Integrated (09/03/2021)  Stress: No Stress Concern Present (09/03/2021)  Tobacco Use: Low Risk  (12/04/2022)     Readmission Risk Interventions     No data to display

## 2022-12-05 NOTE — Evaluation (Signed)
Physical Therapy Evaluation Patient Details Name: Vickie Stewart MRN: [id number] DOB: 12-19-1945 Today's Date: 12/05/2022  History of Present Illness  Vickie Stewart is a 77 y.o. female who presents with the chief complaint of progressive bilateral lower extremity weakness.  She has a known right-sided parafalcine meningioma status post radiation therapy.  Also has a history of hypertension, hyperlipidemia, esophagitis who presented for bilateral lower extremity weakness.  Clinical Impression  Pt is a pleasant 77 year old female who was admitted for generalized leg weakness. Pt performs bed mobility supervision with HOB elevated, transfers min guard, and ambulation with min guard and RW. Ambulated to bathroom w/ RW min guard, sit<>stand min guard with grab bar and performed hygiene independently. Ambulated 100 ft in hallway with RW and min guard, demonstrated step through pattern but complained of her legs feeling weak. Returned patient to room and demonstrated bed exercises. SLR x 5 b/l, heel slides x 5 b/l and ankle pumps x 10 b/l.  Pt demonstrates deficits with strength, ROM, balance, safe DME use and mobility. Pt left in bed with call bell and alarm set. Pt will continue to receive skilled PT services while admitted and will defer to TOC/care team for updates regarding disposition planning.         Assistance Recommended at Discharge Intermittent Supervision/Assistance  If plan is discharge home, recommend the following:  Can travel by private vehicle  A little help with walking and/or transfers;Help with stairs or ramp for entrance;Assist for transportation        Equipment Recommendations Rolling walker (2 wheels)  Recommendations for Other Services       Functional Status Assessment Patient has had a recent decline in their functional status and demonstrates the ability to make significant improvements in function in a reasonable and predictable amount of time.      Precautions / Restrictions Precautions Precautions: Fall Restrictions Weight Bearing Restrictions: No      Mobility  Bed Mobility Overal bed mobility: Needs Assistance Bed Mobility: Sit to Supine, Supine to Sit     Supine to sit: Supervision, HOB elevated Sit to supine: Supervision, HOB elevated   General bed mobility comments: Pt. lifting LLE insto bed with her hands.    Transfers Overall transfer level: Needs assistance Equipment used: Rolling walker (2 wheels) Transfers: Sit to/from Stand Sit to Stand: Min guard           General transfer comment: sit<>stand from bed and to/from toilet.    Ambulation/Gait Ambulation/Gait assistance: Min guard Gait Distance (Feet): 100 Feet Assistive device: Rolling walker (2 wheels) Gait Pattern/deviations: Step-through pattern, Wide base of support, Decreased stride length          Stairs            Wheelchair Mobility     Tilt Bed    Modified Rankin (Stroke Patients Only)       Balance Overall balance assessment: Needs assistance Sitting-balance support: No upper extremity supported, Feet supported Sitting balance-Leahy Scale: Normal     Standing balance support: Bilateral upper extremity supported, During functional activity, Reliant on assistive device for balance Standing balance-Leahy Scale: Good                               Pertinent Vitals/Pain Pain Assessment Pain Assessment: No/denies pain    Home Living Family/patient expects to be discharged to:: Private residence Living Arrangements: Alone Available Help at Discharge: Family (brother) Type of  Home: House Home Access: Stairs to enter Entrance Stairs-Rails: Doctor, general practice of Steps: 4 Alternate Level Stairs-Number of Steps: 2 Home Layout: Multi-level (Split level home) Home Equipment: Shower seat;Grab bars - toilet;Grab bars - tub/shower      Prior Function Prior Level of Function :  Independent/Modified Independent;Driving             Mobility Comments: Was not using AD prior to admission. Reports LE weakness x approx 2 weeks prior to admission. Was using furniture to hold onto. ADLs Comments: (I) BADL/IADL. Has not driven for about 2 weeks due to fear from LE weakness. Brother assists with IADLs as needed.     Hand Dominance   Dominant Hand: Right    Extremity/Trunk Assessment   Upper Extremity Assessment Upper Extremity Assessment: Overall WFL for tasks assessed (Pt reports feeling weak in LUE; MMT is nearly symmetrical BIL, perhaps slightly weaker in LUE. Both functional 4+/5.)    Lower Extremity Assessment Lower Extremity Assessment: Defer to PT evaluation (Pt appears to have LLE weakness during dressing task.) LLE Deficits / Details: 4/5 Hip flexion strength.       Communication   Communication: No difficulties  Cognition Arousal/Alertness: Awake/alert Behavior During Therapy: WFL for tasks assessed/performed Overall Cognitive Status: Within Functional Limits for tasks assessed                                          General Comments      Exercises General Exercises - Lower Extremity Ankle Circles/Pumps: AROM, Both, 10 reps Heel Slides: AROM, Both, 5 reps Straight Leg Raises: AROM, Both, 5 reps   Assessment/Plan    PT Assessment Patient needs continued PT services  PT Problem List Decreased strength;Decreased range of motion;Decreased balance;Decreased mobility;Decreased knowledge of use of DME       PT Treatment Interventions DME instruction;Gait training;Stair training;Functional mobility training;Therapeutic activities;Therapeutic exercise    PT Goals (Current goals can be found in the Care Plan section)  Acute Rehab PT Goals Patient Stated Goal: to get stronger PT Goal Formulation: With patient Time For Goal Achievement: 12/19/22 Potential to Achieve Goals: Good    Frequency Min 2X/week     Co-evaluation                AM-PAC PT "6 Clicks" Mobility  Outcome Measure Help needed turning from your back to your side while in a flat bed without using bedrails?: None Help needed moving from lying on your back to sitting on the side of a flat bed without using bedrails?: None Help needed moving to and from a bed to a chair (including a wheelchair)?: A Little Help needed standing up from a chair using your arms (e.g., wheelchair or bedside chair)?: A Little Help needed to walk in hospital room?: A Little Help needed climbing 3-5 steps with a railing? : A Little 6 Click Score: 20    End of Session Equipment Utilized During Treatment: Gait belt Activity Tolerance: Other (comment) (limited by patient fear of falling) Patient left: in bed;with call bell/phone within reach;with bed alarm set Nurse Communication: Mobility status PT Visit Diagnosis: Unsteadiness on feet (R26.81);Other abnormalities of gait and mobility (R26.89)    Time: 1610-9604 PT Time Calculation (min) (ACUTE ONLY): 28 min   Charges:   PT Evaluation $PT Eval Low Complexity: 1 Low PT Treatments $Gait Training: 8-22 mins PT General Charges $$ ACUTE PT  VISIT: 1 Visit         Malachi Carl, SPT   Malachi Carl 12/05/2022, 12:37 PM

## 2022-12-05 NOTE — Discharge Summary (Signed)
Physician Discharge Summary   Patient: Vickie Stewart MRN: [id number] DOB: 1946/01/14  Admit date:     12/04/2022  Discharge date: 12/05/22  Discharge Physician: Enedina Finner   PCP: Alba Cory, MD   Recommendations at discharge:    F/u PCP in 1-2 weeks F/u Dr Myer Haff on your next appt Use your walker as instructed and cont your leg exercises as per PT recs  Discharge Diagnoses: Principal Problem:   Lower extremity weakness Active Problems:   Meningioma (HCC)   Thrombocytopenia (HCC)   Pyuria   Essential hypertension, benign   OSA (obstructive sleep apnea)   Pre-diabetes  Vickie Stewart is a 77 y.o. female with medical history significant of meningioma of the right parafalcine region s/p IMRT, hypertension, hyperlipidemia, reflux esophagitis, who presents to the ED due to bilateral lower extremity weakness.   Lower extremity weakness --Patient is presenting with bilateral lower extremity weakness that seems to predominantly affect the lower extremities below the knees, although the hips are affected as well; left is weaker than the right.  She has been on Decadron daily starting in April, raising the concern for steroid-induced myopathy, however the Decadron dose is rather low.  Pt has stopped her steroids - Neurosurgery  input noted--likley steroid induced Myopathy. MRI brain stable. Pt completed RT --neurology consulted; appreciate their recommendations - PT/OT recommends RW and HHPT/OT   Meningioma [facility]) Patient has a history of meningioma, and has completed IMRT on 6/24.   - Neurosurgery consulted; appreciate their recommendations - MRI with and without contrast no acute changes   Thrombocytopenia (HCC) Mild thrombocytopenia noted on labs today.   Pyuria Urinalysis with evidence of pyuria given leukocytes, WBCs and bacteria, however patient is declining any urinary or abdominal symptoms.  Would be unusual for UTI to present as bilateral leg weakness.   She has already received one-time dose of ceftriaxone.  - Hold off on further antibiotics   Essential hypertension, benign - Continue home antihypertensives   OSA (obstructive sleep apnea) - CPAP at bedtime   Pre-diabetes History of prediabetes with last A1c of 5.9%.  Significantly hyperglycemic at this point likely due to recent steroid use.   Advance Care Planning:   Code Status: Full Code verified by patient   Consults: Neurosurgery, neurology   overall patient feels stable. She worked with physical therapy. She is recommended to continue PT OT at home and continue leg exercises along with using walker at home. Discharge plan discussed patient is agreeable      Disposition: Home health Diet recommendation:  Discharge Diet Orders (From admission, onward)     Start     Ordered   12/05/22 0000  Diet - low sodium heart healthy        12/05/22 1156           Cardiac and Carb modified diet DISCHARGE MEDICATION: Allergies as of 12/05/2022       Reactions   Atorvastatin    difficulty concentrating and focusing   Terbinafine And Related Dermatitis   Rash, itch, skin discoloration   Zetia [ezetimibe] Other (See Comments)   Muscle/joint pain        Medication List     STOP taking these medications    celecoxib 100 MG capsule Commonly known as: CELEBREX   dexamethasone 2 MG tablet Commonly known as: DECADRON   triamcinolone cream 0.1 % Commonly known as: KENALOG       TAKE these medications    aspirin 81 MG tablet Take 81  mg by mouth daily. Reported on 10/10/2015   cholecalciferol 1000 units tablet Commonly known as: VITAMIN D Take 1,000 Units by mouth daily.   diltiazem 360 MG 24 hr capsule Commonly known as: CARDIZEM CD TAKE 1 CAPSULE BY MOUTH EVERY DAY   FISH OIL CONCENTRATE PO Take by mouth. Reported on 10/10/2015   latanoprost 0.005 % ophthalmic solution Commonly known as: XALATAN PLACE 1 DROP IN BOTH EYES AT BEDTIME   magnesium oxide 400  (240 Mg) MG tablet Commonly known as: MAG-OX Take 400 mg by mouth daily.   multivitamin with minerals Tabs tablet Take 1 tablet by mouth daily. Start taking on: December 06, 2022   olmesartan 20 MG tablet Commonly known as: BENICAR Take 1 tablet (20 mg total) by mouth daily.   omeprazole 20 MG capsule Commonly known as: PRILOSEC Take 1 capsule (20 mg total) by mouth in the morning and at bedtime.   rosuvastatin 10 MG tablet Commonly known as: Crestor Take 1 tablet (10 mg total) by mouth daily.   sucralfate 1 g tablet Commonly known as: Carafate Take 1 tablet (1 g total) by mouth in the morning, at noon, and at bedtime.               Durable Medical Equipment  (From admission, onward)           Start     Ordered   12/05/22 1157  DME Walker  Once       Question Answer Comment  Walker: With 5 Inch Wheels   Patient needs a walker to treat with the following condition Weakness      12/05/22 1156             Filed Weights   12/04/22 0932  Weight: 78.5 kg     Condition at discharge: fair  The results of significant diagnostics from this hospitalization (including imaging, microbiology, ancillary and laboratory) are listed below for reference.   Imaging Studies: MR Brain W and Wo Contrast  Result Date: 12/04/2022 CLINICAL DATA:  Motor neuron disease suspected EXAM: MRI HEAD WITHOUT AND WITH CONTRAST TECHNIQUE: Multiplanar, multiecho pulse sequences of the brain and surrounding structures were obtained without and with intravenous contrast. CONTRAST:  7.22mL GADAVIST GADOBUTROL 1 MMOL/ML IV SOLN COMPARISON:  MRI head April 22, 2021. FINDINGS: Brain: Homogeneously enhancing 2.1 x 1.5 cm dural based enhancing mass along the high right falx (series 16, image 126), probably a meningioma. There is mild adjacent brain edema and mass effect. The mass is inferior to the superior sagittal sinus. No other pathologic enhancement. No evidence of acute infarct, acute  hemorrhage, midline shift or hydrocephalus. Vascular: Major arterial flow voids are maintained at the skull base. Skull and upper cervical spine: Normal marrow signal. Sinuses/Orbits: Clear sinuses.  No acute orbital findings. Other: No mastoid effusions. IMPRESSION: Similar Putative 2.1 cm meningioma along the high right falx. Mild adjacent brain edema and mass effect is similar. Electronically Signed   By: Feliberto Harts M.D.   On: 12/04/2022 14:33    Microbiology: Results for orders placed or performed in visit on 08/13/20  CULTURE, URINE COMPREHENSIVE     Status: None   Collection Time: 08/13/20 11:53 AM  Result Value Ref Range Status   MICRO NUMBER: [id number]  Final   SPECIMEN QUALITY: Adequate  Final   Source OTHER (SPECIFY)  Final   STATUS: FINAL  Final   RESULT: No Growth  Final    Labs: CBC: Recent Labs  Lab 12/04/22 0934  12/05/22 0615  WBC 7.4 5.0  HGB 14.2 12.7  HCT 42.2 37.2  MCV 87.4 86.9  PLT 111* 98*   Basic Metabolic Panel: Recent Labs  Lab 12/04/22 0934 12/05/22 0615  NA 135 135  K 3.6 3.2*  CL 104 103  CO2 20* 23  GLUCOSE 236* 189*  BUN 17 10  CREATININE 0.57 0.51  CALCIUM 8.3* 8.0*   Liver Function Tests: No results for input(s): "AST", "ALT", "ALKPHOS", "BILITOT", "PROT", "ALBUMIN" in the last 168 hours. CBG: Recent Labs  Lab 12/04/22 1615 12/04/22 1946 12/05/22 0808  GLUCAP 166* 178* 173*    Discharge time spent: greater than 30 minutes.  Signed: Enedina Finner, MD [facility] 12/05/2022

## 2022-12-05 NOTE — Plan of Care (Addendum)
Patient left with out DME. Pt said she is tired of waiting and wants to leave but won't mind coming back to pick up the walker. We will call her when we received the walker.  Problem: Education: Goal: Knowledge of General Education information will improve Description: Including pain rating scale, medication(s)/side effects and non-pharmacologic comfort measures Outcome: Completed/Met   Problem: Health Behavior/Discharge Planning: Goal: Ability to manage health-related needs will improve Outcome: Completed/Met   Problem: Clinical Measurements: Goal: Ability to maintain clinical measurements within normal limits will improve Outcome: Completed/Met Goal: Will remain free from infection Outcome: Completed/Met Goal: Diagnostic test results will improve Outcome: Completed/Met Goal: Respiratory complications will improve Outcome: Completed/Met Goal: Cardiovascular complication will be avoided Outcome: Completed/Met   Problem: Activity: Goal: Risk for activity intolerance will decrease Outcome: Completed/Met   Problem: Nutrition: Goal: Adequate nutrition will be maintained Outcome: Completed/Met   Problem: Coping: Goal: Level of anxiety will decrease Outcome: Completed/Met   Problem: Elimination: Goal: Will not experience complications related to bowel motility Outcome: Completed/Met Goal: Will not experience complications related to urinary retention Outcome: Completed/Met   Problem: Pain Managment: Goal: General experience of comfort will improve Outcome: Completed/Met   Problem: Safety: Goal: Ability to remain free from injury will improve Outcome: Completed/Met   Problem: Skin Integrity: Goal: Risk for impaired skin integrity will decrease Outcome: Completed/Met   Problem: Education: Goal: Ability to describe self-care measures that may prevent or decrease complications (Diabetes Survival Skills Education) will improve Outcome: Completed/Met Goal: Individualized  Educational Video(s) Outcome: Completed/Met   Problem: Coping: Goal: Ability to adjust to condition or change in health will improve Outcome: Completed/Met   Problem: Fluid Volume: Goal: Ability to maintain a balanced intake and output will improve Outcome: Completed/Met   Problem: Health Behavior/Discharge Planning: Goal: Ability to identify and utilize available resources and services will improve Outcome: Completed/Met Goal: Ability to manage health-related needs will improve Outcome: Completed/Met   Problem: Metabolic: Goal: Ability to maintain appropriate glucose levels will improve Outcome: Completed/Met   Problem: Nutritional: Goal: Maintenance of adequate nutrition will improve Outcome: Completed/Met Goal: Progress toward achieving an optimal weight will improve Outcome: Completed/Met   Problem: Skin Integrity: Goal: Risk for impaired skin integrity will decrease Outcome: Completed/Met   Problem: Tissue Perfusion: Goal: Adequacy of tissue perfusion will improve Outcome: Completed/Met   Problem: Acute Rehab PT Goals(only PT should resolve) Goal: Pt Will Ambulate Outcome: Completed/Met Goal: Pt Will Go Up/Down Stairs Outcome: Completed/Met   Problem: Acute Rehab OT Goals (only OT should resolve) Goal: Pt. Will Perform Lower Body Dressing Outcome: Completed/Met Goal: Pt. Will Perform Tub/Shower Transfer Outcome: Completed/Met

## 2022-12-05 NOTE — Evaluation (Signed)
Occupational Therapy Evaluation Patient Details Name: Vickie Stewart MRN: [id number] DOB: May 18, 1946 Today's Date: 12/05/2022   History of Present Illness Vickie Stewart is a 77 y.o. female who presents with the chief complaint of progressive bilateral lower extremity weakness.  She has a known right-sided parafalcine meningioma status post radiation therapy.  Also has a history of hypertension, hyperlipidemia, esophagitis who presented for bilateral lower extremity weakness.   Clinical Impression   Patient received for OT evaluation. See flowsheet below for details of function. Generally, patient requiring supervision and use of bed rails for bed mobility, MOD (I) with RW for functional mobility, and Mod (I)-MIN A for ADLs. Patient will benefit from continued OT while in acute care.      Recommendations for follow up therapy are one component of a multi-disciplinary discharge planning process, led by the attending physician.  Recommendations may be updated based on patient status, additional functional criteria and insurance authorization.   Assistance Recommended at Discharge PRN  Patient can return home with the following Assistance with cooking/housework;Help with stairs or ramp for entrance;Assist for transportation    Functional Status Assessment  Patient has had a recent decline in their functional status and demonstrates the ability to make significant improvements in function in a reasonable and predictable amount of time.  Equipment Recommendations  None recommended by OT    Recommendations for Other Services       Precautions / Restrictions Precautions Precautions: Fall Restrictions Weight Bearing Restrictions: No      Mobility Bed Mobility Overal bed mobility: Modified Independent Bed Mobility: Supine to Sit     Supine to sit: HOB elevated, Modified independent (Device/Increase time) (pt used bed rails to t/f to seated EOB)           Transfers Overall transfer level: Needs assistance Equipment used: Rolling walker (2 wheels) Transfers: Sit to/from Stand Sit to Stand: Supervision (OT cues for hand placement (pt trying to put BIL hands on RW; requiring cue for at least one hand on bed))           General transfer comment: needing grab bar and RW for sit to stand from toilet today; RW and bed for sit to stand from bed.      Balance Overall balance assessment: Needs assistance Sitting-balance support: No upper extremity supported, Feet supported Sitting balance-Leahy Scale: Normal     Standing balance support: Bilateral upper extremity supported, During functional activity Standing balance-Leahy Scale: Good Standing balance comment: use RW for mobility; able to stand at sink and wash hands without loss of balance.                           ADL either performed or assessed with clinical judgement   ADL Overall ADL's : Needs assistance/impaired Eating/Feeding: Independent   Grooming: Wash/dry hands;Standing     Upper Body Bathing Details (indicate cue type and reason): anticipate (I)   Lower Body Bathing Details (indicate cue type and reason): anticipate (I) from seated Upper Body Dressing : Modified independent;Sitting   Lower Body Dressing: Cueing for compensatory techniques;Sit to/from stand Lower Body Dressing Details (indicate cue type and reason): OT provided education on donning LLE first since that side is weaker and more difficult to dress; pt tried this strategy and had increased success. no physical assistance required. Pt needed increased time. Was able to make figure four by picking up her LLE with arms and moving it to opposite knee in order  to tie shoe. Was able to lean forward on R to tie shoe. Toilet Transfer: Cueing for safety;Regular Toilet;Grab bars;Rolling walker (2 wheels) Toilet Transfer Details (indicate cue type and reason): Pt able to t/f to toilet with grab bar. Required  cues on positioning of RW and use of grab bar on L side for pt to pull up (as she tried several times while seated sideways on toilet without grab bar and was unsuccessful in sit to stand t/f); pt stated she does have a grab bar next to toilet at home. Toileting- Clothing Manipulation and Hygiene: Modified independent;Sit to/from stand Toileting - Clothing Manipulation Details (indicate cue type and reason): seated hygiene for urination today   Tub/Shower Transfer Details (indicate cue type and reason): not tested; anticipate MIN A for shower transfer given LLE weakness. Functional mobility during ADLs: Rolling walker (2 wheels);Modified independent General ADL Comments: Pt requiring extra time and cues for technique during dressing today; otherwise MOD (I).     Vision Baseline Vision/History: 1 Wears glasses Patient Visual Report: No change from baseline       Perception     Praxis      Pertinent Vitals/Pain Pain Assessment Pain Assessment: No/denies pain     Hand Dominance Right   Extremity/Trunk Assessment Upper Extremity Assessment Upper Extremity Assessment: Overall WFL for tasks assessed (Pt reports feeling weak in LUE; MMT is nearly symmetrical BIL, perhaps slightly weaker in LUE. Both functional 4+/5.)   Lower Extremity Assessment Lower Extremity Assessment: Defer to PT evaluation (Pt appears to have LLE weakness during dressing task.) LLE Deficits / Details: 4/5 Hip flexion strength.   Cervical / Trunk Assessment Cervical / Trunk Assessment: Normal   Communication Communication Communication: No difficulties   Cognition Arousal/Alertness: Awake/alert Behavior During Therapy: WFL for tasks assessed/performed Overall Cognitive Status: Within Functional Limits for tasks assessed                                 General Comments: Pleasant; appears slightly anxious     General Comments  Pt appearing to be fatigued after ADL routine and while leaning  forward to dress; encouraged her to take a few breaths and rest break.    Exercises     Shoulder Instructions      Home Living Family/patient expects to be discharged to:: Private residence Living Arrangements: Alone Available Help at Discharge: Family (brother) Type of Home: House Home Access: Stairs to enter Secretary/administrator of Steps: 4 Entrance Stairs-Rails: Right;Left Home Layout: Multi-level (Split level home) Alternate Level Stairs-Number of Steps: 2   Bathroom Shower/Tub: Producer, television/film/video: Standard Bathroom Accessibility: Yes How Accessible: Accessible via walker Home Equipment: Shower seat;Grab bars - toilet;Grab bars - tub/shower          Prior Functioning/Environment Prior Level of Function : Independent/Modified Independent;Driving             Mobility Comments: Was not using AD prior to admission. Reports LE weakness x approx 2 weeks prior to admission. Was using furniture to hold onto. ADLs Comments: (I) BADL/IADL. Has not driven for about 2 weeks due to fear from LE weakness. Brother assists with IADLs as needed.        OT Problem List: Decreased strength;Decreased activity tolerance;Impaired balance (sitting and/or standing);Decreased knowledge of use of DME or AE      OT Treatment/Interventions: Self-care/ADL training;Therapeutic exercise;Therapeutic activities;DME and/or AE instruction;Patient/family education    OT  Goals(Current goals can be found in the care plan section) Acute Rehab OT Goals Patient Stated Goal: Get better OT Goal Formulation: With patient Time For Goal Achievement: 12/19/22 Potential to Achieve Goals: Good ADL Goals Pt Will Perform Lower Body Dressing: with modified independence;sit to/from stand Pt Will Perform Tub/Shower Transfer: Shower transfer;grab bars;rolling walker;shower seat  OT Frequency: Min 1X/week    Co-evaluation              AM-PAC OT "6 Clicks" Daily Activity     Outcome  Measure Help from another person eating meals?: None Help from another person taking care of personal grooming?: None Help from another person toileting, which includes using toliet, bedpan, or urinal?: None Help from another person bathing (including washing, rinsing, drying)?: A Little Help from another person to put on and taking off regular upper body clothing?: None Help from another person to put on and taking off regular lower body clothing?: None 6 Click Score: 23   End of Session Equipment Utilized During Treatment: Rolling walker (2 wheels) Nurse Communication: Mobility status  Activity Tolerance: Patient tolerated treatment well Patient left: in bed;with call bell/phone within reach (pt seated EOB, set up with tray table in front)  OT Visit Diagnosis: Other abnormalities of gait and mobility (R26.89)                Time: 1610-9604 OT Time Calculation (min): 19 min Charges:  OT General Charges $OT Visit: 1 Visit OT Evaluation $OT Eval Moderate Complexity: 1 Mod OT Treatments $Self Care/Home Management : 8-22 mins  Linward Foster, MS, OTR/L  Alvester Morin 12/05/2022, 1:30 PM

## 2022-12-08 ENCOUNTER — Inpatient Hospital Stay
Admission: EM | Admit: 2022-12-08 | Discharge: 2022-12-15 | DRG: 556 | Disposition: A | Discharge status: Skilled Nursing Facility | Payer: Medicare PPO | Attending: Internal Medicine | Admitting: Internal Medicine

## 2022-12-08 ENCOUNTER — Telehealth: Payer: Self-pay | Admitting: Family Medicine

## 2022-12-08 ENCOUNTER — Emergency Department: Payer: Medicare PPO

## 2022-12-08 ENCOUNTER — Encounter: Payer: Self-pay | Admitting: Emergency Medicine

## 2022-12-08 ENCOUNTER — Other Ambulatory Visit: Payer: Self-pay

## 2022-12-08 ENCOUNTER — Telehealth: Payer: Self-pay | Admitting: Neurosurgery

## 2022-12-08 ENCOUNTER — Encounter: Payer: Self-pay | Admitting: Family Medicine

## 2022-12-08 DIAGNOSIS — Z79899 Other long term (current) drug therapy: Secondary | ICD-10-CM

## 2022-12-08 DIAGNOSIS — Z6833 Body mass index (BMI) 33.0-33.9, adult: Secondary | ICD-10-CM | POA: Diagnosis not present

## 2022-12-08 DIAGNOSIS — Z1152 Encounter for screening for COVID-19: Secondary | ICD-10-CM

## 2022-12-08 DIAGNOSIS — Z87891 Personal history of nicotine dependence: Secondary | ICD-10-CM | POA: Diagnosis not present

## 2022-12-08 DIAGNOSIS — Z923 Personal history of irradiation: Secondary | ICD-10-CM | POA: Diagnosis not present

## 2022-12-08 DIAGNOSIS — E669 Obesity, unspecified: Secondary | ICD-10-CM | POA: Diagnosis present

## 2022-12-08 DIAGNOSIS — K429 Umbilical hernia without obstruction or gangrene: Secondary | ICD-10-CM | POA: Diagnosis not present

## 2022-12-08 DIAGNOSIS — I10 Essential (primary) hypertension: Secondary | ICD-10-CM | POA: Diagnosis present

## 2022-12-08 DIAGNOSIS — Z8544 Personal history of malignant neoplasm of other female genital organs: Secondary | ICD-10-CM | POA: Diagnosis not present

## 2022-12-08 DIAGNOSIS — M5124 Other intervertebral disc displacement, thoracic region: Secondary | ICD-10-CM | POA: Diagnosis not present

## 2022-12-08 DIAGNOSIS — R262 Difficulty in walking, not elsewhere classified: Secondary | ICD-10-CM | POA: Diagnosis present

## 2022-12-08 DIAGNOSIS — Z7982 Long term (current) use of aspirin: Secondary | ICD-10-CM | POA: Diagnosis not present

## 2022-12-08 DIAGNOSIS — G4733 Obstructive sleep apnea (adult) (pediatric): Secondary | ICD-10-CM | POA: Diagnosis present

## 2022-12-08 DIAGNOSIS — K219 Gastro-esophageal reflux disease without esophagitis: Secondary | ICD-10-CM | POA: Diagnosis present

## 2022-12-08 DIAGNOSIS — K2100 Gastro-esophageal reflux disease with esophagitis, without bleeding: Secondary | ICD-10-CM | POA: Diagnosis present

## 2022-12-08 DIAGNOSIS — J189 Pneumonia, unspecified organism: Secondary | ICD-10-CM | POA: Diagnosis present

## 2022-12-08 DIAGNOSIS — M5126 Other intervertebral disc displacement, lumbar region: Secondary | ICD-10-CM | POA: Diagnosis not present

## 2022-12-08 DIAGNOSIS — D509 Iron deficiency anemia, unspecified: Secondary | ICD-10-CM | POA: Diagnosis present

## 2022-12-08 DIAGNOSIS — Z888 Allergy status to other drugs, medicaments and biological substances status: Secondary | ICD-10-CM | POA: Diagnosis not present

## 2022-12-08 DIAGNOSIS — D696 Thrombocytopenia, unspecified: Secondary | ICD-10-CM | POA: Diagnosis present

## 2022-12-08 DIAGNOSIS — J9601 Acute respiratory failure with hypoxia: Secondary | ICD-10-CM | POA: Diagnosis present

## 2022-12-08 DIAGNOSIS — R4182 Altered mental status, unspecified: Secondary | ICD-10-CM | POA: Diagnosis not present

## 2022-12-08 DIAGNOSIS — R7303 Prediabetes: Secondary | ICD-10-CM | POA: Diagnosis present

## 2022-12-08 DIAGNOSIS — D320 Benign neoplasm of cerebral meninges: Secondary | ICD-10-CM | POA: Diagnosis not present

## 2022-12-08 DIAGNOSIS — D513 Other dietary vitamin B12 deficiency anemia: Secondary | ICD-10-CM | POA: Diagnosis present

## 2022-12-08 DIAGNOSIS — D480 Neoplasm of uncertain behavior of bone and articular cartilage: Secondary | ICD-10-CM | POA: Diagnosis present

## 2022-12-08 DIAGNOSIS — E7800 Pure hypercholesterolemia, unspecified: Secondary | ICD-10-CM | POA: Diagnosis present

## 2022-12-08 DIAGNOSIS — G936 Cerebral edema: Secondary | ICD-10-CM | POA: Diagnosis not present

## 2022-12-08 DIAGNOSIS — Z743 Need for continuous supervision: Secondary | ICD-10-CM | POA: Diagnosis not present

## 2022-12-08 DIAGNOSIS — R29898 Other symptoms and signs involving the musculoskeletal system: Secondary | ICD-10-CM | POA: Diagnosis not present

## 2022-12-08 DIAGNOSIS — Z8249 Family history of ischemic heart disease and other diseases of the circulatory system: Secondary | ICD-10-CM | POA: Diagnosis not present

## 2022-12-08 DIAGNOSIS — F411 Generalized anxiety disorder: Secondary | ICD-10-CM | POA: Diagnosis present

## 2022-12-08 DIAGNOSIS — M7121 Synovial cyst of popliteal space [Baker], right knee: Secondary | ICD-10-CM | POA: Diagnosis not present

## 2022-12-08 DIAGNOSIS — J90 Pleural effusion, not elsewhere classified: Secondary | ICD-10-CM | POA: Diagnosis not present

## 2022-12-08 DIAGNOSIS — K5730 Diverticulosis of large intestine without perforation or abscess without bleeding: Secondary | ICD-10-CM | POA: Diagnosis not present

## 2022-12-08 DIAGNOSIS — I959 Hypotension, unspecified: Secondary | ICD-10-CM | POA: Diagnosis not present

## 2022-12-08 DIAGNOSIS — D329 Benign neoplasm of meninges, unspecified: Secondary | ICD-10-CM | POA: Diagnosis present

## 2022-12-08 DIAGNOSIS — I517 Cardiomegaly: Secondary | ICD-10-CM | POA: Diagnosis not present

## 2022-12-08 DIAGNOSIS — R531 Weakness: Secondary | ICD-10-CM | POA: Diagnosis present

## 2022-12-08 DIAGNOSIS — I700 Atherosclerosis of aorta: Secondary | ICD-10-CM | POA: Diagnosis not present

## 2022-12-08 DIAGNOSIS — R918 Other nonspecific abnormal finding of lung field: Secondary | ICD-10-CM | POA: Diagnosis not present

## 2022-12-08 DIAGNOSIS — M48061 Spinal stenosis, lumbar region without neurogenic claudication: Secondary | ICD-10-CM | POA: Diagnosis not present

## 2022-12-08 DIAGNOSIS — R935 Abnormal findings on diagnostic imaging of other abdominal regions, including retroperitoneum: Secondary | ICD-10-CM | POA: Diagnosis not present

## 2022-12-08 LAB — URINALYSIS, ROUTINE W REFLEX MICROSCOPIC
Bilirubin Urine: NEGATIVE
Glucose, UA: NEGATIVE mg/dL
Hgb urine dipstick: NEGATIVE
Ketones, ur: 5 mg/dL — AB
Leukocytes,Ua: NEGATIVE
Nitrite: NEGATIVE
Protein, ur: 100 mg/dL — AB
Specific Gravity, Urine: 1.023 (ref 1.005–1.030)
pH: 6 (ref 5.0–8.0)

## 2022-12-08 LAB — CK: Total CK: 39 U/L (ref 38–234)

## 2022-12-08 LAB — COPPER, SERUM: Copper: 79 ug/dL — ABNORMAL LOW (ref 80–158)

## 2022-12-08 LAB — BRAIN NATRIURETIC PEPTIDE: B Natriuretic Peptide: 89 pg/mL (ref 0.0–100.0)

## 2022-12-08 LAB — PROCALCITONIN: Procalcitonin: 0.2 ng/mL

## 2022-12-08 LAB — CBC
HCT: 34.8 % — ABNORMAL LOW (ref 36.0–46.0)
Hemoglobin: 11.8 g/dL — ABNORMAL LOW (ref 12.0–15.0)
MCH: 29.6 pg (ref 26.0–34.0)
MCHC: 33.9 g/dL (ref 30.0–36.0)
MCV: 87.4 fL (ref 80.0–100.0)
Platelets: 136 10*3/uL — ABNORMAL LOW (ref 150–400)
RBC: 3.98 MIL/uL (ref 3.87–5.11)
RDW: 14.3 % (ref 11.5–15.5)
WBC: 5.7 10*3/uL (ref 4.0–10.5)
nRBC: 0 % (ref 0.0–0.2)

## 2022-12-08 LAB — BASIC METABOLIC PANEL
Anion gap: 11 (ref 5–15)
BUN: 12 mg/dL (ref 8–23)
CO2: 23 mmol/L (ref 22–32)
Calcium: 8.3 mg/dL — ABNORMAL LOW (ref 8.9–10.3)
Chloride: 103 mmol/L (ref 98–111)
Creatinine, Ser: 0.63 mg/dL (ref 0.44–1.00)
GFR, Estimated: >60 mL/min (ref >60)
Glucose, Bld: 112 mg/dL — ABNORMAL HIGH (ref 70–99)
Potassium: 3.7 mmol/L (ref 3.5–5.1)
Sodium: 137 mmol/L (ref 135–145)

## 2022-12-08 LAB — HEPATIC FUNCTION PANEL
ALT: 39 U/L (ref 0–44)
AST: 38 U/L (ref 15–41)
Albumin: 2.5 g/dL — ABNORMAL LOW (ref 3.5–5.0)
Alkaline Phosphatase: 58 U/L (ref 38–126)
Bilirubin, Direct: 0.2 mg/dL (ref 0.0–0.2)
Indirect Bilirubin: 0.5 mg/dL (ref 0.3–0.9)
Total Bilirubin: 0.7 mg/dL (ref 0.3–1.2)
Total Protein: 5.8 g/dL — ABNORMAL LOW (ref 6.5–8.1)

## 2022-12-08 LAB — TROPONIN I (HIGH SENSITIVITY)
Troponin I (High Sensitivity): 46 ng/L — ABNORMAL HIGH (ref <18)
Troponin I (High Sensitivity): 43 ng/L — ABNORMAL HIGH (ref <18)

## 2022-12-08 LAB — METHYLMALONIC ACID, SERUM: Methylmalonic Acid, Quantitative: 58 nmol/L (ref 0–378)

## 2022-12-08 LAB — VITAMIN B1: Vitamin B1 (Thiamine): 71.6 nmol/L (ref 66.5–200.0)

## 2022-12-08 LAB — ZINC: Zinc: 76 ug/dL (ref 44–115)

## 2022-12-08 MED ORDER — SODIUM CHLORIDE 0.9 % IV SOLN
500.0000 mg | Freq: Once | INTRAVENOUS | Status: AC
  Administered 2022-12-09: 500 mg via INTRAVENOUS
  Filled 2022-12-08: qty 5

## 2022-12-08 MED ORDER — IOHEXOL 350 MG/ML SOLN
75.0000 mL | Freq: Once | INTRAVENOUS | Status: AC | PRN
  Administered 2022-12-08: 75 mL via INTRAVENOUS

## 2022-12-08 MED ORDER — SODIUM CHLORIDE 0.9 % IV BOLUS
500.0000 mL | Freq: Once | INTRAVENOUS | Status: AC
  Administered 2022-12-08: 500 mL via INTRAVENOUS

## 2022-12-08 MED ORDER — SODIUM CHLORIDE 0.9 % IV SOLN
2.0000 g | Freq: Once | INTRAVENOUS | Status: AC
  Administered 2022-12-09: 2 g via INTRAVENOUS
  Filled 2022-12-08: qty 20

## 2022-12-08 NOTE — ED Notes (Signed)
Pt reports that she has had weakness in bilateral lower extremities.States that she is extreme difficulty walking. States that her legs feel like rubber. Pt thinks that it's the prednisone that she is taking that is causing this.

## 2022-12-08 NOTE — ED Provider Triage Note (Signed)
Emergency Medicine Provider Triage Evaluation Note  Vickie Stewart , a 77 y.o. female  was evaluated in triage.  Pt complains of weakness , cough, fatigue, still can't walk without help, brain tumor and getting radiation.  Review of Systems  Positive:  Negative:   Physical Exam  BP (!) 107/57   Pulse 80   Temp 98.8 F (37.1 C) (Oral)   Resp 18   Ht 5\' 2"  (1.575 m)   Wt 82.6 kg   SpO2 (!) 89%   BMI 33.29 kg/m  Gen:   Awake, no distress   Resp:  Normal effort  MSK:   Moves extremities without difficulty  Other:    Medical Decision Making  Medically screening exam initiated at 5:28 PM.  Appropriate orders placed.  Vickie Stewart was informed that the remainder of the evaluation will be completed by another provider, this initial triage assessment does not replace that evaluation, and the importance of remaining in the ED until their evaluation is complete.  New 02 requirement   Faythe Ghee, PA-C 12/08/22 1729

## 2022-12-08 NOTE — Telephone Encounter (Signed)
Notified pt of Dr. Michaelle Copas instructions to head to the ED. She said she would.

## 2022-12-08 NOTE — Telephone Encounter (Signed)
Pt called in today to report Having trouble eating but can't keep anything down also reports no ability to walk. Was seen in the hospital 12/04/22 for bilateral leg weakness and had MRI BRAIN W WO CONTRAST.   

## 2022-12-08 NOTE — H&P (Signed)
History and Physical    Patient: Vickie Stewart ZOX:[id number] DOB: 13-May-1946 DOA: 12/08/2022 DOS: the patient was seen and examined on 12/08/2022 PCP: Alba Cory, MD  Patient coming from: {Point_of_Origin:[address]}   Chief Complaint:  Chief Complaint  Patient presents with   Weakness    HPI: Vickie Stewart is a 77 y.o. female with medical history significant for ***   Review of Systems: ROS   Past Medical History:  Diagnosis Date   Allergic rhinitis, cause unspecified    Anxiety state, unspecified    Contact dermatitis and other eczema, due to unspecified cause    Diffuse cystic mastopathy    Dyspepsia and other specified disorders of function of stomach    Essential hypertension, benign    Glaucoma    Irritable bowel syndrome    Lateral epicondylitis  of elbow    Leukocytopenia, unspecified    Mastodynia    Obesity, unspecified    Pure hypercholesterolemia    Reflux esophagitis    Thoracic or lumbosacral neuritis or radiculitis, unspecified    Unspecified disorder of skin and subcutaneous tissue    Vaginal cancer (HCC)    History   Past Surgical History:  Procedure Laterality Date   BREAST EXCISIONAL BIOPSY Left    neg   BREAST LUMPECTOMY Left    COLONOSCOPY  08/26/2006   COLONOSCOPY WITH PROPOFOL N/A 10/21/2016   Procedure: COLONOSCOPY WITH PROPOFOL;  Surgeon: Kieth Brightly, MD;  Location: [facility] ENDOSCOPY;  Service: Endoscopy;  Laterality: N/A;   ESOPHAGOGASTRODUODENOSCOPY (EGD) WITH PROPOFOL N/A 02/07/2020   Procedure: ESOPHAGOGASTRODUODENOSCOPY (EGD) WITH PROPOFOL;  Surgeon: Toney Reil, MD;  Location: [facility] ENDOSCOPY;  Service: Gastroenterology;  Laterality: N/A;   Social History:  reports that she has never smoked. She has never used smokeless tobacco. She reports that she does not drink alcohol and does not use drugs.  Allergies  Allergen Reactions   Atorvastatin     difficulty concentrating and focusing   Terbinafine And  Related Dermatitis    Rash, itch, skin discoloration   Zetia [Ezetimibe] Other (See Comments)    Muscle/joint pain    Family History  Problem Relation Age of Onset   Pulmonary embolism Mother    Hypertension Father    Aortic stenosis Father    Breast cancer Neg Hx     Prior to Admission medications   Medication Sig Start Date End Date Taking? Authorizing Provider  aspirin 81 MG tablet Take 81 mg by mouth daily. Reported on 10/10/2015    [provider]  cholecalciferol (VITAMIN D) 1000 units tablet Take 1,000 Units by mouth daily.    [provider]  diltiazem (CARDIZEM CD) 360 MG 24 hr capsule TAKE 1 CAPSULE BY MOUTH EVERY DAY 07/10/22   Carlynn Purl, Danna Hefty, MD  latanoprost (XALATAN) 0.005 % ophthalmic solution PLACE 1 DROP IN BOTH EYES AT BEDTIME 06/20/17   [provider]  magnesium oxide (MAG-OX) 400 (240 Mg) MG tablet Take 400 mg by mouth daily.    [provider]  Multiple Vitamin (MULTIVITAMIN WITH MINERALS) TABS tablet Take 1 tablet by mouth daily. 12/06/22   Enedina Finner, MD  olmesartan (BENICAR) 20 MG tablet Take 1 tablet (20 mg total) by mouth daily. 07/10/22   Alba Cory, MD  Omega-3 Fatty Acids (FISH OIL CONCENTRATE PO) Take by mouth. Reported on 10/10/2015    [provider]  omeprazole (PRILOSEC) 20 MG capsule Take 1 capsule (20 mg total) by mouth in the morning and at bedtime. 07/10/22  Alba Cory, MD  rosuvastatin (CRESTOR) 10 MG tablet Take 1 tablet (10 mg total) by mouth daily. 01/07/22   Alba Cory, MD  sucralfate (CARAFATE) 1 g tablet Take 1 tablet (1 g total) by mouth in the morning, at noon, and at bedtime. 11/10/22   Carmina Miller, MD     Vitals:   12/08/22 1726 12/08/22 2055 12/08/22 2100 12/08/22 2205  BP:  102/67 (!) 105/56 108/67  Pulse:  65 64 63  Resp:  20 20 16   Temp:      TempSrc:      SpO2:  97% 95% 97%  Weight: 82.6 kg     Height: 5\' 2"  (1.575 m)      Physical Exam   Labs on Admission: I have  personally reviewed following labs and imaging studies  CBC: Recent Labs  Lab 12/04/22 0934 12/05/22 0615 12/08/22 1733  WBC 7.4 5.0 5.7  HGB 14.2 12.7 11.8*  HCT 42.2 37.2 34.8*  MCV 87.4 86.9 87.4  PLT 111* 98* 136*   Basic Metabolic Panel: Recent Labs  Lab 12/04/22 0934 12/05/22 0615 12/08/22 1733  NA 135 135 137  K 3.6 3.2* 3.7  CL 104 103 103  CO2 20* 23 23  GLUCOSE 236* 189* 112*  BUN 17 10 12   CREATININE 0.57 0.51 0.63  CALCIUM 8.3* 8.0* 8.3*   GFR: Estimated Creatinine Clearance: 60.5 mL/min (by C-G formula based on SCr of 0.63 mg/dL). Liver Function Tests: Recent Labs  Lab 12/08/22 1733  AST 38  ALT 39  ALKPHOS 58  BILITOT 0.7  PROT 5.8*  ALBUMIN 2.5*   No results for input(s): "LIPASE", "AMYLASE" in the last 168 hours. No results for input(s): "AMMONIA" in the last 168 hours. Coagulation Profile: No results for input(s): "INR", "PROTIME" in the last 168 hours. Cardiac Enzymes: Recent Labs  Lab 12/04/22 0934 12/08/22 1733  CKTOTAL 129 39   BNP (last 3 results) No results for input(s): "PROBNP" in the last 8760 hours. HbA1C: No results for input(s): "HGBA1C" in the last 72 hours. CBG: Recent Labs  Lab 12/04/22 1615 12/04/22 1946 12/05/22 0808 12/05/22 1233 12/05/22 1234  GLUCAP 166* 178* 173* 226* 257*   Lipid Profile: No results for input(s): "CHOL", "HDL", "LDLCALC", "TRIG", "CHOLHDL", "LDLDIRECT" in the last 72 hours. Thyroid Function Tests: No results for input(s): "TSH", "T4TOTAL", "FREET4", "T3FREE", "THYROIDAB" in the last 72 hours. Anemia Panel: No results for input(s): "VITAMINB12", "FOLATE", "FERRITIN", "TIBC", "IRON", "RETICCTPCT" in the last 72 hours. Urine analysis: Urinalysis    Component Value Date/Time   COLORURINE AMBER (A) 12/08/2022 2127   APPEARANCEUR HAZY (A) 12/08/2022 2127   LABSPEC 1.023 12/08/2022 2127   PHURINE 6.0 12/08/2022 2127   GLUCOSEU NEGATIVE 12/08/2022 2127   HGBUR NEGATIVE 12/08/2022 2127    BILIRUBINUR NEGATIVE 12/08/2022 2127   BILIRUBINUR neg 10/28/2021 1457   KETONESUR 5 (A) 12/08/2022 2127   PROTEINUR 100 (A) 12/08/2022 2127   UROBILINOGEN 0.2 10/28/2021 1457   NITRITE NEGATIVE 12/08/2022 2127   LEUKOCYTESUR NEGATIVE 12/08/2022 2127    Unresulted Labs (From admission, onward)     Start     Ordered   12/08/22 2328  SARS Coronavirus 2 by RT PCR (hospital order, performed in [facility] hospital lab) *cepheid single result test* Anterior Nasal Swab  (Tier 2 - SARS Coronavirus 2 by RT PCR (hospital order, performed in [facility] hospital lab) *cepheid single result test*)  Once,   URGENT        12/08/22 2327  12/08/22 2124  Blood culture (single)  Once,   STAT        12/08/22 2123             Radiological Exams on Admission: CT Angio Chest PE W and/or Wo Contrast  Result Date: 12/08/2022 CLINICAL DATA:  High probability for PE EXAM: CT ANGIOGRAPHY CHEST WITH CONTRAST TECHNIQUE: Multidetector CT imaging of the chest was performed using the standard protocol during bolus administration of intravenous contrast. Multiplanar CT image reconstructions and MIPs were obtained to evaluate the vascular anatomy. RADIATION DOSE REDUCTION: This exam was performed according to the departmental dose-optimization program which includes automated exposure control, adjustment of the mA and/or kV according to patient size and/or use of iterative reconstruction technique. CONTRAST:  75mL OMNIPAQUE IOHEXOL 350 MG/ML SOLN COMPARISON:  None Available. FINDINGS: Cardiovascular: The heart is enlarged. There is no pericardial effusion. The aorta is normal in size. There are atherosclerotic calcifications of the aorta. There is adequate opacification of the pulmonary arteries to the segmental level. There is no evidence for pulmonary embolism. Mediastinum/Nodes: There are diffuse nonenlarged mediastinal and hilar lymph nodes. Visualized esophagus and thyroid gland are within normal limits.  Lungs/Pleura: There are multifocal interstitial, airspace and ground-glass patchy opacities predominantly in the bilateral upper lobes but also minimally in the left lower lobe. There are trace bilateral pleural effusions. There is no pneumothorax. Trachea and central airways are patent. Upper Abdomen: Heterogeneous hypodensity throughout the right lobe of the liver is incompletely characterized on this study. Musculoskeletal: No chest wall abnormality. No acute or significant osseous findings. Review of the MIP images confirms the above findings. IMPRESSION: 1. No evidence for pulmonary embolism. 2. Multifocal interstitial, airspace and ground-glass opacities predominantly in the bilateral upper lobes but also minimally in the left lower lobe. Findings are concerning for multifocal pneumonia. COVID pneumonia can have this appearance. 3. Trace bilateral pleural effusions. 4. Cardiomegaly. 5. Heterogeneous hypodensity throughout the right lobe of the liver is incompletely characterized on this study. Differential diagnosis includes metastatic disease or infection. Recommend further evaluation with dedicated CT or MRI of the abdomen with contrast. Aortic Atherosclerosis (ICD10-I70.0). Electronically Signed   By: Darliss Cheney M.D.   On: 12/08/2022 23:15   CT HEAD WO CONTRAST ( )  Result Date: 12/08/2022 CLINICAL DATA:  Mental status change, unknown cause EXAM: CT HEAD WITHOUT CONTRAST TECHNIQUE: Contiguous axial images were obtained from the base of the skull through the vertex without intravenous contrast. RADIATION DOSE REDUCTION: This exam was performed according to the departmental dose-optimization program which includes automated exposure control, adjustment of the mA and/or kV according to patient size and/or use of iterative reconstruction technique. COMPARISON:  05/11/2020, MRI 12/04/2022 FINDINGS: Brain: 2.2 cm extra-axial low-density meningioma again seen along the right side of the falx, unchanged in  size since recent MRI. Overlying adjacent brain edema, stable. No acute infarct, hemorrhage or hydrocephalus. Vascular: No hyperdense vessel or unexpected calcification. Skull: No acute calvarial abnormality. Sinuses/Orbits: No acute findings Other: None IMPRESSION: Stable meningioma along the high right falx with mild adjacent brain edema, unchanged since recent study. No acute intracranial abnormality. Electronically Signed   By: Charlett Nose M.D.   On: 12/08/2022 22:36   DG Chest 2 View  Result Date: 12/08/2022 CLINICAL DATA:  Weakness. EXAM: CHEST - 2 VIEW COMPARISON:  Chest radiograph dated 05/11/2020. FINDINGS: Bilateral streaky interstitial densities, left greater than right, most consistent with developing infiltrate. No pleural effusion or pneumothorax. The cardiac silhouette is within normal limits. Atherosclerotic calcification  of the aortic arch. No acute osseous pathology. IMPRESSION: Developing bilateral infiltrates. Electronically Signed   By: Elgie Collard M.D.   On: 12/08/2022 18:08     Data Reviewed: Relevant notes from primary care and specialist visits, past discharge summaries as available in EHR, including Care Everywhere. Prior diagnostic testing as pertinent to current admission diagnoses Updated medications and problem lists for reconciliation ED course, including vitals, labs, imaging, treatment and response to treatment Triage notes, nursing and pharmacy notes and ED provider's notes Notable results as noted in HPI Assessment and Plan: No notes have been filed under this hospital service. Service: Hospitalist       DVT prophylaxis:  ***  Consults:  ***  Advance Care Planning:    Code Status: Prior *** Family Communication:  *** Disposition Plan:  Back to previous home environment  Severity of Illness: {Observation/Inpatient:21159}  Author: Gertha Calkin, MD 12/08/2022 11:41 PM  For on call review [email or web address].

## 2022-12-08 NOTE — ED Notes (Signed)
Unable to collect blood culture due to starting IV. MD aware and states that we do not need to stick patient again at this time for blood culture.

## 2022-12-08 NOTE — Telephone Encounter (Signed)
Copied from CRM [id number]. Topic: General - Other >> Dec 08, 2022  3:10 PM [name] wrote: Reason for CRM: Vickie Stewart with [facility] has called to share that the patient was in the hospital from 7/5 - 7/6 for lower extremity weakness and has been referred for OT and PT   The patient has requested to begin care on 12/10/22  Please contact further if needed

## 2022-12-08 NOTE — Telephone Encounter (Signed)
Copied from CRM [id number]. Topic: General - Other >> Dec 08, 2022 12:11 PM Dondra Prader A wrote: Reason for CRM: Cordelia Pen Nurse with [facility] states that the pt has been released from the hospital on 12/04/22 and is needing physical therapy and skilled nursing. Pt states that she cannot walk due to having too much prednisone. Please advise. Cordelia Pen would like for someone to call the pt back [phone].

## 2022-12-08 NOTE — Telephone Encounter (Signed)
Pt called in today to report Having trouble eating but can't keep anything down also reports no ability to walk. Was seen in the hospital 12/04/22 for bilateral leg weakness and had MRI BRAIN W WO CONTRAST.

## 2022-12-08 NOTE — ED Provider Notes (Signed)
[facility] Provider Note    Event Date/Time   First MD Initiated Contact with Patient 12/08/22 2027     (approximate)   History   Weakness   HPI  Vickie Stewart is a 76 y.o. female  here with generalized weakness. Pt reports that over the past week she has had progressive weakness. H/o meningioma receiving brain radiation and on prednisone, reports that after taking prednisone she got progressively weak to the point where she cannot walk. No other major complaints. No new headaches. No numbness. No focal weakness, it is more so bilateral LE and worse w/ attempted walking. Has had difficulty walking today. Has had a mild cough though denies orthopnea, SOB at rest.       Physical Exam   Triage Vital Signs: ED Triage Vitals  Enc Vitals Group     BP 12/08/22 1725 (!) 107/57     Pulse Rate 12/08/22 1725 80     Resp 12/08/22 1725 18     Temp 12/08/22 1725 98.8 F (37.1 C)     Temp Source 12/08/22 1725 Oral     SpO2 12/08/22 1725 (!) 89 %     Weight 12/08/22 1726 182 lb (82.6 kg)     Height 12/08/22 1726 5\' 2"  (1.575 m)     Head Circumference --      Peak Flow --      Pain Score 12/08/22 1726 0     Pain Loc --      Pain Edu? --      Excl. in GC? --     Most recent vital signs: Vitals:   12/08/22 2100 12/08/22 2205  BP: (!) 105/56 108/67  Pulse: 64 63  Resp: 20 16  Temp:    SpO2: 95% 97%     General: Awake, no distress.  CV:  Good peripheral perfusion. RRR. Resp:  Normal work of breathing. Lungs with bilateral rales. Abd:  No distention. No tenderness. Other:  1+ pitting edema b/l LE. CNII-XII intact, strength 5/5 bilateral UE and LE. Normal sensation to light touch.   ED Results / Procedures / Treatments   Labs (all labs ordered are listed, but only abnormal results are displayed) Labs Reviewed  BASIC METABOLIC PANEL - Abnormal; Notable for the following components:      Result Value   Glucose, Bld 112 (*)    Calcium 8.3  (*)    All other components within normal limits  CBC - Abnormal; Notable for the following components:   Hemoglobin 11.8 (*)    HCT 34.8 (*)    Platelets 136 (*)    All other components within normal limits  HEPATIC FUNCTION PANEL - Abnormal; Notable for the following components:   Total Protein 5.8 (*)    Albumin 2.5 (*)    All other components within normal limits  URINALYSIS, ROUTINE W REFLEX MICROSCOPIC - Abnormal; Notable for the following components:   Color, Urine AMBER (*)    APPearance HAZY (*)    Ketones, ur 5 (*)    Protein, ur 100 (*)    Bacteria, UA RARE (*)    All other components within normal limits  TROPONIN I (HIGH SENSITIVITY) - Abnormal; Notable for the following components:   Troponin I (High Sensitivity) 46 (*)    All other components within normal limits  TROPONIN I (HIGH SENSITIVITY) - Abnormal; Notable for the following components:   Troponin I (High Sensitivity) 43 (*)    All other components  within normal limits  CULTURE, BLOOD (SINGLE)  SARS CORONAVIRUS 2 BY RT PCR  BRAIN NATRIURETIC PEPTIDE  PROCALCITONIN  CK     EKG Normal sinus rhythm, VR 79. PR 148, QRS 66, QTc 385. No acute st elevations or depressions.   RADIOLOGY CT Head: NAICA CT Angio CHest: No evidence of PE, multifocal groundglass opacities concerning for multifocal pneumonia   I also independently reviewed and agree with radiologist interpretations.   PROCEDURES:  Critical Care performed: No   MEDICATIONS ORDERED IN ED: Medications  cefTRIAXone (ROCEPHIN) 2 g in sodium chloride 0.9 % 100 mL IVPB (has no administration in time range)  azithromycin (ZITHROMAX) 500 mg in sodium chloride 0.9 % 250 mL IVPB (has no administration in time range)  sodium chloride 0.9 % bolus 500 mL (500 mLs Intravenous New Bag/Given 12/08/22 2258)  iohexol (OMNIPAQUE) 350 MG/ML injection 75 mL (75 mLs Intravenous Contrast Given 12/08/22 2247)     IMPRESSION / MDM / ASSESSMENT AND PLAN / ED COURSE   I reviewed the triage vital signs and the nursing notes.                              Differential diagnosis includes, but is not limited to, generalized weakness secondary to occult infection such as UTI, pneumonia, steroid induced myopathy or medication effect, deconditioning, anemia,  Patient's presentation is most consistent with acute presentation with potential threat to life or bodily function.  The patient is on the cardiac monitor to evaluate for evidence of arrhythmia and/or significant heart rate changes  77 year old female with history of meningioma currently receiving brain radiation on prednisone here with generalized weakness.  Clinically, suspect possible steroid-induced myopathy versus weakness secondary to pneumonia.  Chest x-ray shows bilateral infiltrates and CT scan obtained, reviewed, shows multifocal pneumonia.  Concern for possible COVID and a COVID lab has been sent.  Otherwise, steroid-induced myopathy remains a consideration.  The patient does appear dehydrated as well.  Urinalysis with ketonuria.  IV fluids given.  Troponin 43, stable and likely due to demand.  EKG shows nonspecific changes.  BNP is normal.  Pro-Cal is negative.  Will admit for management of generalized weakness, possible COVID-pneumonia vs atypical CA PNA    FINAL CLINICAL IMPRESSION(S) / ED DIAGNOSES   Final diagnoses:  Generalized weakness  Multifocal pneumonia     Rx / DC Orders   ED Discharge Orders     None        Note:  This document was prepared using Dragon voice recognition software and may include unintentional dictation errors.   Shaune Pollack, MD 12/08/22 [phone]

## 2022-12-08 NOTE — Telephone Encounter (Signed)
Error - duplicate call under incorrect provider

## 2022-12-08 NOTE — Telephone Encounter (Deleted)
Erroneous documentation

## 2022-12-08 NOTE — ED Notes (Signed)
Pt gone to CT 

## 2022-12-09 ENCOUNTER — Observation Stay: Payer: Medicare PPO

## 2022-12-09 ENCOUNTER — Encounter: Payer: Self-pay | Admitting: Internal Medicine

## 2022-12-09 ENCOUNTER — Other Ambulatory Visit: Payer: Self-pay | Admitting: Family Medicine

## 2022-12-09 DIAGNOSIS — K5730 Diverticulosis of large intestine without perforation or abscess without bleeding: Secondary | ICD-10-CM | POA: Diagnosis not present

## 2022-12-09 DIAGNOSIS — J9601 Acute respiratory failure with hypoxia: Secondary | ICD-10-CM | POA: Diagnosis present

## 2022-12-09 DIAGNOSIS — M48061 Spinal stenosis, lumbar region without neurogenic claudication: Secondary | ICD-10-CM | POA: Diagnosis not present

## 2022-12-09 DIAGNOSIS — R531 Weakness: Secondary | ICD-10-CM | POA: Diagnosis not present

## 2022-12-09 DIAGNOSIS — J189 Pneumonia, unspecified organism: Secondary | ICD-10-CM | POA: Diagnosis present

## 2022-12-09 DIAGNOSIS — R29898 Other symptoms and signs involving the musculoskeletal system: Secondary | ICD-10-CM | POA: Diagnosis not present

## 2022-12-09 DIAGNOSIS — M7121 Synovial cyst of popliteal space [Baker], right knee: Secondary | ICD-10-CM | POA: Diagnosis not present

## 2022-12-09 DIAGNOSIS — K429 Umbilical hernia without obstruction or gangrene: Secondary | ICD-10-CM | POA: Diagnosis not present

## 2022-12-09 DIAGNOSIS — M5126 Other intervertebral disc displacement, lumbar region: Secondary | ICD-10-CM | POA: Diagnosis not present

## 2022-12-09 DIAGNOSIS — M5124 Other intervertebral disc displacement, thoracic region: Secondary | ICD-10-CM | POA: Diagnosis not present

## 2022-12-09 LAB — CBC
HCT: 30.7 % — ABNORMAL LOW (ref 36.0–46.0)
Hemoglobin: 10.1 g/dL — ABNORMAL LOW (ref 12.0–15.0)
MCH: 29.4 pg (ref 26.0–34.0)
MCHC: 32.9 g/dL (ref 30.0–36.0)
MCV: 89.5 fL (ref 80.0–100.0)
Platelets: 136 10*3/uL — ABNORMAL LOW (ref 150–400)
RBC: 3.43 MIL/uL — ABNORMAL LOW (ref 3.87–5.11)
RDW: 14.6 % (ref 11.5–15.5)
WBC: 5.4 10*3/uL (ref 4.0–10.5)
nRBC: 0 % (ref 0.0–0.2)

## 2022-12-09 LAB — TROPONIN I (HIGH SENSITIVITY)
Troponin I (High Sensitivity): 50 ng/L — ABNORMAL HIGH (ref <18)
Troponin I (High Sensitivity): 42 ng/L — ABNORMAL HIGH (ref <18)

## 2022-12-09 LAB — GLUCOSE, CAPILLARY
Glucose-Capillary: 115 mg/dL — ABNORMAL HIGH (ref 70–99)
Glucose-Capillary: 122 mg/dL — ABNORMAL HIGH (ref 70–99)
Glucose-Capillary: 120 mg/dL — ABNORMAL HIGH (ref 70–99)
Glucose-Capillary: 119 mg/dL — ABNORMAL HIGH (ref 70–99)

## 2022-12-09 LAB — RESPIRATORY PANEL BY PCR

## 2022-12-09 LAB — LACTIC ACID, PLASMA
Lactic Acid, Venous: 0.9 mmol/L (ref 0.5–1.9)
Lactic Acid, Venous: 1.4 mmol/L (ref 0.5–1.9)

## 2022-12-09 LAB — SARS CORONAVIRUS 2 BY RT PCR: SARS Coronavirus 2 by RT PCR: NEGATIVE

## 2022-12-09 LAB — PROTIME-INR
INR: 1.2 (ref 0.8–1.2)
Prothrombin Time: 15.6 seconds — ABNORMAL HIGH (ref 11.4–15.2)

## 2022-12-09 LAB — TSH: TSH: 0.743 u[IU]/mL (ref 0.350–4.500)

## 2022-12-09 LAB — SEDIMENTATION RATE: Sed Rate: 81 mm/hr — ABNORMAL HIGH (ref 0–30)

## 2022-12-09 LAB — STREP PNEUMONIAE URINARY ANTIGEN: Strep Pneumo Urinary Antigen: NEGATIVE

## 2022-12-09 LAB — CREATININE, SERUM
Creatinine, Ser: 0.65 mg/dL (ref 0.44–1.00)
GFR, Estimated: >60 mL/min (ref >60)

## 2022-12-09 LAB — MRSA NEXT GEN BY PCR, NASAL: MRSA by PCR Next Gen: NOT DETECTED

## 2022-12-09 LAB — CORTISOL-AM, BLOOD: Cortisol - AM: 20.6 ug/dL (ref 6.7–22.6)

## 2022-12-09 MED ORDER — HEPARIN SODIUM (PORCINE) 5000 UNIT/ML IJ SOLN
5000.0000 [IU] | Freq: Three times a day (TID) | INTRAMUSCULAR | Status: DC
  Administered 2022-12-09 – 2022-12-14 (×17): 5000 [IU] via SUBCUTANEOUS
  Filled 2022-12-09 (×17): qty 1

## 2022-12-09 MED ORDER — PANTOPRAZOLE SODIUM 40 MG PO TBEC
40.0000 mg | DELAYED_RELEASE_TABLET | Freq: Every day | ORAL | Status: DC
  Administered 2022-12-09 – 2022-12-14 (×6): 40 mg via ORAL
  Filled 2022-12-09 (×6): qty 1

## 2022-12-09 MED ORDER — LATANOPROST 0.005 % OP SOLN
1.0000 [drp] | Freq: Every day | OPHTHALMIC | Status: DC
  Administered 2022-12-10 – 2022-12-14 (×5): 1 [drp] via OPHTHALMIC
  Filled 2022-12-09: qty 2.5

## 2022-12-09 MED ORDER — DIAZEPAM 5 MG/ML IJ SOLN
2.5000 mg | Freq: Once | INTRAMUSCULAR | Status: AC
  Administered 2022-12-09: 2.5 mg via INTRAVENOUS
  Filled 2022-12-09: qty 2

## 2022-12-09 MED ORDER — INSULIN ASPART 100 UNIT/ML IJ SOLN
0.0000 [IU] | Freq: Three times a day (TID) | INTRAMUSCULAR | Status: DC
  Administered 2022-12-10: 3 [IU] via SUBCUTANEOUS
  Administered 2022-12-11 – 2022-12-12 (×2): 2 [IU] via SUBCUTANEOUS
  Administered 2022-12-12: 3 [IU] via SUBCUTANEOUS
  Administered 2022-12-13: 2 [IU] via SUBCUTANEOUS
  Administered 2022-12-13: 3 [IU] via SUBCUTANEOUS
  Administered 2022-12-14: 2 [IU] via SUBCUTANEOUS
  Administered 2022-12-14: 3 [IU] via SUBCUTANEOUS
  Administered 2022-12-15: 2 [IU] via SUBCUTANEOUS
  Filled 2022-12-09 (×10): qty 1

## 2022-12-09 MED ORDER — METRONIDAZOLE 500 MG/100ML IV SOLN
500.0000 mg | Freq: Two times a day (BID) | INTRAVENOUS | Status: DC
  Administered 2022-12-09: 500 mg via INTRAVENOUS
  Filled 2022-12-09 (×2): qty 100

## 2022-12-09 MED ORDER — GADOBUTROL 1 MMOL/ML IV SOLN
8.0000 mL | Freq: Once | INTRAVENOUS | Status: AC | PRN
  Administered 2022-12-09: 8 mL via INTRAVENOUS

## 2022-12-09 MED ORDER — SUCRALFATE 1 G PO TABS
1.0000 g | ORAL_TABLET | Freq: Three times a day (TID) | ORAL | Status: DC
  Administered 2022-12-09 – 2022-12-15 (×23): 1 g via ORAL
  Filled 2022-12-09 (×23): qty 1

## 2022-12-09 MED ORDER — ROSUVASTATIN CALCIUM 10 MG PO TABS
10.0000 mg | ORAL_TABLET | Freq: Every day | ORAL | Status: DC
  Administered 2022-12-09 – 2022-12-15 (×7): 10 mg via ORAL
  Filled 2022-12-09 (×7): qty 1

## 2022-12-09 MED ORDER — IOHEXOL 300 MG/ML  SOLN
100.0000 mL | Freq: Once | INTRAMUSCULAR | Status: AC | PRN
  Administered 2022-12-09: 100 mL via INTRAVENOUS

## 2022-12-09 MED ORDER — VANCOMYCIN HCL 1250 MG/250ML IV SOLN: 1250.0000 mg | INTRAVENOUS | Status: DC

## 2022-12-09 MED ORDER — ADULT MULTIVITAMIN W/MINERALS CH
1.0000 | ORAL_TABLET | Freq: Every day | ORAL | Status: DC
  Administered 2022-12-09 – 2022-12-15 (×7): 1 via ORAL
  Filled 2022-12-09 (×7): qty 1

## 2022-12-09 MED ORDER — ASPIRIN 81 MG PO TBEC
81.0000 mg | DELAYED_RELEASE_TABLET | Freq: Every day | ORAL | Status: DC
  Administered 2022-12-09 – 2022-12-14 (×6): 81 mg via ORAL
  Filled 2022-12-09 (×6): qty 1

## 2022-12-09 MED ORDER — VITAMIN D 25 MCG (1000 UNIT) PO TABS
1000.0000 [IU] | ORAL_TABLET | Freq: Every day | ORAL | Status: DC
  Administered 2022-12-09 – 2022-12-15 (×7): 1000 [IU] via ORAL
  Filled 2022-12-09 (×7): qty 1

## 2022-12-09 MED ORDER — SODIUM CHLORIDE 0.9 % IV SOLN
2.0000 g | Freq: Two times a day (BID) | INTRAVENOUS | Status: DC
  Administered 2022-12-09: 2 g via INTRAVENOUS
  Filled 2022-12-09: qty 12.5

## 2022-12-09 MED ORDER — VANCOMYCIN HCL 1750 MG/350ML IV SOLN
1750.0000 mg | Freq: Once | INTRAVENOUS | Status: AC
  Administered 2022-12-09: 1750 mg via INTRAVENOUS
  Filled 2022-12-09: qty 350

## 2022-12-09 MED ORDER — IOHEXOL 9 MG/ML PO SOLN
500.0000 mL | ORAL | Status: AC
  Administered 2022-12-09 (×2): 500 mL via ORAL

## 2022-12-09 NOTE — Assessment & Plan Note (Signed)
Cpap per home settings.  

## 2022-12-09 NOTE — Progress Notes (Signed)
Pharmacy Antibiotic Note  Vickie Stewart is a 77 y.o. female admitted on 12/08/2022 with infection from unknown source.  Pharmacy has been consulted for Cefepime & Vancomycin dosing for 7 days.  Plan: Cefepime 2 gm q12hr per indication & renal fxn based on SCr 0.8.  Pt given Vancomycin 1750 mg once. Vancomycin 1250 mg IV Q 24 hrs. Goal AUC 400-550. Expected AUC: 476.6 SCr used: 0.8 (7/10 Scr 0.65), Vd used: 0.72  Pharmacy will continue to follow and will adjust abx dosing whenever warranted.  Temp (24hrs), Avg:98.8 F (37.1 C), Min:98.8 F (37.1 C), Max:98.8 F (37.1 C)   Recent Labs  Lab 12/04/22 0934 12/05/22 0615 12/08/22 1733 12/09/22 0213  WBC 7.4 5.0 5.7 5.4  CREATININE 0.57 0.51 0.63 0.65  LATICACIDVEN  --   --   --  0.9    Estimated Creatinine Clearance: 60.5 mL/min (by C-G formula based on SCr of 0.65 mg/dL).    Allergies  Allergen Reactions   Atorvastatin     difficulty concentrating and focusing   Terbinafine And Related Dermatitis    Rash, itch, skin discoloration   Zetia [Ezetimibe] Other (See Comments)    Muscle/joint pain    Antimicrobials this admission: 07/09 Azithromycin >> x 1 dose 07/09 Ceftriaxone >> x 1 dose 07/10 Cefepime >> x 7 days 07/10 Flagyl >> x 7 days 07/10 Vancomycin >> x 7 days  Microbiology results: 7/10 BCx: Pending  Thank you for allowing pharmacy to be a part of this patient's care.  Otelia Sergeant, PharmD, MBA 12/09/2022 3:00 AM

## 2022-12-09 NOTE — Evaluation (Signed)
Physical Therapy Evaluation Patient Details Name: Vickie Stewart MRN: [id number] DOB: [birth year] Today's Date: 12/09/2022  History of Present Illness  Pt is a 77 y/o F admitted on 12/08/22 after presenting with c/o BLE weakness. MRI showed "small enhancing lesions in the right posterior elements at T6 & T10, as well as possibly at L1." PMH: meningioma of the right parafalcine region s/p IMRT, HTN, HLD, anxiety, glaucoma, obesity, vaginal CA, thoracic or lumbosacral neuritis or radiculitis  Clinical Impression  Pt seen for PT evaluation with pt agreeable to tx. Pt is able to complete bed mobility with supervision but movement is effortful for pt & pt reliant on hospital bed features. Pt requires min assist for transfers & CGA for short distance gait in room with RW. Pt presents with impaired gait pattern & BLE muscle weakness. Pt also endorses back pain. Pt unsafe to d/c home alone at this time. Recommend ongoing PT services in post acute setting <3 hours/day.        Assistance Recommended at Discharge Intermittent Supervision/Assistance  If plan is discharge home, recommend the following:  Can travel by private vehicle  A little help with walking and/or transfers;A little help with bathing/dressing/bathroom;Assistance with cooking/housework;Help with stairs or ramp for entrance;Assist for transportation   Yes    Equipment Recommendations None recommended by PT  Recommendations for Other Services       Functional Status Assessment Patient has had a recent decline in their functional status and demonstrates the ability to make significant improvements in function in a reasonable and predictable amount of time.     Precautions / Restrictions Precautions Precautions: Fall Restrictions Weight Bearing Restrictions: No      Mobility  Bed Mobility   Bed Mobility: Supine to Sit     Supine to sit: Supervision, HOB elevated     General bed mobility comments: extra time to bring  BLE to EOB & Upright trunk to sitting, use of bed rails, HOB elevated    Transfers Overall transfer level: Needs assistance Equipment used: None, Rolling walker (2 wheels) Transfers: Sit to/from Stand, Bed to chair/wheelchair/BSC Sit to Stand: Min assist   Step pivot transfers: Min assist       General transfer comment: STS from EOB, BSC, recliner with min assist & cuing re: hand placement during sit<>stand. Stand pivot bed>BSC>recliner without AD with PRN HHA & min assist.    Ambulation/Gait Ambulation/Gait assistance: Min guard Gait Distance (Feet): 25 Feet Assistive device: Rolling walker (2 wheels) Gait Pattern/deviations: Decreased step length - right, Decreased step length - left, Decreased dorsiflexion - right, Decreased dorsiflexion - left, Decreased stride length Gait velocity: decreased     General Gait Details: decreased heel strike (R worse than L), decreased BLE foot clearance  Stairs            Wheelchair Mobility     Tilt Bed    Modified Rankin (Stroke Patients Only)       Balance Overall balance assessment: Needs assistance Sitting-balance support: No upper extremity supported, Feet supported Sitting balance-Leahy Scale: Good Sitting balance - Comments: peri hygiene sitting on BSC without LOB   Standing balance support: Bilateral upper extremity supported, During functional activity, Reliant on assistive device for balance Standing balance-Leahy Scale: Fair                               Pertinent Vitals/Pain Pain Assessment Pain Assessment: 0-10 Pain Score: 8  Pain Location: back  Pain Descriptors / Indicators: Discomfort Pain Intervention(s): Monitored during session    Home Living Family/patient expects to be discharged to:: Private residence Living Arrangements: Alone Available Help at Discharge: Family;Available PRN/intermittently (brother) Type of Home: House Home Access: Stairs to enter Entrance Stairs-Rails:  Doctor, general practice of Steps: 4 Alternate Level Stairs-Number of Steps: 2 Home Layout: Multi-level (split level) Home Equipment: Shower seat;Grab bars - toilet;Grab bars - tub/shower      Prior Function               Mobility Comments: Prior to last admission pt was independent without AD, driving, but has had steady decline ADLs Comments: (I) BADL/IADL. Has not driven for about 2 weeks due to fear from LE weakness. Brother assists with IADLs as needed.     Hand Dominance   Dominant Hand: Right    Extremity/Trunk Assessment   Upper Extremity Assessment Upper Extremity Assessment: Overall WFL for tasks assessed            Communication   Communication: No difficulties  Cognition Arousal/Alertness: Awake/alert Behavior During Therapy: WFL for tasks assessed/performed Overall Cognitive Status: Within Functional Limits for tasks assessed                                 General Comments: agreeable to participate, eager to get better        General Comments General comments (skin integrity, edema, etc.): Pt on 2L/min via nasal cannula throughout session. Pt with continent void & BM on [facility], requires assistance to ensure thoroughness with peri hygiene.    Exercises     Assessment/Plan    PT Assessment Patient needs continued PT services  PT Problem List Decreased strength;Cardiopulmonary status limiting activity;Pain;Decreased activity tolerance;Decreased knowledge of use of DME;Decreased balance;Decreased safety awareness;Decreased mobility       PT Treatment Interventions DME instruction;Gait training;Functional mobility training;Therapeutic activities;Therapeutic exercise;Balance training;Neuromuscular re-education;Stair training;Patient/family education    PT Goals (Current goals can be found in the Care Plan section)  Acute Rehab PT Goals Patient Stated Goal: to be able to walk before she goes home PT Goal Formulation: With  patient Time For Goal Achievement: 12/23/22 Potential to Achieve Goals: Good    Frequency Min 1X/week     Co-evaluation               AM-PAC PT "6 Clicks" Mobility  Outcome Measure Help needed turning from your back to your side while in a flat bed without using bedrails?: A Little Help needed moving from lying on your back to sitting on the side of a flat bed without using bedrails?: A Little Help needed moving to and from a bed to a chair (including a wheelchair)?: A Little Help needed standing up from a chair using your arms (e.g., wheelchair or bedside chair)?: A Little Help needed to walk in hospital room?: A Little Help needed climbing 3-5 steps with a railing? : A Lot 6 Click Score: 17    End of Session Equipment Utilized During Treatment: Oxygen Activity Tolerance: Patient tolerated treatment well Patient left: in chair;with chair alarm set;with call bell/phone within reach Nurse Communication: Mobility status PT Visit Diagnosis: Unsteadiness on feet (R26.81);Other abnormalities of gait and mobility (R26.89);Muscle weakness (generalized) (M62.81);Difficulty in walking, not elsewhere classified (R26.2);Pain Pain - part of body:  (back)    Time: 4098-1191 PT Time Calculation (min) (ACUTE ONLY): 29 min   Charges:   PT Evaluation $PT Eval  Low Complexity: 1 Low   PT General Charges $$ ACUTE PT VISIT: 1 Visit         Aleda Grana, PT, DPT 12/09/22, 10:57 AM   Sandi Mariscal 12/09/2022, 10:55 AM

## 2022-12-09 NOTE — Progress Notes (Signed)
Initial Nutrition Assessment  DOCUMENTATION CODES:   Obesity unspecified  INTERVENTION:  - Add MVI q day.   NUTRITION DIAGNOSIS:   Inadequate oral intake related to poor appetite as evidenced by meal completion < 25%, per patient/family report.  GOAL:   Patient will meet greater than or equal to 90% of their needs  MONITOR:   PO intake  REASON FOR ASSESSMENT:   Malnutrition Screening Tool, Consult Assessment of nutrition requirement/status  ASSESSMENT:   77 y.o. female admits related to weakness. PMH includes: HTN, HLD, reflux esophagitis. Pt is currently receiving medical management related to lower extremity weakness.  Meds reviewed: Vit D3; sliding scale insulin, carafate. Labs reviewed: WDL.   The pt reports that she does not have an appetite and has not had one for the past month. No significant wt loss per record. Pt denies and N/V, diarrhea and constipation. Pt also denies supplements. Will continue to monitor PO intakes.  NUTRITION - FOCUSED PHYSICAL EXAM:  WDL - no wasting noted.  Diet Order:   Diet Order             Diet Carb Modified Fluid consistency: Thin; Room service appropriate? Yes  Diet effective now                   EDUCATION NEEDS:   Not appropriate for education at this time  Skin:  Skin Assessment: Reviewed RN Assessment  Last BM:  7/10 - type 7  Height:   Ht Readings from Last 1 Encounters:  12/08/22 5\' 2"  (1.575 m)    Weight:   Wt Readings from Last 1 Encounters:  12/08/22 82.6 kg    Ideal Body Weight:     BMI:  Body mass index is 33.29 kg/m.  Estimated Nutritional Needs:   Kcal:  1610-9604 kcals  Protein:  80-105 gm  Fluid:  >/= 1.6 L  Bethann Humble, RD, LDN, CNSC.

## 2022-12-09 NOTE — Consult Note (Signed)
Consulting Department:  Inpatient medicine   Primary Physician:  Alba Cory, MD  Chief Complaint: Decreased ambulatory function and bilateral lower extremity weakness   History of Present Illness: 12/09/2022 Vickie Stewart is a 77 y.o. female who presents with the chief complaint of bilateral lower extremity weakness.  She has a history of a meningioma for which she has received radiation.  She was also recently placed on a steroid taper.  She states that almost immediately after starting her steroid she developed proximal lower extremity weakness and difficulty with ambulation.  She is not having any new headache.  She does have some back and girdle pain.  She is not having any new radiating numbness or tingling in her lower extremities.  No new bowel or bladder symptoms.  No persistent radiating lower extremity pain.  She was recently admitted and evaluated and then discharged.  She was discharged home and had a repeat admission due to difficulty with ambulation.  Review of Systems:  A 10 point review of systems is negative, except for the pertinent positives and negatives detailed in the HPI.  Past Medical History: Past Medical History:  Diagnosis Date   Allergic rhinitis, cause unspecified    Anxiety state, unspecified    Contact dermatitis and other eczema, due to unspecified cause    Diffuse cystic mastopathy    Dyspepsia and other specified disorders of function of stomach    Essential hypertension, benign    Glaucoma    Irritable bowel syndrome    Lateral epicondylitis  of elbow    Leukocytopenia, unspecified    Mastodynia    Obesity, unspecified    Pure hypercholesterolemia    Reflux esophagitis    Thoracic or lumbosacral neuritis or radiculitis, unspecified    Unspecified disorder of skin and subcutaneous tissue    Vaginal cancer (HCC)    History    Past Surgical History: Past Surgical History:  Procedure Laterality Date   BREAST EXCISIONAL BIOPSY Left     neg   BREAST LUMPECTOMY Left    COLONOSCOPY  08/26/2006   COLONOSCOPY WITH PROPOFOL N/A 10/21/2016   Procedure: COLONOSCOPY WITH PROPOFOL;  Surgeon: Kieth Brightly, MD;  Location: [facility] ENDOSCOPY;  Service: Endoscopy;  Laterality: N/A;   ESOPHAGOGASTRODUODENOSCOPY (EGD) WITH PROPOFOL N/A 02/07/2020   Procedure: ESOPHAGOGASTRODUODENOSCOPY (EGD) WITH PROPOFOL;  Surgeon: Toney Reil, MD;  Location: [facility] ENDOSCOPY;  Service: Gastroenterology;  Laterality: N/A;    Allergies: Allergies as of 12/08/2022 - Review Complete 12/08/2022  Allergen Reaction Noted   Atorvastatin  07/26/2015   Terbinafine and related Dermatitis 01/23/2016   Zetia [ezetimibe] Other (See Comments) 07/10/2022    Medications:  Current Facility-Administered Medications:    aspirin EC tablet 81 mg, 81 mg, Oral, Daily, Irena Cords V, MD, 81 mg at 12/09/22 9811   cholecalciferol (VITAMIN D3) 25 MCG (1000 UNIT) tablet 1,000 Units, 1,000 Units, Oral, Daily, Irena Cords V, MD, 1,000 Units at 12/09/22 0952   heparin injection 5,000 Units, 5,000 Units, Subcutaneous, Q8H, Irena Cords V, MD, 5,000 Units at 12/09/22 1421   insulin aspart (novoLOG) injection 0-15 Units, 0-15 Units, Subcutaneous, TID WC, Patel, Ekta V, MD   iohexol (OMNIPAQUE) 9 MG/ML oral solution 500 mL, 500 mL, Oral, Q1H, Sreenath, Sudheer B, MD, 500 mL at 12/09/22 1524   latanoprost (XALATAN) 0.005 % ophthalmic solution 1 drop, 1 drop, Right Eye, QHS, Sreenath, Sudheer B, MD   multivitamin with minerals tablet 1 tablet, 1 tablet, Oral, Daily, Sreenath, Sudheer B, MD, 1 tablet  at 12/09/22 1523   pantoprazole (PROTONIX) EC tablet 40 mg, 40 mg, Oral, Daily, Sreenath, Sudheer B, MD, 40 mg at 12/09/22 1421   rosuvastatin (CRESTOR) tablet 10 mg, 10 mg, Oral, Daily, Sreenath, Sudheer B, MD, 10 mg at 12/09/22 1421   sucralfate (CARAFATE) tablet 1 g, 1 g, Oral, TID WC & HS, Sreenath, Sudheer B, MD   Social History: Social History   Tobacco Use   Smoking  status: Never   Smokeless tobacco: Never   Tobacco comments:    experimented with dip snuff as a child  Vaping Use   Vaping Use: Never used  Substance Use Topics   Alcohol use: No   Drug use: No    Family Medical History: Family History  Problem Relation Age of Onset   Pulmonary embolism Mother    Hypertension Father    Aortic stenosis Father    Breast cancer Neg Hx     Physical Examination: Vitals:   12/09/22 0733 12/09/22 1526  BP: (!) 99/45 101/77  Pulse: 67 78  Resp: 17 17  Temp: 98.9 F (37.2 C) 98.6 F (37 C)  SpO2: 95% 97%     General: Patient is well developed, well nourished, calm, collected, and in no apparent distress.  NEUROLOGICAL:  General: In no acute distress.   Awake, alert, oriented to person, place, and time.  Pupils equal round and reactive to light.  Full Facial tone is symmetric.  Tongue protrusion is midline.  Bilateral upper extremities are full strength proximally and distally.  There is no pronator drift.  Language is conversant.  GCS:15  Strength in the bilateral lower extremities demonstrates full strength in plantarflexion dorsiflexion knee extension bilaterally.  She does have left greater than right hip flexion weakness which is complicated by some pain.  When given some assistance she is able to activate and hold it much stronger than she was when initiating it from a full extended position.  Bilateral upper and lower extremity sensation is intact to light touch.  Reflexes are 1+ in the bilateral lower extremities  Imaging: Narrative & Impression  CLINICAL DATA:  Ataxia, concern for metastatic disease   EXAM: MRI THORACIC AND LUMBAR SPINE WITHOUT AND WITH CONTRAST   TECHNIQUE: Multiplanar and multiecho pulse sequences of the thoracic and lumbar spine were obtained without and with intravenous contrast.   CONTRAST:  8mL GADAVIST GADOBUTROL 1 MMOL/ML IV SOLN   COMPARISON:  No prior MRI of the thoracic or lumbar spine  available.   FINDINGS: MRI THORACIC SPINE FINDINGS   Alignment: Preservation of the normal thoracic kyphosis. No listhesis. Mild S-shaped curvature of the thoracolumbar spine.   Vertebrae: No acute fracture or evidence of discitis. T2 hyperintense, contrast-enhancing lesions in the right posterior elements at T6 (series 13, image 5 and series 8, image 5), and T10 (series 13, image 6 and series 8, image 6) could represent metastatic lesions in the T5 right pedicle and transverse process and T10 right facet. A T1 isointense, mildly T2 hyperintense, and minimally enhancing lesion in the T10 vertebral body is nonspecific and could represent a suspicious lesion or an atypical hemangioma.   Cord:  Normal signal and morphology.  No abnormal enhancement.   Paraspinal and other soft tissues: Trace bilateral pleural effusions. Airspace opacities are better visualized on 12/08/2022 CTA chest.   Disc levels:   Mild degenerative changes without significant spinal canal stenosis or neural foraminal narrowing.   MRI LUMBAR SPINE FINDINGS   Segmentation:  5 lumbar type vertebral  bodies.   Alignment: No significant listhesis. Preservation of the normal thoracic lordosis. Mild levocurvature.   Vertebrae: No acute fracture or evidence of discitis. 5 mm focus of increased T2 signal and enhancement in the L1 vertebral body (series 6, image 11), which is nonspecific but could represent a metastatic lesion   Conus medullaris: Extends to the L1-L2 level and appears normal. No abnormal enhancement.   Paraspinal and other soft tissues: Atrophy of the inferior paraspinous musculature. No lymphadenopathy.   Disc levels:   T12-L1: No significant disc bulge. No spinal canal stenosis or neural foraminal narrowing.   L1-L2: No significant disc bulge. No spinal canal stenosis or neural foraminal narrowing.   L2-L3: No significant disc bulge. No spinal canal stenosis or neural foraminal  narrowing.   L3-L4: Left foraminal and extreme lateral disc protrusion. No spinal canal stenosis or neural foraminal narrowing.   L4-L5: Mild disc bulge. Mild facet arthropathy. Narrowing of the lateral recesses. No spinal canal stenosis. Mild bilateral neural foraminal narrowing.   L5-S1: Minimal disc bulge with left subarticular and foraminal protrusion. No spinal canal stenosis. Mild left neural foraminal narrowing.   IMPRESSION: 1. Small enhancing lesions in the right posterior elements at T6 and T10, as well as possibly at L1, which could represent metastatic disease. A lesion in the T10 vertebral body has a slightly different signal characteristics and could represent a suspicious lesion or an atypical hemangioma. 2. No spinal canal stenosis or neural foraminal narrowing in the thoracic spine. 3. L4-L5 mild bilateral neural foraminal narrowing. Narrowing of the lateral recesses at this level could affect the descending L5 nerve roots. 4. L5-S1 mild left neural foraminal narrowing.     Electronically Signed   By: Wiliam Ke M.D.   On: 12/09/2022 03:41   Narrative & Impression  CLINICAL DATA:  Motor neuron disease suspected   EXAM: MRI HEAD WITHOUT AND WITH CONTRAST   TECHNIQUE: Multiplanar, multiecho pulse sequences of the brain and surrounding structures were obtained without and with intravenous contrast.   CONTRAST:  7.50mL GADAVIST GADOBUTROL 1 MMOL/ML IV SOLN   COMPARISON:  MRI head April 22, 2021.   FINDINGS: Brain: Homogeneously enhancing 2.1 x 1.5 cm dural based enhancing mass along the high right falx (series 16, image 126), probably a meningioma. There is mild adjacent brain edema and mass effect. The mass is inferior to the superior sagittal sinus. No other pathologic enhancement. No evidence of acute infarct, acute hemorrhage, midline shift or hydrocephalus.   Vascular: Major arterial flow voids are maintained at the skull base.   Skull  and upper cervical spine: Normal marrow signal.   Sinuses/Orbits: Clear sinuses.  No acute orbital findings.   Other: No mastoid effusions.   IMPRESSION: Similar Putative 2.1 cm meningioma along the high right falx. Mild adjacent brain edema and mass effect is similar.     Electronically Signed   By: Feliberto Harts M.D.   On: 12/04/2022 14:33    I have personally reviewed the images and agree with the above interpretation.  Labs:    Latest Ref Rng & Units 12/09/2022    2:13 AM 12/08/2022    5:33 PM 12/05/2022    6:15 AM  CBC  WBC 4.0 - 10.5 K/uL 5.4  5.7  5.0   Hemoglobin 12.0 - 15.0 g/dL 16.1  09.6  04.5   Hematocrit 36.0 - 46.0 % 30.7  34.8  37.2   Platelets 150 - 400 K/uL 136  136  98  Assessment and Plan: Weakness of both Lower Extremities   Vickie Stewart is a pleasant 77 y.o. female with history of a meningioma status post radiation.  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  She was discharged with home health but unfortunately had a representation to the emergency department for inability to ambulate.  She continues to have stable strength when compared to her previous evaluation.  Is left greater than right mostly proximal strength loss.  No sensory dysfunction no nerve radiation pain.  She does have some pain in her back, has gotten imaging which demonstrated a few posterior element lesions.  These were reportedly different than the atypical hemangioma she noted on the vertebral bodies themselves.  At this point there is nothing compressive that would be causing the weakness that she is developing.  We do recommend nutrition optimization as well as continued therapeutic efforts.  For these posterior element lesions should consider a image guided biopsy as the MRI read suggested some concern for a malignant etiology.  At this point there is no indication for emergent neurosurgical intervention.  Lovenia Kim,  MD/MSCR Dept. of Neurosurgery

## 2022-12-09 NOTE — Assessment & Plan Note (Signed)
SpO2: 97 % Pt on 2 L [place].  2/2 to PNA. Pulmonary consult if covid negative.

## 2022-12-09 NOTE — Assessment & Plan Note (Addendum)
Being followed with [facility] neurology and pt receiving RT.

## 2022-12-09 NOTE — Assessment & Plan Note (Signed)
Suspect atypical / ? Covid.  Cont with [employer] IV abx.  Pharmacy consulted.

## 2022-12-09 NOTE — ED Notes (Signed)
Pt taken to MRI at this time, will be transferred to inpatient room from there. Pt given one time dose valium for anxiety for MRI. Pt in stable condition with no noted acute distress or concerns.

## 2022-12-09 NOTE — Assessment & Plan Note (Signed)
Vitals:   12/08/22 1725 12/08/22 2055 12/08/22 2100 12/08/22 2205  BP: (!) 107/57 102/67 (!) 105/56 108/67   12/09/22 0100  BP: (!) 104/58  BP soft we will hold diltiazem and benicar.

## 2022-12-09 NOTE — Progress Notes (Signed)
PROGRESS NOTE    Vickie Stewart  ZOX:[id number] DOB: [birth year] DOA: 12/08/2022 PCP: Alba Cory, MD    Brief Narrative:  77 y.o. female with medical history significant for meningioma of the right parafalcine region s/p IMRT, hypertension, hyperlipidemia, reflux esophagitis,  Coming to [place] bilateral leg weakness.  It originally started on the 7/5 and patient had an MRI of the brain with and without contrast.  Patient was recently discharged on 6 July.  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  Patient is no longer on steroids per discharge summary.  Per PT note and chart review patient needs A little help with walking and/or transfers;Help with stairs or ramp for entrance;Assist for transportation.  Patient today also complains of generalized weakness.  States that she lives with her brother who is her healthcare power of attorney and has been helping at home.  Patient is a limited historian otherwise she does not report any shortness of breath but is found to be hypoxic with O2 sats of 89% in no distress.  Initial vitals also show blood pressure of 107/57 heart rate of 99 respirations of 18.  CTA chest showed bilateral infiltrates no pulmonary embolism.  Exam is otherwise normal except that patient is extremely weak.  Procalcitonin is negative.  Normal white count.   Etiology of current presentation is unclear.  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  Also previous admission considering nutritional deficiencies resulting in weakness and decreased ability to ambulate.  I have engaged neurosurgery for recommendations.  Chest CT findings of unclear etiology.  Demonstrating bilateral infiltrates however no other clinical signs of infection.  Normal white count, no fevers.   Assessment & Plan:   Principal Problem:   Lower extremity weakness Active Problems:   Meningioma (HCC)   Bilateral leg weakness   Thrombocytopenia (HCC)    Essential hypertension, benign   OSA (obstructive sleep apnea)   Pre-diabetes   Gastroesophageal reflux disease without esophagitis   PNA (pneumonia)   Acute respiratory failure with hypoxia (HCC)  Bilateral lower extremity weakness Unclear etiology.  History of leg weakness progressive over 6 weeks.  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  MRI lumbar and thoracic spine with evidence of lucencies concerning for possible metastatic disease. Plan: PT evaluation Neurosurgical evaluation, recommendations appreciated  Meningioma Patient follows with [facility] neurology.  Receiving RT with Dr. Aggie Cosier.  Liver lesions Will get dedicated CT abdomen pelvis with IV contrast for better characterization  Thrombocytopenia Appears chronic and asymptomatic  Essential hypertension Blood pressure soft.  Home diltiazem and Benicar on hold  Obstructive sleep apnea Goes for CPAP  Acute respiratory failure with hypoxia Ruled out Patient on room air  Possible pneumonia Unclear etiology.  Findings suspicious for possible COVID Procalcitonin negative.  No fevers.  No elevated white count COVID swab negative Suspicion low for bacterial infection Plan: Stop all antibiotics Check strep pneumo, Legionella, Fungitell Check respiratory viral panel Consider pulmonary consultation  GERD PPI    DVT prophylaxis: SQ heparin Code Status: Full Family Communication:None today Disposition Plan: Status is: Observation The patient will require care spanning > 2 midnights and should be moved to inpatient because: Multiple acute issues as above.  Patient with inability to ambulate.  Ruling out infectious etiology.  Neurosurgical consultation pending.   Level of care: Telemetry Medical  Consultants:  Neurosurgery  Procedures:  None  Antimicrobials:   Subjective: Seen and examined.  Resting in bed.  No visible distress.  Denies pain, reports weakness  Objective: Vitals:    12/09/22 0100 12/09/22 0200 12/09/22 0340 12/09/22 0733  BP: (!) 104/58 (!) 110/58 107/61 (!) 99/45  Pulse: 66 70 70 67  Resp: (!) 21 (!) 23 20 17   Temp:   98.8 F (37.1 C) 98.9 F (37.2 C)  TempSrc:      SpO2: 97% 95% 94% 95%  Weight:      Height:        Intake/Output Summary (Last 24 hours) at 12/09/2022 1326 Last data filed at 12/09/2022 0446 Gross per 24 hour  Intake 434.33 ml  Output --  Net 434.33 ml   Filed Weights   12/08/22 1726  Weight: 82.6 kg    Examination:  General exam: Appears calm and comfortable  Respiratory system: Clear to auscultation. Respiratory effort normal. Cardiovascular system: S1-S2, RRR, no murmurs, no pedal edema Gastrointestinal system: Soft, NT/ND, normal bowel sounds Central nervous system: Alert and oriented. No focal neurological deficits. Extremities: Decreased power bilateral lower extremities.  Gait not assessed Skin: No rashes, lesions or ulcers Psychiatry: Judgement and insight appear normal. Mood & affect appropriate.     Data Reviewed: I have personally reviewed following labs and imaging studies  CBC: Recent Labs  Lab 12/04/22 0934 12/05/22 0615 12/08/22 1733 12/09/22 0213  WBC 7.4 5.0 5.7 5.4  HGB 14.2 12.7 11.8* 10.1*  HCT 42.2 37.2 34.8* 30.7*  MCV 87.4 86.9 87.4 89.5  PLT 111* 98* 136* 136*   Basic Metabolic Panel: Recent Labs  Lab 12/04/22 0934 12/05/22 0615 12/08/22 1733 12/09/22 0213  NA 135 135 137  --   K 3.6 3.2* 3.7  --   CL 104 103 103  --   CO2 20* 23 23  --   GLUCOSE 236* 189* 112*  --   BUN 17 10 12   --   CREATININE 0.57 0.51 0.63 0.65  CALCIUM 8.3* 8.0* 8.3*  --    GFR: Estimated Creatinine Clearance: 60.5 mL/min (by C-G formula based on SCr of 0.65 mg/dL). Liver Function Tests: Recent Labs  Lab 12/08/22 1733  AST 38  ALT 39  ALKPHOS 58  BILITOT 0.7  PROT 5.8*  ALBUMIN 2.5*   No results for input(s): "LIPASE", "AMYLASE" in the last 168 hours. No results for input(s):  "AMMONIA" in the last 168 hours. Coagulation Profile: Recent Labs  Lab 12/09/22 0653  INR 1.2   Cardiac Enzymes: Recent Labs  Lab 12/04/22 0934 12/08/22 1733  CKTOTAL 129 39   BNP (last 3 results) No results for input(s): "PROBNP" in the last 8760 hours. HbA1C: No results for input(s): "HGBA1C" in the last 72 hours. CBG: Recent Labs  Lab 12/05/22 0808 12/05/22 1233 12/05/22 1234 12/09/22 0859 12/09/22 1122  GLUCAP 173* 226* 257* 115* 122*   Lipid Profile: No results for input(s): "CHOL", "HDL", "LDLCALC", "TRIG", "CHOLHDL", "LDLDIRECT" in the last 72 hours. Thyroid Function Tests: Recent Labs    12/09/22 0213  TSH 0.743   Anemia Panel: No results for input(s): "VITAMINB12", "FOLATE", "FERRITIN", "TIBC", "IRON", "RETICCTPCT" in the last 72 hours. Sepsis Labs: Recent Labs  Lab 12/08/22 1733 12/09/22 0213 12/09/22 0653  PROCALCITON 0.20  --   --   LATICACIDVEN  --  0.9 1.4    Recent Results (from the past 240 hour(s))  SARS Coronavirus 2 by RT PCR (hospital order, performed in [facility] hospital lab) *cepheid single result test* Anterior Nasal Swab     Status: None   Collection Time: 12/09/22 12:18 AM  Specimen: Anterior Nasal Swab  Result Value Ref Range Status   SARS Coronavirus 2 by RT PCR NEGATIVE NEGATIVE Final    Comment: (NOTE) SARS-CoV-2 target nucleic acids are NOT DETECTED.  The SARS-CoV-2 RNA is generally detectable in upper and lower respiratory specimens during the acute phase of infection. The lowest concentration of SARS-CoV-2 viral copies this assay can detect is 250 copies / mL. A negative result does not preclude SARS-CoV-2 infection and should not be used as the sole basis for treatment or other patient management decisions.  A negative result may occur with improper specimen collection / handling, submission of specimen other than nasopharyngeal swab, presence of viral mutation(s) within the areas targeted by this assay, and  inadequate number of viral copies (<250 copies / mL). A negative result must be combined with clinical observations, patient history, and epidemiological information.  Fact Sheet for Patients:   RoadLapTop.co.za  Fact Sheet for Healthcare Providers: http://kim-miller.com/  This test is not yet approved or  cleared by the [place] FDA and has been authorized for detection and/or diagnosis of SARS-CoV-2 by FDA under an Emergency Use Authorization (EUA).  This EUA will remain in effect (meaning this test can be used) for the duration of the COVID-19 declaration under Section 564(b)(1) of the Act, 21 U.S.C. section 360bbb-3(b)(1), unless the authorization is terminated or revoked sooner.  Performed at [facility], [address] [place], [place] [address]          Radiology Studies: US Venous Img Lower Bilateral (DVT)  Result Date: 12/09/2022 CLINICAL DATA:  77 year old female with history of bilateral leg weakness. EXAM: BILATERAL LOWER EXTREMITY VENOUS DOPPLER ULTRASOUND TECHNIQUE: Gray-scale sonography with graded compression, as well as color Doppler and duplex ultrasound were performed to evaluate the lower extremity deep venous systems from the level of the common femoral vein and including the common femoral, femoral, profunda femoral, popliteal and calf veins including the posterior tibial, peroneal and gastrocnemius veins when visible. The superficial great saphenous vein was also interrogated. Spectral Doppler was utilized to evaluate flow at rest and with distal augmentation maneuvers in the common femoral, femoral and popliteal veins. COMPARISON:  None Available. FINDINGS: RIGHT LOWER EXTREMITY Common Femoral Vein: No evidence of thrombus. Normal compressibility, respiratory phasicity and response to augmentation. Saphenofemoral Junction: No evidence of thrombus. Normal compressibility and flow on color Doppler  imaging. Profunda Femoral Vein: No evidence of thrombus. Normal compressibility and flow on color Doppler imaging. Femoral Vein: No evidence of thrombus. Normal compressibility, respiratory phasicity and response to augmentation. Popliteal Vein: No evidence of thrombus. Normal compressibility, respiratory phasicity and response to augmentation. Calf Veins: No evidence of thrombus. Normal compressibility and flow on color Doppler imaging. Superficial Great Saphenous Vein: No evidence of thrombus. Normal compressibility. Venous Reflux:  None. Other Findings: In the popliteal fossa there is a 1.5 x 0.6 x 1.9 cm heterogeneously hypoechoic lesion with increased through transmission, ovoid in shape, without internal blood flow, presumably a Baker's cyst. LEFT LOWER EXTREMITY Common Femoral Vein: No evidence of thrombus. Normal compressibility, respiratory phasicity and response to augmentation. Saphenofemoral Junction: No evidence of thrombus. Normal compressibility and flow on color Doppler imaging. Profunda Femoral Vein: No evidence of thrombus. Normal compressibility and flow on color Doppler imaging. Femoral Vein: No evidence of thrombus. Normal compressibility, respiratory phasicity and response to augmentation. Popliteal Vein: No evidence of thrombus. Normal compressibility, respiratory phasicity and response to augmentation. Calf Veins: No evidence of thrombus. Normal compressibility and flow on color Doppler imaging.  Superficial Great Saphenous Vein: No evidence of thrombus. Normal compressibility. Venous Reflux:  None. Other Findings:  None. IMPRESSION: 1. No evidence of deep venous thrombosis in either lower extremity. 2. Small right-sided Baker's cyst incidentally noted, as above. Electronically Signed   By: Trudie Reed M.D.   On: 12/09/2022 05:37   MR THORACIC SPINE W WO CONTRAST  Result Date: 12/09/2022 CLINICAL DATA:  Ataxia, concern for metastatic disease EXAM: MRI THORACIC AND LUMBAR SPINE WITHOUT  AND WITH CONTRAST TECHNIQUE: Multiplanar and multiecho pulse sequences of the thoracic and lumbar spine were obtained without and with intravenous contrast. CONTRAST:  8mL GADAVIST GADOBUTROL 1 MMOL/ML IV SOLN COMPARISON:  No prior MRI of the thoracic or lumbar spine available. FINDINGS: MRI THORACIC SPINE FINDINGS Alignment: Preservation of the normal thoracic kyphosis. No listhesis. Mild S-shaped curvature of the thoracolumbar spine. Vertebrae: No acute fracture or evidence of discitis. T2 hyperintense, contrast-enhancing lesions in the right posterior elements at T6 (series 13, image 5 and series 8, image 5), and T10 (series 13, image 6 and series 8, image 6) could represent metastatic lesions in the T5 right pedicle and transverse process and T10 right facet. A T1 isointense, mildly T2 hyperintense, and minimally enhancing lesion in the T10 vertebral body is nonspecific and could represent a suspicious lesion or an atypical hemangioma. Cord:  Normal signal and morphology.  No abnormal enhancement. Paraspinal and other soft tissues: Trace bilateral pleural effusions. Airspace opacities are better visualized on 12/08/2022 CTA chest. Disc levels: Mild degenerative changes without significant spinal canal stenosis or neural foraminal narrowing. MRI LUMBAR SPINE FINDINGS Segmentation:  5 lumbar type vertebral bodies. Alignment: No significant listhesis. Preservation of the normal thoracic lordosis. Mild levocurvature. Vertebrae: No acute fracture or evidence of discitis. 5 mm focus of increased T2 signal and enhancement in the L1 vertebral body (series 6, image 11), which is nonspecific but could represent a metastatic lesion Conus medullaris: Extends to the L1-L2 level and appears normal. No abnormal enhancement. Paraspinal and other soft tissues: Atrophy of the inferior paraspinous musculature. No lymphadenopathy. Disc levels: T12-L1: No significant disc bulge. No spinal canal stenosis or neural foraminal  narrowing. L1-L2: No significant disc bulge. No spinal canal stenosis or neural foraminal narrowing. L2-L3: No significant disc bulge. No spinal canal stenosis or neural foraminal narrowing. L3-L4: Left foraminal and extreme lateral disc protrusion. No spinal canal stenosis or neural foraminal narrowing. L4-L5: Mild disc bulge. Mild facet arthropathy. Narrowing of the lateral recesses. No spinal canal stenosis. Mild bilateral neural foraminal narrowing. L5-S1: Minimal disc bulge with left subarticular and foraminal protrusion. No spinal canal stenosis. Mild left neural foraminal narrowing. IMPRESSION: 1. Small enhancing lesions in the right posterior elements at T6 and T10, as well as possibly at L1, which could represent metastatic disease. A lesion in the T10 vertebral body has a slightly different signal characteristics and could represent a suspicious lesion or an atypical hemangioma. 2. No spinal canal stenosis or neural foraminal narrowing in the thoracic spine. 3. L4-L5 mild bilateral neural foraminal narrowing. Narrowing of the lateral recesses at this level could affect the descending L5 nerve roots. 4. L5-S1 mild left neural foraminal narrowing. Electronically Signed   By: Wiliam Ke M.D.   On: 12/09/2022 03:41   MR Lumbar Spine W Wo Contrast  Result Date: 12/09/2022 CLINICAL DATA:  Ataxia, concern for metastatic disease EXAM: MRI THORACIC AND LUMBAR SPINE WITHOUT AND WITH CONTRAST TECHNIQUE: Multiplanar and multiecho pulse sequences of the thoracic and lumbar spine were obtained without  and with intravenous contrast. CONTRAST:  8mL GADAVIST GADOBUTROL 1 MMOL/ML IV SOLN COMPARISON:  No prior MRI of the thoracic or lumbar spine available. FINDINGS: MRI THORACIC SPINE FINDINGS Alignment: Preservation of the normal thoracic kyphosis. No listhesis. Mild S-shaped curvature of the thoracolumbar spine. Vertebrae: No acute fracture or evidence of discitis. T2 hyperintense, contrast-enhancing lesions in the  right posterior elements at T6 (series 13, image 5 and series 8, image 5), and T10 (series 13, image 6 and series 8, image 6) could represent metastatic lesions in the T5 right pedicle and transverse process and T10 right facet. A T1 isointense, mildly T2 hyperintense, and minimally enhancing lesion in the T10 vertebral body is nonspecific and could represent a suspicious lesion or an atypical hemangioma. Cord:  Normal signal and morphology.  No abnormal enhancement. Paraspinal and other soft tissues: Trace bilateral pleural effusions. Airspace opacities are better visualized on 12/08/2022 CTA chest. Disc levels: Mild degenerative changes without significant spinal canal stenosis or neural foraminal narrowing. MRI LUMBAR SPINE FINDINGS Segmentation:  5 lumbar type vertebral bodies. Alignment: No significant listhesis. Preservation of the normal thoracic lordosis. Mild levocurvature. Vertebrae: No acute fracture or evidence of discitis. 5 mm focus of increased T2 signal and enhancement in the L1 vertebral body (series 6, image 11), which is nonspecific but could represent a metastatic lesion Conus medullaris: Extends to the L1-L2 level and appears normal. No abnormal enhancement. Paraspinal and other soft tissues: Atrophy of the inferior paraspinous musculature. No lymphadenopathy. Disc levels: T12-L1: No significant disc bulge. No spinal canal stenosis or neural foraminal narrowing. L1-L2: No significant disc bulge. No spinal canal stenosis or neural foraminal narrowing. L2-L3: No significant disc bulge. No spinal canal stenosis or neural foraminal narrowing. L3-L4: Left foraminal and extreme lateral disc protrusion. No spinal canal stenosis or neural foraminal narrowing. L4-L5: Mild disc bulge. Mild facet arthropathy. Narrowing of the lateral recesses. No spinal canal stenosis. Mild bilateral neural foraminal narrowing. L5-S1: Minimal disc bulge with left subarticular and foraminal protrusion. No spinal canal  stenosis. Mild left neural foraminal narrowing. IMPRESSION: 1. Small enhancing lesions in the right posterior elements at T6 and T10, as well as possibly at L1, which could represent metastatic disease. A lesion in the T10 vertebral body has a slightly different signal characteristics and could represent a suspicious lesion or an atypical hemangioma. 2. No spinal canal stenosis or neural foraminal narrowing in the thoracic spine. 3. L4-L5 mild bilateral neural foraminal narrowing. Narrowing of the lateral recesses at this level could affect the descending L5 nerve roots. 4. L5-S1 mild left neural foraminal narrowing. Electronically Signed   By: Wiliam Ke M.D.   On: 12/09/2022 03:41   CT Angio Chest PE W and/or Wo Contrast  Result Date: 12/08/2022 CLINICAL DATA:  High probability for PE EXAM: CT ANGIOGRAPHY CHEST WITH CONTRAST TECHNIQUE: Multidetector CT imaging of the chest was performed using the standard protocol during bolus administration of intravenous contrast. Multiplanar CT image reconstructions and MIPs were obtained to evaluate the vascular anatomy. RADIATION DOSE REDUCTION: This exam was performed according to the departmental dose-optimization program which includes automated exposure control, adjustment of the mA and/or kV according to patient size and/or use of iterative reconstruction technique. CONTRAST:  75mL OMNIPAQUE IOHEXOL 350 MG/ML SOLN COMPARISON:  None Available. FINDINGS: Cardiovascular: The heart is enlarged. There is no pericardial effusion. The aorta is normal in size. There are atherosclerotic calcifications of the aorta. There is adequate opacification of the pulmonary arteries to the segmental level. There is  no evidence for pulmonary embolism. Mediastinum/Nodes: There are diffuse nonenlarged mediastinal and hilar lymph nodes. Visualized esophagus and thyroid gland are within normal limits. Lungs/Pleura: There are multifocal interstitial, airspace and ground-glass patchy  opacities predominantly in the bilateral upper lobes but also minimally in the left lower lobe. There are trace bilateral pleural effusions. There is no pneumothorax. Trachea and central airways are patent. Upper Abdomen: Heterogeneous hypodensity throughout the right lobe of the liver is incompletely characterized on this study. Musculoskeletal: No chest wall abnormality. No acute or significant osseous findings. Review of the MIP images confirms the above findings. IMPRESSION: 1. No evidence for pulmonary embolism. 2. Multifocal interstitial, airspace and ground-glass opacities predominantly in the bilateral upper lobes but also minimally in the left lower lobe. Findings are concerning for multifocal pneumonia. COVID pneumonia can have this appearance. 3. Trace bilateral pleural effusions. 4. Cardiomegaly. 5. Heterogeneous hypodensity throughout the right lobe of the liver is incompletely characterized on this study. Differential diagnosis includes metastatic disease or infection. Recommend further evaluation with dedicated CT or MRI of the abdomen with contrast. Aortic Atherosclerosis (ICD10-I70.0). Electronically Signed   By: Darliss Cheney M.D.   On: 12/08/2022 23:15   CT HEAD WO CONTRAST ( )  Result Date: 12/08/2022 CLINICAL DATA:  Mental status change, unknown cause EXAM: CT HEAD WITHOUT CONTRAST TECHNIQUE: Contiguous axial images were obtained from the base of the skull through the vertex without intravenous contrast. RADIATION DOSE REDUCTION: This exam was performed according to the departmental dose-optimization program which includes automated exposure control, adjustment of the mA and/or kV according to patient size and/or use of iterative reconstruction technique. COMPARISON:  05/11/2020, MRI 12/04/2022 FINDINGS: Brain: 2.2 cm extra-axial low-density meningioma again seen along the right side of the falx, unchanged in size since recent MRI. Overlying adjacent brain edema, stable. No acute infarct,  hemorrhage or hydrocephalus. Vascular: No hyperdense vessel or unexpected calcification. Skull: No acute calvarial abnormality. Sinuses/Orbits: No acute findings Other: None IMPRESSION: Stable meningioma along the high right falx with mild adjacent brain edema, unchanged since recent study. No acute intracranial abnormality. Electronically Signed   By: Charlett Nose M.D.   On: 12/08/2022 22:36   DG Chest 2 View  Result Date: 12/08/2022 CLINICAL DATA:  Weakness. EXAM: CHEST - 2 VIEW COMPARISON:  Chest radiograph dated 05/11/2020. FINDINGS: Bilateral streaky interstitial densities, left greater than right, most consistent with developing infiltrate. No pleural effusion or pneumothorax. The cardiac silhouette is within normal limits. Atherosclerotic calcification of the aortic arch. No acute osseous pathology. IMPRESSION: Developing bilateral infiltrates. Electronically Signed   By: Elgie Collard M.D.   On: 12/08/2022 18:08        Scheduled Meds:  aspirin EC  81 mg Oral Daily   cholecalciferol  1,000 Units Oral Daily   heparin  5,000 Units Subcutaneous Q8H   insulin aspart  0-15 Units Subcutaneous TID WC   Continuous Infusions:  metronidazole Stopped (12/09/22 0408)     LOS: 0 days      Tresa Moore, MD [facility]   If 7PM-7AM, please contact night-coverage  12/09/2022, 1:26 PM

## 2022-12-09 NOTE — Plan of Care (Signed)

## 2022-12-09 NOTE — Evaluation (Signed)
Occupational Therapy Evaluation Patient Details Name: Vickie Stewart MRN: [id number] DOB: [birth year] Today's Date: 12/09/2022   History of Present Illness Pt is a 77 y/o F admitted on 12/08/22 after presenting with c/o BLE weakness. MRI showed "small enhancing lesions in the right posterior elements at T6 & T10, as well as possibly at L1." PMH: meningioma of the right parafalcine region s/p IMRT, HTN, HLD, anxiety, glaucoma, obesity, vaginal CA, thoracic or lumbosacral neuritis or radiculitis   Clinical Impression   Pt was seen for OT evaluation this date. Prior to hospital admission, pt was living alone, with her family providing assistance for ADLs as needed. Pt has experienced continued decline in mobility status and activity tolerance. Pt presents to acute OT demonstrating impaired ADL performance and functional mobility 2/2 generalized weakness, ROM, and anxiety over fear of falling. (See OT problem list for additional functional deficits). Pt currently requires MOD A for bed mobility to manage BLEs. MIN A sit<>stand with fair balance. Pt expressed fear of falling evidenced by grabbing at therapist and reaching for bed rail. Pt reported feeling unsteady. MIN A for LE dressing sitting EOB 2/2 limited ROM. Pt would benefit from skilled OT services to address noted impairments and functional limitations (see below for any additional details) in order to maximize safety and independence while minimizing falls risk and caregiver burden. Anticipate the need for follow up OT services upon acute hospital DC.       Recommendations for follow up therapy are one component of a multi-disciplinary discharge planning process, led by the attending physician.  Recommendations may be updated based on patient status, additional functional criteria and insurance authorization.   Assistance Recommended at Discharge Intermittent Supervision/Assistance  Patient can return home with the following A little help  with walking and/or transfers;A little help with bathing/dressing/bathroom;Assistance with cooking/housework;Assist for transportation;Help with stairs or ramp for entrance    Functional Status Assessment  Patient has had a recent decline in their functional status and demonstrates the ability to make significant improvements in function in a reasonable and predictable amount of time.  Equipment Recommendations  Other (comment) (defer to next venue)    Recommendations for Other Services       Precautions / Restrictions Precautions Precautions: Fall Restrictions Weight Bearing Restrictions: No      Mobility Bed Mobility Overal bed mobility: Needs Assistance Bed Mobility: Supine to Sit, Sit to Supine     Supine to sit: Min assist Sit to supine: Mod assist   General bed mobility comments: BLE management per request of pt    Transfers Overall transfer level: Needs assistance Equipment used: Rolling walker (2 wheels) Transfers: Sit to/from Stand Sit to Stand: Min assist     Step pivot transfers: Min assist     General transfer comment: Pt presented with fear of falling. Able to side step toward [facility]      Balance Overall balance assessment: Needs assistance Sitting-balance support: No upper extremity supported, Feet supported Sitting balance-Leahy Scale: Good Sitting balance - Comments: Good static balance, limited ROM during functional mobility task   Standing balance support: Bilateral upper extremity supported, During functional activity, Reliant on assistive device for balance Standing balance-Leahy Scale: Fair Standing balance comment: Static balance was fair, but pt insisted on holding onto bed or therapist.                           ADL either performed or assessed with clinical judgement   ADL  Overall ADL's : Needs assistance/impaired                     Lower Body Dressing: Minimal assistance;Sit to/from stand Lower Body Dressing  Details (indicate cue type and reason): Pt able to reach shins, but assistance needed to thread clothing over feet.             Functional mobility during ADLs: Minimal assistance;Cueing for safety       Vision Baseline Vision/History: 1 Wears glasses       Perception     Praxis      Pertinent Vitals/Pain Pain Assessment Pain Assessment: No/denies pain     Hand Dominance Right   Extremity/Trunk Assessment Upper Extremity Assessment Upper Extremity Assessment: Overall WFL for tasks assessed   Lower Extremity Assessment Lower Extremity Assessment: Overall WFL for tasks assessed   Cervical / Trunk Assessment Cervical / Trunk Assessment: Normal   Communication Communication Communication: No difficulties   Cognition Arousal/Alertness: Awake/alert Behavior During Therapy: Anxious Overall Cognitive Status: Within Functional Limits for tasks assessed                                 General Comments: Presented with fear of falling and increased dependency from prior admission     General Comments       Exercises     Shoulder Instructions      Home Living Family/patient expects to be discharged to:: Private residence Living Arrangements: Alone Available Help at Discharge: Family;Available PRN/intermittently Type of Home: House Home Access: Stairs to enter [employer] of Steps: 4 Entrance Stairs-Rails: Right;Left Home Layout: Multi-level Alternate Level Stairs-Number of Steps: 2   Bathroom Shower/Tub: Producer, television/film/video: Standard Bathroom Accessibility: Yes How Accessible: Accessible via walker Home Equipment: Shower seat;Grab bars - toilet;Grab bars - tub/shower          Prior Functioning/Environment Prior Level of Function : Independent/Modified Independent;Driving             Mobility Comments: Prior to last admission pt was independent without AD, driving, but has had steady decline          OT  Problem List: Decreased strength;Decreased range of motion;Decreased activity tolerance;Impaired balance (sitting and/or standing)      OT Treatment/Interventions: Self-care/ADL training;Therapeutic exercise;Therapeutic activities;DME and/or AE instruction;Patient/family education    OT Goals(Current goals can be found in the care plan section) Acute Rehab OT Goals Patient Stated Goal: To go to rehab OT Goal Formulation: With patient Time For Goal Achievement: 12/23/22 Potential to Achieve Goals: Good ADL Goals Pt Will Perform Grooming: Independently;sitting Pt Will Perform Lower Body Dressing: sit to/from stand;with modified independence Pt Will Transfer to Toilet: with modified independence;ambulating;regular height toilet  OT Frequency: Min 1X/week    Co-evaluation              AM-PAC OT "6 Clicks" Daily Activity     Outcome Measure Help from another person eating meals?: None Help from another person taking care of personal grooming?: A Little Help from another person toileting, which includes using toliet, bedpan, or urinal?: A Little Help from another person bathing (including washing, rinsing, drying)?: A Little Help from another person to put on and taking off regular upper body clothing?: None Help from another person to put on and taking off regular lower body clothing?: A Little 6 Click Score: 20   End of Session Equipment Utilized During  Treatment: Rolling walker (2 wheels)  Activity Tolerance: Patient tolerated treatment well Patient left: in bed;with call bell/phone within reach;with bed alarm set;with family/visitor present  OT Visit Diagnosis: Other abnormalities of gait and mobility (R26.89)                Time: 1914-7829 OT Time Calculation (min): 15 min Charges:  OT General Charges $OT Visit: 1 Visit OT Evaluation $OT Eval Low Complexity: 1 Low [employer], OTS

## 2022-12-09 NOTE — Assessment & Plan Note (Signed)
IV PPI.  Aspiration precautions.

## 2022-12-09 NOTE — Assessment & Plan Note (Signed)
Pt has h/o leg weakness for about 6 weeks now progressing to immobility now.  Pt is limited historian therefore we will obtain stat MRI or thoracic  and lumbar.  PT consult.

## 2022-12-09 NOTE — Assessment & Plan Note (Signed)
Found A1c of 9 on hart review I am not sure if pt know will defer to am team to d/w pt about her dm II. Which may be from her prednisone or decadron. Pt is also overweight and may have insulin resistant. Glycemic protocol.

## 2022-12-09 NOTE — Assessment & Plan Note (Signed)
Exam and presentation concerning for spinal mets we will do stat MRI spine  and evaluate.  CPK normal we will also do venous dopplers. Pulses WNL.

## 2022-12-09 NOTE — Assessment & Plan Note (Signed)
    Latest Ref Rng & Units 12/08/2022    5:33 PM 12/05/2022    6:15 AM 12/04/2022    9:34 AM  CBC  WBC 4.0 - 10.5 K/uL 5.7  5.0  7.4   Hemoglobin 12.0 - 15.0 g/dL 32.9  51.8  84.1   Hematocrit 36.0 - 46.0 % 34.8  37.2  42.2   Platelets 150 - 400 K/uL 136  98  111   Chronic ,we will follow.

## 2022-12-09 NOTE — Plan of Care (Signed)
  Problem: Fluid Volume: Goal: Hemodynamic stability will improve Outcome: Progressing   Problem: Clinical Measurements: Goal: Diagnostic test results will improve Outcome: Progressing Goal: Signs and symptoms of infection will decrease Outcome: Progressing   Problem: Respiratory: Goal: Ability to maintain adequate ventilation will improve Outcome: Progressing   Problem: Education: Goal: Ability to describe self-care measures that may prevent or decrease complications (Diabetes Survival Skills Education) will improve Outcome: Progressing Goal: Individualized Educational Video(s) Outcome: Progressing   Problem: Coping: Goal: Ability to adjust to condition or change in health will improve Outcome: Progressing   Problem: Fluid Volume: Goal: Ability to maintain a balanced intake and output will improve Outcome: Progressing   Problem: Health Behavior/Discharge Planning: Goal: Ability to identify and utilize available resources and services will improve Outcome: Progressing Goal: Ability to manage health-related needs will improve Outcome: Progressing   Problem: Metabolic: Goal: Ability to maintain appropriate glucose levels will improve Outcome: Progressing   Problem: Nutritional: Goal: Maintenance of adequate nutrition will improve Outcome: Progressing Goal: Progress toward achieving an optimal weight will improve Outcome: Progressing   Problem: Skin Integrity: Goal: Risk for impaired skin integrity will decrease Outcome: Progressing   Problem: Tissue Perfusion: Goal: Adequacy of tissue perfusion will improve Outcome: Progressing   Problem: Education: Goal: Knowledge of General Education information will improve Description: Including pain rating scale, medication(s)/side effects and non-pharmacologic comfort measures Outcome: Progressing   Problem: Health Behavior/Discharge Planning: Goal: Ability to manage health-related needs will improve Outcome:  Progressing   Problem: Clinical Measurements: Goal: Ability to maintain clinical measurements within normal limits will improve Outcome: Progressing Goal: Will remain free from infection Outcome: Progressing Goal: Diagnostic test results will improve Outcome: Progressing Goal: Respiratory complications will improve Outcome: Progressing Goal: Cardiovascular complication will be avoided Outcome: Progressing   Problem: Activity: Goal: Risk for activity intolerance will decrease Outcome: Progressing   Problem: Coping: Goal: Level of anxiety will decrease Outcome: Progressing   Problem: Nutrition: Goal: Adequate nutrition will be maintained Outcome: Progressing   Problem: Elimination: Goal: Will not experience complications related to bowel motility Outcome: Progressing Goal: Will not experience complications related to urinary retention Outcome: Progressing   Problem: Pain Managment: Goal: General experience of comfort will improve Outcome: Progressing   Problem: Safety: Goal: Ability to remain free from injury will improve Outcome: Progressing   Problem: Skin Integrity: Goal: Risk for impaired skin integrity will decrease Outcome: Progressing

## 2022-12-10 DIAGNOSIS — Z923 Personal history of irradiation: Secondary | ICD-10-CM | POA: Diagnosis not present

## 2022-12-10 DIAGNOSIS — D513 Other dietary vitamin B12 deficiency anemia: Secondary | ICD-10-CM | POA: Diagnosis present

## 2022-12-10 DIAGNOSIS — D480 Neoplasm of uncertain behavior of bone and articular cartilage: Secondary | ICD-10-CM | POA: Diagnosis present

## 2022-12-10 DIAGNOSIS — E7800 Pure hypercholesterolemia, unspecified: Secondary | ICD-10-CM | POA: Diagnosis present

## 2022-12-10 DIAGNOSIS — R531 Weakness: Secondary | ICD-10-CM | POA: Diagnosis present

## 2022-12-10 DIAGNOSIS — G4733 Obstructive sleep apnea (adult) (pediatric): Secondary | ICD-10-CM | POA: Diagnosis present

## 2022-12-10 DIAGNOSIS — D329 Benign neoplasm of meninges, unspecified: Secondary | ICD-10-CM | POA: Diagnosis present

## 2022-12-10 DIAGNOSIS — I10 Essential (primary) hypertension: Secondary | ICD-10-CM | POA: Diagnosis present

## 2022-12-10 DIAGNOSIS — K219 Gastro-esophageal reflux disease without esophagitis: Secondary | ICD-10-CM | POA: Diagnosis present

## 2022-12-10 DIAGNOSIS — Z6833 Body mass index (BMI) 33.0-33.9, adult: Secondary | ICD-10-CM | POA: Diagnosis not present

## 2022-12-10 DIAGNOSIS — F411 Generalized anxiety disorder: Secondary | ICD-10-CM | POA: Diagnosis present

## 2022-12-10 DIAGNOSIS — Z1152 Encounter for screening for COVID-19: Secondary | ICD-10-CM | POA: Diagnosis not present

## 2022-12-10 DIAGNOSIS — Z79899 Other long term (current) drug therapy: Secondary | ICD-10-CM | POA: Diagnosis not present

## 2022-12-10 DIAGNOSIS — R29898 Other symptoms and signs involving the musculoskeletal system: Secondary | ICD-10-CM | POA: Diagnosis not present

## 2022-12-10 DIAGNOSIS — Z8249 Family history of ischemic heart disease and other diseases of the circulatory system: Secondary | ICD-10-CM | POA: Diagnosis not present

## 2022-12-10 DIAGNOSIS — R7303 Prediabetes: Secondary | ICD-10-CM | POA: Diagnosis present

## 2022-12-10 DIAGNOSIS — Z888 Allergy status to other drugs, medicaments and biological substances status: Secondary | ICD-10-CM | POA: Diagnosis not present

## 2022-12-10 DIAGNOSIS — Z7982 Long term (current) use of aspirin: Secondary | ICD-10-CM | POA: Diagnosis not present

## 2022-12-10 DIAGNOSIS — Z87891 Personal history of nicotine dependence: Secondary | ICD-10-CM | POA: Diagnosis not present

## 2022-12-10 DIAGNOSIS — E669 Obesity, unspecified: Secondary | ICD-10-CM | POA: Diagnosis present

## 2022-12-10 DIAGNOSIS — K2100 Gastro-esophageal reflux disease with esophagitis, without bleeding: Secondary | ICD-10-CM | POA: Diagnosis present

## 2022-12-10 DIAGNOSIS — R262 Difficulty in walking, not elsewhere classified: Secondary | ICD-10-CM | POA: Diagnosis present

## 2022-12-10 DIAGNOSIS — Z8544 Personal history of malignant neoplasm of other female genital organs: Secondary | ICD-10-CM | POA: Diagnosis not present

## 2022-12-10 DIAGNOSIS — D696 Thrombocytopenia, unspecified: Secondary | ICD-10-CM | POA: Diagnosis present

## 2022-12-10 DIAGNOSIS — D509 Iron deficiency anemia, unspecified: Secondary | ICD-10-CM | POA: Diagnosis present

## 2022-12-10 LAB — GLUCOSE, CAPILLARY
Glucose-Capillary: 174 mg/dL — ABNORMAL HIGH (ref 70–99)
Glucose-Capillary: 78 mg/dL (ref 70–99)
Glucose-Capillary: 130 mg/dL — ABNORMAL HIGH (ref 70–99)
Glucose-Capillary: 106 mg/dL — ABNORMAL HIGH (ref 70–99)

## 2022-12-10 LAB — CREATININE, SERUM
Creatinine, Ser: 0.52 mg/dL (ref 0.44–1.00)
GFR, Estimated: >60 mL/min (ref >60)

## 2022-12-10 LAB — CALCITRIOL (1,25 DI-OH VIT D): Vit D, 1,25-Dihydroxy: 44.6 pg/mL (ref 24.8–81.5)

## 2022-12-10 MED ORDER — PHENOL 1.4 % MT LIQD
1.0000 | OROMUCOSAL | Status: DC | PRN
  Filled 2022-12-10: qty 177

## 2022-12-10 NOTE — Plan of Care (Signed)
  Problem: Respiratory: Goal: Ability to maintain adequate ventilation will improve Outcome: Progressing   Problem: Skin Integrity: Goal: Risk for impaired skin integrity will decrease Outcome: Progressing   Problem: Activity: Goal: Risk for activity intolerance will decrease Outcome: Progressing

## 2022-12-10 NOTE — Progress Notes (Signed)
PROGRESS NOTE    Vickie Stewart  WUJ:[id number] DOB: 10/20/1945 DOA: 12/08/2022 PCP: Alba Cory, MD    Brief Narrative:  77 y.o. female with medical history significant for meningioma of the right parafalcine region s/p IMRT, hypertension, hyperlipidemia, reflux esophagitis,  Coming to [place] bilateral leg weakness.  It originally started on the 7/5 and patient had an MRI of the brain with and without contrast.  Patient was recently discharged on 6 July.  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  Patient is no longer on steroids per discharge summary.  Per PT note and chart review patient needs A little help with walking and/or transfers;Help with stairs or ramp for entrance;Assist for transportation.  Patient today also complains of generalized weakness.  States that she lives with her brother who is her healthcare power of attorney and has been helping at home.  Patient is a limited historian otherwise she does not report any shortness of breath but is found to be hypoxic with O2 sats of 89% in no distress.  Initial vitals also show blood pressure of 107/57 heart rate of 99 respirations of 18.  CTA chest showed bilateral infiltrates no pulmonary embolism.  Exam is otherwise normal except that patient is extremely weak.  Procalcitonin is negative.  Normal white count.   Etiology of current presentation is unclear.  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  Also previous admission considering nutritional deficiencies resulting in weakness and decreased ability to ambulate.  I have engaged neurosurgery for recommendations.  Chest CT findings of unclear etiology.  Demonstrating bilateral infiltrates however no other clinical signs of infection.  Normal white count, no fevers.   Assessment & Plan:   Principal Problem:   Lower extremity weakness Active Problems:   Meningioma (HCC)   Bilateral leg weakness   Thrombocytopenia (HCC)    Essential hypertension, benign   OSA (obstructive sleep apnea)   Pre-diabetes   Gastroesophageal reflux disease without esophagitis   PNA (pneumonia)   Acute respiratory failure with hypoxia (HCC)   Inability to ambulate due to multiple joints  Bilateral lower extremity weakness Unclear etiology.  History of leg weakness progressive over 6 weeks.  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  MRI lumbar and thoracic spine with evidence of lucencies concerning for possible metastatic disease. Seen in consultation by neurosurgery.  No neurosurgical intervention warranted.  Seen on previous admission by neurology.  No neurologic etiology.  Suspect nutritional etiology.  Patient eating less than 25% of her meals. Plan: Continue out of bed exercises.  PT OT as tolerated.  Ambulate frequently.  Optimize nutritional status.  Will need skilled nursing facility.  TOC engaged  Meningioma Lumbar spinal lucency Patient follows with [facility] neurology.  Receiving RT with Dr. Aggie Cosier. Inpatient oncology consulted.  Plan for outpatient PET scan for better characterization.  Discussed with IR.  Lesions not amenable to image guided biopsy.  Liver lesions, ruled out CT abdomen pelvis did not demonstrate any liver lesions  Thrombocytopenia Appears chronic and asymptomatic  Essential hypertension Blood pressure soft.  Home diltiazem and Benicar on hold  Obstructive sleep apnea Goes for CPAP  Acute respiratory failure with hypoxia Ruled out Patient on room air  Possible pneumonia Unclear etiology.  Findings suspicious for possible COVID Procalcitonin negative.  No fevers.  No elevated white count COVID swab negative Suspicion low for bacterial infection RVP negative Plan: Monitor off antibiotics Check strep pneumo, Legionella, Fungitell: All pending Check respiratory viral  panel Consider pulmonary consultation  GERD PPI    DVT prophylaxis: SQ heparin Code Status:  Full Family Communication:None today Disposition Plan: Status is: Inpatient Remains inpatient appropriate because: Unsafe discharge plan.  Will need skilled nursing facility.     Level of care: Med-Surg  Consultants:  Neurosurgery  Procedures:  None  Antimicrobials:   Subjective: Seen and examined.  Resting in bed.  No visible distress.  Reports weakness of bilateral extremities.  Improving. Objective: Vitals:   12/09/22 0733 12/09/22 1526 12/09/22 2143 12/10/22 0733  BP: (!) 99/45 101/77 (!) 102/51 (!) 97/54  Pulse: 67 78 80 84  Resp: 17 17 20 18   Temp: 98.9 F (37.2 C) 98.6 F (37 C) 98.7 F (37.1 C) 98 F (36.7 C)  TempSrc:      SpO2: 95% 97% 99% 98%  Weight:      Height:        Intake/Output Summary (Last 24 hours) at 12/10/2022 1407 Last data filed at 12/10/2022 0130 Gross per 24 hour  Intake 74.17 ml  Output 550 ml  Net -475.83 ml   Filed Weights   12/08/22 1726  Weight: 82.6 kg    Examination:  General exam: NAD Respiratory system: Scattered crackles.  Normal work of breathing.  Room air Cardiovascular system: S1-S2, RRR, no murmurs, no pedal edema Gastrointestinal system: Soft, NT/ND, normal bowel sounds Central nervous system: Alert and oriented. No focal neurological deficits. Extremities: Decreased power bilateral lower extremities.  Gait not assessed Skin: No rashes, lesions or ulcers Psychiatry: Judgement and insight appear normal. Mood & affect appropriate.     Data Reviewed: I have personally reviewed following labs and imaging studies  CBC: Recent Labs  Lab 12/04/22 0934 12/05/22 0615 12/08/22 1733 12/09/22 0213  WBC 7.4 5.0 5.7 5.4  HGB 14.2 12.7 11.8* 10.1*  HCT 42.2 37.2 34.8* 30.7*  MCV 87.4 86.9 87.4 89.5  PLT 111* 98* 136* 136*   Basic Metabolic Panel: Recent Labs  Lab 12/04/22 0934 12/05/22 0615 12/08/22 1733 12/09/22 0213 12/10/22 0517  NA 135 135 137  --   --   K 3.6 3.2* 3.7  --   --   CL 104 103 103  --    --   CO2 20* 23 23  --   --   GLUCOSE 236* 189* 112*  --   --   BUN 17 10 12   --   --   CREATININE 0.57 0.51 0.63 0.65 0.52  CALCIUM 8.3* 8.0* 8.3*  --   --    GFR: Estimated Creatinine Clearance: 60.5 mL/min (by C-G formula based on SCr of 0.52 mg/dL). Liver Function Tests: Recent Labs  Lab 12/08/22 1733  AST 38  ALT 39  ALKPHOS 58  BILITOT 0.7  PROT 5.8*  ALBUMIN 2.5*   No results for input(s): "LIPASE", "AMYLASE" in the last 168 hours. No results for input(s): "AMMONIA" in the last 168 hours. Coagulation Profile: Recent Labs  Lab 12/09/22 0653  INR 1.2   Cardiac Enzymes: Recent Labs  Lab 12/04/22 0934 12/08/22 1733  CKTOTAL 129 39   BNP (last 3 results) No results for input(s): "PROBNP" in the last 8760 hours. HbA1C: No results for input(s): "HGBA1C" in the last 72 hours. CBG: Recent Labs  Lab 12/09/22 1122 12/09/22 1658 12/09/22 2154 12/10/22 0733 12/10/22 1143  GLUCAP 122* 120* 119* 174* 78   Lipid Profile: No results for input(s): "CHOL", "HDL", "LDLCALC", "TRIG", "CHOLHDL", "LDLDIRECT" in the last 72 hours. Thyroid Function Tests: Recent  Labs    12/09/22 0213  TSH 0.743   Anemia Panel: No results for input(s): "VITAMINB12", "FOLATE", "FERRITIN", "TIBC", "IRON", "RETICCTPCT" in the last 72 hours. Sepsis Labs: Recent Labs  Lab 12/08/22 1733 12/09/22 0213 12/09/22 0653  PROCALCITON 0.20  --   --   LATICACIDVEN  --  0.9 1.4    Recent Results (from the past 240 hour(s))  Blood culture (single)     Status: None (Preliminary result)   Collection Time: 12/09/22 12:18 AM   Specimen: BLOOD  Result Value Ref Range Status   Specimen Description BLOOD BLOOD RIGHT ARM  Final   Special Requests   Final    BOTTLES DRAWN AEROBIC AND ANAEROBIC Blood Culture results may not be optimal due to an excessive volume of blood received in culture bottles   Culture   Final    NO GROWTH 1 DAY Performed at [facility], [address],  [place], [place] [address]    Report Status PENDING  Incomplete  SARS Coronavirus 2 by RT PCR (hospital order, performed in [facility] hospital lab) *cepheid single result test* Anterior Nasal Swab     Status: None   Collection Time: 12/09/22 12:18 AM   Specimen: Anterior Nasal Swab  Result Value Ref Range Status   SARS Coronavirus 2 by RT PCR NEGATIVE NEGATIVE Final    Comment: (NOTE) SARS-CoV-2 target nucleic acids are NOT DETECTED.  The SARS-CoV-2 RNA is generally detectable in upper and lower respiratory specimens during the acute phase of infection. The lowest concentration of SARS-CoV-2 viral copies this assay can detect is 250 copies / mL. A negative result does not preclude SARS-CoV-2 infection and should not be used as the sole basis for treatment or other patient management decisions.  A negative result may occur with improper specimen collection / handling, submission of specimen other than nasopharyngeal swab, presence of viral mutation(s) within the areas targeted by this assay, and inadequate number of viral copies (<250 copies / mL). A negative result must be combined with clinical observations, patient history, and epidemiological information.  Fact Sheet for Patients:   RoadLapTop.co.za  Fact Sheet for Healthcare Providers: http://kim-miller.com/  This test is not yet approved or  cleared by the [place] FDA and has been authorized for detection and/or diagnosis of SARS-CoV-2 by FDA under an Emergency Use Authorization (EUA).  This EUA will remain in effect (meaning this test can be used) for the duration of the COVID-19 declaration under Section 564(b)(1) of the Act, 21 U.S.C. section 360bbb-3(b)(1), unless the authorization is terminated or revoked sooner.  Performed at [facility], [address] [place], [place] [address]   MRSA Next Gen by PCR, Nasal     Status: None   Collection Time: 12/09/22   2:31 PM   Specimen: Urine, Clean Catch; Nasal Swab  Result Value Ref Range Status   MRSA by PCR Next Gen NOT DETECTED NOT DETECTED Final    Comment: (NOTE) The GeneXpert MRSA Assay (FDA approved for NASAL specimens only), is one component of a comprehensive MRSA colonization surveillance program. It is not intended to diagnose MRSA infection nor to guide or monitor treatment for MRSA infections. Test performance is not FDA approved in patients less than 79 years old. Performed at [facility], [address] [place], [place] [address]   Respiratory (~20 pathogens) panel by PCR     Status: None   Collection Time: 12/09/22  4:14 PM   Specimen: Nasopharyngeal Swab; Respiratory  Result  Value Ref Range Status   Adenovirus NOT DETECTED NOT DETECTED Final   Coronavirus 229E NOT DETECTED NOT DETECTED Final    Comment: (NOTE) The Coronavirus on the Respiratory Panel, DOES NOT test for the novel  Coronavirus (2019 nCoV)    Coronavirus HKU1 NOT DETECTED NOT DETECTED Final   Coronavirus NL63 NOT DETECTED NOT DETECTED Final   Coronavirus OC43 NOT DETECTED NOT DETECTED Final   Metapneumovirus NOT DETECTED NOT DETECTED Final   Rhinovirus / Enterovirus NOT DETECTED NOT DETECTED Final   Influenza A NOT DETECTED NOT DETECTED Final   Influenza B NOT DETECTED NOT DETECTED Final   Parainfluenza Virus 1 NOT DETECTED NOT DETECTED Final   Parainfluenza Virus 2 NOT DETECTED NOT DETECTED Final   Parainfluenza Virus 3 NOT DETECTED NOT DETECTED Final   Parainfluenza Virus 4 NOT DETECTED NOT DETECTED Final   Respiratory Syncytial Virus NOT DETECTED NOT DETECTED Final   Bordetella pertussis NOT DETECTED NOT DETECTED Final   Bordetella Parapertussis NOT DETECTED NOT DETECTED Final   Chlamydophila pneumoniae NOT DETECTED NOT DETECTED Final   Mycoplasma pneumoniae NOT DETECTED NOT DETECTED Final    Comment: Performed at [facility], [address] [place], [place] [address]          Radiology Studies: CT ABDOMEN PELVIS W CONTRAST  Result Date: 12/09/2022 CLINICAL DATA:  Metastatic disease evaluation. History of vaginal cancer. Unknown date of diagnosis. EXAM: CT ABDOMEN AND PELVIS WITH CONTRAST TECHNIQUE: Multidetector CT imaging of the abdomen and pelvis was performed using the standard protocol following bolus administration of intravenous contrast. RADIATION DOSE REDUCTION: This exam was performed according to the departmental dose-optimization program which includes automated exposure control, adjustment of the mA and/or kV according to patient size and/or use of iterative reconstruction technique. CONTRAST:  OMNIPAQUE IOHEXOL 300 MG/ML  SOLN COMPARISON:  Only available body CT comparison is yesterday's CTA chest. No prior abdomen and pelvis CT. FINDINGS: Lower chest: The heart is slightly enlarged. There is no significant pericardial effusion. Elevated right hemidiaphragm is again shown. There are trace pleural effusions again noted as well as heterogeneous patchy ground-glass opacities in the left upper lobe visualized portion with underlying interstitial thickening, similar opacities interval increased in the medial right middle lobe and to a lesser extent in the lower lobes where the opacities are intermixed with coarse atelectatic markings. Consider multifocal pneumonia including viral and atypical etiologies. Hepatobiliary: The liver is moderately steatotic without mass enhancement. No significant liver enlargement. The gallbladder and bile ducts are unremarkable. Pancreas: Partially atrophic, otherwise unremarkable. Spleen: No abnormality. Adrenals/Urinary Tract: Adrenal glands are unremarkable. Kidneys are normal, without renal calculi, focal lesion, or hydronephrosis. Bladder is unremarkable. Stomach/Bowel: No dilatation or wall thickening including the retrocecal appendix. Uncomplicated sigmoid diverticulosis. Normal small stool burden. Vascular/Lymphatic:  Aortic atherosclerosis. No enlarged abdominal or pelvic lymph nodes. Reproductive: The uterus is either small and atrophic or the patient could have had a supracervical hysterectomy. The ovaries are not enlarged. There is a small calcified fibroid in the right wall of the uterus or cervix remnant. There are metallic structures along either side of the vaginal cuff which could be surgical clips or fiducial markers. There is no overt thickening of the vaginal cuff. Other: Small umbilical fat hernia. No incarcerated hernia. No free fluid, free hemorrhage or free air, or acute inflammatory changes. Musculoskeletal: Mild degenerative change of the spine. No destructive bone lesions. IMPRESSION: 1. No acute findings or CT evidence of metastatic disease in the abdomen or pelvis.  2. Moderate hepatic steatosis. 3. Aortic atherosclerosis. 4. Diverticulosis without evidence of diverticulitis. 5. Trace pleural effusions with heterogeneous ground-glass opacities and interstitial thickening in the visualized lung fields. Consider multifocal pneumonia including viral and atypical etiologies. 6. Small umbilical fat hernia. 7. Metallic structures along either side of the vaginal cuff which could be surgical clips or fiducial markers. There is no overt thickening of the vaginal cuff. Aortic Atherosclerosis (ICD10-I70.0). Electronically Signed   By: Almira Bar M.D.   On: 12/09/2022 20:26   US Venous Img Lower Bilateral (DVT)  Result Date: 12/09/2022 CLINICAL DATA:  77 year old female with history of bilateral leg weakness. EXAM: BILATERAL LOWER EXTREMITY VENOUS DOPPLER ULTRASOUND TECHNIQUE: Gray-scale sonography with graded compression, as well as color Doppler and duplex ultrasound were performed to evaluate the lower extremity deep venous systems from the level of the common femoral vein and including the common femoral, femoral, profunda femoral, popliteal and calf veins including the posterior tibial, peroneal and  gastrocnemius veins when visible. The superficial great saphenous vein was also interrogated. Spectral Doppler was utilized to evaluate flow at rest and with distal augmentation maneuvers in the common femoral, femoral and popliteal veins. COMPARISON:  None Available. FINDINGS: RIGHT LOWER EXTREMITY Common Femoral Vein: No evidence of thrombus. Normal compressibility, respiratory phasicity and response to augmentation. Saphenofemoral Junction: No evidence of thrombus. Normal compressibility and flow on color Doppler imaging. Profunda Femoral Vein: No evidence of thrombus. Normal compressibility and flow on color Doppler imaging. Femoral Vein: No evidence of thrombus. Normal compressibility, respiratory phasicity and response to augmentation. Popliteal Vein: No evidence of thrombus. Normal compressibility, respiratory phasicity and response to augmentation. Calf Veins: No evidence of thrombus. Normal compressibility and flow on color Doppler imaging. Superficial Great Saphenous Vein: No evidence of thrombus. Normal compressibility. Venous Reflux:  None. Other Findings: In the popliteal fossa there is a 1.5 x 0.6 x 1.9 cm heterogeneously hypoechoic lesion with increased through transmission, ovoid in shape, without internal blood flow, presumably a Baker's cyst. LEFT LOWER EXTREMITY Common Femoral Vein: No evidence of thrombus. Normal compressibility, respiratory phasicity and response to augmentation. Saphenofemoral Junction: No evidence of thrombus. Normal compressibility and flow on color Doppler imaging. Profunda Femoral Vein: No evidence of thrombus. Normal compressibility and flow on color Doppler imaging. Femoral Vein: No evidence of thrombus. Normal compressibility, respiratory phasicity and response to augmentation. Popliteal Vein: No evidence of thrombus. Normal compressibility, respiratory phasicity and response to augmentation. Calf Veins: No evidence of thrombus. Normal compressibility and flow on color  Doppler imaging. Superficial Great Saphenous Vein: No evidence of thrombus. Normal compressibility. Venous Reflux:  None. Other Findings:  None. IMPRESSION: 1. No evidence of deep venous thrombosis in either lower extremity. 2. Small right-sided Baker's cyst incidentally noted, as above. Electronically Signed   By: Trudie Reed M.D.   On: 12/09/2022 05:37   MR THORACIC SPINE W WO CONTRAST  Result Date: 12/09/2022 CLINICAL DATA:  Ataxia, concern for metastatic disease EXAM: MRI THORACIC AND LUMBAR SPINE WITHOUT AND WITH CONTRAST TECHNIQUE: Multiplanar and multiecho pulse sequences of the thoracic and lumbar spine were obtained without and with intravenous contrast. CONTRAST:  8mL GADAVIST GADOBUTROL 1 MMOL/ML IV SOLN COMPARISON:  No prior MRI of the thoracic or lumbar spine available. FINDINGS: MRI THORACIC SPINE FINDINGS Alignment: Preservation of the normal thoracic kyphosis. No listhesis. Mild S-shaped curvature of the thoracolumbar spine. Vertebrae: No acute fracture or evidence of discitis. T2 hyperintense, contrast-enhancing lesions in the right posterior elements at T6 (series 13, image 5 and  series 8, image 5), and T10 (series 13, image 6 and series 8, image 6) could represent metastatic lesions in the T5 right pedicle and transverse process and T10 right facet. A T1 isointense, mildly T2 hyperintense, and minimally enhancing lesion in the T10 vertebral body is nonspecific and could represent a suspicious lesion or an atypical hemangioma. Cord:  Normal signal and morphology.  No abnormal enhancement. Paraspinal and other soft tissues: Trace bilateral pleural effusions. Airspace opacities are better visualized on 12/08/2022 CTA chest. Disc levels: Mild degenerative changes without significant spinal canal stenosis or neural foraminal narrowing. MRI LUMBAR SPINE FINDINGS Segmentation:  5 lumbar type vertebral bodies. Alignment: No significant listhesis. Preservation of the normal thoracic lordosis. Mild  levocurvature. Vertebrae: No acute fracture or evidence of discitis. 5 mm focus of increased T2 signal and enhancement in the L1 vertebral body (series 6, image 11), which is nonspecific but could represent a metastatic lesion Conus medullaris: Extends to the L1-L2 level and appears normal. No abnormal enhancement. Paraspinal and other soft tissues: Atrophy of the inferior paraspinous musculature. No lymphadenopathy. Disc levels: T12-L1: No significant disc bulge. No spinal canal stenosis or neural foraminal narrowing. L1-L2: No significant disc bulge. No spinal canal stenosis or neural foraminal narrowing. L2-L3: No significant disc bulge. No spinal canal stenosis or neural foraminal narrowing. L3-L4: Left foraminal and extreme lateral disc protrusion. No spinal canal stenosis or neural foraminal narrowing. L4-L5: Mild disc bulge. Mild facet arthropathy. Narrowing of the lateral recesses. No spinal canal stenosis. Mild bilateral neural foraminal narrowing. L5-S1: Minimal disc bulge with left subarticular and foraminal protrusion. No spinal canal stenosis. Mild left neural foraminal narrowing. IMPRESSION: 1. Small enhancing lesions in the right posterior elements at T6 and T10, as well as possibly at L1, which could represent metastatic disease. A lesion in the T10 vertebral body has a slightly different signal characteristics and could represent a suspicious lesion or an atypical hemangioma. 2. No spinal canal stenosis or neural foraminal narrowing in the thoracic spine. 3. L4-L5 mild bilateral neural foraminal narrowing. Narrowing of the lateral recesses at this level could affect the descending L5 nerve roots. 4. L5-S1 mild left neural foraminal narrowing. Electronically Signed   By: Wiliam Ke M.D.   On: 12/09/2022 03:41   MR Lumbar Spine W Wo Contrast  Result Date: 12/09/2022 CLINICAL DATA:  Ataxia, concern for metastatic disease EXAM: MRI THORACIC AND LUMBAR SPINE WITHOUT AND WITH CONTRAST TECHNIQUE:  Multiplanar and multiecho pulse sequences of the thoracic and lumbar spine were obtained without and with intravenous contrast. CONTRAST:  8mL GADAVIST GADOBUTROL 1 MMOL/ML IV SOLN COMPARISON:  No prior MRI of the thoracic or lumbar spine available. FINDINGS: MRI THORACIC SPINE FINDINGS Alignment: Preservation of the normal thoracic kyphosis. No listhesis. Mild S-shaped curvature of the thoracolumbar spine. Vertebrae: No acute fracture or evidence of discitis. T2 hyperintense, contrast-enhancing lesions in the right posterior elements at T6 (series 13, image 5 and series 8, image 5), and T10 (series 13, image 6 and series 8, image 6) could represent metastatic lesions in the T5 right pedicle and transverse process and T10 right facet. A T1 isointense, mildly T2 hyperintense, and minimally enhancing lesion in the T10 vertebral body is nonspecific and could represent a suspicious lesion or an atypical hemangioma. Cord:  Normal signal and morphology.  No abnormal enhancement. Paraspinal and other soft tissues: Trace bilateral pleural effusions. Airspace opacities are better visualized on 12/08/2022 CTA chest. Disc levels: Mild degenerative changes without significant spinal canal stenosis or neural  foraminal narrowing. MRI LUMBAR SPINE FINDINGS Segmentation:  5 lumbar type vertebral bodies. Alignment: No significant listhesis. Preservation of the normal thoracic lordosis. Mild levocurvature. Vertebrae: No acute fracture or evidence of discitis. 5 mm focus of increased T2 signal and enhancement in the L1 vertebral body (series 6, image 11), which is nonspecific but could represent a metastatic lesion Conus medullaris: Extends to the L1-L2 level and appears normal. No abnormal enhancement. Paraspinal and other soft tissues: Atrophy of the inferior paraspinous musculature. No lymphadenopathy. Disc levels: T12-L1: No significant disc bulge. No spinal canal stenosis or neural foraminal narrowing. L1-L2: No significant disc  bulge. No spinal canal stenosis or neural foraminal narrowing. L2-L3: No significant disc bulge. No spinal canal stenosis or neural foraminal narrowing. L3-L4: Left foraminal and extreme lateral disc protrusion. No spinal canal stenosis or neural foraminal narrowing. L4-L5: Mild disc bulge. Mild facet arthropathy. Narrowing of the lateral recesses. No spinal canal stenosis. Mild bilateral neural foraminal narrowing. L5-S1: Minimal disc bulge with left subarticular and foraminal protrusion. No spinal canal stenosis. Mild left neural foraminal narrowing. IMPRESSION: 1. Small enhancing lesions in the right posterior elements at T6 and T10, as well as possibly at L1, which could represent metastatic disease. A lesion in the T10 vertebral body has a slightly different signal characteristics and could represent a suspicious lesion or an atypical hemangioma. 2. No spinal canal stenosis or neural foraminal narrowing in the thoracic spine. 3. L4-L5 mild bilateral neural foraminal narrowing. Narrowing of the lateral recesses at this level could affect the descending L5 nerve roots. 4. L5-S1 mild left neural foraminal narrowing. Electronically Signed   By: Wiliam Ke M.D.   On: 12/09/2022 03:41   CT Angio Chest PE W and/or Wo Contrast  Result Date: 12/08/2022 CLINICAL DATA:  High probability for PE EXAM: CT ANGIOGRAPHY CHEST WITH CONTRAST TECHNIQUE: Multidetector CT imaging of the chest was performed using the standard protocol during bolus administration of intravenous contrast. Multiplanar CT image reconstructions and MIPs were obtained to evaluate the vascular anatomy. RADIATION DOSE REDUCTION: This exam was performed according to the departmental dose-optimization program which includes automated exposure control, adjustment of the mA and/or kV according to patient size and/or use of iterative reconstruction technique. CONTRAST:  75mL OMNIPAQUE IOHEXOL 350 MG/ML SOLN COMPARISON:  None Available. FINDINGS:  Cardiovascular: The heart is enlarged. There is no pericardial effusion. The aorta is normal in size. There are atherosclerotic calcifications of the aorta. There is adequate opacification of the pulmonary arteries to the segmental level. There is no evidence for pulmonary embolism. Mediastinum/Nodes: There are diffuse nonenlarged mediastinal and hilar lymph nodes. Visualized esophagus and thyroid gland are within normal limits. Lungs/Pleura: There are multifocal interstitial, airspace and ground-glass patchy opacities predominantly in the bilateral upper lobes but also minimally in the left lower lobe. There are trace bilateral pleural effusions. There is no pneumothorax. Trachea and central airways are patent. Upper Abdomen: Heterogeneous hypodensity throughout the right lobe of the liver is incompletely characterized on this study. Musculoskeletal: No chest wall abnormality. No acute or significant osseous findings. Review of the MIP images confirms the above findings. IMPRESSION: 1. No evidence for pulmonary embolism. 2. Multifocal interstitial, airspace and ground-glass opacities predominantly in the bilateral upper lobes but also minimally in the left lower lobe. Findings are concerning for multifocal pneumonia. COVID pneumonia can have this appearance. 3. Trace bilateral pleural effusions. 4. Cardiomegaly. 5. Heterogeneous hypodensity throughout the right lobe of the liver is incompletely characterized on this study. Differential diagnosis includes metastatic  disease or infection. Recommend further evaluation with dedicated CT or MRI of the abdomen with contrast. Aortic Atherosclerosis (ICD10-I70.0). Electronically Signed   By: Darliss Cheney M.D.   On: 12/08/2022 23:15   CT HEAD WO CONTRAST ( )  Result Date: 12/08/2022 CLINICAL DATA:  Mental status change, unknown cause EXAM: CT HEAD WITHOUT CONTRAST TECHNIQUE: Contiguous axial images were obtained from the base of the skull through the vertex without  intravenous contrast. RADIATION DOSE REDUCTION: This exam was performed according to the departmental dose-optimization program which includes automated exposure control, adjustment of the mA and/or kV according to patient size and/or use of iterative reconstruction technique. COMPARISON:  05/11/2020, MRI 12/04/2022 FINDINGS: Brain: 2.2 cm extra-axial low-density meningioma again seen along the right side of the falx, unchanged in size since recent MRI. Overlying adjacent brain edema, stable. No acute infarct, hemorrhage or hydrocephalus. Vascular: No hyperdense vessel or unexpected calcification. Skull: No acute calvarial abnormality. Sinuses/Orbits: No acute findings Other: None IMPRESSION: Stable meningioma along the high right falx with mild adjacent brain edema, unchanged since recent study. No acute intracranial abnormality. Electronically Signed   By: Charlett Nose M.D.   On: 12/08/2022 22:36   DG Chest 2 View  Result Date: 12/08/2022 CLINICAL DATA:  Weakness. EXAM: CHEST - 2 VIEW COMPARISON:  Chest radiograph dated 05/11/2020. FINDINGS: Bilateral streaky interstitial densities, left greater than right, most consistent with developing infiltrate. No pleural effusion or pneumothorax. The cardiac silhouette is within normal limits. Atherosclerotic calcification of the aortic arch. No acute osseous pathology. IMPRESSION: Developing bilateral infiltrates. Electronically Signed   By: Elgie Collard M.D.   On: 12/08/2022 18:08        Scheduled Meds:  aspirin EC  81 mg Oral Daily   cholecalciferol  1,000 Units Oral Daily   heparin  5,000 Units Subcutaneous Q8H   insulin aspart  0-15 Units Subcutaneous TID WC   latanoprost  1 drop Right Eye QHS   multivitamin with minerals  1 tablet Oral Daily   pantoprazole  40 mg Oral Daily   rosuvastatin  10 mg Oral Daily   sucralfate  1 g Oral TID WC & HS   Continuous Infusions:     LOS: 0 days      Tresa Moore, MD [facility]   If  7PM-7AM, please contact night-coverage  12/10/2022, 2:07 PM

## 2022-12-10 NOTE — Plan of Care (Signed)
Problem: Fluid Volume: Goal: Hemodynamic stability will improve 12/10/2022 0523 by Arlys John, RN Outcome: Progressing 12/09/2022 2337 by Arlys John, RN Outcome: Progressing   Problem: Clinical Measurements: Goal: Diagnostic test results will improve 12/10/2022 0523 by Arlys John, RN Outcome: Progressing 12/09/2022 2337 by Arlys John, RN Outcome: Progressing Goal: Signs and symptoms of infection will decrease 12/10/2022 0523 by Arlys John, RN Outcome: Progressing 12/09/2022 2337 by Arlys John, RN Outcome: Progressing   Problem: Respiratory: Goal: Ability to maintain adequate ventilation will improve 12/10/2022 0523 by Arlys John, RN Outcome: Progressing 12/09/2022 2337 by Arlys John, RN Outcome: Progressing   Problem: Education: Goal: Ability to describe self-care measures that may prevent or decrease complications (Diabetes Survival Skills Education) will improve 12/10/2022 0523 by Arlys John, RN Outcome: Progressing 12/09/2022 2337 by Arlys John, RN Outcome: Progressing Goal: Individualized Educational Video(s) 12/10/2022 0523 by Arlys John, RN Outcome: Progressing 12/09/2022 2337 by Arlys John, RN Outcome: Progressing   Problem: Coping: Goal: Ability to adjust to condition or change in health will improve 12/10/2022 0523 by Arlys John, RN Outcome: Progressing 12/09/2022 2337 by Arlys John, RN Outcome: Progressing   Problem: Fluid Volume: Goal: Ability to maintain a balanced intake and output will improve 12/10/2022 0523 by Arlys John, RN Outcome: Progressing 12/09/2022 2337 by Arlys John, RN Outcome: Progressing   Problem: Health Behavior/Discharge Planning: Goal: Ability to identify and utilize available resources and services will improve 12/10/2022 0523 by Arlys John,  RN Outcome: Progressing 12/09/2022 2337 by Arlys John, RN Outcome: Progressing Goal: Ability to manage health-related needs will improve 12/10/2022 0523 by Arlys John, RN Outcome: Progressing 12/09/2022 2337 by Arlys John, RN Outcome: Progressing   Problem: Metabolic: Goal: Ability to maintain appropriate glucose levels will improve 12/10/2022 0523 by Arlys John, RN Outcome: Progressing 12/09/2022 2337 by Arlys John, RN Outcome: Progressing   Problem: Nutritional: Goal: Maintenance of adequate nutrition will improve 12/10/2022 0523 by Arlys John, RN Outcome: Progressing 12/09/2022 2337 by Arlys John, RN Outcome: Progressing Goal: Progress toward achieving an optimal weight will improve 12/10/2022 0523 by Arlys John, RN Outcome: Progressing 12/09/2022 2337 by Arlys John, RN Outcome: Progressing   Problem: Skin Integrity: Goal: Risk for impaired skin integrity will decrease 12/10/2022 0523 by Arlys John, RN Outcome: Progressing 12/09/2022 2337 by Arlys John, RN Outcome: Progressing   Problem: Tissue Perfusion: Goal: Adequacy of tissue perfusion will improve 12/10/2022 0523 by Arlys John, RN Outcome: Progressing 12/09/2022 2337 by Arlys John, RN Outcome: Progressing   Problem: Education: Goal: Knowledge of General Education information will improve Description: Including pain rating scale, medication(s)/side effects and non-pharmacologic comfort measures 12/10/2022 0523 by Arlys John, RN Outcome: Progressing 12/09/2022 2337 by Arlys John, RN Outcome: Progressing   Problem: Health Behavior/Discharge Planning: Goal: Ability to manage health-related needs will improve 12/10/2022 0523 by Arlys John, RN Outcome: Progressing 12/09/2022 2337 by Arlys John, RN Outcome: Progressing    Problem: Clinical Measurements: Goal: Ability to maintain clinical measurements within normal limits will improve 12/10/2022 0523 by Arlys John, RN Outcome: Progressing 12/09/2022 2337 by Arlys John, RN Outcome: Progressing Goal: Will remain free from infection 12/10/2022 0523 by Arlys John, RN Outcome: Progressing 12/09/2022 2337 by Arlys John, RN Outcome: Progressing Goal: Diagnostic test results will improve 12/10/2022 0523 by Arlys John, RN Outcome: Progressing 12/09/2022  2337 by Arlys John, RN Outcome: Progressing Goal: Respiratory complications will improve 12/10/2022 0523 by Arlys John, RN Outcome: Progressing 12/09/2022 2337 by Arlys John, RN Outcome: Progressing Goal: Cardiovascular complication will be avoided 12/10/2022 0523 by Arlys John, RN Outcome: Progressing 12/09/2022 2337 by Arlys John, RN Outcome: Progressing   Problem: Activity: Goal: Risk for activity intolerance will decrease 12/10/2022 0523 by Arlys John, RN Outcome: Progressing 12/09/2022 2337 by Arlys John, RN Outcome: Progressing   Problem: Nutrition: Goal: Adequate nutrition will be maintained 12/10/2022 0523 by Arlys John, RN Outcome: Progressing 12/09/2022 2337 by Arlys John, RN Outcome: Progressing   Problem: Coping: Goal: Level of anxiety will decrease 12/10/2022 0523 by Arlys John, RN Outcome: Progressing 12/09/2022 2337 by Arlys John, RN Outcome: Progressing   Problem: Elimination: Goal: Will not experience complications related to bowel motility 12/10/2022 0523 by Arlys John, RN Outcome: Progressing 12/09/2022 2337 by Arlys John, RN Outcome: Progressing Goal: Will not experience complications related to urinary retention 12/10/2022 0523 by Arlys John, RN Outcome:  Progressing 12/09/2022 2337 by Arlys John, RN Outcome: Progressing   Problem: Pain Managment: Goal: General experience of comfort will improve 12/10/2022 0523 by Arlys John, RN Outcome: Progressing 12/09/2022 2337 by Arlys John, RN Outcome: Progressing   Problem: Skin Integrity: Goal: Risk for impaired skin integrity will decrease 12/10/2022 0523 by Arlys John, RN Outcome: Progressing 12/09/2022 2337 by Arlys John, RN Outcome: Progressing

## 2022-12-10 NOTE — Progress Notes (Deleted)
Name: Vickie Stewart   MRN: [id number]    DOB: 1945/07/18   Date:12/10/2022       Progress Note  Subjective  Chief Complaint  Hospital Discharge Follow-Up  HPI  Admitted: 12/08/22 Discharged:  Patient Active Problem List   Diagnosis Date Noted   PNA (pneumonia) 12/09/2022   Acute respiratory failure with hypoxia (HCC) 12/09/2022   Bilateral leg weakness 12/09/2022   Lower extremity weakness 12/04/2022   Thrombocytopenia (HCC) 12/04/2022   Pre-diabetes 12/04/2022   ASCUS of cervix with negative high risk HPV 01/07/2022   Occipital neuralgia of left side 01/07/2022   Balance problems 01/07/2022   Vertigo 01/07/2022   Pulsatile tinnitus of right ear 06/18/2020   Pulsatile neck mass 06/18/2020   OSA (obstructive sleep apnea) 09/19/2019   Anxiety and depression 09/19/2019   Other neutropenia (HCC) 08/11/2019   Left thyroid nodule 08/08/2019   Chronic neck and back pain 01/03/2019   Meningioma (HCC) 10/04/2017   Lichen sclerosus 03/24/2016   Vaginal atrophy 03/18/2016   Encounter for follow-up surveillance of vaginal cancer 03/18/2016   Eczema 10/31/2015   Left hand pain 09/10/2015   Osteopenia after menopause 07/26/2015   Menopause 07/26/2015   Irritable colon 07/26/2015   History of cancer of vagina 07/26/2015   Diffuse cystic mastopathy 07/26/2015   Obesity (BMI 30.0-34.9) 07/26/2015   Intermittent low back pain 07/26/2015   Hyperlipemia 07/26/2015   Osteoarthritis 05/13/2015   Allergic rhinitis 05/13/2015   Gastroesophageal reflux disease without esophagitis 05/13/2015   Essential hypertension, benign     Past Surgical History:  Procedure Laterality Date   BREAST EXCISIONAL BIOPSY Left    neg   BREAST LUMPECTOMY Left    COLONOSCOPY  08/26/2006   COLONOSCOPY WITH PROPOFOL N/A 10/21/2016   Procedure: COLONOSCOPY WITH PROPOFOL;  Surgeon: Kieth Brightly, MD;  Location: [facility] ENDOSCOPY;  Service: Endoscopy;  Laterality: N/A;   ESOPHAGOGASTRODUODENOSCOPY  (EGD) WITH PROPOFOL N/A 02/07/2020   Procedure: ESOPHAGOGASTRODUODENOSCOPY (EGD) WITH PROPOFOL;  Surgeon: Toney Reil, MD;  Location: [facility] ENDOSCOPY;  Service: Gastroenterology;  Laterality: N/A;    Family History  Problem Relation Age of Onset   Pulmonary embolism Mother    Hypertension Father    Aortic stenosis Father    Breast cancer Neg Hx     Social History   Tobacco Use   Smoking status: Never   Smokeless tobacco: Never   Tobacco comments:    experimented with dip snuff as a child  Substance Use Topics   Alcohol use: No    No current facility-administered medications for this visit. No current outpatient medications on file.  Facility-Administered Medications Ordered in Other Visits:    aspirin EC tablet 81 mg, 81 mg, Oral, Daily, Irena Cords V, MD, 81 mg at 12/10/22 0944   cholecalciferol (VITAMIN D3) 25 MCG (1000 UNIT) tablet 1,000 Units, 1,000 Units, Oral, Daily, Irena Cords V, MD, 1,000 Units at 12/10/22 0944   heparin injection 5,000 Units, 5,000 Units, Subcutaneous, Q8H, Irena Cords V, MD, 5,000 Units at 12/10/22 0600   insulin aspart (novoLOG) injection 0-15 Units, 0-15 Units, Subcutaneous, TID WC, Gertha Calkin, MD, 3 Units at 12/10/22 0855   latanoprost (XALATAN) 0.005 % ophthalmic solution 1 drop, 1 drop, Right Eye, QHS, Sreenath, Sudheer B, MD   multivitamin with minerals tablet 1 tablet, 1 tablet, Oral, Daily, Sreenath, Sudheer B, MD, 1 tablet at 12/10/22 0944   pantoprazole (PROTONIX) EC tablet 40 mg, 40 mg, Oral, Daily, Sreenath, Sudheer B, MD, 40 mg  at 12/10/22 0943   phenol (CHLORASEPTIC) mouth spray 1 spray, 1 spray, Mouth/Throat, PRN, Sreenath, Sudheer B, MD   rosuvastatin (CRESTOR) tablet 10 mg, 10 mg, Oral, Daily, Sreenath, Sudheer B, MD, 10 mg at 12/10/22 0944   sucralfate (CARAFATE) tablet 1 g, 1 g, Oral, TID WC & HS, Georgeann Oppenheim, Sudheer B, MD, 1 g at 12/10/22 1610  Allergies  Allergen Reactions   Atorvastatin     difficulty concentrating and  focusing   Terbinafine And Related Dermatitis    Rash, itch, skin discoloration   Zetia [Ezetimibe] Other (See Comments)    Muscle/joint pain    I personally reviewed active problem list, medication list, allergies, family history, social history, health maintenance with the patient/caregiver today.   ROS  ***  Objective  There were no vitals filed for this visit.  There is no height or weight on file to calculate BMI.  Physical Exam ***   PHQ2/9:    09/28/2022   10:54 AM 07/10/2022   10:41 AM 01/07/2022   10:34 AM 10/28/2021    2:29 PM 09/03/2021    1:52 PM  Depression screen PHQ 2/9  Decreased Interest 0 0 0 0 0  Down, Depressed, Hopeless 0 0 0 0 0  PHQ - 2 Score 0 0 0 0 0  Altered sleeping 0 0  0 0  Tired, decreased energy 0 0  0 0  Change in appetite 0 0  0 0  Feeling bad or failure about yourself  0 0  0 0  Trouble concentrating 0 0  0 0  Moving slowly or fidgety/restless 0 0  0 0  Suicidal thoughts 0 0  0 0  PHQ-9 Score 0 0  0 0  Difficult doing work/chores    Not difficult at all     phq 9 is {gen pos [id number]}   Fall Risk:    09/28/2022   10:53 AM 07/10/2022   10:40 AM 01/07/2022   10:34 AM 10/28/2021    2:27 PM 09/03/2021    1:52 PM  Fall Risk   Falls in the past year? 0 0 0 1 1  Number falls in past yr: 0  0 0 0  Injury with Fall? 0  0 1 1  Risk for fall due to : No Fall Risks No Fall Risks No Fall Risks    Follow up Falls prevention discussed Falls prevention discussed;Education provided;Falls evaluation completed Falls prevention discussed  Falls prevention discussed      Functional Status Survey:      Assessment & Plan   1. Hospital discharge follow-up ***

## 2022-12-10 NOTE — Progress Notes (Signed)
   12/10/22 2220  BiPAP/CPAP/SIPAP  Reason BIPAP/CPAP not in use Non-compliant (Patient declined CPAP at this time, resmed unit at bedside on standby)  Flow Rate 3 lpm

## 2022-12-10 NOTE — Consult Note (Signed)
[facility]  [phone] [phone]  ID: Vickie Stewart OB: 15-Sep-1945  MR#: [id number]  AOZ#:[id number]  Patient Care Team: Alba Cory, MD as PCP - General (Family Medicine) Kieth Brightly, MD (General Surgery) Tedd Sias Marlana Salvage, MD as Physician Assistant (Endocrinology)  CHIEF COMPLAINT: Meningioma, now with increasing lower extremity weakness.  INTERVAL HISTORY: Patient is a 77 year old female who recently finished XRT on a slowly growing meningioma.  No pathology was obtained, but treatment was initiated secondary to increasing headache.  Patient was also placed on long-term high-dose prednisone.  She reports lower extremity weakness initiating towards the end of XRT that has progressively become worse.  She was recently discharged from the hospital on July 6 with the same complaints and states that she has had no changes.  She can no longer walk independently.  She has no other neurologic complaints.  She denies any recent fevers or illnesses.  She has a fair appetite, but denies weight loss.  She has no chest pain, shortness of breath, cough, or hemoptysis.  She denies any nausea, vomiting, constipation, or diarrhea.  She has no urinary complaints.  Patient offers no further specific complaints today.  REVIEW OF SYSTEMS:   Review of Systems  Constitutional:  Positive for malaise/fatigue. Negative for fever and weight loss.  Respiratory: Negative.  Negative for cough, hemoptysis and shortness of breath.   Cardiovascular: Negative.  Negative for chest pain and leg swelling.  Gastrointestinal: Negative.  Negative for abdominal pain.  Genitourinary: Negative.  Negative for dysuria.  Musculoskeletal: Negative.  Negative for back pain.  Skin: Negative.  Negative for rash.  Neurological:  Positive for focal weakness and weakness. Negative for dizziness and headaches.  Psychiatric/Behavioral: Negative.  The patient is not nervous/anxious.      As per HPI. Otherwise, a complete review of systems is negative.  PAST MEDICAL HISTORY: Past Medical History:  Diagnosis Date   Allergic rhinitis, cause unspecified    Anxiety state, unspecified    Contact dermatitis and other eczema, due to unspecified cause    Diffuse cystic mastopathy    Dyspepsia and other specified disorders of function of stomach    Essential hypertension, benign    Glaucoma    Irritable bowel syndrome    Lateral epicondylitis  of elbow    Leukocytopenia, unspecified    Mastodynia    Obesity, unspecified    Pure hypercholesterolemia    Reflux esophagitis    Thoracic or lumbosacral neuritis or radiculitis, unspecified    Unspecified disorder of skin and subcutaneous tissue    Vaginal cancer (HCC)    History    PAST SURGICAL HISTORY: Past Surgical History:  Procedure Laterality Date   BREAST EXCISIONAL BIOPSY Left    neg   BREAST LUMPECTOMY Left    COLONOSCOPY  08/26/2006   COLONOSCOPY WITH PROPOFOL N/A 10/21/2016   Procedure: COLONOSCOPY WITH PROPOFOL;  Surgeon: Kieth Brightly, MD;  Location: [facility] ENDOSCOPY;  Service: Endoscopy;  Laterality: N/A;   ESOPHAGOGASTRODUODENOSCOPY (EGD) WITH PROPOFOL N/A 02/07/2020   Procedure: ESOPHAGOGASTRODUODENOSCOPY (EGD) WITH PROPOFOL;  Surgeon: Toney Reil, MD;  Location: [facility] ENDOSCOPY;  Service: Gastroenterology;  Laterality: N/A;    FAMILY HISTORY: Family History  Problem Relation Age of Onset   Pulmonary embolism Mother    Hypertension Father    Aortic stenosis Father    Breast cancer Neg Hx     ADVANCED DIRECTIVES (Y/N):  @ADVDIR @  HEALTH MAINTENANCE: Social History   Tobacco Use   Smoking status:  Never   Smokeless tobacco: Never   Tobacco comments:    experimented with dip snuff as a child  Vaping Use   Vaping status: Never Used  Substance Use Topics   Alcohol use: No   Drug use: No     Colonoscopy:  PAP:  Bone density:  Lipid panel:  Allergies  Allergen Reactions    Atorvastatin     difficulty concentrating and focusing   Terbinafine And Related Dermatitis    Rash, itch, skin discoloration   Zetia [Ezetimibe] Other (See Comments)    Muscle/joint pain    Current Facility-Administered Medications  Medication Dose Route Frequency Provider Last Rate Last Admin   aspirin EC tablet 81 mg  81 mg Oral Daily Gertha Calkin, MD   81 mg at 12/10/22 0944   cholecalciferol (VITAMIN D3) 25 MCG (1000 UNIT) tablet 1,000 Units  1,000 Units Oral Daily Gertha Calkin, MD   1,000 Units at 12/10/22 0944   heparin injection 5,000 Units  5,000 Units Subcutaneous Q8H Irena Cords V, MD   5,000 Units at 12/10/22 1447   insulin aspart (novoLOG) injection 0-15 Units  0-15 Units Subcutaneous TID WC Gertha Calkin, MD   3 Units at 12/10/22 0855   latanoprost (XALATAN) 0.005 % ophthalmic solution 1 drop  1 drop Right Eye QHS Sreenath, Sudheer B, MD       multivitamin with minerals tablet 1 tablet  1 tablet Oral Daily Lolita Patella B, MD   1 tablet at 12/10/22 0944   pantoprazole (PROTONIX) EC tablet 40 mg  40 mg Oral Daily Lolita Patella B, MD   40 mg at 12/10/22 0943   phenol (CHLORASEPTIC) mouth spray 1 spray  1 spray Mouth/Throat PRN Sreenath, Sudheer B, MD       rosuvastatin (CRESTOR) tablet 10 mg  10 mg Oral Daily Georgeann Oppenheim, Sudheer B, MD   10 mg at 12/10/22 0944   sucralfate (CARAFATE) tablet 1 g  1 g Oral TID WC & HS Georgeann Oppenheim, Sudheer B, MD   1 g at 12/10/22 1723    OBJECTIVE: Vitals:   12/10/22 0733 12/10/22 1509  BP: (!) 97/54 (!) 99/53  Pulse: 84 87  Resp: 18 16  Temp: 98 F (36.7 C) 98.9 F (37.2 C)  SpO2: 98% 99%     Body mass index is 33.29 kg/m.    ECOG FS:4 - Bedbound  General: Well-developed, well-nourished, no acute distress. Eyes: Pink conjunctiva, anicteric sclera. HEENT: Normocephalic, moist mucous membranes. Lungs: No audible wheezing or coughing. Heart: Regular rate and rhythm. Abdomen: Soft, nontender, no obvious distention. Musculoskeletal:  No edema, cyanosis, or clubbing. Neuro: Alert, answering all questions appropriately. Cranial nerves grossly intact. Skin: No rashes or petechiae noted. Psych: Normal affect. Lymphatics: No cervical, calvicular, axillary or inguinal LAD.   LAB RESULTS:  Lab Results  Component Value Date   NA 137 12/08/2022   K 3.7 12/08/2022   CL 103 12/08/2022   CO2 23 12/08/2022   GLUCOSE 112 (H) 12/08/2022   BUN 12 12/08/2022   CREATININE 0.52 12/10/2022   CALCIUM 8.3 (L) 12/08/2022   PROT 5.8 (L) 12/08/2022   ALBUMIN 2.5 (L) 12/08/2022   AST 38 12/08/2022   ALT 39 12/08/2022   ALKPHOS 58 12/08/2022   BILITOT 0.7 12/08/2022   GFRNONAA >60 12/10/2022   GFRAA 88 08/13/2020    Lab Results  Component Value Date   WBC 5.4 12/09/2022   NEUTROABS 1,037 (L) 09/28/2022   HGB 10.1 (L) 12/09/2022  HCT 30.7 (L) 12/09/2022   MCV 89.5 12/09/2022   PLT 136 (L) 12/09/2022     STUDIES: CT ABDOMEN PELVIS W CONTRAST  Result Date: 12/09/2022 CLINICAL DATA:  Metastatic disease evaluation. History of vaginal cancer. Unknown date of diagnosis. EXAM: CT ABDOMEN AND PELVIS WITH CONTRAST TECHNIQUE: Multidetector CT imaging of the abdomen and pelvis was performed using the standard protocol following bolus administration of intravenous contrast. RADIATION DOSE REDUCTION: This exam was performed according to the departmental dose-optimization program which includes automated exposure control, adjustment of the mA and/or kV according to patient size and/or use of iterative reconstruction technique. CONTRAST:  OMNIPAQUE IOHEXOL 300 MG/ML  SOLN COMPARISON:  Only available body CT comparison is yesterday's CTA chest. No prior abdomen and pelvis CT. FINDINGS: Lower chest: The heart is slightly enlarged. There is no significant pericardial effusion. Elevated right hemidiaphragm is again shown. There are trace pleural effusions again noted as well as heterogeneous patchy ground-glass opacities in the left upper  lobe visualized portion with underlying interstitial thickening, similar opacities interval increased in the medial right middle lobe and to a lesser extent in the lower lobes where the opacities are intermixed with coarse atelectatic markings. Consider multifocal pneumonia including viral and atypical etiologies. Hepatobiliary: The liver is moderately steatotic without mass enhancement. No significant liver enlargement. The gallbladder and bile ducts are unremarkable. Pancreas: Partially atrophic, otherwise unremarkable. Spleen: No abnormality. Adrenals/Urinary Tract: Adrenal glands are unremarkable. Kidneys are normal, without renal calculi, focal lesion, or hydronephrosis. Bladder is unremarkable. Stomach/Bowel: No dilatation or wall thickening including the retrocecal appendix. Uncomplicated sigmoid diverticulosis. Normal small stool burden. Vascular/Lymphatic: Aortic atherosclerosis. No enlarged abdominal or pelvic lymph nodes. Reproductive: The uterus is either small and atrophic or the patient could have had a supracervical hysterectomy. The ovaries are not enlarged. There is a small calcified fibroid in the right wall of the uterus or cervix remnant. There are metallic structures along either side of the vaginal cuff which could be surgical clips or fiducial markers. There is no overt thickening of the vaginal cuff. Other: Small umbilical fat hernia. No incarcerated hernia. No free fluid, free hemorrhage or free air, or acute inflammatory changes. Musculoskeletal: Mild degenerative change of the spine. No destructive bone lesions. IMPRESSION: 1. No acute findings or CT evidence of metastatic disease in the abdomen or pelvis. 2. Moderate hepatic steatosis. 3. Aortic atherosclerosis. 4. Diverticulosis without evidence of diverticulitis. 5. Trace pleural effusions with heterogeneous ground-glass opacities and interstitial thickening in the visualized lung fields. Consider multifocal pneumonia including viral  and atypical etiologies. 6. Small umbilical fat hernia. 7. Metallic structures along either side of the vaginal cuff which could be surgical clips or fiducial markers. There is no overt thickening of the vaginal cuff. Aortic Atherosclerosis (ICD10-I70.0). Electronically Signed   By: Almira Bar M.D.   On: 12/09/2022 20:26   US Venous Img Lower Bilateral (DVT)  Result Date: 12/09/2022 CLINICAL DATA:  77 year old female with history of bilateral leg weakness. EXAM: BILATERAL LOWER EXTREMITY VENOUS DOPPLER ULTRASOUND TECHNIQUE: Gray-scale sonography with graded compression, as well as color Doppler and duplex ultrasound were performed to evaluate the lower extremity deep venous systems from the level of the common femoral vein and including the common femoral, femoral, profunda femoral, popliteal and calf veins including the posterior tibial, peroneal and gastrocnemius veins when visible. The superficial great saphenous vein was also interrogated. Spectral Doppler was utilized to evaluate flow at rest and with distal augmentation maneuvers in the common femoral, femoral and popliteal  veins. COMPARISON:  None Available. FINDINGS: RIGHT LOWER EXTREMITY Common Femoral Vein: No evidence of thrombus. Normal compressibility, respiratory phasicity and response to augmentation. Saphenofemoral Junction: No evidence of thrombus. Normal compressibility and flow on color Doppler imaging. Profunda Femoral Vein: No evidence of thrombus. Normal compressibility and flow on color Doppler imaging. Femoral Vein: No evidence of thrombus. Normal compressibility, respiratory phasicity and response to augmentation. Popliteal Vein: No evidence of thrombus. Normal compressibility, respiratory phasicity and response to augmentation. Calf Veins: No evidence of thrombus. Normal compressibility and flow on color Doppler imaging. Superficial Great Saphenous Vein: No evidence of thrombus. Normal compressibility. Venous Reflux:  None. Other  Findings: In the popliteal fossa there is a 1.5 x 0.6 x 1.9 cm heterogeneously hypoechoic lesion with increased through transmission, ovoid in shape, without internal blood flow, presumably a Baker's cyst. LEFT LOWER EXTREMITY Common Femoral Vein: No evidence of thrombus. Normal compressibility, respiratory phasicity and response to augmentation. Saphenofemoral Junction: No evidence of thrombus. Normal compressibility and flow on color Doppler imaging. Profunda Femoral Vein: No evidence of thrombus. Normal compressibility and flow on color Doppler imaging. Femoral Vein: No evidence of thrombus. Normal compressibility, respiratory phasicity and response to augmentation. Popliteal Vein: No evidence of thrombus. Normal compressibility, respiratory phasicity and response to augmentation. Calf Veins: No evidence of thrombus. Normal compressibility and flow on color Doppler imaging. Superficial Great Saphenous Vein: No evidence of thrombus. Normal compressibility. Venous Reflux:  None. Other Findings:  None. IMPRESSION: 1. No evidence of deep venous thrombosis in either lower extremity. 2. Small right-sided Baker's cyst incidentally noted, as above. Electronically Signed   By: Trudie Reed M.D.   On: 12/09/2022 05:37   MR THORACIC SPINE W WO CONTRAST  Result Date: 12/09/2022 CLINICAL DATA:  Ataxia, concern for metastatic disease EXAM: MRI THORACIC AND LUMBAR SPINE WITHOUT AND WITH CONTRAST TECHNIQUE: Multiplanar and multiecho pulse sequences of the thoracic and lumbar spine were obtained without and with intravenous contrast. CONTRAST:  8mL GADAVIST GADOBUTROL 1 MMOL/ML IV SOLN COMPARISON:  No prior MRI of the thoracic or lumbar spine available. FINDINGS: MRI THORACIC SPINE FINDINGS Alignment: Preservation of the normal thoracic kyphosis. No listhesis. Mild S-shaped curvature of the thoracolumbar spine. Vertebrae: No acute fracture or evidence of discitis. T2 hyperintense, contrast-enhancing lesions in the right  posterior elements at T6 (series 13, image 5 and series 8, image 5), and T10 (series 13, image 6 and series 8, image 6) could represent metastatic lesions in the T5 right pedicle and transverse process and T10 right facet. A T1 isointense, mildly T2 hyperintense, and minimally enhancing lesion in the T10 vertebral body is nonspecific and could represent a suspicious lesion or an atypical hemangioma. Cord:  Normal signal and morphology.  No abnormal enhancement. Paraspinal and other soft tissues: Trace bilateral pleural effusions. Airspace opacities are better visualized on 12/08/2022 CTA chest. Disc levels: Mild degenerative changes without significant spinal canal stenosis or neural foraminal narrowing. MRI LUMBAR SPINE FINDINGS Segmentation:  5 lumbar type vertebral bodies. Alignment: No significant listhesis. Preservation of the normal thoracic lordosis. Mild levocurvature. Vertebrae: No acute fracture or evidence of discitis. 5 mm focus of increased T2 signal and enhancement in the L1 vertebral body (series 6, image 11), which is nonspecific but could represent a metastatic lesion Conus medullaris: Extends to the L1-L2 level and appears normal. No abnormal enhancement. Paraspinal and other soft tissues: Atrophy of the inferior paraspinous musculature. No lymphadenopathy. Disc levels: T12-L1: No significant disc bulge. No spinal canal stenosis or neural foraminal narrowing.  L1-L2: No significant disc bulge. No spinal canal stenosis or neural foraminal narrowing. L2-L3: No significant disc bulge. No spinal canal stenosis or neural foraminal narrowing. L3-L4: Left foraminal and extreme lateral disc protrusion. No spinal canal stenosis or neural foraminal narrowing. L4-L5: Mild disc bulge. Mild facet arthropathy. Narrowing of the lateral recesses. No spinal canal stenosis. Mild bilateral neural foraminal narrowing. L5-S1: Minimal disc bulge with left subarticular and foraminal protrusion. No spinal canal stenosis.  Mild left neural foraminal narrowing. IMPRESSION: 1. Small enhancing lesions in the right posterior elements at T6 and T10, as well as possibly at L1, which could represent metastatic disease. A lesion in the T10 vertebral body has a slightly different signal characteristics and could represent a suspicious lesion or an atypical hemangioma. 2. No spinal canal stenosis or neural foraminal narrowing in the thoracic spine. 3. L4-L5 mild bilateral neural foraminal narrowing. Narrowing of the lateral recesses at this level could affect the descending L5 nerve roots. 4. L5-S1 mild left neural foraminal narrowing. Electronically Signed   By: Wiliam Ke M.D.   On: 12/09/2022 03:41   MR Lumbar Spine W Wo Contrast  Result Date: 12/09/2022 CLINICAL DATA:  Ataxia, concern for metastatic disease EXAM: MRI THORACIC AND LUMBAR SPINE WITHOUT AND WITH CONTRAST TECHNIQUE: Multiplanar and multiecho pulse sequences of the thoracic and lumbar spine were obtained without and with intravenous contrast. CONTRAST:  8mL GADAVIST GADOBUTROL 1 MMOL/ML IV SOLN COMPARISON:  No prior MRI of the thoracic or lumbar spine available. FINDINGS: MRI THORACIC SPINE FINDINGS Alignment: Preservation of the normal thoracic kyphosis. No listhesis. Mild S-shaped curvature of the thoracolumbar spine. Vertebrae: No acute fracture or evidence of discitis. T2 hyperintense, contrast-enhancing lesions in the right posterior elements at T6 (series 13, image 5 and series 8, image 5), and T10 (series 13, image 6 and series 8, image 6) could represent metastatic lesions in the T5 right pedicle and transverse process and T10 right facet. A T1 isointense, mildly T2 hyperintense, and minimally enhancing lesion in the T10 vertebral body is nonspecific and could represent a suspicious lesion or an atypical hemangioma. Cord:  Normal signal and morphology.  No abnormal enhancement. Paraspinal and other soft tissues: Trace bilateral pleural effusions. Airspace  opacities are better visualized on 12/08/2022 CTA chest. Disc levels: Mild degenerative changes without significant spinal canal stenosis or neural foraminal narrowing. MRI LUMBAR SPINE FINDINGS Segmentation:  5 lumbar type vertebral bodies. Alignment: No significant listhesis. Preservation of the normal thoracic lordosis. Mild levocurvature. Vertebrae: No acute fracture or evidence of discitis. 5 mm focus of increased T2 signal and enhancement in the L1 vertebral body (series 6, image 11), which is nonspecific but could represent a metastatic lesion Conus medullaris: Extends to the L1-L2 level and appears normal. No abnormal enhancement. Paraspinal and other soft tissues: Atrophy of the inferior paraspinous musculature. No lymphadenopathy. Disc levels: T12-L1: No significant disc bulge. No spinal canal stenosis or neural foraminal narrowing. L1-L2: No significant disc bulge. No spinal canal stenosis or neural foraminal narrowing. L2-L3: No significant disc bulge. No spinal canal stenosis or neural foraminal narrowing. L3-L4: Left foraminal and extreme lateral disc protrusion. No spinal canal stenosis or neural foraminal narrowing. L4-L5: Mild disc bulge. Mild facet arthropathy. Narrowing of the lateral recesses. No spinal canal stenosis. Mild bilateral neural foraminal narrowing. L5-S1: Minimal disc bulge with left subarticular and foraminal protrusion. No spinal canal stenosis. Mild left neural foraminal narrowing. IMPRESSION: 1. Small enhancing lesions in the right posterior elements at T6 and T10, as well  as possibly at L1, which could represent metastatic disease. A lesion in the T10 vertebral body has a slightly different signal characteristics and could represent a suspicious lesion or an atypical hemangioma. 2. No spinal canal stenosis or neural foraminal narrowing in the thoracic spine. 3. L4-L5 mild bilateral neural foraminal narrowing. Narrowing of the lateral recesses at this level could affect the  descending L5 nerve roots. 4. L5-S1 mild left neural foraminal narrowing. Electronically Signed   By: Wiliam Ke M.D.   On: 12/09/2022 03:41   CT Angio Chest PE W and/or Wo Contrast  Result Date: 12/08/2022 CLINICAL DATA:  High probability for PE EXAM: CT ANGIOGRAPHY CHEST WITH CONTRAST TECHNIQUE: Multidetector CT imaging of the chest was performed using the standard protocol during bolus administration of intravenous contrast. Multiplanar CT image reconstructions and MIPs were obtained to evaluate the vascular anatomy. RADIATION DOSE REDUCTION: This exam was performed according to the departmental dose-optimization program which includes automated exposure control, adjustment of the mA and/or kV according to patient size and/or use of iterative reconstruction technique. CONTRAST:  75mL OMNIPAQUE IOHEXOL 350 MG/ML SOLN COMPARISON:  None Available. FINDINGS: Cardiovascular: The heart is enlarged. There is no pericardial effusion. The aorta is normal in size. There are atherosclerotic calcifications of the aorta. There is adequate opacification of the pulmonary arteries to the segmental level. There is no evidence for pulmonary embolism. Mediastinum/Nodes: There are diffuse nonenlarged mediastinal and hilar lymph nodes. Visualized esophagus and thyroid gland are within normal limits. Lungs/Pleura: There are multifocal interstitial, airspace and ground-glass patchy opacities predominantly in the bilateral upper lobes but also minimally in the left lower lobe. There are trace bilateral pleural effusions. There is no pneumothorax. Trachea and central airways are patent. Upper Abdomen: Heterogeneous hypodensity throughout the right lobe of the liver is incompletely characterized on this study. Musculoskeletal: No chest wall abnormality. No acute or significant osseous findings. Review of the MIP images confirms the above findings. IMPRESSION: 1. No evidence for pulmonary embolism. 2. Multifocal interstitial,  airspace and ground-glass opacities predominantly in the bilateral upper lobes but also minimally in the left lower lobe. Findings are concerning for multifocal pneumonia. COVID pneumonia can have this appearance. 3. Trace bilateral pleural effusions. 4. Cardiomegaly. 5. Heterogeneous hypodensity throughout the right lobe of the liver is incompletely characterized on this study. Differential diagnosis includes metastatic disease or infection. Recommend further evaluation with dedicated CT or MRI of the abdomen with contrast. Aortic Atherosclerosis (ICD10-I70.0). Electronically Signed   By: Darliss Cheney M.D.   On: 12/08/2022 23:15   CT HEAD WO CONTRAST ( )  Result Date: 12/08/2022 CLINICAL DATA:  Mental status change, unknown cause EXAM: CT HEAD WITHOUT CONTRAST TECHNIQUE: Contiguous axial images were obtained from the base of the skull through the vertex without intravenous contrast. RADIATION DOSE REDUCTION: This exam was performed according to the departmental dose-optimization program which includes automated exposure control, adjustment of the mA and/or kV according to patient size and/or use of iterative reconstruction technique. COMPARISON:  05/11/2020, MRI 12/04/2022 FINDINGS: Brain: 2.2 cm extra-axial low-density meningioma again seen along the right side of the falx, unchanged in size since recent MRI. Overlying adjacent brain edema, stable. No acute infarct, hemorrhage or hydrocephalus. Vascular: No hyperdense vessel or unexpected calcification. Skull: No acute calvarial abnormality. Sinuses/Orbits: No acute findings Other: None IMPRESSION: Stable meningioma along the high right falx with mild adjacent brain edema, unchanged since recent study. No acute intracranial abnormality. Electronically Signed   By: Charlett Nose M.D.   On: 12/08/2022  22:36   DG Chest 2 View  Result Date: 12/08/2022 CLINICAL DATA:  Weakness. EXAM: CHEST - 2 VIEW COMPARISON:  Chest radiograph dated 05/11/2020. FINDINGS:  Bilateral streaky interstitial densities, left greater than right, most consistent with developing infiltrate. No pleural effusion or pneumothorax. The cardiac silhouette is within normal limits. Atherosclerotic calcification of the aortic arch. No acute osseous pathology. IMPRESSION: Developing bilateral infiltrates. Electronically Signed   By: Elgie Collard M.D.   On: 12/08/2022 18:08   MR Brain W and Wo Contrast  Result Date: 12/04/2022 CLINICAL DATA:  Motor neuron disease suspected EXAM: MRI HEAD WITHOUT AND WITH CONTRAST TECHNIQUE: Multiplanar, multiecho pulse sequences of the brain and surrounding structures were obtained without and with intravenous contrast. CONTRAST:  7.27mL GADAVIST GADOBUTROL 1 MMOL/ML IV SOLN COMPARISON:  MRI head April 22, 2021. FINDINGS: Brain: Homogeneously enhancing 2.1 x 1.5 cm dural based enhancing mass along the high right falx (series 16, image 126), probably a meningioma. There is mild adjacent brain edema and mass effect. The mass is inferior to the superior sagittal sinus. No other pathologic enhancement. No evidence of acute infarct, acute hemorrhage, midline shift or hydrocephalus. Vascular: Major arterial flow voids are maintained at the skull base. Skull and upper cervical spine: Normal marrow signal. Sinuses/Orbits: Clear sinuses.  No acute orbital findings. Other: No mastoid effusions. IMPRESSION: Similar Putative 2.1 cm meningioma along the high right falx. Mild adjacent brain edema and mass effect is similar. Electronically Signed   By: Feliberto Harts M.D.   On: 12/04/2022 14:33    ASSESSMENT: Meningioma, now with increasing lower extremity weakness.  PLAN:    Meningioma: No biopsy was taken, but patient initiated XRT secondary to increased growth of lesion and headache.  She completed her treatments on November 23, 2022.  Appreciate neurosurgical input.  MRI results from December 04, 2022 reviewed independently and reported as above with no significant change  in meningioma. Vertebral lesions: MRI of the thoracic and lumbar spine revealed contrast-enhancing lesions at T6 and T10, but these are too small to characterize or biopsy.  There unlikely related to her lower extremity weakness and do not appear to be metastatic lesions.  Will get a PET scan upon discharge to further evaluate. Lower extremity weakness: Unclear etiology.  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  Patient is followed by [facility] neurology.  She may require rehab placement upon discharge for intense physical therapy. Anemia: Patient's hemoglobin has trended down to 10.1, monitor. Thrombocytopenia: Mild, monitor.  Patient's platelet count is 136.  Appreciate consult, will follow.  Jeralyn Ruths, MD   12/10/2022 5:47 PM

## 2022-12-10 NOTE — Progress Notes (Signed)
Physical Therapy Treatment Patient Details Name: Vickie Stewart MRN: [id number] DOB: 06-11-1945 Today's Date: 12/10/2022   History of Present Illness Pt is a 77 y/o F admitted on 12/08/22 after presenting with c/o BLE weakness. MRI showed "small enhancing lesions in the right posterior elements at T6 & T10, as well as possibly at L1." PMH: meningioma of the right parafalcine region s/p IMRT, HTN, HLD, anxiety, glaucoma, obesity, vaginal CA, thoracic or lumbosacral neuritis or radiculitis    PT Comments  Pt in bed, agreeable to session. AMB to BR, assist with pericare/costume change, then AMB in room to 61ft. While AMB on RA, desaturation to 83% SpO2, limiting dyspnea. Pt able to practice sequential STS transfers, but still still requires elevated bed height and heavy use of BUE. Will continue to follow.     Assistance Recommended at Discharge Intermittent Supervision/Assistance  If plan is discharge home, recommend the following:  Can travel by private vehicle    A little help with walking and/or transfers;A little help with bathing/dressing/bathroom;Assistance with cooking/housework;Help with stairs or ramp for entrance;Assist for transportation   Yes  Equipment Recommendations  Rolling walker (2 wheels)    Recommendations for Other Services       Precautions / Restrictions Precautions Precautions: Fall Restrictions Weight Bearing Restrictions: No     Mobility  Bed Mobility Overal bed mobility: Needs Assistance Bed Mobility: Supine to Sit, Sit to Supine     Supine to sit: Min assist, HOB elevated          Transfers Overall transfer level: Needs assistance Equipment used: Rolling walker (2 wheels) Transfers: Sit to/from Stand Sit to Stand: Min assist           General transfer comment: can perform modI from elevated bed height    Ambulation/Gait Ambulation/Gait assistance: Min guard Gait Distance (Feet): 80 Feet               Stairs              Wheelchair Mobility     Tilt Bed    Modified Rankin (Stroke Patients Only)       Balance                                            Cognition Arousal/Alertness: Awake/alert Behavior During Therapy: WFL for tasks assessed/performed Overall Cognitive Status: Within Functional Limits for tasks assessed                                 General Comments: Presented with fear of falling and increased dependency from prior admission        Exercises Other Exercises Other Exercises: STS from elevated EOB, minguard to supervision, vues for technique x3    General Comments        Pertinent Vitals/Pain Pain Assessment Pain Assessment: No/denies pain Pain Score: 7  Faces Pain Scale: Hurts little more Pain Location: back Pain Descriptors / Indicators: Discomfort Pain Intervention(s): Limited activity within patient's tolerance, Monitored during session    Home Living                          Prior Function            PT Goals (current goals can now be found in the care plan  section) Acute Rehab PT Goals Patient Stated Goal: to be able to walk before she goes home PT Goal Formulation: With patient Time For Goal Achievement: 12/23/22 Potential to Achieve Goals: Good Progress towards PT goals: Progressing toward goals    Frequency    Min 1X/week      PT Plan Current plan remains appropriate    Co-evaluation              AM-PAC PT "6 Clicks" Mobility   Outcome Measure  Help needed turning from your back to your side while in a flat bed without using bedrails?: A Lot Help needed moving from lying on your back to sitting on the side of a flat bed without using bedrails?: A Lot Help needed moving to and from a bed to a chair (including a wheelchair)?: A Lot Help needed standing up from a chair using your arms (e.g., wheelchair or bedside chair)?: A Lot Help needed to walk in hospital room?: A Little Help  needed climbing 3-5 steps with a railing? : A Little 6 Click Score: 14    End of Session Equipment Utilized During Treatment: Oxygen Activity Tolerance: Patient tolerated treatment well Patient left: in chair;with chair alarm set;with call bell/phone within reach Nurse Communication: Mobility status PT Visit Diagnosis: Unsteadiness on feet (R26.81);Other abnormalities of gait and mobility (R26.89);Muscle weakness (generalized) (M62.81);Difficulty in walking, not elsewhere classified (R26.2);Pain     Time: 4098-1191 PT Time Calculation (min) (ACUTE ONLY): 27 min  Charges:    $Therapeutic Activity: 23-37 mins PT General Charges $$ ACUTE PT VISIT: 1 Visit                    11:49 AM, 12/10/22 Rosamaria Lints, PT, DPT Physical Therapist - [facility]  [phone] (ASCOM)     , C 12/10/2022, 11:44 AM

## 2022-12-10 NOTE — Plan of Care (Signed)
  Problem: Fluid Volume: Goal: Hemodynamic stability will improve Outcome: Not Progressing   Problem: Clinical Measurements: Goal: Diagnostic test results will improve Outcome: Not Progressing Goal: Signs and symptoms of infection will decrease Outcome: Not Progressing   Problem: Respiratory: Goal: Ability to maintain adequate ventilation will improve Outcome: Not Progressing   Problem: Education: Goal: Ability to describe self-care measures that may prevent or decrease complications (Diabetes Survival Skills Education) will improve Outcome: Not Progressing Goal: Individualized Educational Video(s) Outcome: Not Progressing   Problem: Coping: Goal: Ability to adjust to condition or change in health will improve Outcome: Not Progressing   Problem: Fluid Volume: Goal: Ability to maintain a balanced intake and output will improve Outcome: Not Progressing   Problem: Health Behavior/Discharge Planning: Goal: Ability to identify and utilize available resources and services will improve Outcome: Not Progressing Goal: Ability to manage health-related needs will improve Outcome: Not Progressing   Problem: Metabolic: Goal: Ability to maintain appropriate glucose levels will improve Outcome: Not Progressing   Problem: Nutritional: Goal: Maintenance of adequate nutrition will improve Outcome: Not Progressing Goal: Progress toward achieving an optimal weight will improve Outcome: Not Progressing   Problem: Skin Integrity: Goal: Risk for impaired skin integrity will decrease Outcome: Not Progressing   Problem: Tissue Perfusion: Goal: Adequacy of tissue perfusion will improve Outcome: Not Progressing   Problem: Education: Goal: Knowledge of General Education information will improve Description: Including pain rating scale, medication(s)/side effects and non-pharmacologic comfort measures Outcome: Not Progressing   Problem: Health Behavior/Discharge Planning: Goal: Ability  to manage health-related needs will improve Outcome: Not Progressing   Problem: Clinical Measurements: Goal: Ability to maintain clinical measurements within normal limits will improve Outcome: Not Progressing Goal: Will remain free from infection Outcome: Not Progressing Goal: Diagnostic test results will improve Outcome: Not Progressing Goal: Respiratory complications will improve Outcome: Not Progressing Goal: Cardiovascular complication will be avoided Outcome: Not Progressing   Problem: Activity: Goal: Risk for activity intolerance will decrease Outcome: Not Progressing   Problem: Nutrition: Goal: Adequate nutrition will be maintained Outcome: Not Progressing   Problem: Coping: Goal: Level of anxiety will decrease Outcome: Not Progressing   Problem: Elimination: Goal: Will not experience complications related to bowel motility Outcome: Not Progressing Goal: Will not experience complications related to urinary retention Outcome: Not Progressing   Problem: Pain Managment: Goal: General experience of comfort will improve Outcome: Not Progressing   Problem: Safety: Goal: Ability to remain free from injury will improve Outcome: Not Progressing   Problem: Skin Integrity: Goal: Risk for impaired skin integrity will decrease Outcome: Not Progressing   

## 2022-12-11 ENCOUNTER — Other Ambulatory Visit: Payer: Self-pay

## 2022-12-11 ENCOUNTER — Telehealth: Payer: Self-pay | Admitting: Neurosurgery

## 2022-12-11 ENCOUNTER — Inpatient Hospital Stay: Payer: Medicare PPO | Admitting: Family Medicine

## 2022-12-11 DIAGNOSIS — R262 Difficulty in walking, not elsewhere classified: Secondary | ICD-10-CM

## 2022-12-11 DIAGNOSIS — R29898 Other symptoms and signs involving the musculoskeletal system: Secondary | ICD-10-CM

## 2022-12-11 DIAGNOSIS — Z86018 Personal history of other benign neoplasm: Secondary | ICD-10-CM

## 2022-12-11 DIAGNOSIS — Z09 Encounter for follow-up examination after completed treatment for conditions other than malignant neoplasm: Secondary | ICD-10-CM

## 2022-12-11 LAB — FUNGITELL BETA-D-GLUCAN: Result Name:: POSITIVE — AB

## 2022-12-11 LAB — LEGIONELLA PNEUMOPHILA SEROGP 1 UR AG: L. pneumophila Serogp 1 Ur Ag: NEGATIVE

## 2022-12-11 LAB — GLUCOSE, CAPILLARY
Glucose-Capillary: 103 mg/dL — ABNORMAL HIGH (ref 70–99)
Glucose-Capillary: 145 mg/dL — ABNORMAL HIGH (ref 70–99)
Glucose-Capillary: 77 mg/dL (ref 70–99)
Glucose-Capillary: 88 mg/dL (ref 70–99)

## 2022-12-11 NOTE — Progress Notes (Signed)
RE: Vickie Stewart Date of Birth: [birth year] Date: 12/11/2022     To Whom It May Concern:   Please be advised that the above-named patient will require a short-term nursing home stay - anticipated 30 days or less for rehabilitation and strengthening.  The plan is for return home

## 2022-12-11 NOTE — Progress Notes (Signed)
Occupational Therapy Treatment Patient Details Name: Vickie Stewart MRN: [id number] DOB: 1945/08/26 Today's Date: 12/11/2022   History of present illness Pt is a 77 y/o F admitted on 12/08/22 after presenting with c/o BLE weakness. MRI showed "small enhancing lesions in the right posterior elements at T6 & T10, as well as possibly at L1." PMH: meningioma of the right parafalcine region s/p IMRT, HTN, HLD, anxiety, glaucoma, obesity, vaginal CA, thoracic or lumbosacral neuritis or radiculitis   OT comments  Pt seen for OT tx. Pt sleeping, wakes easily to OT's voice and while pt notes she is too fatigued to complete EOB/OOB ADL tasks, she is agreeable to education. Pt instructed in ECS with emphasis on activity pacing, importance of rest breaks, home/routines modifications, AE/DME, and falls prevention.  OT facilitated problem solving for bathing and DME for shower to improve safety. Handout provided to support recall and carryover. Pt continues to benefit from skilled OT Services.    Recommendations for follow up therapy are one component of a multi-disciplinary discharge planning process, led by the attending physician.  Recommendations may be updated based on patient status, additional functional criteria and insurance authorization.    Assistance Recommended at Discharge Intermittent Supervision/Assistance  Patient can return home with the following  A little help with walking and/or transfers;A little help with bathing/dressing/bathroom;Assistance with cooking/housework;Assist for transportation;Help with stairs or ramp for entrance   Equipment Recommendations  Other (comment) (defer to next venue)    Recommendations for Other Services      Precautions / Restrictions Precautions Precautions: Fall Restrictions Weight Bearing Restrictions: No       Mobility Bed Mobility               General bed mobility comments: pt declined    Transfers                          Balance                                           ADL either performed or assessed with clinical judgement   ADL                                              Extremity/Trunk Assessment              Vision       Perception     Praxis      Cognition Arousal/Alertness: Awake/alert Behavior During Therapy: WFL for tasks assessed/performed Overall Cognitive Status: Within Functional Limits for tasks assessed                                          Exercises Other Exercises Other Exercises: Pt agreeable to education in ECS with emphasis on activity pacing, importance of rest breaks, home/routines modifications, AE/DME, and falls prevention. Handout provided to support recall and carryover.    Shoulder Instructions       General Comments Pt on 2L/min via nasal cannula with SPO2 93-94%. Pt with incontinent & continent BM, requiring assistance for thorough peri hygiene.    Pertinent Vitals/ Pain  Pain Assessment Pain Assessment: No/denies pain  Home Living                                          Prior Functioning/Environment              Frequency  Min 1X/week        Progress Toward Goals  OT Goals(current goals can now be found in the care plan section)  Progress towards OT goals: Progressing toward goals  Acute Rehab OT Goals Patient Stated Goal: go to rehab OT Goal Formulation: With patient Time For Goal Achievement: 12/23/22 Potential to Achieve Goals: Good  Plan Discharge plan remains appropriate;Frequency remains appropriate    Co-evaluation                 AM-PAC OT "6 Clicks" Daily Activity     Outcome Measure   Help from another person eating meals?: None Help from another person taking care of personal grooming?: A Little Help from another person toileting, which includes using toliet, bedpan, or urinal?: A Little Help from another person bathing  (including washing, rinsing, drying)?: A Little Help from another person to put on and taking off regular upper body clothing?: None Help from another person to put on and taking off regular lower body clothing?: A Little 6 Click Score: 20    End of Session    OT Visit Diagnosis: Other abnormalities of gait and mobility (R26.89)   Activity Tolerance Patient tolerated treatment well;Patient limited by fatigue   Patient Left in bed;with call bell/phone within reach;with bed alarm set   Nurse Communication          Time: 2956-2130 OT Time Calculation (min): 11 min  Charges: OT General Charges $OT Visit: 1 Visit OT Treatments $Self Care/Home Management : 8-22 mins  Arman Filter., MPH, MS, OTR/L ascom [phone] 12/11/22, 2:56 PM

## 2022-12-11 NOTE — Plan of Care (Signed)
  Problem: Skin Integrity: Goal: Risk for impaired skin integrity will decrease Outcome: Progressing   Problem: Safety: Goal: Ability to remain free from injury will improve Outcome: Progressing   Problem: Skin Integrity: Goal: Risk for impaired skin integrity will decrease Outcome: Progressing   

## 2022-12-11 NOTE — Progress Notes (Signed)
PROGRESS NOTE    Vickie Stewart  RUE:[id number] DOB: Jul 24, 1945 DOA: 12/08/2022 PCP: Alba Cory, MD    Brief Narrative:  77 y.o. female with medical history significant for meningioma of the right parafalcine region s/p IMRT, hypertension, hyperlipidemia, reflux esophagitis,  Coming to [place] bilateral leg weakness.  It originally started on the 7/5 and patient had an MRI of the brain with and without contrast.  Patient was recently discharged on 6 July.  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  Patient is no longer on steroids per discharge summary.  Per PT note and chart review patient needs A little help with walking and/or transfers;Help with stairs or ramp for entrance;Assist for transportation.  Patient today also complains of generalized weakness.  States that she lives with her brother who is her healthcare power of attorney and has been helping at home.  Patient is a limited historian otherwise she does not report any shortness of breath but is found to be hypoxic with O2 sats of 89% in no distress.  Initial vitals also show blood pressure of 107/57 heart rate of 99 respirations of 18.  CTA chest showed bilateral infiltrates no pulmonary embolism.  Exam is otherwise normal except that patient is extremely weak.  Procalcitonin is negative.  Normal white count.   Etiology of current presentation is unclear.  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  Also previous admission considering nutritional deficiencies resulting in weakness and decreased ability to ambulate.  I have engaged neurosurgery for recommendations.  Chest CT findings of unclear etiology.  Demonstrating bilateral infiltrates however no other clinical signs of infection.  Normal white count, no fevers.   Assessment & Plan:   Principal Problem:   Lower extremity weakness Active Problems:   Meningioma (HCC)   Bilateral leg weakness   Thrombocytopenia (HCC)    Essential hypertension, benign   OSA (obstructive sleep apnea)   Pre-diabetes   Gastroesophageal reflux disease without esophagitis   PNA (pneumonia)   Acute respiratory failure with hypoxia (HCC)   Inability to ambulate due to multiple joints  Bilateral lower extremity weakness Unclear etiology.  History of leg weakness progressive over 6 weeks.  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  MRI lumbar and thoracic spine with evidence of lucencies concerning for possible metastatic disease. Seen in consultation by neurosurgery.  No neurosurgical intervention warranted.  Seen on previous admission by neurology.  No neurologic etiology.  Suspect nutritional etiology.  Patient eating less than 25% of her meals. Plan: Continue out of bed exercises.  PT OT as tolerated.  Ambulate frequently.  Optimize nutritional status.  Will need skilled nursing facility.  TOC engaged.  Medically stable for discharge.  Meningioma Lumbar spinal lucency Patient follows with [facility] neurology.  Receiving RT with Dr. Aggie Cosier. Inpatient oncology consulted.  Plan for outpatient PET scan for better characterization.  Discussed with IR.  Lesions not amenable to image guided biopsy.  Oncology aware.  Will arrange for outpatient PET  Liver lesions, ruled out CT abdomen pelvis did not demonstrate any liver lesions  Thrombocytopenia Appears chronic and asymptomatic  Essential hypertension Blood pressure soft.  Home diltiazem and Benicar on hold  Obstructive sleep apnea Goes for CPAP  Acute respiratory failure with hypoxia Ruled out Patient on room air  Possible pneumonia, ruled out Unclear etiology.  Findings on CT suspicious for possible COVID Procalcitonin negative.  No fevers.  No elevated white count COVID swab negative Suspicion low for  bacterial infection RVP negative Plan: Monitor off antibiotics Strep pneumo urinary antigen negative Follow-up Legionella and Fungitell,  pending Check strep pneumo, Legionella, Fungitell: All pending Check respiratory viral panel, negative Consider outpatient pulmonary evaluation  GERD PPI    DVT prophylaxis: SQ heparin Code Status: Full Family Communication:None today Disposition Plan: Status is: Inpatient Remains inpatient appropriate because: Unsafe discharge plan.  Will need skilled nursing facility.     Level of care: Med-Surg  Consultants:  Neurosurgery  Procedures:  None  Antimicrobials:   Subjective: Seen and examined.  Resting in bed.  No visible distress.  No complaints of pain.  Objective: Vitals:   12/10/22 0733 12/10/22 1509 12/10/22 2333 12/11/22 0728  BP: (!) 97/54 (!) 99/53 104/60 (!) 113/59  Pulse: 84 87 82 73  Resp: 18 16 17 15   Temp: 98 F (36.7 C) 98.9 F (37.2 C) (!) 97.5 F (36.4 C) 97.8 F (36.6 C)  TempSrc:      SpO2: 98% 99% 99% 99%  Weight:      Height:       No intake or output data in the 24 hours ending 12/11/22 1311  Filed Weights   12/08/22 1726  Weight: 82.6 kg    Examination:  General exam: No acute distress Respiratory system: Scattered rhonchi.  Normal work of breathing.  Room air Cardiovascular system: S1-S2, RRR, no murmurs, no pedal edema Gastrointestinal system: Soft, NT/ND, normal bowel sounds Central nervous system: Alert and oriented. No focal neurological deficits. Extremities: Decreased power bilateral lower extremities.  Gait not assessed Skin: No rashes, lesions or ulcers Psychiatry: Judgement and insight appear normal. Mood & affect appropriate.     Data Reviewed: I have personally reviewed following labs and imaging studies  CBC: Recent Labs  Lab 12/05/22 0615 12/08/22 1733 12/09/22 0213  WBC 5.0 5.7 5.4  HGB 12.7 11.8* 10.1*  HCT 37.2 34.8* 30.7*  MCV 86.9 87.4 89.5  PLT 98* 136* 136*   Basic Metabolic Panel: Recent Labs  Lab 12/05/22 0615 12/08/22 1733 12/09/22 0213 12/10/22 0517  NA 135 137  --   --   K 3.2*  3.7  --   --   CL 103 103  --   --   CO2 23 23  --   --   GLUCOSE 189* 112*  --   --   BUN 10 12  --   --   CREATININE 0.51 0.63 0.65 0.52  CALCIUM 8.0* 8.3*  --   --    GFR: Estimated Creatinine Clearance: 60.5 mL/min (by C-G formula based on SCr of 0.52 mg/dL). Liver Function Tests: Recent Labs  Lab 12/08/22 1733  AST 38  ALT 39  ALKPHOS 58  BILITOT 0.7  PROT 5.8*  ALBUMIN 2.5*   No results for input(s): "LIPASE", "AMYLASE" in the last 168 hours. No results for input(s): "AMMONIA" in the last 168 hours. Coagulation Profile: Recent Labs  Lab 12/09/22 0653  INR 1.2   Cardiac Enzymes: Recent Labs  Lab 12/08/22 1733  CKTOTAL 39   BNP (last 3 results) No results for input(s): "PROBNP" in the last 8760 hours. HbA1C: No results for input(s): "HGBA1C" in the last 72 hours. CBG: Recent Labs  Lab 12/10/22 1143 12/10/22 1657 12/10/22 2127 12/11/22 0727 12/11/22 1157  GLUCAP 78 130* 106* 103* 145*   Lipid Profile: No results for input(s): "CHOL", "HDL", "LDLCALC", "TRIG", "CHOLHDL", "LDLDIRECT" in the last 72 hours. Thyroid Function Tests: Recent Labs    12/09/22 0213  TSH 0.743  Anemia Panel: No results for input(s): "VITAMINB12", "FOLATE", "FERRITIN", "TIBC", "IRON", "RETICCTPCT" in the last 72 hours. Sepsis Labs: Recent Labs  Lab 12/08/22 1733 12/09/22 0213 12/09/22 0653  PROCALCITON 0.20  --   --   LATICACIDVEN  --  0.9 1.4    Recent Results (from the past 240 hour(s))  Blood culture (single)     Status: None (Preliminary result)   Collection Time: 12/09/22 12:18 AM   Specimen: BLOOD  Result Value Ref Range Status   Specimen Description BLOOD BLOOD RIGHT ARM  Final   Special Requests   Final    BOTTLES DRAWN AEROBIC AND ANAEROBIC Blood Culture results may not be optimal due to an excessive volume of blood received in culture bottles   Culture   Final    NO GROWTH 2 DAYS Performed at [facility] [address] [place], [place]  [address]    Report Status PENDING  Incomplete  SARS Coronavirus 2 by RT PCR (hospital order, performed in [facility] hospital lab) *cepheid single result test* Anterior Nasal Swab     Status: None   Collection Time: 12/09/22 12:18 AM   Specimen: Anterior Nasal Swab  Result Value Ref Range Status   SARS Coronavirus 2 by RT PCR NEGATIVE NEGATIVE Final    Comment: (NOTE) SARS-CoV-2 target nucleic acids are NOT DETECTED.  The SARS-CoV-2 RNA is generally detectable in upper and lower respiratory specimens during the acute phase of infection. The lowest concentration of SARS-CoV-2 viral copies this assay can detect is 250 copies / mL. A negative result does not preclude SARS-CoV-2 infection and should not be used as the sole basis for treatment or other patient management decisions.  A negative result may occur with improper specimen collection / handling, submission of specimen other than nasopharyngeal swab, presence of viral mutation(s) within the areas targeted by this assay, and inadequate number of viral copies (<250 copies / mL). A negative result must be combined with clinical observations, patient history, and epidemiological information.  Fact Sheet for Patients:   RoadLapTop.co.za  Fact Sheet for Healthcare Providers: http://kim-miller.com/  This test is not yet approved or  cleared by the [place] FDA and has been authorized for detection and/or diagnosis of SARS-CoV-2 by FDA under an Emergency Use Authorization (EUA).  This EUA will remain in effect (meaning this test can be used) for the duration of the COVID-19 declaration under Section 564(b)(1) of the Act, 21 U.S.C. section 360bbb-3(b)(1), unless the authorization is terminated or revoked sooner.  Performed at [facility], [address] [place], [place] [address]   MRSA Next Gen by PCR, Nasal     Status: None   Collection Time: 12/09/22  2:31 PM    Specimen: Urine, Clean Catch; Nasal Swab  Result Value Ref Range Status   MRSA by PCR Next Gen NOT DETECTED NOT DETECTED Final    Comment: (NOTE) The GeneXpert MRSA Assay (FDA approved for NASAL specimens only), is one component of a comprehensive MRSA colonization surveillance program. It is not intended to diagnose MRSA infection nor to guide or monitor treatment for MRSA infections. Test performance is not FDA approved in patients less than 68 years old. Performed at [facility], [address] [place], [place] [address]   Respiratory (~20 pathogens) panel by PCR     Status: None   Collection Time: 12/09/22  4:14 PM   Specimen: Nasopharyngeal Swab; Respiratory  Result Value Ref Range Status   Adenovirus NOT DETECTED NOT DETECTED  Final   Coronavirus 229E NOT DETECTED NOT DETECTED Final    Comment: (NOTE) The Coronavirus on the Respiratory Panel, DOES NOT test for the novel  Coronavirus (2019 nCoV)    Coronavirus HKU1 NOT DETECTED NOT DETECTED Final   Coronavirus NL63 NOT DETECTED NOT DETECTED Final   Coronavirus OC43 NOT DETECTED NOT DETECTED Final   Metapneumovirus NOT DETECTED NOT DETECTED Final   Rhinovirus / Enterovirus NOT DETECTED NOT DETECTED Final   Influenza A NOT DETECTED NOT DETECTED Final   Influenza B NOT DETECTED NOT DETECTED Final   Parainfluenza Virus 1 NOT DETECTED NOT DETECTED Final   Parainfluenza Virus 2 NOT DETECTED NOT DETECTED Final   Parainfluenza Virus 3 NOT DETECTED NOT DETECTED Final   Parainfluenza Virus 4 NOT DETECTED NOT DETECTED Final   Respiratory Syncytial Virus NOT DETECTED NOT DETECTED Final   Bordetella pertussis NOT DETECTED NOT DETECTED Final   Bordetella Parapertussis NOT DETECTED NOT DETECTED Final   Chlamydophila pneumoniae NOT DETECTED NOT DETECTED Final   Mycoplasma pneumoniae NOT DETECTED NOT DETECTED Final    Comment: Performed at [facility], [address], [place], [place] [address]         Radiology  Studies: CT ABDOMEN PELVIS W CONTRAST  Result Date: 12/09/2022 CLINICAL DATA:  Metastatic disease evaluation. History of vaginal cancer. Unknown date of diagnosis. EXAM: CT ABDOMEN AND PELVIS WITH CONTRAST TECHNIQUE: Multidetector CT imaging of the abdomen and pelvis was performed using the standard protocol following bolus administration of intravenous contrast. RADIATION DOSE REDUCTION: This exam was performed according to the departmental dose-optimization program which includes automated exposure control, adjustment of the mA and/or kV according to patient size and/or use of iterative reconstruction technique. CONTRAST:  OMNIPAQUE IOHEXOL 300 MG/ML  SOLN COMPARISON:  Only available body CT comparison is yesterday's CTA chest. No prior abdomen and pelvis CT. FINDINGS: Lower chest: The heart is slightly enlarged. There is no significant pericardial effusion. Elevated right hemidiaphragm is again shown. There are trace pleural effusions again noted as well as heterogeneous patchy ground-glass opacities in the left upper lobe visualized portion with underlying interstitial thickening, similar opacities interval increased in the medial right middle lobe and to a lesser extent in the lower lobes where the opacities are intermixed with coarse atelectatic markings. Consider multifocal pneumonia including viral and atypical etiologies. Hepatobiliary: The liver is moderately steatotic without mass enhancement. No significant liver enlargement. The gallbladder and bile ducts are unremarkable. Pancreas: Partially atrophic, otherwise unremarkable. Spleen: No abnormality. Adrenals/Urinary Tract: Adrenal glands are unremarkable. Kidneys are normal, without renal calculi, focal lesion, or hydronephrosis. Bladder is unremarkable. Stomach/Bowel: No dilatation or wall thickening including the retrocecal appendix. Uncomplicated sigmoid diverticulosis. Normal small stool burden. Vascular/Lymphatic: Aortic atherosclerosis.  No enlarged abdominal or pelvic lymph nodes. Reproductive: The uterus is either small and atrophic or the patient could have had a supracervical hysterectomy. The ovaries are not enlarged. There is a small calcified fibroid in the right wall of the uterus or cervix remnant. There are metallic structures along either side of the vaginal cuff which could be surgical clips or fiducial markers. There is no overt thickening of the vaginal cuff. Other: Small umbilical fat hernia. No incarcerated hernia. No free fluid, free hemorrhage or free air, or acute inflammatory changes. Musculoskeletal: Mild degenerative change of the spine. No destructive bone lesions. IMPRESSION: 1. No acute findings or CT evidence of metastatic disease in the abdomen or pelvis. 2. Moderate hepatic steatosis. 3. Aortic atherosclerosis. 4. Diverticulosis without evidence  of diverticulitis. 5. Trace pleural effusions with heterogeneous ground-glass opacities and interstitial thickening in the visualized lung fields. Consider multifocal pneumonia including viral and atypical etiologies. 6. Small umbilical fat hernia. 7. Metallic structures along either side of the vaginal cuff which could be surgical clips or fiducial markers. There is no overt thickening of the vaginal cuff. Aortic Atherosclerosis (ICD10-I70.0). Electronically Signed   By: Almira Bar M.D.   On: 12/09/2022 20:26        Scheduled Meds:  aspirin EC  81 mg Oral Daily   cholecalciferol  1,000 Units Oral Daily   heparin  5,000 Units Subcutaneous Q8H   insulin aspart  0-15 Units Subcutaneous TID WC   latanoprost  1 drop Right Eye QHS   multivitamin with minerals  1 tablet Oral Daily   pantoprazole  40 mg Oral Daily   rosuvastatin  10 mg Oral Daily   sucralfate  1 g Oral TID WC & HS   Continuous Infusions:     LOS: 1 day      Tresa Moore, MD [facility]   If 7PM-7AM, please contact night-coverage  12/11/2022, 1:11 PM

## 2022-12-11 NOTE — Telephone Encounter (Signed)
Message from Manning Charity Patient is still admitted Can you get her scheduled in 6 months with Dr. Katrinka Blazing with an MRI brain with and without prior?

## 2022-12-11 NOTE — Progress Notes (Signed)
Physical Therapy Treatment Patient Details Name: Vickie Stewart MRN: [id number] DOB: 25-May-1946 Today's Date: 12/11/2022   History of Present Illness Pt is a 77 y/o F admitted on 12/08/22 after presenting with c/o BLE weakness. MRI showed "small enhancing lesions in the right posterior elements at T6 & T10, as well as possibly at L1." PMH: meningioma of the right parafalcine region s/p IMRT, HTN, HLD, anxiety, glaucoma, obesity, vaginal CA, thoracic or lumbosacral neuritis or radiculitis    PT Comments  Pt seen for PT tx with pt agreeable. Pt endorses back pain that worsens in recliner to pt left in bed at end of session. Pt is able to complete bed mobility with supervision<>min assist with reliance on HOB elevated & bed rails, movement is effortful for pt. Pt is able to transfer STS with min assist fade to CGA with improving anterior weight shifting during transfer.  Pt ambulates increased distance with RW & CGA before toileting. Pt is making steady progress with therapy but would benefit from ongoing PT services to address strengthening, balance, endurance, & reduce fall risk with mobility prior to return home. Of note, pt reports she was able to mow 2 acres/week prior to BLE weakness.    Assistance Recommended at Discharge Intermittent Supervision/Assistance  If plan is discharge home, recommend the following:  Can travel by private vehicle    A little help with walking and/or transfers;A little help with bathing/dressing/bathroom;Assistance with cooking/housework;Help with stairs or ramp for entrance;Assist for transportation   Yes  Equipment Recommendations  Rolling walker (2 wheels)    Recommendations for Other Services       Precautions / Restrictions Precautions Precautions: Fall Restrictions Weight Bearing Restrictions: No     Mobility  Bed Mobility Overal bed mobility: Needs Assistance Bed Mobility: Supine to Sit     Supine to sit: Min assist, HOB elevated (holds  to PTs hand to upright trunk to sit EOB) Sit to supine: Supervision, HOB elevated (uses BUE to assist BLE onto bed)        Transfers Overall transfer level: Needs assistance Equipment used: Rolling walker (2 wheels) Transfers: Sit to/from Stand Sit to Stand: Min assist, Min guard           General transfer comment: Min assist fade to CGA from EOB & toilet with use of grab bars (cuing for hand placement); on initial STS pt with posterior lean requiring assistance to correct    Ambulation/Gait Ambulation/Gait assistance: Min guard Gait Distance (Feet): 120 Feet Assistive device: Rolling walker (2 wheels) Gait Pattern/deviations: Decreased step length - right, Decreased step length - left, Decreased stride length, Decreased dorsiflexion - left, Decreased dorsiflexion - right Gait velocity: decreased     General Gait Details: decreased heel strike BLE despite cuing for correction   Stairs             Wheelchair Mobility     Tilt Bed    Modified Rankin (Stroke Patients Only)       Balance Overall balance assessment: Needs assistance Sitting-balance support: No upper extremity supported, Feet supported Sitting balance-Leahy Scale: Good Sitting balance - Comments: peri hygiene sitting on toilet with supervision   Standing balance support: During functional activity, Bilateral upper extremity supported, Single extremity supported, Reliant on assistive device for balance Standing balance-Leahy Scale: Fair                              Cognition Arousal/Alertness: Awake/alert Behavior During  Therapy: WFL for tasks assessed/performed Overall Cognitive Status: Within Functional Limits for tasks assessed                                          Exercises      General Comments General comments (skin integrity, edema, etc.): Pt on 2L/min via nasal cannula with SPO2 93-94%. Pt with incontinent & continent BM, requiring assistance for  thorough peri hygiene.      Pertinent Vitals/Pain Pain Assessment Pain Assessment: Faces Faces Pain Scale: Hurts even more Pain Location: back Pain Descriptors / Indicators: Discomfort, Grimacing Pain Intervention(s): Monitored during session    Home Living                          Prior Function            PT Goals (current goals can now be found in the care plan section) Acute Rehab PT Goals Patient Stated Goal: to be able to walk before she goes home PT Goal Formulation: With patient Time For Goal Achievement: 12/23/22 Potential to Achieve Goals: Good Progress towards PT goals: Progressing toward goals    Frequency    Min 1X/week      PT Plan Current plan remains appropriate    Co-evaluation              AM-PAC PT "6 Clicks" Mobility   Outcome Measure  Help needed turning from your back to your side while in a flat bed without using bedrails?: A Little Help needed moving from lying on your back to sitting on the side of a flat bed without using bedrails?: A Little Help needed moving to and from a bed to a chair (including a wheelchair)?: A Little Help needed standing up from a chair using your arms (e.g., wheelchair or bedside chair)?: A Little Help needed to walk in hospital room?: A Little Help needed climbing 3-5 steps with a railing? : A Little 6 Click Score: 18    End of Session Equipment Utilized During Treatment: Oxygen Activity Tolerance: Patient tolerated treatment well Patient left: in bed;with bed alarm set;with call bell/phone within reach   PT Visit Diagnosis: Unsteadiness on feet (R26.81);Other abnormalities of gait and mobility (R26.89);Muscle weakness (generalized) (M62.81);Difficulty in walking, not elsewhere classified (R26.2);Pain Pain - part of body:  (back)     Time: 1191-4782 PT Time Calculation (min) (ACUTE ONLY): 28 min  Charges:    $Therapeutic Activity: 23-37 mins PT General Charges $$ ACUTE PT VISIT: 1  Visit                     Aleda Grana, PT, DPT 12/11/22, 2:14 PM   Sandi Mariscal 12/11/2022, 2:12 PM

## 2022-12-11 NOTE — TOC Initial Note (Signed)
Transition of Care [facility]) - Initial/Assessment Note    Vickie Stewart Details  Name: Vickie Stewart MRN: [id number] Date of Birth: January 14, 1946  Transition [facility] CM/SW Contact:    Garret Reddish, RN Phone Number: 12/11/2022, 3:37 PM  Clinical Narrative:     Chart reviewed.  I have spoken with Vickie Stewart.  She informs me that prior to admission she lived at home by herself.  She reported that she was able to get around her home with a walker.  Vickie Stewart reports that she also has a shower chair.  She reports that she was doing the best she could with bathing and dressing her self.  Vickie Stewart reports that she does not drive.  Her brother takes her to appointment.    I have spoken to Vickie Stewart about Short term Rehab. She was agreeable. I have explained the SNF process.  Mrs. Urso was agreeable to a bed search in the [place] area.  Fl 2 completed,  and PASSR is pending.  I have completed a bed search to the [place] area.  TOC will continue to follow for discharge planning.    TOC will continue to follow for discharge planning.                Expected Discharge Plan: Skilled Nursing Facility Barriers to Discharge: No Barriers Identified   Vickie Stewart Goals and CMS Choice   CMS Medicare.gov Compare Post Acute Care list provided to:: Vickie Stewart Choice offered to / list presented to : Vickie Stewart      Expected Discharge Plan and Services   Discharge Planning Services: CM Consult Post Acute Care Choice: Skilled Nursing Facility Living arrangements for the past 2 months: Single Family Home                                      Prior Living Arrangements/Services Living arrangements for the past 2 months: Single Family Home Lives with:: Self   Do you feel safe going back to the place where you live?: Yes        Care giver support system in place?: Yes (comment) (Vickie Stewart has a supportive brother) Current home services:  (Reports that Home Health was going to start coming out but  did not get the chance too as she was admitted to the hospital.)    Activities of Daily Living Home Assistive Devices/Equipment: Dan Humphreys (specify type) ADL Screening (condition at time of admission) Vickie Stewart's cognitive ability adequate to safely complete daily activities?: Yes Is the Vickie Stewart deaf or have difficulty hearing?: No Does the Vickie Stewart have difficulty seeing, even when wearing glasses/contacts?: No Does the Vickie Stewart have difficulty concentrating, remembering, or making decisions?: No Vickie Stewart able to express need for assistance with ADLs?: Yes Does the Vickie Stewart have difficulty dressing or bathing?: Yes Independently performs ADLs?: No Communication: Needs assistance Is this a change from baseline?: Change from baseline, expected to last <3 days Dressing (OT): Needs assistance Is this a change from baseline?: Change from baseline, expected to last >3 days Grooming: Needs assistance Is this a change from baseline?: Change from baseline, expected to last >3 days Feeding: Needs assistance Is this a change from baseline?: Change from baseline, expected to last >3 days Bathing: Needs assistance Is this a change from baseline?: Change from baseline, expected to last >3 days Toileting: Needs assistance Is this a change from baseline?: Change from baseline, expected to last >3days In/Out Bed: Needs assistance  Is this a change from baseline?: Change from baseline, expected to last >3 days Walks in Home: Needs assistance Is this a change from baseline?: Change from baseline, expected to last >3 days Does the Vickie Stewart have difficulty walking or climbing stairs?: Yes Weakness of Legs: Both Weakness of Arms/Hands: None  Permission Sought/Granted                  Emotional Assessment       Orientation: : Oriented to Self, Oriented to Place, Oriented to  Time, Oriented to Situation      Admission diagnosis:  Bilateral leg weakness [R29.898] Generalized weakness  [R53.1] Multifocal pneumonia [J18.9] Inability to ambulate due to multiple joints [R26.2] Vickie Stewart Active Problem List   Diagnosis Date Noted   Inability to ambulate due to multiple joints 12/10/2022   PNA (pneumonia) 12/09/2022   Acute respiratory failure with hypoxia (HCC) 12/09/2022   Bilateral leg weakness 12/09/2022   Lower extremity weakness 12/04/2022   Thrombocytopenia (HCC) 12/04/2022   Pre-diabetes 12/04/2022   ASCUS of cervix with negative high risk HPV 01/07/2022   Occipital neuralgia of left side 01/07/2022   Balance problems 01/07/2022   Vertigo 01/07/2022   Pulsatile tinnitus of right ear 06/18/2020   Pulsatile neck mass 06/18/2020   OSA (obstructive sleep apnea) 09/19/2019   Anxiety and depression 09/19/2019   Other neutropenia (HCC) 08/11/2019   Left thyroid nodule 08/08/2019   Chronic neck and back pain 01/03/2019   Meningioma (HCC) 10/04/2017   Lichen sclerosus 03/24/2016   Vaginal atrophy 03/18/2016   Encounter for follow-up surveillance of vaginal cancer 03/18/2016   Eczema 10/31/2015   Left hand pain 09/10/2015   Osteopenia after menopause 07/26/2015   Menopause 07/26/2015   Irritable colon 07/26/2015   History of cancer of vagina 07/26/2015   Diffuse cystic mastopathy 07/26/2015   Obesity (BMI 30.0-34.9) 07/26/2015   Intermittent low back pain 07/26/2015   Hyperlipemia 07/26/2015   Osteoarthritis 05/13/2015   Allergic rhinitis 05/13/2015   Gastroesophageal reflux disease without esophagitis 05/13/2015   Essential hypertension, benign    PCP:  Alba Cory, MD Pharmacy:   [facility] [address], [place] - [address] [place] [address] Phone: [phone] Fax: [phone]  [facility] [id number] [place], [place] - [address] [place] [address] Phone: [phone] Fax: [phone]     Social Determinants of Health (SDOH) Social History: SDOH Screenings   Food Insecurity: No  Food Insecurity (12/09/2022)  Housing: Low Risk  (12/09/2022)  Transportation Needs: No Transportation Needs (12/09/2022)  Utilities: Not At Risk (12/09/2022)  Alcohol Screen: Low Risk  (09/03/2021)  Depression (PHQ2-9): Low Risk  (09/28/2022)  Financial Resource Strain: Low Risk  (09/03/2021)  Physical Activity: Sufficiently Active (09/03/2021)  Social Connections: Moderately Integrated (09/03/2021)  Stress: No Stress Concern Present (09/03/2021)  Tobacco Use: Low Risk  (12/09/2022)   SDOH Interventions:     Readmission Risk Interventions    12/11/2022    3:34 PM  Readmission Risk Prevention Plan  Post Dischage Appt Complete  Medication Screening Complete  Transportation Screening Complete

## 2022-12-11 NOTE — NC FL2 (Signed)
[place] MEDICAID FL2 LEVEL OF CARE FORM     IDENTIFICATION  Patient Name: Vickie Stewart Birthdate: September 18, 1945 Sex: female Admission Date (Current Location): 12/08/2022  [facility] [place] and [place] Number:  Chiropodist and Address:  [facility], [address] [place], [place] [address]      Provider Number: [id number]  Attending Physician Name and Address:  Tresa Moore, MD  Relative Name and Phone Number:  Clovia Cuff [phone]    Current Level of Care: Hospital Recommended Level of Care: Skilled Nursing Facility Prior Approval Number:    Date Approved/Denied:   PASRR Number: Pending  Discharge Plan: SNF    Current Diagnoses: Patient Active Problem List   Diagnosis Date Noted   Inability to ambulate due to multiple joints 12/10/2022   PNA (pneumonia) 12/09/2022   Acute respiratory failure with hypoxia (HCC) 12/09/2022   Bilateral leg weakness 12/09/2022   Lower extremity weakness 12/04/2022   Thrombocytopenia (HCC) 12/04/2022   Pre-diabetes 12/04/2022   ASCUS of cervix with negative high risk HPV 01/07/2022   Occipital neuralgia of left side 01/07/2022   Balance problems 01/07/2022   Vertigo 01/07/2022   Pulsatile tinnitus of right ear 06/18/2020   Pulsatile neck mass 06/18/2020   OSA (obstructive sleep apnea) 09/19/2019   Anxiety and depression 09/19/2019   Other neutropenia (HCC) 08/11/2019   Left thyroid nodule 08/08/2019   Chronic neck and back pain 01/03/2019   Meningioma (HCC) 10/04/2017   Lichen sclerosus 03/24/2016   Vaginal atrophy 03/18/2016   Encounter for follow-up surveillance of vaginal cancer 03/18/2016   Eczema 10/31/2015   Left hand pain 09/10/2015   Osteopenia after menopause 07/26/2015   Menopause 07/26/2015   Irritable colon 07/26/2015   History of cancer of vagina 07/26/2015   Diffuse cystic mastopathy 07/26/2015   Obesity (BMI 30.0-34.9) 07/26/2015   Intermittent low back pain  07/26/2015   Hyperlipemia 07/26/2015   Osteoarthritis 05/13/2015   Allergic rhinitis 05/13/2015   Gastroesophageal reflux disease without esophagitis 05/13/2015   Essential hypertension, benign     Orientation RESPIRATION BLADDER Height & Weight     Self, Time, Situation, Place  O2 (3L per [place]) Continent Weight: 82.6 kg Height:  5\' 2"  (157.5 cm)  BEHAVIORAL SYMPTOMS/MOOD NEUROLOGICAL BOWEL NUTRITION STATUS      Incontinent  (See Discharge Summary)  AMBULATORY STATUS COMMUNICATION OF NEEDS Skin   Extensive Assist Verbally Normal                       Personal Care Assistance Level of Assistance  Bathing, Feeding, Dressing Bathing Assistance: Maximum assistance Feeding assistance: Limited assistance Dressing Assistance: Maximum assistance     Functional Limitations Info  Sight, Hearing, Speech Sight Info: Adequate Hearing Info: Adequate Speech Info: Adequate    SPECIAL CARE FACTORS FREQUENCY  PT (By licensed PT), OT (By licensed OT)     PT Frequency: 5x weekly OT Frequency: 5x weekly            Contractures Contractures Info: Not present    Additional Factors Info  Code Status, Allergies Code Status Info: Full Allergies Info: Atorvastatin, Terbinafine And Related, Zetia (Ezetimibe)           Current Medications (12/11/2022):  This is the current hospital active medication list Current Facility-Administered Medications  Medication Dose Route Frequency Provider Last Rate Last Admin   aspirin EC tablet 81 mg  81 mg Oral Daily Gertha Calkin, MD   81 mg  at 12/11/22 0827   cholecalciferol (VITAMIN D3) 25 MCG (1000 UNIT) tablet 1,000 Units  1,000 Units Oral Daily Gertha Calkin, MD   1,000 Units at 12/11/22 0827   heparin injection 5,000 Units  5,000 Units Subcutaneous Q8H Gertha Calkin, MD   5,000 Units at 12/11/22 0522   insulin aspart (novoLOG) injection 0-15 Units  0-15 Units Subcutaneous TID WC Gertha Calkin, MD   2 Units at 12/11/22 1208   latanoprost  (XALATAN) 0.005 % ophthalmic solution 1 drop  1 drop Right Eye QHS Lolita Patella B, MD   1 drop at 12/10/22 2212   multivitamin with minerals tablet 1 tablet  1 tablet Oral Daily Lolita Patella B, MD   1 tablet at 12/11/22 0827   pantoprazole (PROTONIX) EC tablet 40 mg  40 mg Oral Daily Lolita Patella B, MD   40 mg at 12/11/22 0827   phenol (CHLORASEPTIC) mouth spray 1 spray  1 spray Mouth/Throat PRN Lolita Patella B, MD       rosuvastatin (CRESTOR) tablet 10 mg  10 mg Oral Daily Georgeann Oppenheim, Sudheer B, MD   10 mg at 12/11/22 0827   sucralfate (CARAFATE) tablet 1 g  1 g Oral TID WC & HS Lolita Patella B, MD   1 g at 12/11/22 1208     Discharge Medications: Please see discharge summary for a list of discharge medications.  Relevant Imaging Results:  Relevant Lab Results:   Additional Information [id number]  Garret Reddish, RN

## 2022-12-11 NOTE — Progress Notes (Signed)
   12/11/22 2049  BiPAP/CPAP/SIPAP  Reason BIPAP/CPAP not in use Non-compliant (Patient refused at this time, resmed removed from bedside)   Patient declined CPAP at this time, states she does not wish to wear during this admission. Hospital unit removed from bedside at this time.

## 2022-12-12 DIAGNOSIS — R29898 Other symptoms and signs involving the musculoskeletal system: Secondary | ICD-10-CM | POA: Diagnosis not present

## 2022-12-12 LAB — GLUCOSE, CAPILLARY
Glucose-Capillary: 81 mg/dL (ref 70–99)
Glucose-Capillary: 151 mg/dL — ABNORMAL HIGH (ref 70–99)
Glucose-Capillary: 123 mg/dL — ABNORMAL HIGH (ref 70–99)
Glucose-Capillary: 102 mg/dL — ABNORMAL HIGH (ref 70–99)

## 2022-12-12 MED ORDER — MIRTAZAPINE 15 MG PO TABS
15.0000 mg | ORAL_TABLET | Freq: Every day | ORAL | Status: DC
  Administered 2022-12-12 – 2022-12-14 (×3): 15 mg via ORAL
  Filled 2022-12-12 (×3): qty 1

## 2022-12-12 NOTE — Progress Notes (Signed)
PROGRESS NOTE    Vickie Stewart  EXB:[id number] DOB: 02/09/1946 DOA: 12/08/2022 PCP: Alba Cory, MD    Brief Narrative:  77 y.o. female with medical history significant for meningioma of the right parafalcine region s/p IMRT, hypertension, hyperlipidemia, reflux esophagitis,  Coming to [place] bilateral leg weakness.  It originally started on the 7/5 and patient had an MRI of the brain with and without contrast.  Patient was recently discharged on 6 July.  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  Patient is no longer on steroids per discharge summary.  Per PT note and chart review patient needs A little help with walking and/or transfers;Help with stairs or ramp for entrance;Assist for transportation.  Patient today also complains of generalized weakness.  States that she lives with her brother who is her healthcare power of attorney and has been helping at home.  Patient is a limited historian otherwise she does not report any shortness of breath but is found to be hypoxic with O2 sats of 89% in no distress.  Initial vitals also show blood pressure of 107/57 heart rate of 99 respirations of 18.  CTA chest showed bilateral infiltrates no pulmonary embolism.  Exam is otherwise normal except that patient is extremely weak.  Procalcitonin is negative.  Normal white count.   Etiology of current presentation is unclear.  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  Also previous admission considering nutritional deficiencies resulting in weakness and decreased ability to ambulate.  I have engaged neurosurgery for recommendations.  Chest CT findings of unclear etiology.  Demonstrating bilateral infiltrates however no other clinical signs of infection.  Normal white count, no fevers.  7/13: Fungitell positive.  Pulmonology engaged for comment.   Assessment & Plan:   Principal Problem:   Lower extremity weakness Active Problems:   Meningioma  (HCC)   Bilateral leg weakness   Thrombocytopenia (HCC)   Essential hypertension, benign   OSA (obstructive sleep apnea)   Pre-diabetes   Gastroesophageal reflux disease without esophagitis   PNA (pneumonia)   Acute respiratory failure with hypoxia (HCC)   Inability to ambulate due to multiple joints  Bilateral lower extremity weakness Unclear etiology.  History of leg weakness progressive over 6 weeks.  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  MRI lumbar and thoracic spine with evidence of lucencies concerning for possible metastatic disease. Seen in consultation by neurosurgery.  No neurosurgical intervention warranted.  Seen on previous admission by neurology.  No neurologic etiology.  Suspect nutritional etiology.  Patient eating less than 25% of her meals. Plan: Continue out of bed exercises.  PT OT as tolerated.  Ambulate frequently.  Optimize nutritional status.  Will need skilled nursing facility.  TOC engaged.  Medically stable for discharge.  [facility]  Bed search in progress  Meningioma Lumbar spinal lucency Patient follows with [facility] neurolog[facility].  Receiving RT with Dr. Aggie Cosier. Inpatient oncology consulted.  Plan for outpatient PET scan for better characterization.  Discussed with IR.  Lesions not amenable to image guided biopsy.  Oncology aware.  Will arrange for outpatient PET  Liver lesions, ruled out CT abdomen pelvis did not demonstrate any liver lesions  Thrombocytopenia Appears chronic and asymptomatic  Essential hypertension Blood pressure soft.  Home diltiazem and Benicar on hold  Obstructive sleep apnea Goes for CPAP  Acute respiratory failure with hypoxia Ruled out Patient on room air  Possible pneumonia, ruled out Unclear etiology.  Findings on CT suspicious  for possible COVID Procalcitonin negative.  No fevers.  No elevated white count COVID swab negative Suspicion low for bacterial infection RVP  negative Plan: Monitor off antibiotics Strep pneumo urinary antigen negative Follow-up Legionella and Fungitell, pending Check strep pneumo, Legionella, Fungitell: All pending Check respiratory viral panel, negative Consider outpatient pulmonary evaluation  Bilateral interstitial/groundglass opacities Unclear etiology.  Fungitell positive. Plan: Pulmonology engaged for comment.  Recommendations appreciated.  Case discussed with Dr. Karna Christmas  GERD PPI    DVT prophylaxis: SQ heparin Code Status: Full Family Communication:None today Disposition Plan: Status is: Inpatient Remains inpatient appropriate because: Unsafe discharge plan.  Will need skilled nursing facility.     Level of care: Med-Surg  Consultants:  Neurosurgery  Procedures:  None  Antimicrobials:   Subjective: Seen and examined.  Resting in bed.  No visible distress.  No complaints of pain.  Reports that mobility is improving  Objective: Vitals:   12/11/22 0728 12/11/22 1541 12/12/22 0009 12/12/22 0834  BP: (!) 113/59 116/69 106/63 113/65  Pulse: 73 78 71 60  Resp: 15 16 18 15   Temp: 97.8 F (36.6 C) 98.3 F (36.8 C) 99.1 F (37.3 C) 97.9 F (36.6 C)  TempSrc:      SpO2: 99% 100% 98% 99%  Weight:      Height:       No intake or output data in the 24 hours ending 12/12/22 1050  Filed Weights   12/08/22 1726  Weight: 82.6 kg    Examination:  General exam: NAD Respiratory system: Scattered Crackles.  Normal work of breathing.  Room air Cardiovascular system: S1-S2, RRR, no murmurs, no pedal edema Gastrointestinal system: Soft, NT/ND, normal bowel sounds Central nervous system: Alert and oriented. No focal neurological deficits. Extremities: Decreased power bilateral lower extremities.  Gait not assessed Skin: No rashes, lesions or ulcers Psychiatry: Judgement and insight appear normal. Mood & affect appropriate.     Data Reviewed: I have personally reviewed following labs and  imaging studies  CBC: Recent Labs  Lab 12/08/22 1733 12/09/22 0213  WBC 5.7 5.4  HGB 11.8* 10.1*  HCT 34.8* 30.7*  MCV 87.4 89.5  PLT 136* 136*   Basic Metabolic Panel: Recent Labs  Lab 12/08/22 1733 12/09/22 0213 12/10/22 0517  NA 137  --   --   K 3.7  --   --   CL 103  --   --   CO2 23  --   --   GLUCOSE 112*  --   --   BUN 12  --   --   CREATININE 0.63 0.65 0.52  CALCIUM 8.3*  --   --    GFR: Estimated Creatinine Clearance: 60.5 mL/min (by C-G formula based on SCr of 0.52 mg/dL). Liver Function Tests: Recent Labs  Lab 12/08/22 1733  AST 38  ALT 39  ALKPHOS 58  BILITOT 0.7  PROT 5.8*  ALBUMIN 2.5*   No results for input(s): "LIPASE", "AMYLASE" in the last 168 hours. No results for input(s): "AMMONIA" in the last 168 hours. Coagulation Profile: Recent Labs  Lab 12/09/22 0653  INR 1.2   Cardiac Enzymes: Recent Labs  Lab 12/08/22 1733  CKTOTAL 39   BNP (last 3 results) No results for input(s): "PROBNP" in the last 8760 hours. HbA1C: No results for input(s): "HGBA1C" in the last 72 hours. CBG: Recent Labs  Lab 12/11/22 0727 12/11/22 1157 12/11/22 1707 12/11/22 2103 12/12/22 0813  GLUCAP 103* 145* 77 88 81   Lipid Profile: No results  for input(s): "CHOL", "HDL", "LDLCALC", "TRIG", "CHOLHDL", "LDLDIRECT" in the last 72 hours. Thyroid Function Tests: No results for input(s): "TSH", "T4TOTAL", "FREET4", "T3FREE", "THYROIDAB" in the last 72 hours.  Anemia Panel: No results for input(s): "VITAMINB12", "FOLATE", "FERRITIN", "TIBC", "IRON", "RETICCTPCT" in the last 72 hours. Sepsis Labs: Recent Labs  Lab 12/08/22 1733 12/09/22 0213 12/09/22 0653  PROCALCITON 0.20  --   --   LATICACIDVEN  --  0.9 1.4    Recent Results (from the past 240 hour(s))  Blood culture (single)     Status: None (Preliminary result)   Collection Time: 12/09/22 12:18 AM   Specimen: BLOOD  Result Value Ref Range Status   Specimen Description BLOOD BLOOD RIGHT ARM   Final   Special Requests   Final    BOTTLES DRAWN AEROBIC AND ANAEROBIC Blood Culture results may not be optimal due to an excessive volume of blood received in culture bottles   Culture   Final    NO GROWTH 3 DAYS Performed at [facility], [address], [place], [place] [address]    Report Status PENDING  Incomplete  SARS Coronavirus 2 by RT PCR (hospital order, performed in [facility] hospital lab) *cepheid single result test* Anterior Nasal Swab     Status: None   Collection Time: 12/09/22 12:18 AM   Specimen: Anterior Nasal Swab  Result Value Ref Range Status   SARS Coronavirus 2 by RT PCR NEGATIVE NEGATIVE Final    Comment: (NOTE) SARS-CoV-2 target nucleic acids are NOT DETECTED.  The SARS-CoV-2 RNA is generally detectable in upper and lower respiratory specimens during the acute phase of infection. The lowest concentration of SARS-CoV-2 viral copies this assay can detect is 250 copies / mL. A negative result does not preclude SARS-CoV-2 infection and should not be used as the sole basis for treatment or other patient management decisions.  A negative result may occur with improper specimen collection / handling, submission of specimen other than nasopharyngeal swab, presence of viral mutation(s) within the areas targeted by this assay, and inadequate number of viral copies (<250 copies / mL). A negative result must be combined with clinical observations, patient history, and epidemiological information.  Fact Sheet for Patients:   RoadLapTop.co.za  Fact Sheet for Healthcare Providers: http://kim-miller.com/  This test is not yet approved or  cleared by the [place] FDA and has been authorized for detection and/or diagnosis of SARS-CoV-2 by FDA under an Emergency Use Authorization (EUA).  This EUA will remain in effect (meaning this test can be used) for the duration of the COVID-19 declaration under Section  564(b)(1) of the Act, 21 U.S.C. section 360bbb-3(b)(1), unless the authorization is terminated or revoked sooner.  Performed at [facility] [address] [place], [place] [address]   MRSA Next Gen by PCR, Nasal     Status: None   Collection Time: 12/09/22  2:31 PM   Specimen: Urine, Clean Catch; Nasal Swab  Result Value Ref Range Status   MRSA by PCR Next Gen NOT DETECTED NOT DETECTED Final    Comment: (NOTE) The GeneXpert MRSA Assay (FDA approved for NASAL specimens only), is one component of a comprehensive MRSA colonization surveillance program. It is not intended to diagnose MRSA infection nor to guide or monitor treatment for MRSA infections. Test performance is not FDA approved in patients less than 81 years old. Performed at [facility], [address], [place], [place] [address]   Respiratory (~20 pathogens) panel by PCR  Status: None   Collection Time: 12/09/22  4:14 PM   Specimen: Nasopharyngeal Swab; Respiratory  Result Value Ref Range Status   Adenovirus NOT DETECTED NOT DETECTED Final   Coronavirus 229E NOT DETECTED NOT DETECTED Final    Comment: (NOTE) The Coronavirus on the Respiratory Panel, DOES NOT test for the novel  Coronavirus (2019 nCoV)    Coronavirus HKU1 NOT DETECTED NOT DETECTED Final   Coronavirus NL63 NOT DETECTED NOT DETECTED Final   Coronavirus OC43 NOT DETECTED NOT DETECTED Final   Metapneumovirus NOT DETECTED NOT DETECTED Final   Rhinovirus / Enterovirus NOT DETECTED NOT DETECTED Final   Influenza A NOT DETECTED NOT DETECTED Final   Influenza B NOT DETECTED NOT DETECTED Final   Parainfluenza Virus 1 NOT DETECTED NOT DETECTED Final   Parainfluenza Virus 2 NOT DETECTED NOT DETECTED Final   Parainfluenza Virus 3 NOT DETECTED NOT DETECTED Final   Parainfluenza Virus 4 NOT DETECTED NOT DETECTED Final   Respiratory Syncytial Virus NOT DETECTED NOT DETECTED Final   Bordetella pertussis NOT DETECTED NOT DETECTED Final    Bordetella Parapertussis NOT DETECTED NOT DETECTED Final   Chlamydophila pneumoniae NOT DETECTED NOT DETECTED Final   Mycoplasma pneumoniae NOT DETECTED NOT DETECTED Final    Comment: Performed at [facility], [address] [place], [place] [address]         Radiology Studies: No results found.      Scheduled Meds:  aspirin EC  81 mg Oral Daily   cholecalciferol  1,000 Units Oral Daily   heparin  5,000 Units Subcutaneous Q8H   insulin aspart  0-15 Units Subcutaneous TID WC   latanoprost  1 drop Right Eye QHS   mirtazapine  15 mg Oral QHS   multivitamin with minerals  1 tablet Oral Daily   pantoprazole  40 mg Oral Daily   rosuvastatin  10 mg Oral Daily   sucralfate  1 g Oral TID WC & HS   Continuous Infusions:     LOS: 2 days      Tresa Moore, MD [facility]   If 7PM-7AM, please contact night-coverage  12/12/2022, 10:50 AM

## 2022-12-12 NOTE — TOC Progression Note (Signed)
Transition of Care [facility]) - Progression Note    Patient Details  Name: Vickie Stewart MRN: [id number] Date of Birth: 1946-05-01  Transition of Care [facility]) CM/SW Contact  Susa Simmonds, [place] Phone Number: 12/12/2022, 8:51 AM  Clinical Narrative:   CSW received a call from patient asking for an update. CSW explained there are no bed offers at this time. Patient states she prefers [facility].     Expected Discharge Plan: Skilled Nursing Facility Barriers to Discharge: No Barriers Identified  Expected Discharge Plan and Services   Discharge Planning Services: CM Consult Post Acute Care Choice: Skilled Nursing Facility Living arrangements for the past 2 months: Single Family Home                                       Social Determinants of Health (SDOH) Interventions SDOH Screenings   Food Insecurity: No Food Insecurity (12/09/2022)  Housing: Low Risk  (12/09/2022)  Transportation Needs: No Transportation Needs (12/09/2022)  Utilities: Not At Risk (12/09/2022)  Alcohol Screen: Low Risk  (09/03/2021)  Depression (PHQ2-9): Low Risk  (09/28/2022)  Financial Resource Strain: Low Risk  (09/03/2021)  Physical Activity: Sufficiently Active (09/03/2021)  Social Connections: Moderately Integrated (09/03/2021)  Stress: No Stress Concern Present (09/03/2021)  Tobacco Use: Low Risk  (12/09/2022)    Readmission Risk Interventions    12/11/2022    3:34 PM  Readmission Risk Prevention Plan  Post Dischage Appt Complete  Medication Screening Complete  Transportation Screening Complete

## 2022-12-12 NOTE — Plan of Care (Signed)
  Problem: Respiratory: Goal: Ability to maintain adequate ventilation will improve Outcome: Progressing   Problem: Skin Integrity: Goal: Risk for impaired skin integrity will decrease Outcome: Progressing   Problem: Safety: Goal: Ability to remain free from injury will improve Outcome: Progressing   Problem: Skin Integrity: Goal: Risk for impaired skin integrity will decrease Outcome: Progressing

## 2022-12-12 NOTE — Consult Note (Signed)
PULMONOLOGY         Date: 12/12/2022,   MRN# [id number] Vickie Stewart [birth year]     AdmissionWeight: 82.6 kg                 CurrentWeight: 82.6 kg  Referring provider: Dr Georgeann Oppenheim    CHIEF COMPLAINT:   Recurrent lung infiltrates   HISTORY OF PRESENT ILLNESS   This is a 77 yo F with hx of meningioma, GERD, HTN who came in for leg weakness. She had brain imaging with no signs of stroke. 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 She was found to be hypoxemic on arrival to hospital. CTA showed no PE but +bilateral infiltrates.  She had viral workup done which was negative. PCCM for further evaluation and management.    PAST MEDICAL HISTORY   Past Medical History:  Diagnosis Date   Allergic rhinitis, cause unspecified    Anxiety state, unspecified    Contact dermatitis and other eczema, due to unspecified cause    Diffuse cystic mastopathy    Dyspepsia and other specified disorders of function of stomach    Essential hypertension, benign    Glaucoma    Irritable bowel syndrome    Lateral epicondylitis  of elbow    Leukocytopenia, unspecified    Mastodynia    Obesity, unspecified    Pure hypercholesterolemia    Reflux esophagitis    Thoracic or lumbosacral neuritis or radiculitis, unspecified    Unspecified disorder of skin and subcutaneous tissue    Vaginal cancer (HCC)    History     SURGICAL HISTORY   Past Surgical History:  Procedure Laterality Date   BREAST EXCISIONAL BIOPSY Left    neg   BREAST LUMPECTOMY Left    COLONOSCOPY  08/26/2006   COLONOSCOPY WITH PROPOFOL N/A 10/21/2016   Procedure: COLONOSCOPY WITH PROPOFOL;  Surgeon: Kieth Brightly, MD;  Location: [facility] ENDOSCOPY;  Service: Endoscopy;  Laterality: N/A;   ESOPHAGOGASTRODUODENOSCOPY (EGD) WITH PROPOFOL N/A 02/07/2020   Procedure: ESOPHAGOGASTRODUODENOSCOPY (EGD) WITH PROPOFOL;  Surgeon: Toney Reil, MD;  Location:  [facility] ENDOSCOPY;  Service: Gastroenterology;  Laterality: N/A;     FAMILY HISTORY   Family History  Problem Relation Age of Onset   Pulmonary embolism Mother    Hypertension Father    Aortic stenosis Father    Breast cancer Neg Hx      SOCIAL HISTORY   Social History   Tobacco Use   Smoking status: Never   Smokeless tobacco: Never   Tobacco comments:    experimented with dip snuff as a child  Vaping Use   Vaping status: Never Used  Substance Use Topics   Alcohol use: No   Drug use: No     MEDICATIONS    Home Medication:    Current Medication:  Current Facility-Administered Medications:    aspirin EC tablet 81 mg, 81 mg, Oral, Daily, Irena Cords V, MD, 81 mg at 12/12/22 0837   cholecalciferol (VITAMIN D3) 25 MCG (1000 UNIT) tablet 1,000 Units, 1,000 Units, Oral, Daily, Irena Cords V, MD, 1,000 Units at 12/12/22 0837   heparin injection 5,000 Units, 5,000 Units, Subcutaneous, Q8H, Irena Cords V, MD, 5,000 Units at 12/12/22 1525   insulin aspart (novoLOG) injection 0-15 Units, 0-15 Units, Subcutaneous, TID WC, Gertha Calkin, MD, 3 Units at 12/12/22 1241   latanoprost (XALATAN) 0.005 % ophthalmic solution 1 drop, 1 drop, Right Eye, QHS, Sreenath, [employer]  B, MD, 1 drop at 12/11/22 2147   mirtazapine (REMERON) tablet 15 mg, 15 mg, Oral, QHS, Sreenath, Sudheer B, MD   multivitamin with minerals tablet 1 tablet, 1 tablet, Oral, Daily, Georgeann Oppenheim, Sudheer B, MD, 1 tablet at 12/12/22 0837   pantoprazole (PROTONIX) EC tablet 40 mg, 40 mg, Oral, Daily, Sreenath, Sudheer B, MD, 40 mg at 12/12/22 0837   phenol (CHLORASEPTIC) mouth spray 1 spray, 1 spray, Mouth/Throat, PRN, Georgeann Oppenheim, Sudheer B, MD   rosuvastatin (CRESTOR) tablet 10 mg, 10 mg, Oral, Daily, Sreenath, Sudheer B, MD, 10 mg at 12/12/22 0837   sucralfate (CARAFATE) tablet 1 g, 1 g, Oral, TID WC & HS, Sreenath, Sudheer B, MD, 1 g at 12/12/22 1232    ALLERGIES   Atorvastatin, Terbinafine and related, and Zetia  [ezetimibe]     REVIEW OF SYSTEMS    Review of Systems:  Gen:  Denies  fever, sweats, chills weigh loss  HEENT: Denies blurred vision, double vision, ear pain, eye pain, hearing loss, nose bleeds, sore throat Cardiac:  No dizziness, chest pain or heaviness, chest tightness,edema Resp:   reports dyspnea chronically  Gi: Denies swallowing difficulty, stomach pain, nausea or vomiting, diarrhea, constipation, bowel incontinence Gu:  Denies bladder incontinence, burning urine Ext:   Denies Joint pain, stiffness or swelling Skin: Denies  skin rash, easy bruising or bleeding or hives Endoc:  Denies polyuria, polydipsia , polyphagia or weight change Psych:   Denies depression, insomnia or hallucinations   Other:  All other systems negative   VS: BP 113/65 (BP Location: Right Arm)   Pulse 60   Temp 97.9 F (36.6 C)   Resp 15   Ht 5\' 2"  (1.575 m)   Wt 82.6 kg   SpO2 99%   BMI 33.29 kg/m      PHYSICAL EXAM    GENERAL:NAD, no fevers, chills, no weakness no fatigue HEAD: Normocephalic, atraumatic.  EYES: Pupils equal, round, reactive to light. Extraocular muscles intact. No scleral icterus.  MOUTH: Moist mucosal membrane. Dentition intact. No abscess noted.  EAR, NOSE, THROAT: Clear without exudates. No external lesions.  NECK: Supple. No thyromegaly. No nodules. No JVD.  PULMONARY: decreased breath sounds with mild rhonchi worse at bases bilaterally.  CARDIOVASCULAR: S1 and S2. Regular rate and rhythm. No murmurs, rubs, or gallops. No edema. Pedal pulses 2+ bilaterally.  GASTROINTESTINAL: Soft, nontender, nondistended. No masses. Positive bowel sounds. No hepatosplenomegaly.  MUSCULOSKELETAL: No swelling, clubbing, or edema. Range of motion full in all extremities.  NEUROLOGIC: Cranial nerves II through XII are intact. No gross focal neurological deficits. Sensation intact. Reflexes intact.  SKIN: No ulceration, lesions, rashes, or cyanosis. Skin warm and dry. Turgor intact.   PSYCHIATRIC: Mood, affect within normal limits. The patient is awake, alert and oriented x 3. Insight, judgment intact.       IMAGING   Reviewed CT chest    ASSESSMENT/PLAN   Acute hypoxemic respiratory failure - present on admission -need to rule out active infection, currently patient on room air and without leukocytosis and does have normal vital signs, can perform below workup and review upon follow up in outpatient clinic.  - COVID19 negative  - supplemental O2 during my evaluation room air - will perform infectious workup for pneumonia -Respiratory viral panel-negative -serum fungitell-positive (note at times this  may be due to drug interaction such as antibiotics and lab error consider pharmacology consultation) -cryptococcal, blastomyces, galctomanan, histo urinary ag, aspergillus, fungal blood culture ordered -legionella ab-negative -strep pneumoniae ur AG-negative -Histoplasma  Ur Ag-in process -sputum resp cultures-in process -AFB sputum expectorated specimen -reviewed pertinent imaging with patient today - ESRelevated, crp ordered -Patient received Radiation therapy x 30 dose as recent as June 2024 and likely does have some residual pneumonitis at this time. She appears to be sensitive to steroids so I would continue off steroids since she's on room air now.  -PT/OT for d/c planning  -please encourage patient to use incentive spirometer few times each hour while hospitalized.           Thank you for allowing me to participate in the care of this patient.   Patient/Family are satisfied with care plan and all questions have been answered.    Provider disclosure: Patient with at least one acute or chronic illness or injury that poses a threat to life or bodily function and is being managed actively during this encounter.  All of the below services have been performed independently by signing provider:  review of prior documentation from internal and or external  health records.  Review of previous and current lab results.  Interview and comprehensive assessment during patient visit today. Review of current and previous chest radiographs/CT scans. Discussion of management and test interpretation with health care team and patient/family.   This document was prepared using Dragon voice recognition software and may include unintentional dictation errors.     Vida Rigger, M.D.  Division of Pulmonary & Critical Care Medicine

## 2022-12-12 NOTE — Plan of Care (Signed)

## 2022-12-13 DIAGNOSIS — R29898 Other symptoms and signs involving the musculoskeletal system: Secondary | ICD-10-CM | POA: Diagnosis not present

## 2022-12-13 LAB — GLUCOSE, CAPILLARY
Glucose-Capillary: 77 mg/dL (ref 70–99)
Glucose-Capillary: 152 mg/dL — ABNORMAL HIGH (ref 70–99)
Glucose-Capillary: 133 mg/dL — ABNORMAL HIGH (ref 70–99)
Glucose-Capillary: 136 mg/dL — ABNORMAL HIGH (ref 70–99)

## 2022-12-13 LAB — C-REACTIVE PROTEIN: CRP: 23.3 mg/dL — ABNORMAL HIGH (ref <1.0)

## 2022-12-13 NOTE — Plan of Care (Signed)

## 2022-12-13 NOTE — Progress Notes (Signed)
PROGRESS NOTE    Vickie Stewart  WUJ:[id number] DOB: 06-Nov-1945 DOA: 12/08/2022 PCP: Alba Cory, MD    Brief Narrative:  77 y.o. female with medical history significant for meningioma of the right parafalcine region s/p IMRT, hypertension, hyperlipidemia, reflux esophagitis,  Coming to [place] bilateral leg weakness.  It originally started on the 7/5 and patient had an MRI of the brain with and without contrast.  Patient was recently discharged on 6 July.  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  Patient is no longer on steroids per discharge summary.  Per PT note and chart review patient needs A little help with walking and/or transfers;Help with stairs or ramp for entrance;Assist for transportation.  Patient today also complains of generalized weakness.  States that she lives with her brother who is her healthcare power of attorney and has been helping at home.  Patient is a limited historian otherwise she does not report any shortness of breath but is found to be hypoxic with O2 sats of 89% in no distress.  Initial vitals also show blood pressure of 107/57 heart rate of 99 respirations of 18.  CTA chest showed bilateral infiltrates no pulmonary embolism.  Exam is otherwise normal except that patient is extremely weak.  Procalcitonin is negative.  Normal white count.   Etiology of current presentation is unclear.  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  Also previous admission considering nutritional deficiencies resulting in weakness and decreased ability to ambulate.  I have engaged neurosurgery for recommendations.  Chest CT findings of unclear etiology.  Demonstrating bilateral infiltrates however no other clinical signs of infection.  Normal white count, no fevers.  7/13: Fungitell positive.  Pulmonology engaged for comment.   Assessment & Plan:   Principal Problem:   Lower extremity weakness Active Problems:   Meningioma  (HCC)   Bilateral leg weakness   Thrombocytopenia (HCC)   Essential hypertension, benign   OSA (obstructive sleep apnea)   Pre-diabetes   Gastroesophageal reflux disease without esophagitis   PNA (pneumonia)   Acute respiratory failure with hypoxia (HCC)   Inability to ambulate due to multiple joints  Bilateral lower extremity weakness Unclear etiology.  History of leg weakness progressive over 6 weeks.  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  MRI lumbar and thoracic spine with evidence of lucencies concerning for possible metastatic disease. Seen in consultation by neurosurgery.  No neurosurgical intervention warranted.  Seen on previous admission by neurology.  No neurologic etiology.  Suspect nutritional etiology.  Patient eating less than 25% of her meals. Plan: Continue frequent ambulation.  PT OT as tolerated.  Optimize nutritional status.  Plan for skilled nursing facility at time of discharge.  Patient medically ready for discharge at this time.  Meningioma Lumbar spinal lucency Patient follows with [facility] neurology.  Receiving RT with Dr. Aggie Cosier. Inpatient oncology consulted.  Plan for outpatient PET scan for better characterization.  Discussed with IR.  Lesions not amenable to image guided biopsy.  Oncology aware.  Will arrange for outpatient PET  Liver lesions, ruled out CT abdomen pelvis did not demonstrate any liver lesions  Thrombocytopenia Appears chronic and asymptomatic  Essential hypertension Blood pressure soft.  Home diltiazem and Benicar on hold  Obstructive sleep apnea Goes for CPAP  Acute respiratory failure with hypoxia Ruled out Patient on room air  Possible pneumonia, ruled out Unclear etiology.  Findings on CT suspicious for possible COVID Procalcitonin negative.  No fevers.  No  elevated white count COVID swab negative Suspicion low for bacterial infection RVP negative Pulmonology engaged for, and 7/13 Positive  Fungitell Plan: Monitor off antibiotics Negative COVID, RVP, Legionella, strep pneumo Fungitell positive Fungal respiratory panel since Will likely need outpatient follow-up with pulmonology for further discussion and management.  Case discussed with Dr. Karna Christmas  Bilateral interstitial/groundglass opacities Unclear etiology.  Fungitell positive. Plan: Pulmonology engaged for comment.   Recommendations appreciated.   Case discussed with Dr. Karna Christmas  GERD PPI   DVT prophylaxis: SQ heparin Code Status: Full Family Communication:None today Disposition Plan: Status is: Inpatient Remains inpatient appropriate because: Unsafe discharge plan.  Will need skilled nursing facility.  Medically appropriate for discharge to SNF   Level of care: Med-Surg  Consultants:  Neurosurgery  Procedures:  None  Antimicrobials:   Subjective: Seen and examined.  Resting in bed.  Reports mobility is improving.  Objective: Vitals:   12/12/22 0009 12/12/22 0834 12/12/22 2316 12/13/22 0746  BP: 106/63 113/65 116/62 (!) 108/53  Pulse: 71 60 68 61  Resp: 18 15 18 16   Temp: 99.1 F (37.3 C) 97.9 F (36.6 C) 98.8 F (37.1 C) 98.3 F (36.8 C)  TempSrc:      SpO2: 98% 99% 100% 95%  Weight:      Height:       No intake or output data in the 24 hours ending 12/13/22 1101  Filed Weights   12/08/22 1726  Weight: 82.6 kg    Examination:  General exam: No acute distress.  Appears fatigued Respiratory system: Scattered crackles in upper lobes bilaterally.  Normal work of breathing.  2 L Cardiovascular system: S1-S2, RRR, no murmurs, no pedal edema Gastrointestinal system: Soft, NT/ND, normal bowel sounds Central nervous system: Alert and oriented. No focal neurological deficits. Extremities: Decreased power bilateral lower extremities.  Gait not assessed Skin: No rashes, lesions or ulcers Psychiatry: Judgement and insight appear normal. Mood & affect appropriate.     Data  Reviewed: I have personally reviewed following labs and imaging studies  CBC: Recent Labs  Lab 12/08/22 1733 12/09/22 0213  WBC 5.7 5.4  HGB 11.8* 10.1*  HCT 34.8* 30.7*  MCV 87.4 89.5  PLT 136* 136*   Basic Metabolic Panel: Recent Labs  Lab 12/08/22 1733 12/09/22 0213 12/10/22 0517  NA 137  --   --   K 3.7  --   --   CL 103  --   --   CO2 23  --   --   GLUCOSE 112*  --   --   BUN 12  --   --   CREATININE 0.63 0.65 0.52  CALCIUM 8.3*  --   --    GFR: Estimated Creatinine Clearance: 60.5 mL/min (by C-G formula based on SCr of 0.52 mg/dL). Liver Function Tests: Recent Labs  Lab 12/08/22 1733  AST 38  ALT 39  ALKPHOS 58  BILITOT 0.7  PROT 5.8*  ALBUMIN 2.5*   No results for input(s): "LIPASE", "AMYLASE" in the last 168 hours. No results for input(s): "AMMONIA" in the last 168 hours. Coagulation Profile: Recent Labs  Lab 12/09/22 0653  INR 1.2   Cardiac Enzymes: Recent Labs  Lab 12/08/22 1733  CKTOTAL 39   BNP (last 3 results) No results for input(s): "PROBNP" in the last 8760 hours. HbA1C: No results for input(s): "HGBA1C" in the last 72 hours. CBG: Recent Labs  Lab 12/12/22 0813 12/12/22 1235 12/12/22 1758 12/12/22 2103 12/13/22 0807  GLUCAP 81 151* 123* 102* 77  Lipid Profile: No results for input(s): "CHOL", "HDL", "LDLCALC", "TRIG", "CHOLHDL", "LDLDIRECT" in the last 72 hours. Thyroid Function Tests: No results for input(s): "TSH", "T4TOTAL", "FREET4", "T3FREE", "THYROIDAB" in the last 72 hours.  Anemia Panel: No results for input(s): "VITAMINB12", "FOLATE", "FERRITIN", "TIBC", "IRON", "RETICCTPCT" in the last 72 hours. Sepsis Labs: Recent Labs  Lab 12/08/22 1733 12/09/22 0213 12/09/22 0653  PROCALCITON 0.20  --   --   LATICACIDVEN  --  0.9 1.4    Recent Results (from the past 240 hour(s))  Blood culture (single)     Status: None (Preliminary result)   Collection Time: 12/09/22 12:18 AM   Specimen: BLOOD  Result Value Ref  Range Status   Specimen Description BLOOD BLOOD RIGHT ARM  Final   Special Requests   Final    BOTTLES DRAWN AEROBIC AND ANAEROBIC Blood Culture results may not be optimal due to an excessive volume of blood received in culture bottles   Culture   Final    NO GROWTH 4 DAYS Performed at [facility], [address], [place], [place] [address]    Report Status PENDING  Incomplete  SARS Coronavirus 2 by RT PCR (hospital order, performed in [facility] hospital lab) *cepheid single result test* Anterior Nasal Swab     Status: None   Collection Time: 12/09/22 12:18 AM   Specimen: Anterior Nasal Swab  Result Value Ref Range Status   SARS Coronavirus 2 by RT PCR NEGATIVE NEGATIVE Final    Comment: (NOTE) SARS-CoV-2 target nucleic acids are NOT DETECTED.  The SARS-CoV-2 RNA is generally detectable in upper and lower respiratory specimens during the acute phase of infection. The lowest concentration of SARS-CoV-2 viral copies this assay can detect is 250 copies / mL. A negative result does not preclude SARS-CoV-2 infection and should not be used as the sole basis for treatment or other patient management decisions.  A negative result may occur with improper specimen collection / handling, submission of specimen other than nasopharyngeal swab, presence of viral mutation(s) within the areas targeted by this assay, and inadequate number of viral copies (<250 copies / mL). A negative result must be combined with clinical observations, patient history, and epidemiological information.  Fact Sheet for Patients:   RoadLapTop.co.za  Fact Sheet for Healthcare Providers: http://kim-miller.com/  This test is not yet approved or  cleared by the [place] FDA and has been authorized for detection and/or diagnosis of SARS-CoV-2 by FDA under an Emergency Use Authorization (EUA).  This EUA will remain in effect (meaning this test can be used) for  the duration of the COVID-19 declaration under Section 564(b)(1) of the Act, 21 U.S.C. section 360bbb-3(b)(1), unless the authorization is terminated or revoked sooner.  Performed at [facility], [address] [place], [place] [address]   MRSA Next Gen by PCR, Nasal     Status: None   Collection Time: 12/09/22  2:31 PM   Specimen: Urine, Clean Catch; Nasal Swab  Result Value Ref Range Status   MRSA by PCR Next Gen NOT DETECTED NOT DETECTED Final    Comment: (NOTE) The GeneXpert MRSA Assay (FDA approved for NASAL specimens only), is one component of a comprehensive MRSA colonization surveillance program. It is not intended to diagnose MRSA infection nor to guide or monitor treatment for MRSA infections. Test performance is not FDA approved in patients less than 41 years old. Performed at [facility], [address] [place], [place] [address]   Respiratory (~20 pathogens) panel  by PCR     Status: None   Collection Time: 12/09/22  4:14 PM   Specimen: Nasopharyngeal Swab; Respiratory  Result Value Ref Range Status   Adenovirus NOT DETECTED NOT DETECTED Final   Coronavirus 229E NOT DETECTED NOT DETECTED Final    Comment: (NOTE) The Coronavirus on the Respiratory Panel, DOES NOT test for the novel  Coronavirus (2019 nCoV)    Coronavirus HKU1 NOT DETECTED NOT DETECTED Final   Coronavirus NL63 NOT DETECTED NOT DETECTED Final   Coronavirus OC43 NOT DETECTED NOT DETECTED Final   Metapneumovirus NOT DETECTED NOT DETECTED Final   Rhinovirus / Enterovirus NOT DETECTED NOT DETECTED Final   Influenza A NOT DETECTED NOT DETECTED Final   Influenza B NOT DETECTED NOT DETECTED Final   Parainfluenza Virus 1 NOT DETECTED NOT DETECTED Final   Parainfluenza Virus 2 NOT DETECTED NOT DETECTED Final   Parainfluenza Virus 3 NOT DETECTED NOT DETECTED Final   Parainfluenza Virus 4 NOT DETECTED NOT DETECTED Final   Respiratory Syncytial Virus NOT DETECTED NOT DETECTED Final    Bordetella pertussis NOT DETECTED NOT DETECTED Final   Bordetella Parapertussis NOT DETECTED NOT DETECTED Final   Chlamydophila pneumoniae NOT DETECTED NOT DETECTED Final   Mycoplasma pneumoniae NOT DETECTED NOT DETECTED Final    Comment: Performed at [facility], [address] [place], [place] [address]         Radiology Studies: No results found.      Scheduled Meds:  aspirin EC  81 mg Oral Daily   cholecalciferol  1,000 Units Oral Daily   heparin  5,000 Units Subcutaneous Q8H   insulin aspart  0-15 Units Subcutaneous TID WC   latanoprost  1 drop Right Eye QHS   mirtazapine  15 mg Oral QHS   multivitamin with minerals  1 tablet Oral Daily   pantoprazole  40 mg Oral Daily   rosuvastatin  10 mg Oral Daily   sucralfate  1 g Oral TID WC & HS   Continuous Infusions:     LOS: 3 days      Tresa Moore, MD [facility]   If 7PM-7AM, please contact night-coverage  12/13/2022, 11:01 AM

## 2022-12-14 DIAGNOSIS — R29898 Other symptoms and signs involving the musculoskeletal system: Secondary | ICD-10-CM | POA: Diagnosis not present

## 2022-12-14 LAB — CBC WITH DIFFERENTIAL/PLATELET
Abs Immature Granulocytes: 0.39 10*3/uL — ABNORMAL HIGH (ref 0.00–0.07)
Basophils Absolute: 0 10*3/uL (ref 0.0–0.1)
Basophils Relative: 1 %
Eosinophils Absolute: 0 10*3/uL (ref 0.0–0.5)
Eosinophils Relative: 1 %
HCT: 32.9 % — ABNORMAL LOW (ref 36.0–46.0)
Hemoglobin: 10.7 g/dL — ABNORMAL LOW (ref 12.0–15.0)
Immature Granulocytes: 9 %
Lymphocytes Relative: 22 %
Lymphs Abs: 0.9 10*3/uL (ref 0.7–4.0)
MCH: 29.2 pg (ref 26.0–34.0)
MCHC: 32.5 g/dL (ref 30.0–36.0)
MCV: 89.6 fL (ref 80.0–100.0)
Monocytes Absolute: 0.2 10*3/uL (ref 0.1–1.0)
Monocytes Relative: 3 %
Neutro Abs: 2.9 10*3/uL (ref 1.7–7.7)
Neutrophils Relative %: 64 %
Platelets: 397 10*3/uL (ref 150–400)
RBC: 3.67 MIL/uL — ABNORMAL LOW (ref 3.87–5.11)
RDW: 15.2 % (ref 11.5–15.5)
Smear Review: NORMAL
WBC: 4.4 10*3/uL (ref 4.0–10.5)
nRBC: 0.9 % — ABNORMAL HIGH (ref 0.0–0.2)

## 2022-12-14 LAB — GLUCOSE, CAPILLARY
Glucose-Capillary: 86 mg/dL (ref 70–99)
Glucose-Capillary: 189 mg/dL — ABNORMAL HIGH (ref 70–99)
Glucose-Capillary: 128 mg/dL — ABNORMAL HIGH (ref 70–99)
Glucose-Capillary: 142 mg/dL — ABNORMAL HIGH (ref 70–99)

## 2022-12-14 LAB — CULTURE, BLOOD (SINGLE): Culture: NO GROWTH

## 2022-12-14 NOTE — Progress Notes (Signed)
PROGRESS NOTE    Vickie Stewart  ZOX:[id number] DOB: [birth year] DOA: 12/08/2022 PCP: Alba Cory, MD    Brief Narrative:  77 y.o. female with medical history significant for meningioma of the right parafalcine region s/p IMRT, hypertension, hyperlipidemia, reflux esophagitis,  Coming to [place] bilateral leg weakness.  It originally started on the 7/5 and patient had an MRI of the brain with and without contrast.  Patient was recently discharged on 6 July.  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  Patient is no longer on steroids per discharge summary.  Per PT note and chart review patient needs A little help with walking and/or transfers;Help with stairs or ramp for entrance;Assist for transportation.  Patient today also complains of generalized weakness.  States that she lives with her brother who is her healthcare power of attorney and has been helping at home.  Patient is a limited historian otherwise she does not report any shortness of breath but is found to be hypoxic with O2 sats of 89% in no distress.  Initial vitals also show blood pressure of 107/57 heart rate of 99 respirations of 18.  CTA chest showed bilateral infiltrates no pulmonary embolism.  Exam is otherwise normal except that patient is extremely weak.  Procalcitonin is negative.  Normal white count.   Etiology of current presentation is unclear.  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  Also previous admission considering nutritional deficiencies resulting in weakness and decreased ability to ambulate.  I have engaged neurosurgery for recommendations.  Chest CT findings of unclear etiology.  Demonstrating bilateral infiltrates however no other clinical signs of infection.  Normal white count, no fevers.  7/13: Fungitell positive.  Pulmonology engaged for comment.   Assessment & Plan:   Principal Problem:   Lower extremity weakness Active Problems:   Meningioma  (HCC)   Bilateral leg weakness   Thrombocytopenia (HCC)   Essential hypertension, benign   OSA (obstructive sleep apnea)   Pre-diabetes   Gastroesophageal reflux disease without esophagitis   PNA (pneumonia)   Acute respiratory failure with hypoxia (HCC)   Inability to ambulate due to multiple joints  Bilateral lower extremity weakness Unclear etiology.  History of leg weakness progressive over 6 weeks.  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  MRI lumbar and thoracic spine with evidence of lucencies concerning for possible metastatic disease. Seen in consultation by neurosurgery.  No neurosurgical intervention warranted.  Seen on previous admission by neurology.  No neurologic etiology.  Suspect nutritional etiology.  Patient eating less than 25% of her meals. Plan: Continue frequent ambulation.   PT OT as tolerated.   Optimize nutritional status.   Plan for skilled nursing facility at time of discharge.   Patient medically ready for discharge at this time. TOC working on placement  Meningioma Lumbar spinal lucency Patient follows with [facility] neurology.  Receiving RT with Dr. Aggie Cosier. Inpatient oncology consulted.  Plan for outpatient PET scan for better characterization.  Discussed with IR.  Lesions not amenable to image guided biopsy.  Oncology aware.  Will arrange for outpatient PET  Liver lesions, ruled out CT abdomen pelvis did not demonstrate any liver lesions  Thrombocytopenia Appears chronic and asymptomatic  Essential hypertension Blood pressure soft.  Home diltiazem and Benicar on hold  Obstructive sleep apnea Goes for CPAP  Acute respiratory failure with hypoxia Ruled out Patient on room air  Possible pneumonia, ruled out Unclear etiology.  Findings on CT suspicious for possible  COVID Procalcitonin negative.  No fevers.  No elevated white count COVID swab negative Suspicion low for bacterial infection RVP negative Pulmonology engaged  for, and 7/13 Positive Fungitell Plan: Monitor off antibiotics Negative COVID, RVP, Legionella, strep pneumo Fungitell positive Fungal respiratory panel sent Will likely need outpatient follow-up with pulmonology for further discussion and management.  Case discussed with Dr. Karna Christmas  Bilateral interstitial/groundglass opacities Unclear etiology.  Fungitell positive. Plan: Pulmonology engaged for comment.   Recommendations appreciated.   Case discussed with Dr. Karna Christmas  GERD PPI   DVT prophylaxis: SQ heparin Code Status: Full Family Communication:None today Disposition Plan: Status is: Inpatient Remains inpatient appropriate because: Unsafe discharge plan.  Will need skilled nursing facility.  Medically appropriate for discharge to SNF   Level of care: Med-Surg  Consultants:  Neurosurgery  Procedures:  None  Antimicrobials:   Subjective: Seen and examined.  Resting in bed.  Reports mobility is improving.  Objective: Vitals:   12/13/22 0746 12/13/22 1648 12/13/22 2310 12/14/22 0752  BP: (!) 108/53 104/69 125/70 120/84  Pulse: 61 97 78 66  Resp: 16 19 20 17   Temp: 98.3 F (36.8 C) 97.9 F (36.6 C) 98.4 F (36.9 C) 98 F (36.7 C)  TempSrc:      SpO2: 95% 97% 100% 97%  Weight:      Height:        Intake/Output Summary (Last 24 hours) at 12/14/2022 1119 Last data filed at 12/14/2022 1038 Gross per 24 hour  Intake 120 ml  Output --  Net 120 ml    Filed Weights   12/08/22 1726  Weight: 82.6 kg    Examination:  General exam: NAD.  Fatigued Respiratory system: Crackles in bilateral upper lobes.  Normal work of breathing.  Room air Cardiovascular system: S1-S2, RRR, no murmurs, no pedal edema Gastrointestinal system: Soft, NT/ND, normal bowel sounds Central nervous system: Alert and oriented. No focal neurological deficits. Extremities: Decreased power bilateral lower extremities.  Gait not assessed Skin: No rashes, lesions or ulcers Psychiatry:  Judgement and insight appear normal. Mood & affect appropriate.     Data Reviewed: I have personally reviewed following labs and imaging studies  CBC: Recent Labs  Lab 12/08/22 1733 12/09/22 0213  WBC 5.7 5.4  HGB 11.8* 10.1*  HCT 34.8* 30.7*  MCV 87.4 89.5  PLT 136* 136*   Basic Metabolic Panel: Recent Labs  Lab 12/08/22 1733 12/09/22 0213 12/10/22 0517  NA 137  --   --   K 3.7  --   --   CL 103  --   --   CO2 23  --   --   GLUCOSE 112*  --   --   BUN 12  --   --   CREATININE 0.63 0.65 0.52  CALCIUM 8.3*  --   --    GFR: Estimated Creatinine Clearance: 60.5 mL/min (by C-G formula based on SCr of 0.52 mg/dL). Liver Function Tests: Recent Labs  Lab 12/08/22 1733  AST 38  ALT 39  ALKPHOS 58  BILITOT 0.7  PROT 5.8*  ALBUMIN 2.5*   No results for input(s): "LIPASE", "AMYLASE" in the last 168 hours. No results for input(s): "AMMONIA" in the last 168 hours. Coagulation Profile: Recent Labs  Lab 12/09/22 0653  INR 1.2   Cardiac Enzymes: Recent Labs  Lab 12/08/22 1733  CKTOTAL 39   BNP (last 3 results) No results for input(s): "PROBNP" in the last 8760 hours. HbA1C: No results for input(s): "HGBA1C" in the last  72 hours. CBG: Recent Labs  Lab 12/13/22 0807 12/13/22 1120 12/13/22 1703 12/13/22 2136 12/14/22 0742  GLUCAP 77 152* 133* 136* 86   Lipid Profile: No results for input(s): "CHOL", "HDL", "LDLCALC", "TRIG", "CHOLHDL", "LDLDIRECT" in the last 72 hours. Thyroid Function Tests: No results for input(s): "TSH", "T4TOTAL", "FREET4", "T3FREE", "THYROIDAB" in the last 72 hours.  Anemia Panel: No results for input(s): "VITAMINB12", "FOLATE", "FERRITIN", "TIBC", "IRON", "RETICCTPCT" in the last 72 hours. Sepsis Labs: Recent Labs  Lab 12/08/22 1733 12/09/22 0213 12/09/22 0653  PROCALCITON 0.20  --   --   LATICACIDVEN  --  0.9 1.4    Recent Results (from the past 240 hour(s))  Blood culture (single)     Status: None   Collection Time:  12/09/22 12:18 AM   Specimen: BLOOD  Result Value Ref Range Status   Specimen Description BLOOD BLOOD RIGHT ARM  Final   Special Requests   Final    BOTTLES DRAWN AEROBIC AND ANAEROBIC Blood Culture results may not be optimal due to an excessive volume of blood received in culture bottles   Culture   Final    NO GROWTH 5 DAYS Performed at [facility], [address], [place], [place] [address]    Report Status 12/14/2022 FINAL  Final  SARS Coronavirus 2 by RT PCR (hospital order, performed in [facility] hospital lab) *cepheid single result test* Anterior Nasal Swab     Status: None   Collection Time: 12/09/22 12:18 AM   Specimen: Anterior Nasal Swab  Result Value Ref Range Status   SARS Coronavirus 2 by RT PCR NEGATIVE NEGATIVE Final    Comment: (NOTE) SARS-CoV-2 target nucleic acids are NOT DETECTED.  The SARS-CoV-2 RNA is generally detectable in upper and lower respiratory specimens during the acute phase of infection. The lowest concentration of SARS-CoV-2 viral copies this assay can detect is 250 copies / mL. A negative result does not preclude SARS-CoV-2 infection and should not be used as the sole basis for treatment or other patient management decisions.  A negative result may occur with improper specimen collection / handling, submission of specimen other than nasopharyngeal swab, presence of viral mutation(s) within the areas targeted by this assay, and inadequate number of viral copies (<250 copies / mL). A negative result must be combined with clinical observations, patient history, and epidemiological information.  Fact Sheet for Patients:   RoadLapTop.co.za  Fact Sheet for Healthcare Providers: http://kim-miller.com/  This test is not yet approved or  cleared by the [place] FDA and has been authorized for detection and/or diagnosis of SARS-CoV-2 by FDA under an Emergency Use Authorization (EUA).  This  EUA will remain in effect (meaning this test can be used) for the duration of the COVID-19 declaration under Section 564(b)(1) of the Act, 21 U.S.C. section 360bbb-3(b)(1), unless the authorization is terminated or revoked sooner.  Performed at [facility], [address] [place], [place] [address]   MRSA Next Gen by PCR, Nasal     Status: None   Collection Time: 12/09/22  2:31 PM   Specimen: Urine, Clean Catch; Nasal Swab  Result Value Ref Range Status   MRSA by PCR Next Gen NOT DETECTED NOT DETECTED Final    Comment: (NOTE) The GeneXpert MRSA Assay (FDA approved for NASAL specimens only), is one component of a comprehensive MRSA colonization surveillance program. It is not intended to diagnose MRSA infection nor to guide or monitor treatment for MRSA infections. Test performance is not FDA approved  in patients less than 46 years old. Performed at [facility], [address] [place], [place] [address]   Respiratory (~20 pathogens) panel by PCR     Status: None   Collection Time: 12/09/22  4:14 PM   Specimen: Nasopharyngeal Swab; Respiratory  Result Value Ref Range Status   Adenovirus NOT DETECTED NOT DETECTED Final   Coronavirus 229E NOT DETECTED NOT DETECTED Final    Comment: (NOTE) The Coronavirus on the Respiratory Panel, DOES NOT test for the novel  Coronavirus (2019 nCoV)    Coronavirus HKU1 NOT DETECTED NOT DETECTED Final   Coronavirus NL63 NOT DETECTED NOT DETECTED Final   Coronavirus OC43 NOT DETECTED NOT DETECTED Final   Metapneumovirus NOT DETECTED NOT DETECTED Final   Rhinovirus / Enterovirus NOT DETECTED NOT DETECTED Final   Influenza A NOT DETECTED NOT DETECTED Final   Influenza B NOT DETECTED NOT DETECTED Final   Parainfluenza Virus 1 NOT DETECTED NOT DETECTED Final   Parainfluenza Virus 2 NOT DETECTED NOT DETECTED Final   Parainfluenza Virus 3 NOT DETECTED NOT DETECTED Final   Parainfluenza Virus 4 NOT DETECTED NOT DETECTED Final    Respiratory Syncytial Virus NOT DETECTED NOT DETECTED Final   Bordetella pertussis NOT DETECTED NOT DETECTED Final   Bordetella Parapertussis NOT DETECTED NOT DETECTED Final   Chlamydophila pneumoniae NOT DETECTED NOT DETECTED Final   Mycoplasma pneumoniae NOT DETECTED NOT DETECTED Final    Comment: Performed at [facility], [address] [place], [place] [address]         Radiology Studies: No results found.      Scheduled Meds:  aspirin EC  81 mg Oral Daily   cholecalciferol  1,000 Units Oral Daily   heparin  5,000 Units Subcutaneous Q8H   insulin aspart  0-15 Units Subcutaneous TID WC   latanoprost  1 drop Right Eye QHS   mirtazapine  15 mg Oral QHS   multivitamin with minerals  1 tablet Oral Daily   pantoprazole  40 mg Oral Daily   rosuvastatin  10 mg Oral Daily   sucralfate  1 g Oral TID WC & HS   Continuous Infusions:     LOS: 4 days      Tresa Moore, MD [facility]   If 7PM-7AM, please contact night-coverage  12/14/2022, 11:19 AM

## 2022-12-14 NOTE — TOC Progression Note (Signed)
Transition of Care [facility]) - Progression Note    Patient Details  Name: Vickie Stewart MRN: [id number] Date of Birth: 26-Mar-1946  [facility] CM/SW Contact  Marlowe Sax, RN Phone Number: 12/14/2022, 4:27 PM  Clinical Narrative:     [facility] made a bed offer, accepted, still PASSR pending  Expected Discharge Plan: Skilled Nursing Facility Barriers to Discharge: No Barriers Identified  Expected Discharge Plan and Services   Discharge Planning Services: CM Consult Post Acute Care Choice: Skilled Nursing Facility Living arrangements for the past 2 months: Single Family Home                                       Social Determinants of Health (SDOH) Interventions SDOH Screenings   Food Insecurity: No Food Insecurity (12/09/2022)  Housing: Low Risk  (12/09/2022)  Transportation Needs: No Transportation Needs (12/09/2022)  Utilities: Not At Risk (12/09/2022)  Alcohol Screen: Low Risk  (09/03/2021)  Depression (PHQ2-9): Low Risk  (09/28/2022)  Financial Resource Strain: Low Risk  (09/03/2021)  Physical Activity: Sufficiently Active (09/03/2021)  Social Connections: Moderately Integrated (09/03/2021)  Stress: No Stress Concern Present (09/03/2021)  Tobacco Use: Low Risk  (12/09/2022)    Readmission Risk Interventions    12/11/2022    3:34 PM  Readmission Risk Prevention Plan  Post Dischage Appt Complete  Medication Screening Complete  Transportation Screening Complete

## 2022-12-14 NOTE — TOC Progression Note (Signed)
Transition of Care [facility]) - Progression Note    Patient Details  Name: Vickie Stewart MRN: [id number] Date of Birth: 1946-03-15  Transition of Care [facility]) CM/SW Contact  Marlowe Sax, RN Phone Number: 12/14/2022, 4:07 PM  Clinical Narrative:    Spoke with the patient and explained that we are still waiting on a PASSR number, she stated that she really would like to go to [facility], I explained that they have not made a bed offer yet, I sent back out to all pending facilities asking to review for a bed offer   Expected Discharge Plan: Skilled Nursing Facility Barriers to Discharge: No Barriers Identified  Expected Discharge Plan and Services   Discharge Planning Services: CM Consult Post Acute Care Choice: Skilled Nursing Facility Living arrangements for the past 2 months: Single Family Home                                       Social Determinants of Health (SDOH) Interventions SDOH Screenings   Food Insecurity: No Food Insecurity (12/09/2022)  Housing: Low Risk  (12/09/2022)  Transportation Needs: No Transportation Needs (12/09/2022)  Utilities: Not At Risk (12/09/2022)  Alcohol Screen: Low Risk  (09/03/2021)  Depression (PHQ2-9): Low Risk  (09/28/2022)  Financial Resource Strain: Low Risk  (09/03/2021)  Physical Activity: Sufficiently Active (09/03/2021)  Social Connections: Moderately Integrated (09/03/2021)  Stress: No Stress Concern Present (09/03/2021)  Tobacco Use: Low Risk  (12/09/2022)    Readmission Risk Interventions    12/11/2022    3:34 PM  Readmission Risk Prevention Plan  Post Dischage Appt Complete  Medication Screening Complete  Transportation Screening Complete

## 2022-12-14 NOTE — Telephone Encounter (Signed)
I have placed the order for Brain MRI to be done in 6 months.   Please let her know that when we get closer to January we will get this sent over to the hospital and get this scheduled.

## 2022-12-14 NOTE — Progress Notes (Signed)
Occupational Therapy Treatment Patient Details Name: Vickie Stewart MRN: [id number] DOB: January 23, 1946 Today's Date: 12/14/2022   History of present illness Pt is a 77 y/o F admitted on 12/08/22 after presenting with c/o BLE weakness. MRI showed "small enhancing lesions in the right posterior elements at T6 & T10, as well as possibly at L1." PMH: meningioma of the right parafalcine region s/p IMRT, HTN, HLD, anxiety, glaucoma, obesity, vaginal CA, thoracic or lumbosacral neuritis or radiculitis   OT comments  Pt seen for OT treatment on this date. Upon arrival to room pt resting in room, agreeable to tx. Pt requires MIN A for bed mobility with tactile cueing for LE management and hand held support for trunk management. Pt was requesting to transfer to [facility]. MIN A sit<>stand with hand held assist for stability. Pt making progress toward goals, will continue to follow POC. Discharge recommendation remains appropriate.     Recommendations for follow up therapy are one component of a multi-disciplinary discharge planning process, led by the attending physician.  Recommendations may be updated based on patient status, additional functional criteria and insurance authorization.    Assistance Recommended at Discharge Intermittent Supervision/Assistance  Patient can return home with the following  A little help with walking and/or transfers;A little help with bathing/dressing/bathroom;Assistance with cooking/housework;Assist for transportation;Help with stairs or ramp for entrance   Equipment Recommendations  Other (comment) (defer to next venue of care)    Recommendations for Other Services      Precautions / Restrictions Precautions Precautions: Fall Restrictions Weight Bearing Restrictions: No       Mobility Bed Mobility Overal bed mobility: Needs Assistance Bed Mobility: Supine to Sit     Supine to sit: Min assist, HOB elevated     General bed mobility comments: Tactile cueing for  LE movement, Hand held assist for trunk management to sit at EOB.    Transfers Overall transfer level: Needs assistance Equipment used: Rolling walker (2 wheels), 1 person hand held assist Transfers: Bed to chair/wheelchair/BSC Sit to Stand: Min assist     Step pivot transfers: Min assist           Balance Overall balance assessment: Needs assistance Sitting-balance support: No upper extremity supported, Feet supported Sitting balance-Leahy Scale: Good     Standing balance support: During functional activity, Bilateral upper extremity supported, Single extremity supported, Reliant on assistive device for balance Standing balance-Leahy Scale: Fair Standing balance comment: Hand held assist for stability                           ADL either performed or assessed with clinical judgement   ADL Overall ADL's : Needs assistance/impaired                         Toilet Transfer: Minimal assistance;BSC/3in1 Toilet Transfer Details (indicate cue type and reason): MIN A with hand held assist sit<>stand. Pt took steps and sat down quickly.         Functional mobility during ADLs: Minimal assistance;Cueing for safety      Extremity/Trunk Assessment Upper Extremity Assessment Upper Extremity Assessment: Overall WFL for tasks assessed   Lower Extremity Assessment Lower Extremity Assessment: Defer to PT evaluation        Vision       Perception     Praxis      Cognition Arousal/Alertness: Awake/alert Behavior During Therapy: WFL for tasks assessed/performed Overall Cognitive Status: Within Functional Limits  for tasks assessed                                 General Comments: Presented with fear of falling and increased dependency from prior admission        Exercises      Shoulder Instructions       General Comments      Pertinent Vitals/ Pain       Pain Assessment Pain Assessment: No/denies pain  Home Living                                           Prior Functioning/Environment              Frequency  Min 1X/week        Progress Toward Goals  OT Goals(current goals can now be found in the care plan section)  Progress towards OT goals: Progressing toward goals  Acute Rehab OT Goals Patient Stated Goal: To feel better OT Goal Formulation: With patient Time For Goal Achievement: 12/23/22 Potential to Achieve Goals: Good ADL Goals Pt Will Perform Grooming: Independently;sitting Pt Will Perform Lower Body Dressing: sit to/from stand;with modified independence Pt Will Transfer to Toilet: with modified independence;ambulating;regular height toilet Pt Will Perform Tub/Shower Transfer: Shower transfer;grab bars;rolling walker;shower seat  Plan Discharge plan remains appropriate;Frequency remains appropriate    Co-evaluation                 AM-PAC OT "6 Clicks" Daily Activity     Outcome Measure   Help from another person eating meals?: None Help from another person taking care of personal grooming?: A Little Help from another person toileting, which includes using toliet, bedpan, or urinal?: A Little Help from another person bathing (including washing, rinsing, drying)?: A Little Help from another person to put on and taking off regular upper body clothing?: None Help from another person to put on and taking off regular lower body clothing?: A Little 6 Click Score: 20    End of Session Equipment Utilized During Treatment: Rolling walker (2 wheels)  OT Visit Diagnosis: Other abnormalities of gait and mobility (R26.89)   Activity Tolerance Patient tolerated treatment well   Patient Left Other (comment);with call bell/phone within reach (Nursing staff made aware)   Nurse Communication          Time: 8295-6213 OT Time Calculation (min): 9 min  Charges: OT General Charges $OT Visit: 1 Visit OT Treatments $Self Care/Home Management : 8-22 mins  Thresa Ross, OTS

## 2022-12-14 NOTE — Telephone Encounter (Signed)
Patient still admitted.

## 2022-12-14 NOTE — Plan of Care (Signed)

## 2022-12-14 NOTE — Consult Note (Signed)
PULMONOLOGY         Date: 12/14/2022,   MRN# [id number] Vickie Stewart December 11, 1945     AdmissionWeight: 82.6 kg                 CurrentWeight: 82.6 kg  Referring provider: Dr Georgeann Oppenheim    CHIEF COMPLAINT:   Recurrent lung infiltrates   HISTORY OF PRESENT ILLNESS   This is a 77 yo F with hx of meningioma, GERD, HTN who came in for leg weakness. She had brain imaging with no signs of stroke. 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 She was found to be hypoxemic on arrival to hospital. CTA showed no PE but +bilateral infiltrates.  She had viral workup done which was negative. PCCM for further evaluation and management.   12/14/22- patient seen and examined at bedside, she is on room air with stable vital signs.  CRP is markedly elevated.   12/15/22- patient reports feeling better, she's not wheezing as much as before.  She's been out of bed and  walked to bathroom, she was able to do it and then compelted PT activities also.  We discussed increased inflammatory biomarkers, patient declines steroids therapy. Thus far workup is negative. Ive asked patient to produce another resp specimen for micro.  Overall she's improved quite a bit.   PAST MEDICAL HISTORY   Past Medical History:  Diagnosis Date   Allergic rhinitis, cause unspecified    Anxiety state, unspecified    Contact dermatitis and other eczema, due to unspecified cause    Diffuse cystic mastopathy    Dyspepsia and other specified disorders of function of stomach    Essential hypertension, benign    Glaucoma    Irritable bowel syndrome    Lateral epicondylitis  of elbow    Leukocytopenia, unspecified    Mastodynia    Obesity, unspecified    Pure hypercholesterolemia    Reflux esophagitis    Thoracic or lumbosacral neuritis or radiculitis, unspecified    Unspecified disorder of skin and subcutaneous tissue    Vaginal cancer (HCC)    History      SURGICAL HISTORY   Past Surgical History:  Procedure Laterality Date   BREAST EXCISIONAL BIOPSY Left    neg   BREAST LUMPECTOMY Left    COLONOSCOPY  08/26/2006   COLONOSCOPY WITH PROPOFOL N/A 10/21/2016   Procedure: COLONOSCOPY WITH PROPOFOL;  Surgeon: Kieth Brightly, MD;  Location: [facility] ENDOSCOPY;  Service: Endoscopy;  Laterality: N/A;   ESOPHAGOGASTRODUODENOSCOPY (EGD) WITH PROPOFOL N/A 02/07/2020   Procedure: ESOPHAGOGASTRODUODENOSCOPY (EGD) WITH PROPOFOL;  Surgeon: Toney Reil, MD;  Location: [facility] ENDOSCOPY;  Service: Gastroenterology;  Laterality: N/A;     FAMILY HISTORY   Family History  Problem Relation Age of Onset   Pulmonary embolism Mother    Hypertension Father    Aortic stenosis Father    Breast cancer Neg Hx      SOCIAL HISTORY   Social History   Tobacco Use   Smoking status: Never   Smokeless tobacco: Never   Tobacco comments:    experimented with dip snuff as a child  Vaping Use   Vaping status: Never Used  Substance Use Topics   Alcohol use: No   Drug use: No     MEDICATIONS    Home Medication:    Current Medication:  Current Facility-Administered Medications:    aspirin EC tablet 81 mg, 81 mg, Oral, Daily, Allena Katz,  Eliezer Mccoy, MD, 81 mg at 12/14/22 0914   cholecalciferol (VITAMIN D3) 25 MCG (1000 UNIT) tablet 1,000 Units, 1,000 Units, Oral, Daily, Gertha Calkin, MD, 1,000 Units at 12/14/22 0914   heparin injection 5,000 Units, 5,000 Units, Subcutaneous, Q8H, Gertha Calkin, MD, 5,000 Units at 12/14/22 0558   insulin aspart (novoLOG) injection 0-15 Units, 0-15 Units, Subcutaneous, TID WC, Gertha Calkin, MD, 2 Units at 12/13/22 1712   latanoprost (XALATAN) 0.005 % ophthalmic solution 1 drop, 1 drop, Right Eye, QHS, Sreenath, Sudheer B, MD, 1 drop at 12/13/22 2054   mirtazapine (REMERON) tablet 15 mg, 15 mg, Oral, QHS, Sreenath, Sudheer B, MD, 15 mg at 12/13/22 2053   multivitamin with minerals tablet 1 tablet, 1 tablet, Oral,  Daily, Georgeann Oppenheim, Sudheer B, MD, 1 tablet at 12/14/22 0914   pantoprazole (PROTONIX) EC tablet 40 mg, 40 mg, Oral, Daily, Sreenath, Sudheer B, MD, 40 mg at 12/14/22 0914   phenol (CHLORASEPTIC) mouth spray 1 spray, 1 spray, Mouth/Throat, PRN, Sreenath, Sudheer B, MD   rosuvastatin (CRESTOR) tablet 10 mg, 10 mg, Oral, Daily, Sreenath, Sudheer B, MD, 10 mg at 12/14/22 0914   sucralfate (CARAFATE) tablet 1 g, 1 g, Oral, TID WC & HS, Sreenath, Sudheer B, MD, 1 g at 12/14/22 0743    ALLERGIES   Atorvastatin, Terbinafine and related, and Zetia [ezetimibe]     REVIEW OF SYSTEMS    Review of Systems:  Gen:  Denies  fever, sweats, chills weigh loss  HEENT: Denies blurred vision, double vision, ear pain, eye pain, hearing loss, nose bleeds, sore throat Cardiac:  No dizziness, chest pain or heaviness, chest tightness,edema Resp:   reports dyspnea chronically  Gi: Denies swallowing difficulty, stomach pain, nausea or vomiting, diarrhea, constipation, bowel incontinence Gu:  Denies bladder incontinence, burning urine Ext:   Denies Joint pain, stiffness or swelling Skin: Denies  skin rash, easy bruising or bleeding or hives Endoc:  Denies polyuria, polydipsia , polyphagia or weight change Psych:   Denies depression, insomnia or hallucinations   Other:  All other systems negative   VS: BP 120/84   Pulse 66   Temp 98 F (36.7 C)   Resp 17   Ht 5\' 2"  (1.575 m)   Wt 82.6 kg   SpO2 97%   BMI 33.29 kg/m      PHYSICAL EXAM    GENERAL:NAD, no fevers, chills, no weakness no fatigue HEAD: Normocephalic, atraumatic.  EYES: Pupils equal, round, reactive to light. Extraocular muscles intact. No scleral icterus.  MOUTH: Moist mucosal membrane. Dentition intact. No abscess noted.  EAR, NOSE, THROAT: Clear without exudates. No external lesions.  NECK: Supple. No thyromegaly. No nodules. No JVD.  PULMONARY: decreased breath sounds with mild rhonchi worse at bases bilaterally.   CARDIOVASCULAR: S1 and S2. Regular rate and rhythm. No murmurs, rubs, or gallops. No edema. Pedal pulses 2+ bilaterally.  GASTROINTESTINAL: Soft, nontender, nondistended. No masses. Positive bowel sounds. No hepatosplenomegaly.  MUSCULOSKELETAL: No swelling, clubbing, or edema. Range of motion full in all extremities.  NEUROLOGIC: Cranial nerves II through XII are intact. No gross focal neurological deficits. Sensation intact. Reflexes intact.  SKIN: No ulceration, lesions, rashes, or cyanosis. Skin warm and dry. Turgor intact.  PSYCHIATRIC: Mood, affect within normal limits. The patient is awake, alert and oriented x 3. Insight, judgment intact.       IMAGING   Reviewed CT chest    ASSESSMENT/PLAN   Acute hypoxemic respiratory failure - present on admission -need to rule  out active infection, currently patient on room air and without leukocytosis and does have normal vital signs, can perform below workup and review upon follow up in outpatient clinic.  - COVID19 negative  - supplemental O2 during my evaluation room air - will perform infectious workup for pneumonia -Respiratory viral panel-negative -serum fungitell-positive (note at times this  may be due to drug interaction such as antibiotics and lab error consider pharmacology consultation) -cryptococcal, blastomyces, galctomanan, histo urinary ag, aspergillus, fungal blood culture ordered -legionella ab-negative -strep pneumoniae ur AG-negative -Histoplasma Ur Ag-in process -sputum resp cultures-in process -AFB sputum expectorated specimen -reviewed pertinent imaging with patient today - ESRelevated, crp ordered -Patient received Radiation therapy x 30 dose as recent as June 2024 and likely does have some residual pneumonitis at this time. She appears to be sensitive to steroids so I would continue off steroids since she's on room air now.  -PT/OT for d/c planning  -please encourage patient to use incentive spirometer few  times each hour while hospitalized.           Thank you for allowing me to participate in the care of this patient.   Patient/Family are satisfied with care plan and all questions have been answered.    Provider disclosure: Patient with at least one acute or chronic illness or injury that poses a threat to life or bodily function and is being managed actively during this encounter.  All of the below services have been performed independently by signing provider:  review of prior documentation from internal and or external health records.  Review of previous and current lab results.  Interview and comprehensive assessment during patient visit today. Review of current and previous chest radiographs/CT scans. Discussion of management and test interpretation with health care team and patient/family.   This document was prepared using Dragon voice recognition software and may include unintentional dictation errors.     Vida Rigger, M.D.  Division of Pulmonary & Critical Care Medicine

## 2022-12-14 NOTE — Care Management Important Message (Signed)
Important Message  Patient Details  Name: Vickie Stewart MRN: [id number] Date of Birth: Jun 27, 1945   Medicare Important Message Given:  N/A - LOS <3 / Initial given by admissions     Olegario Messier A  12/14/2022, 9:25 AM

## 2022-12-14 NOTE — Progress Notes (Signed)
Physical Therapy Treatment Patient Details Name: Vickie Stewart MRN: [id number] DOB: 1945/07/21 Today's Date: 12/14/2022   History of Present Illness Pt is a 77 y/o F admitted on 12/08/22 after presenting with c/o BLE weakness. MRI showed "small enhancing lesions in the right posterior elements at T6 & T10, as well as possibly at L1." PMH: meningioma of the right parafalcine region s/p IMRT, HTN, HLD, anxiety, glaucoma, obesity, vaginal CA, thoracic or lumbosacral neuritis or radiculitis    PT Comments  Pt seen for PT tx with pt agreeable with encouragement. Pt requires min assist to complete bed mobility, min assist for STS, & min assist for safe gait in room/bathroom with RW. Pt presents with ongoing impaired gait pattern but more lateral sway to the R on this date. Pt limited by need to toilet for increased length of time. Pt requires encouragement to attempt peri hygiene & pt noted to have bright red blood in toilet - MD made aware. Continue to recommend ongoing PT services to progress mobility as able.    Assistance Recommended at Discharge Intermittent Supervision/Assistance  If plan is discharge home, recommend the following:  Can travel by private vehicle    A little help with walking and/or transfers;A little help with bathing/dressing/bathroom;Assistance with cooking/housework;Help with stairs or ramp for entrance;Assist for transportation   Yes  Equipment Recommendations  Rolling walker (2 wheels)    Recommendations for Other Services       Precautions / Restrictions Precautions Precautions: Fall Restrictions Weight Bearing Restrictions: No     Mobility  Bed Mobility Overal bed mobility: Needs Assistance Bed Mobility: Supine to Sit     Supine to sit: Min assist, HOB elevated     General bed mobility comments: PT provides cuing for use of bed rails, pt still has to hold to PT's hand to assist with pulling herself upright as pt unable to push.     Transfers Overall transfer level: Needs assistance Equipment used: Rolling walker (2 wheels) Transfers: Sit to/from Stand Sit to Stand: Min assist           General transfer comment: STS from EOB with posterior lean & LOB, requires min assist & cuing for anterior weight shifting. Pt is able to transfer STS from low toilet with RW & grab bar with CGA.    Ambulation/Gait Ambulation/Gait assistance: Min assist Gait Distance (Feet): 15 Feet Assistive device: Rolling walker (2 wheels) Gait Pattern/deviations: Decreased step length - right, Decreased step length - left, Decreased dorsiflexion - right, Decreased dorsiflexion - left, Decreased stride length, Shuffle Gait velocity: decreased     General Gait Details: Pt with increased lateral sway to the R, decreased foot clearance (shuffling feet), absent heel strike despite PT providing cuing for this.   Stairs             Wheelchair Mobility     Tilt Bed    Modified Rankin (Stroke Patients Only)       Balance Overall balance assessment: Needs assistance Sitting-balance support: No upper extremity supported, Feet supported Sitting balance-Leahy Scale: Good     Standing balance support: Bilateral upper extremity supported, During functional activity, Reliant on assistive device for balance Standing balance-Leahy Scale: Poor                              Cognition Arousal/Alertness: Awake/alert Behavior During Therapy: WFL for tasks assessed/performed Overall Cognitive Status: Within Functional Limits for tasks assessed  General Comments: Requires more encouragement to complete some functional tasks today as compared to last time this PT saw her.        Exercises      General Comments General comments (skin integrity, edema, etc.): Pt on 2L/min via nasal cannula throughout session, SpO2 sitting EOB 93%. Pt with continent BM on toilet but appears to be  all bright red blood - NT & MD notified.      Pertinent Vitals/Pain Pain Assessment Pain Assessment: Faces Faces Pain Scale: Hurts even more Pain Location: back Pain Descriptors / Indicators: Discomfort, Grimacing Pain Intervention(s): Monitored during session    Home Living                          Prior Function            PT Goals (current goals can now be found in the care plan section) Acute Rehab PT Goals Patient Stated Goal: to be able to walk before she goes home PT Goal Formulation: With patient Time For Goal Achievement: 12/23/22 Potential to Achieve Goals: Good Progress towards PT goals: Progressing toward goals    Frequency    Min 1X/week      PT Plan Current plan remains appropriate    Co-evaluation              AM-PAC PT "6 Clicks" Mobility   Outcome Measure  Help needed turning from your back to your side while in a flat bed without using bedrails?: A Little Help needed moving from lying on your back to sitting on the side of a flat bed without using bedrails?: A Little Help needed moving to and from a bed to a chair (including a wheelchair)?: A Little Help needed standing up from a chair using your arms (e.g., wheelchair or bedside chair)?: A Little Help needed to walk in hospital room?: A Little Help needed climbing 3-5 steps with a railing? : A Lot 6 Click Score: 17    End of Session Equipment Utilized During Treatment: Oxygen Activity Tolerance: Other (comment) (limited by toileting) Patient left:  (on toilet in handoff to NT) Nurse Communication:  (notified MD of bright red blood after pt toileted) PT Visit Diagnosis: Unsteadiness on feet (R26.81);Other abnormalities of gait and mobility (R26.89);Muscle weakness (generalized) (M62.81);Difficulty in walking, not elsewhere classified (R26.2);Pain Pain - part of body:  (back)     Time: 9147-8295 PT Time Calculation (min) (ACUTE ONLY): 27 min  Charges:    $Therapeutic  Activity: 23-37 mins PT General Charges $$ ACUTE PT VISIT: 1 Visit                     Aleda Grana, PT, DPT 12/14/22, 3:20 PM   Sandi Mariscal 12/14/2022, 3:19 PM

## 2022-12-15 ENCOUNTER — Other Ambulatory Visit: Payer: Self-pay | Admitting: *Deleted

## 2022-12-15 DIAGNOSIS — J9601 Acute respiratory failure with hypoxia: Secondary | ICD-10-CM | POA: Diagnosis not present

## 2022-12-15 DIAGNOSIS — D649 Anemia, unspecified: Secondary | ICD-10-CM | POA: Diagnosis not present

## 2022-12-15 DIAGNOSIS — M1612 Unilateral primary osteoarthritis, left hip: Secondary | ICD-10-CM | POA: Diagnosis not present

## 2022-12-15 DIAGNOSIS — D329 Benign neoplasm of meninges, unspecified: Secondary | ICD-10-CM | POA: Diagnosis not present

## 2022-12-15 DIAGNOSIS — Z743 Need for continuous supervision: Secondary | ICD-10-CM | POA: Diagnosis not present

## 2022-12-15 DIAGNOSIS — G4733 Obstructive sleep apnea (adult) (pediatric): Secondary | ICD-10-CM | POA: Diagnosis not present

## 2022-12-15 DIAGNOSIS — I959 Hypotension, unspecified: Secondary | ICD-10-CM | POA: Diagnosis not present

## 2022-12-15 DIAGNOSIS — R937 Abnormal findings on diagnostic imaging of other parts of musculoskeletal system: Secondary | ICD-10-CM

## 2022-12-15 DIAGNOSIS — D509 Iron deficiency anemia, unspecified: Secondary | ICD-10-CM | POA: Diagnosis not present

## 2022-12-15 DIAGNOSIS — I517 Cardiomegaly: Secondary | ICD-10-CM | POA: Diagnosis not present

## 2022-12-15 DIAGNOSIS — E440 Moderate protein-calorie malnutrition: Secondary | ICD-10-CM | POA: Diagnosis not present

## 2022-12-15 DIAGNOSIS — M6281 Muscle weakness (generalized): Secondary | ICD-10-CM | POA: Diagnosis not present

## 2022-12-15 DIAGNOSIS — R051 Acute cough: Secondary | ICD-10-CM | POA: Diagnosis not present

## 2022-12-15 DIAGNOSIS — R29898 Other symptoms and signs involving the musculoskeletal system: Secondary | ICD-10-CM | POA: Diagnosis not present

## 2022-12-15 DIAGNOSIS — J984 Other disorders of lung: Secondary | ICD-10-CM | POA: Diagnosis not present

## 2022-12-15 DIAGNOSIS — J189 Pneumonia, unspecified organism: Secondary | ICD-10-CM | POA: Diagnosis not present

## 2022-12-15 DIAGNOSIS — M85852 Other specified disorders of bone density and structure, left thigh: Secondary | ICD-10-CM | POA: Diagnosis not present

## 2022-12-15 DIAGNOSIS — D696 Thrombocytopenia, unspecified: Secondary | ICD-10-CM | POA: Diagnosis not present

## 2022-12-15 LAB — GLUCOSE, CAPILLARY
Glucose-Capillary: 92 mg/dL (ref 70–99)
Glucose-Capillary: 144 mg/dL — ABNORMAL HIGH (ref 70–99)

## 2022-12-15 LAB — CRYPTOCOCCUS ANTIGEN, SERUM: Cryptococcus Antigen, Serum: NEGATIVE

## 2022-12-15 LAB — IMMUNOGLOBULINS A/E/G/M, SERUM
IgA: 197 mg/dL (ref 64–422)
IgE (Immunoglobulin E), Serum: 6 IU/mL (ref 6–495)
IgG (Immunoglobin G), Serum: 351 mg/dL — ABNORMAL LOW (ref 586–1602)
IgM (Immunoglobulin M), Srm: 43 mg/dL (ref 26–217)

## 2022-12-15 MED ORDER — PANTOPRAZOLE SODIUM 40 MG PO TBEC
40.0000 mg | DELAYED_RELEASE_TABLET | Freq: Two times a day (BID) | ORAL | Status: DC
  Administered 2022-12-15: 40 mg via ORAL
  Filled 2022-12-15: qty 1

## 2022-12-15 MED ORDER — OMEPRAZOLE 20 MG PO CPDR
20.0000 mg | DELAYED_RELEASE_CAPSULE | Freq: Two times a day (BID) | ORAL | Status: DC
Start: 2022-12-15 — End: 2023-06-01

## 2022-12-15 MED ORDER — MIRTAZAPINE 15 MG PO TABS: 15.0000 mg | ORAL_TABLET | Freq: Every day | ORAL | Status: DC

## 2022-12-15 NOTE — TOC Progression Note (Signed)
Transition of Care [facility]) - Progression Note    Patient Details  Name: Vickie Stewart MRN: [id number] Date of Birth: 04/01/1946  [facility] CM/SW Contact  Marlowe Sax, RN Phone Number: 12/15/2022, 2:23 PM  Clinical Narrative:     Received Ins approval Auth ID [id number]  Expected Discharge Plan: Skilled Nursing Facility Barriers to Discharge: No Barriers Identified  Expected Discharge Plan and Services   Discharge Planning Services: CM Consult Post Acute Care Choice: Skilled Nursing Facility Living arrangements for the past 2 months: Single Family Home                                       Social Determinants of Health (SDOH) Interventions SDOH Screenings   Food Insecurity: No Food Insecurity (12/09/2022)  Housing: Low Risk  (12/09/2022)  Transportation Needs: No Transportation Needs (12/09/2022)  Utilities: Not At Risk (12/09/2022)  Alcohol Screen: Low Risk  (09/03/2021)  Depression (PHQ2-9): Low Risk  (09/28/2022)  Financial Resource Strain: Low Risk  (09/03/2021)  Physical Activity: Sufficiently Active (09/03/2021)  Social Connections: Moderately Integrated (09/03/2021)  Stress: No Stress Concern Present (09/03/2021)  Tobacco Use: Low Risk  (12/09/2022)    Readmission Risk Interventions    12/11/2022    3:34 PM  Readmission Risk Prevention Plan  Post Dischage Appt Complete  Medication Screening Complete  Transportation Screening Complete

## 2022-12-15 NOTE — Discharge Summary (Signed)
Physician Discharge Summary  [facility] ZOX:[id number] DOB: 01-01-1946 DOA: 12/08/2022  PCP: Alba Cory, MD  Admit date: 12/08/2022 Discharge date: 12/15/2022  Admitted From: Home Disposition:  SNF  Recommendations for Outpatient Follow-up:  Follow up with PCP in 1-2 weeks Follow-up with oncology Dr. Orlie Dakin Follow-up with pulmonology Dr. Karna Christmas  Home Health:No  Equipment/Devices:Oxygen [place] 2L   Discharge Condition:Stable  CODE STATUS:FULL  Diet recommendation: Reg  Brief/Interim Summary:  77 y.o. female with medical history significant for meningioma of the right parafalcine region s/p IMRT, hypertension, hyperlipidemia, reflux esophagitis,  Coming to [place] bilateral leg weakness.  It originally started on the 7/5 and patient had an MRI of the brain with and without contrast.  Patient was recently discharged on 6 July.  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  Patient is no longer on steroids per discharge summary.  Per PT note and chart review patient needs A little help with walking and/or transfers;Help with stairs or ramp for entrance;Assist for transportation.  Patient today also complains of generalized weakness.  States that she lives with her brother who is her healthcare power of attorney and has been helping at home.  Patient is a limited historian otherwise she does not report any shortness of breath but is found to be hypoxic with O2 sats of 89% in no distress.  Initial vitals also show blood pressure of 107/57 heart rate of 99 respirations of 18.  CTA chest showed bilateral infiltrates no pulmonary embolism.  Exam is otherwise normal except that patient is extremely weak.  Procalcitonin is negative.  Normal white count.    Etiology of current presentation is unclear.  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  Also previous admission considering nutritional deficiencies resulting in weakness and decreased ability  to ambulate.  I have engaged neurosurgery for recommendations.  Chest CT findings of unclear etiology.  Demonstrating bilateral infiltrates however no other clinical signs of infection.  Normal white count, no fevers.   7/13: Fungitell positive.  Pulmonology engaged for comment. 7/16: Patient continues to slowly improve.  Mobility improving.  Medically ready for discharge to skilled nursing facility.  Insurance authorization obtained.  Patient discharged in stable condition.  Follow-up outpatient PCP, oncology, pulmonology      Discharge Diagnoses:  Principal Problem:   Lower extremity weakness Active Problems:   Meningioma (HCC)   Bilateral leg weakness   Thrombocytopenia (HCC)   Essential hypertension, benign   OSA (obstructive sleep apnea)   Pre-diabetes   Gastroesophageal reflux disease without esophagitis   PNA (pneumonia)   Acute respiratory failure with hypoxia (HCC)   Inability to ambulate due to multiple joints  Bilateral lower extremity weakness Unclear etiology.  History of leg weakness progressive over 6 weeks.  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  MRI lumbar and thoracic spine with evidence of lucencies concerning for possible metastatic disease. Seen in consultation by neurosurgery.  No neurosurgical intervention warranted.  Seen on previous admission by neurology.  No neurologic etiology.  Suspect nutritional etiology.  Patient eating less than 25% of her meals. Plan: Continue frequent ambulation.   Stable for discharge to skilled nursing facility.  Optimize nutritional status.  Remeron added for appetite stimulant.  Outpatient follow-up PCP.  Meningioma Lumbar spinal lucency Patient follows with [facility] neurology.  Receiving RT with Dr. Rushie Chestnut. Inpatient oncology consulted.  Plan for outpatient PET scan for better characterization.  Discussed with IR.  Lesions not amenable to image guided  biopsy.  Oncology aware.  Will arrange for outpatient  PET   Liver lesions, ruled out CT abdomen pelvis did not demonstrate any liver lesions   Thrombocytopenia Appears chronic and asymptomatic   Essential hypertension Blood pressure soft.  Home diltiazem and Benicar on hold.  Will continue to hold on discharge.  Outpatient follow-up with PCP to discuss antihypertensive regimen   Obstructive sleep apnea Has been declining CPAP use in the hospital     Possible pneumonia, ruled out Unclear etiology.  Findings on CT suspicious for possible viral etiology Procalcitonin negative.  No fevers.  No elevated white count COVID swab negative Suspicion low for bacterial infection RVP negative Pulmonology engaged for comment on 7/13 Positive Fungitell Plan: Monitor off antibiotics Negative COVID, RVP, Legionella, strep pneumo Fungitell positive Fungal respiratory panel sent Will likely need outpatient follow-up with pulmonology for further discussion and management.  Case discussed with Dr. Karna Christmas   Bilateral interstitial/groundglass opacities Unclear etiology.  Fungitell positive. Plan: Pulmonology engaged for comment.   Recommendations appreciated.   Case discussed with Dr. Karna Christmas   GERD PPI  Discharge Instructions  Discharge Instructions     Diet - low sodium heart healthy   Complete by: As directed    Increase activity slowly   Complete by: As directed       Allergies as of 12/15/2022       Reactions   Atorvastatin    difficulty concentrating and focusing   Terbinafine And Related Dermatitis   Rash, itch, skin discoloration   Zetia [ezetimibe] Other (See Comments)   Muscle/joint pain        Medication List     STOP taking these medications    diltiazem 360 MG 24 hr capsule Commonly known as: CARDIZEM CD   olmesartan 20 MG tablet Commonly known as: BENICAR       TAKE these medications    aspirin 81 MG tablet Take 81 mg by mouth daily. Reported on 10/10/2015   cholecalciferol 1000 units  tablet Commonly known as: VITAMIN D Take 1,000 Units by mouth daily.   FISH OIL CONCENTRATE PO Take by mouth. Reported on 10/10/2015   latanoprost 0.005 % ophthalmic solution Commonly known as: XALATAN PLACE 1 DROP IN BOTH EYES AT BEDTIME   magnesium oxide 400 (240 Mg) MG tablet Commonly known as: MAG-OX Take 400 mg by mouth daily.   mirtazapine 15 MG tablet Commonly known as: REMERON Take 1 tablet (15 mg total) by mouth at bedtime.   multivitamin with minerals Tabs tablet Take 1 tablet by mouth daily.   omeprazole 20 MG capsule Commonly known as: PRILOSEC Take 1 capsule (20 mg total) by mouth 2 (two) times daily before a meal. What changed: when to take this   rosuvastatin 10 MG tablet Commonly known as: Crestor Take 1 tablet (10 mg total) by mouth daily.   sucralfate 1 g tablet Commonly known as: Carafate Take 1 tablet (1 g total) by mouth in the morning, at noon, and at bedtime.        Contact information for follow-up providers     Alba Cory, MD. Schedule an appointment as soon as possible for a visit in 1 week(s).   Specialty: Family Medicine Contact information: [address] [place] [address] [phone]         Vida Rigger, MD. Schedule an appointment as soon as possible for a visit in 1 week(s).   Specialty: Pulmonary Disease Contact information: [address] [place] [address]  [phone]         Jeralyn Ruths, MD. Schedule an appointment as soon as possible for a visit in 1 week(s).   Specialty: Oncology Contact information: [address] [place] [address] [phone]              Contact information for after-discharge care     Destination     [facility] Preferred SNF .   Service: Skilled Nursing Contact information: [address] [place] [address] [phone]                    Allergies  Allergen Reactions    Atorvastatin     difficulty concentrating and focusing   Terbinafine And Related Dermatitis    Rash, itch, skin discoloration   Zetia [Ezetimibe] Other (See Comments)    Muscle/joint pain    Consultations: Oncology Pulmonology   Procedures/Studies: CT ABDOMEN PELVIS W CONTRAST  Result Date: 12/09/2022 CLINICAL DATA:  Metastatic disease evaluation. History of vaginal cancer. Unknown date of diagnosis. EXAM: CT ABDOMEN AND PELVIS WITH CONTRAST TECHNIQUE: Multidetector CT imaging of the abdomen and pelvis was performed using the standard protocol following bolus administration of intravenous contrast. RADIATION DOSE REDUCTION: This exam was performed according to the departmental dose-optimization program which includes automated exposure control, adjustment of the mA and/or kV according to patient size and/or use of iterative reconstruction technique. CONTRAST:  OMNIPAQUE IOHEXOL 300 MG/ML  SOLN COMPARISON:  Only available body CT comparison is yesterday's CTA chest. No prior abdomen and pelvis CT. FINDINGS: Lower chest: The heart is slightly enlarged. There is no significant pericardial effusion. Elevated right hemidiaphragm is again shown. There are trace pleural effusions again noted as well as heterogeneous patchy ground-glass opacities in the left upper lobe visualized portion with underlying interstitial thickening, similar opacities interval increased in the medial right middle lobe and to a lesser extent in the lower lobes where the opacities are intermixed with coarse atelectatic markings. Consider multifocal pneumonia including viral and atypical etiologies. Hepatobiliary: The liver is moderately steatotic without mass enhancement. No significant liver enlargement. The gallbladder and bile ducts are unremarkable. Pancreas: Partially atrophic, otherwise unremarkable. Spleen: No abnormality. Adrenals/Urinary Tract: Adrenal glands are unremarkable. Kidneys are normal, without renal  calculi, focal lesion, or hydronephrosis. Bladder is unremarkable. Stomach/Bowel: No dilatation or wall thickening including the retrocecal appendix. Uncomplicated sigmoid diverticulosis. Normal small stool burden. Vascular/Lymphatic: Aortic atherosclerosis. No enlarged abdominal or pelvic lymph nodes. Reproductive: The uterus is either small and atrophic or the patient could have had a supracervical hysterectomy. The ovaries are not enlarged. There is a small calcified fibroid in the right wall of the uterus or cervix remnant. There are metallic structures along either side of the vaginal cuff which could be surgical clips or fiducial markers. There is no overt thickening of the vaginal cuff. Other: Small umbilical fat hernia. No incarcerated hernia. No free fluid, free hemorrhage or free air, or acute inflammatory changes. Musculoskeletal: Mild degenerative change of the spine. No destructive bone lesions. IMPRESSION: 1. No acute findings or CT evidence of metastatic disease in the abdomen or pelvis. 2. Moderate hepatic steatosis. 3. Aortic atherosclerosis. 4. Diverticulosis without evidence of diverticulitis. 5. Trace pleural effusions with heterogeneous ground-glass opacities and interstitial thickening in the visualized lung fields. Consider multifocal pneumonia including viral and atypical etiologies. 6. Small umbilical fat hernia. 7. Metallic structures along either side of the vaginal cuff which could be surgical clips or fiducial markers. There  is no overt thickening of the vaginal cuff. Aortic Atherosclerosis (ICD10-I70.0). Electronically Signed   By: Almira Bar M.D.   On: 12/09/2022 20:26   US Venous Img Lower Bilateral (DVT)  Result Date: 12/09/2022 CLINICAL DATA:  77 year old female with history of bilateral leg weakness. EXAM: BILATERAL LOWER EXTREMITY VENOUS DOPPLER ULTRASOUND TECHNIQUE: Gray-scale sonography with graded compression, as well as color Doppler and duplex ultrasound were  performed to evaluate the lower extremity deep venous systems from the level of the common femoral vein and including the common femoral, femoral, profunda femoral, popliteal and calf veins including the posterior tibial, peroneal and gastrocnemius veins when visible. The superficial great saphenous vein was also interrogated. Spectral Doppler was utilized to evaluate flow at rest and with distal augmentation maneuvers in the common femoral, femoral and popliteal veins. COMPARISON:  None Available. FINDINGS: RIGHT LOWER EXTREMITY Common Femoral Vein: No evidence of thrombus. Normal compressibility, respiratory phasicity and response to augmentation. Saphenofemoral Junction: No evidence of thrombus. Normal compressibility and flow on color Doppler imaging. Profunda Femoral Vein: No evidence of thrombus. Normal compressibility and flow on color Doppler imaging. Femoral Vein: No evidence of thrombus. Normal compressibility, respiratory phasicity and response to augmentation. Popliteal Vein: No evidence of thrombus. Normal compressibility, respiratory phasicity and response to augmentation. Calf Veins: No evidence of thrombus. Normal compressibility and flow on color Doppler imaging. Superficial Great Saphenous Vein: No evidence of thrombus. Normal compressibility. Venous Reflux:  None. Other Findings: In the popliteal fossa there is a 1.5 x 0.6 x 1.9 cm heterogeneously hypoechoic lesion with increased through transmission, ovoid in shape, without internal blood flow, presumably a Baker's cyst. LEFT LOWER EXTREMITY Common Femoral Vein: No evidence of thrombus. Normal compressibility, respiratory phasicity and response to augmentation. Saphenofemoral Junction: No evidence of thrombus. Normal compressibility and flow on color Doppler imaging. Profunda Femoral Vein: No evidence of thrombus. Normal compressibility and flow on color Doppler imaging. Femoral Vein: No evidence of thrombus. Normal compressibility, respiratory  phasicity and response to augmentation. Popliteal Vein: No evidence of thrombus. Normal compressibility, respiratory phasicity and response to augmentation. Calf Veins: No evidence of thrombus. Normal compressibility and flow on color Doppler imaging. Superficial Great Saphenous Vein: No evidence of thrombus. Normal compressibility. Venous Reflux:  None. Other Findings:  None. IMPRESSION: 1. No evidence of deep venous thrombosis in either lower extremity. 2. Small right-sided Baker's cyst incidentally noted, as above. Electronically Signed   By: Trudie Reed M.D.   On: 12/09/2022 05:37   MR THORACIC SPINE W WO CONTRAST  Result Date: 12/09/2022 CLINICAL DATA:  Ataxia, concern for metastatic disease EXAM: MRI THORACIC AND LUMBAR SPINE WITHOUT AND WITH CONTRAST TECHNIQUE: Multiplanar and multiecho pulse sequences of the thoracic and lumbar spine were obtained without and with intravenous contrast. CONTRAST:  8mL GADAVIST GADOBUTROL 1 MMOL/ML IV SOLN COMPARISON:  No prior MRI of the thoracic or lumbar spine available. FINDINGS: MRI THORACIC SPINE FINDINGS Alignment: Preservation of the normal thoracic kyphosis. No listhesis. Mild S-shaped curvature of the thoracolumbar spine. Vertebrae: No acute fracture or evidence of discitis. T2 hyperintense, contrast-enhancing lesions in the right posterior elements at T6 (series 13, image 5 and series 8, image 5), and T10 (series 13, image 6 and series 8, image 6) could represent metastatic lesions in the T5 right pedicle and transverse process and T10 right facet. A T1 isointense, mildly T2 hyperintense, and minimally enhancing lesion in the T10 vertebral body is nonspecific and could represent a suspicious lesion or an atypical hemangioma. Cord:  Normal  signal and morphology.  No abnormal enhancement. Paraspinal and other soft tissues: Trace bilateral pleural effusions. Airspace opacities are better visualized on 12/08/2022 CTA chest. Disc levels: Mild degenerative  changes without significant spinal canal stenosis or neural foraminal narrowing. MRI LUMBAR SPINE FINDINGS Segmentation:  5 lumbar type vertebral bodies. Alignment: No significant listhesis. Preservation of the normal thoracic lordosis. Mild levocurvature. Vertebrae: No acute fracture or evidence of discitis. 5 mm focus of increased T2 signal and enhancement in the L1 vertebral body (series 6, image 11), which is nonspecific but could represent a metastatic lesion Conus medullaris: Extends to the L1-L2 level and appears normal. No abnormal enhancement. Paraspinal and other soft tissues: Atrophy of the inferior paraspinous musculature. No lymphadenopathy. Disc levels: T12-L1: No significant disc bulge. No spinal canal stenosis or neural foraminal narrowing. L1-L2: No significant disc bulge. No spinal canal stenosis or neural foraminal narrowing. L2-L3: No significant disc bulge. No spinal canal stenosis or neural foraminal narrowing. L3-L4: Left foraminal and extreme lateral disc protrusion. No spinal canal stenosis or neural foraminal narrowing. L4-L5: Mild disc bulge. Mild facet arthropathy. Narrowing of the lateral recesses. No spinal canal stenosis. Mild bilateral neural foraminal narrowing. L5-S1: Minimal disc bulge with left subarticular and foraminal protrusion. No spinal canal stenosis. Mild left neural foraminal narrowing. IMPRESSION: 1. Small enhancing lesions in the right posterior elements at T6 and T10, as well as possibly at L1, which could represent metastatic disease. A lesion in the T10 vertebral body has a slightly different signal characteristics and could represent a suspicious lesion or an atypical hemangioma. 2. No spinal canal stenosis or neural foraminal narrowing in the thoracic spine. 3. L4-L5 mild bilateral neural foraminal narrowing. Narrowing of the lateral recesses at this level could affect the descending L5 nerve roots. 4. L5-S1 mild left neural foraminal narrowing. Electronically  Signed   By: Wiliam Ke M.D.   On: 12/09/2022 03:41   MR Lumbar Spine W Wo Contrast  Result Date: 12/09/2022 CLINICAL DATA:  Ataxia, concern for metastatic disease EXAM: MRI THORACIC AND LUMBAR SPINE WITHOUT AND WITH CONTRAST TECHNIQUE: Multiplanar and multiecho pulse sequences of the thoracic and lumbar spine were obtained without and with intravenous contrast. CONTRAST:  8mL GADAVIST GADOBUTROL 1 MMOL/ML IV SOLN COMPARISON:  No prior MRI of the thoracic or lumbar spine available. FINDINGS: MRI THORACIC SPINE FINDINGS Alignment: Preservation of the normal thoracic kyphosis. No listhesis. Mild S-shaped curvature of the thoracolumbar spine. Vertebrae: No acute fracture or evidence of discitis. T2 hyperintense, contrast-enhancing lesions in the right posterior elements at T6 (series 13, image 5 and series 8, image 5), and T10 (series 13, image 6 and series 8, image 6) could represent metastatic lesions in the T5 right pedicle and transverse process and T10 right facet. A T1 isointense, mildly T2 hyperintense, and minimally enhancing lesion in the T10 vertebral body is nonspecific and could represent a suspicious lesion or an atypical hemangioma. Cord:  Normal signal and morphology.  No abnormal enhancement. Paraspinal and other soft tissues: Trace bilateral pleural effusions. Airspace opacities are better visualized on 12/08/2022 CTA chest. Disc levels: Mild degenerative changes without significant spinal canal stenosis or neural foraminal narrowing. MRI LUMBAR SPINE FINDINGS Segmentation:  5 lumbar type vertebral bodies. Alignment: No significant listhesis. Preservation of the normal thoracic lordosis. Mild levocurvature. Vertebrae: No acute fracture or evidence of discitis. 5 mm focus of increased T2 signal and enhancement in the L1 vertebral body (series 6, image 11), which is nonspecific but could represent a metastatic lesion Conus  medullaris: Extends to the L1-L2 level and appears normal. No abnormal  enhancement. Paraspinal and other soft tissues: Atrophy of the inferior paraspinous musculature. No lymphadenopathy. Disc levels: T12-L1: No significant disc bulge. No spinal canal stenosis or neural foraminal narrowing. L1-L2: No significant disc bulge. No spinal canal stenosis or neural foraminal narrowing. L2-L3: No significant disc bulge. No spinal canal stenosis or neural foraminal narrowing. L3-L4: Left foraminal and extreme lateral disc protrusion. No spinal canal stenosis or neural foraminal narrowing. L4-L5: Mild disc bulge. Mild facet arthropathy. Narrowing of the lateral recesses. No spinal canal stenosis. Mild bilateral neural foraminal narrowing. L5-S1: Minimal disc bulge with left subarticular and foraminal protrusion. No spinal canal stenosis. Mild left neural foraminal narrowing. IMPRESSION: 1. Small enhancing lesions in the right posterior elements at T6 and T10, as well as possibly at L1, which could represent metastatic disease. A lesion in the T10 vertebral body has a slightly different signal characteristics and could represent a suspicious lesion or an atypical hemangioma. 2. No spinal canal stenosis or neural foraminal narrowing in the thoracic spine. 3. L4-L5 mild bilateral neural foraminal narrowing. Narrowing of the lateral recesses at this level could affect the descending L5 nerve roots. 4. L5-S1 mild left neural foraminal narrowing. Electronically Signed   By: Wiliam Ke M.D.   On: 12/09/2022 03:41   CT Angio Chest PE W and/or Wo Contrast  Result Date: 12/08/2022 CLINICAL DATA:  High probability for PE EXAM: CT ANGIOGRAPHY CHEST WITH CONTRAST TECHNIQUE: Multidetector CT imaging of the chest was performed using the standard protocol during bolus administration of intravenous contrast. Multiplanar CT image reconstructions and MIPs were obtained to evaluate the vascular anatomy. RADIATION DOSE REDUCTION: This exam was performed according to the departmental dose-optimization program  which includes automated exposure control, adjustment of the mA and/or kV according to patient size and/or use of iterative reconstruction technique. CONTRAST:  75mL OMNIPAQUE IOHEXOL 350 MG/ML SOLN COMPARISON:  None Available. FINDINGS: Cardiovascular: The heart is enlarged. There is no pericardial effusion. The aorta is normal in size. There are atherosclerotic calcifications of the aorta. There is adequate opacification of the pulmonary arteries to the segmental level. There is no evidence for pulmonary embolism. Mediastinum/Nodes: There are diffuse nonenlarged mediastinal and hilar lymph nodes. Visualized esophagus and thyroid gland are within normal limits. Lungs/Pleura: There are multifocal interstitial, airspace and ground-glass patchy opacities predominantly in the bilateral upper lobes but also minimally in the left lower lobe. There are trace bilateral pleural effusions. There is no pneumothorax. Trachea and central airways are patent. Upper Abdomen: Heterogeneous hypodensity throughout the right lobe of the liver is incompletely characterized on this study. Musculoskeletal: No chest wall abnormality. No acute or significant osseous findings. Review of the MIP images confirms the above findings. IMPRESSION: 1. No evidence for pulmonary embolism. 2. Multifocal interstitial, airspace and ground-glass opacities predominantly in the bilateral upper lobes but also minimally in the left lower lobe. Findings are concerning for multifocal pneumonia. COVID pneumonia can have this appearance. 3. Trace bilateral pleural effusions. 4. Cardiomegaly. 5. Heterogeneous hypodensity throughout the right lobe of the liver is incompletely characterized on this study. Differential diagnosis includes metastatic disease or infection. Recommend further evaluation with dedicated CT or MRI of the abdomen with contrast. Aortic Atherosclerosis (ICD10-I70.0). Electronically Signed   By: Darliss Cheney M.D.   On: 12/08/2022 23:15   CT  HEAD WO CONTRAST ( )  Result Date: 12/08/2022 CLINICAL DATA:  Mental status change, unknown cause EXAM: CT HEAD WITHOUT CONTRAST TECHNIQUE: Contiguous axial  images were obtained from the base of the skull through the vertex without intravenous contrast. RADIATION DOSE REDUCTION: This exam was performed according to the departmental dose-optimization program which includes automated exposure control, adjustment of the mA and/or kV according to patient size and/or use of iterative reconstruction technique. COMPARISON:  05/11/2020, MRI 12/04/2022 FINDINGS: Brain: 2.2 cm extra-axial low-density meningioma again seen along the right side of the falx, unchanged in size since recent MRI. Overlying adjacent brain edema, stable. No acute infarct, hemorrhage or hydrocephalus. Vascular: No hyperdense vessel or unexpected calcification. Skull: No acute calvarial abnormality. Sinuses/Orbits: No acute findings Other: None IMPRESSION: Stable meningioma along the high right falx with mild adjacent brain edema, unchanged since recent study. No acute intracranial abnormality. Electronically Signed   By: Charlett Nose M.D.   On: 12/08/2022 22:36   DG Chest 2 View  Result Date: 12/08/2022 CLINICAL DATA:  Weakness. EXAM: CHEST - 2 VIEW COMPARISON:  Chest radiograph dated 05/11/2020. FINDINGS: Bilateral streaky interstitial densities, left greater than right, most consistent with developing infiltrate. No pleural effusion or pneumothorax. The cardiac silhouette is within normal limits. Atherosclerotic calcification of the aortic arch. No acute osseous pathology. IMPRESSION: Developing bilateral infiltrates. Electronically Signed   By: Elgie Collard M.D.   On: 12/08/2022 18:08   MR Brain W and Wo Contrast  Result Date: 12/04/2022 CLINICAL DATA:  Motor neuron disease suspected EXAM: MRI HEAD WITHOUT AND WITH CONTRAST TECHNIQUE: Multiplanar, multiecho pulse sequences of the brain and surrounding structures were obtained without  and with intravenous contrast. CONTRAST:  7.10mL GADAVIST GADOBUTROL 1 MMOL/ML IV SOLN COMPARISON:  MRI head April 22, 2021. FINDINGS: Brain: Homogeneously enhancing 2.1 x 1.5 cm dural based enhancing mass along the high right falx (series 16, image 126), probably a meningioma. There is mild adjacent brain edema and mass effect. The mass is inferior to the superior sagittal sinus. No other pathologic enhancement. No evidence of acute infarct, acute hemorrhage, midline shift or hydrocephalus. Vascular: Major arterial flow voids are maintained at the skull base. Skull and upper cervical spine: Normal marrow signal. Sinuses/Orbits: Clear sinuses.  No acute orbital findings. Other: No mastoid effusions. IMPRESSION: Similar Putative 2.1 cm meningioma along the high right falx. Mild adjacent brain edema and mass effect is similar. Electronically Signed   By: Feliberto Harts M.D.   On: 12/04/2022 14:33      Subjective: Seen and examined on the day of discharge.  Stable no distress.  Appropriate for discharge to skilled nursing facility.  Discharge Exam: Vitals:   12/15/22 0814 12/15/22 1413  BP: 127/67 (!) 101/52  Pulse: 78 93  Resp: 16 16  Temp: 97.6 F (36.4 C) (!) 97.4 F (36.3 C)  SpO2: 97% 99%   Vitals:   12/14/22 0752 12/14/22 1515 12/15/22 0814 12/15/22 1413  BP: 120/84 114/68 127/67 (!) 101/52  Pulse: 66 87 78 93  Resp: 17 16 16 16   Temp: 98 F (36.7 C) 97.6 F (36.4 C) 97.6 F (36.4 C) (!) 97.4 F (36.3 C)  TempSrc:  Oral    SpO2: 97% 99% 97% 99%  Weight:      Height:        General: Pt is alert, awake, not in acute distress Cardiovascular: RRR, S1/S2 +, no rubs, no gallops Respiratory: CTA bilaterally, no wheezing, no rhonchi Abdominal: Soft, NT, ND, bowel sounds + Extremities: no edema, no cyanosis    The results of significant diagnostics from this hospitalization (including imaging, microbiology, ancillary and laboratory) are listed below  for reference.      Microbiology: Recent Results (from the past 240 hour(s))  Blood culture (single)     Status: None   Collection Time: 12/09/22 12:18 AM   Specimen: BLOOD  Result Value Ref Range Status   Specimen Description BLOOD BLOOD RIGHT ARM  Final   Special Requests   Final    BOTTLES DRAWN AEROBIC AND ANAEROBIC Blood Culture results may not be optimal due to an excessive volume of blood received in culture bottles   Culture   Final    NO GROWTH 5 DAYS Performed at [facility], [address] [place], [place] [address]    Report Status 12/14/2022 FINAL  Final  SARS Coronavirus 2 by RT PCR (hospital order, performed in [facility] hospital lab) *cepheid single result test* Anterior Nasal Swab     Status: None   Collection Time: 12/09/22 12:18 AM   Specimen: Anterior Nasal Swab  Result Value Ref Range Status   SARS Coronavirus 2 by RT PCR NEGATIVE NEGATIVE Final    Comment: (NOTE) SARS-CoV-2 target nucleic acids are NOT DETECTED.  The SARS-CoV-2 RNA is generally detectable in upper and lower respiratory specimens during the acute phase of infection. The lowest concentration of SARS-CoV-2 viral copies this assay can detect is 250 copies / mL. A negative result does not preclude SARS-CoV-2 infection and should not be used as the sole basis for treatment or other patient management decisions.  A negative result may occur with improper specimen collection / handling, submission of specimen other than nasopharyngeal swab, presence of viral mutation(s) within the areas targeted by this assay, and inadequate number of viral copies (<250 copies / mL). A negative result must be combined with clinical observations, patient history, and epidemiological information.  Fact Sheet for Patients:   RoadLapTop.co.za  Fact Sheet for Healthcare Providers: http://kim-miller.com/  This test is not yet approved or  cleared by the [place] FDA  and has been authorized for detection and/or diagnosis of SARS-CoV-2 by FDA under an Emergency Use Authorization (EUA).  This EUA will remain in effect (meaning this test can be used) for the duration of the COVID-19 declaration under Section 564(b)(1) of the Act, 21 U.S.C. section 360bbb-3(b)(1), unless the authorization is terminated or revoked sooner.  Performed at [facility], [address] [place], [place] [address]   MRSA Next Gen by PCR, Nasal     Status: None   Collection Time: 12/09/22  2:31 PM   Specimen: Urine, Clean Catch; Nasal Swab  Result Value Ref Range Status   MRSA by PCR Next Gen NOT DETECTED NOT DETECTED Final    Comment: (NOTE) The GeneXpert MRSA Assay (FDA approved for NASAL specimens only), is one component of a comprehensive MRSA colonization surveillance program. It is not intended to diagnose MRSA infection nor to guide or monitor treatment for MRSA infections. Test performance is not FDA approved in patients less than 14 years old. Performed at [facility], [address] [place], [place] [address]   Respiratory (~20 pathogens) panel by PCR     Status: None   Collection Time: 12/09/22  4:14 PM   Specimen: Nasopharyngeal Swab; Respiratory  Result Value Ref Range Status   Adenovirus NOT DETECTED NOT DETECTED Final   Coronavirus 229E NOT DETECTED NOT DETECTED Final    Comment: (NOTE) The Coronavirus on the Respiratory Panel, DOES NOT test for the novel  Coronavirus (2019 nCoV)    Coronavirus HKU1 NOT DETECTED NOT DETECTED Final  Coronavirus NL63 NOT DETECTED NOT DETECTED Final   Coronavirus OC43 NOT DETECTED NOT DETECTED Final   Metapneumovirus NOT DETECTED NOT DETECTED Final   Rhinovirus / Enterovirus NOT DETECTED NOT DETECTED Final   Influenza A NOT DETECTED NOT DETECTED Final   Influenza B NOT DETECTED NOT DETECTED Final   Parainfluenza Virus 1 NOT DETECTED NOT DETECTED Final   Parainfluenza Virus 2 NOT DETECTED NOT  DETECTED Final   Parainfluenza Virus 3 NOT DETECTED NOT DETECTED Final   Parainfluenza Virus 4 NOT DETECTED NOT DETECTED Final   Respiratory Syncytial Virus NOT DETECTED NOT DETECTED Final   Bordetella pertussis NOT DETECTED NOT DETECTED Final   Bordetella Parapertussis NOT DETECTED NOT DETECTED Final   Chlamydophila pneumoniae NOT DETECTED NOT DETECTED Final   Mycoplasma pneumoniae NOT DETECTED NOT DETECTED Final    Comment: Performed at [facility], [address] [place], [place] [address]  Culture, fungus without smear     Status: None (Preliminary result)   Collection Time: 12/14/22  5:58 PM   Specimen: Sputum; Other  Result Value Ref Range Status   Specimen Description   Final    SPU Performed at [facility], [address] [place], [place] [address]    Special Requests   Final    NONE Performed at [facility], [address], [place], [place] [address]    Culture   Final    NO GROWTH < 12 HOURS Performed at [facility], [address], [place], [place] [address]    Report Status PENDING  Incomplete     Labs: BNP (last 3 results) Recent Labs    12/08/22 1733  BNP 89.0   Basic Metabolic Panel: Recent Labs  Lab 12/08/22 1733 12/09/22 0213 12/10/22 0517  NA 137  --   --   K 3.7  --   --   CL 103  --   --   CO2 23  --   --   GLUCOSE 112*  --   --   BUN 12  --   --   CREATININE 0.63 0.65 0.52  CALCIUM 8.3*  --   --    Liver Function Tests: Recent Labs  Lab 12/08/22 1733  AST 38  ALT 39  ALKPHOS 58  BILITOT 0.7  PROT 5.8*  ALBUMIN 2.5*   No results for input(s): "LIPASE", "AMYLASE" in the last 168 hours. No results for input(s): "AMMONIA" in the last 168 hours. CBC: Recent Labs  Lab 12/08/22 1733 12/09/22 0213 12/14/22 1541  WBC 5.7 5.4 4.4  NEUTROABS  --   --  2.9  HGB 11.8* 10.1* 10.7*  HCT 34.8* 30.7* 32.9*  MCV 87.4 89.5 89.6  PLT 136* 136* 397   Cardiac Enzymes: Recent Labs  Lab 12/08/22 1733   CKTOTAL 39   BNP: Invalid input(s): "POCBNP" CBG: Recent Labs  Lab 12/14/22 1140 12/14/22 1707 12/14/22 2156 12/15/22 0818 12/15/22 1200  GLUCAP 189* 128* 142* 92 144*   D-Dimer No results for input(s): "DDIMER" in the last 72 hours. Hgb A1c No results for input(s): "HGBA1C" in the last 72 hours. Lipid Profile No results for input(s): "CHOL", "HDL", "LDLCALC", "TRIG", "CHOLHDL", "LDLDIRECT" in the last 72 hours. Thyroid function studies No results for input(s): "TSH", "T4TOTAL", "T3FREE", "THYROIDAB" in the last 72 hours.  Invalid input(s): "FREET3" Anemia work up No results for input(s): "VITAMINB12", "FOLATE", "FERRITIN", "TIBC", "IRON", "RETICCTPCT" in the last 72 hours. Urinalysis    Component Value Date/Time  COLORURINE AMBER (A) 12/08/2022 2127   APPEARANCEUR HAZY (A) 12/08/2022 2127   LABSPEC 1.023 12/08/2022 2127   PHURINE 6.0 12/08/2022 2127   GLUCOSEU NEGATIVE 12/08/2022 2127   HGBUR NEGATIVE 12/08/2022 2127   BILIRUBINUR NEGATIVE 12/08/2022 2127   BILIRUBINUR neg 10/28/2021 1457   KETONESUR 5 (A) 12/08/2022 2127   PROTEINUR 100 (A) 12/08/2022 2127   UROBILINOGEN 0.2 10/28/2021 1457   NITRITE NEGATIVE 12/08/2022 2127   LEUKOCYTESUR NEGATIVE 12/08/2022 2127   Sepsis Labs Recent Labs  Lab 12/08/22 1733 12/09/22 0213 12/14/22 1541  WBC 5.7 5.4 4.4   Microbiology Recent Results (from the past 240 hour(s))  Blood culture (single)     Status: None   Collection Time: 12/09/22 12:18 AM   Specimen: BLOOD  Result Value Ref Range Status   Specimen Description BLOOD BLOOD RIGHT ARM  Final   Special Requests   Final    BOTTLES DRAWN AEROBIC AND ANAEROBIC Blood Culture results may not be optimal due to an excessive volume of blood received in culture bottles   Culture   Final    NO GROWTH 5 DAYS Performed at [facility], [address], [place], [place] [address]    Report Status 12/14/2022 FINAL  Final  SARS Coronavirus 2 by RT PCR (hospital  order, performed in [facility] hospital lab) *cepheid single result test* Anterior Nasal Swab     Status: None   Collection Time: 12/09/22 12:18 AM   Specimen: Anterior Nasal Swab  Result Value Ref Range Status   SARS Coronavirus 2 by RT PCR NEGATIVE NEGATIVE Final    Comment: (NOTE) SARS-CoV-2 target nucleic acids are NOT DETECTED.  The SARS-CoV-2 RNA is generally detectable in upper and lower respiratory specimens during the acute phase of infection. The lowest concentration of SARS-CoV-2 viral copies this assay can detect is 250 copies / mL. A negative result does not preclude SARS-CoV-2 infection and should not be used as the sole basis for treatment or other patient management decisions.  A negative result may occur with improper specimen collection / handling, submission of specimen other than nasopharyngeal swab, presence of viral mutation(s) within the areas targeted by this assay, and inadequate number of viral copies (<250 copies / mL). A negative result must be combined with clinical observations, patient history, and epidemiological information.  Fact Sheet for Patients:   RoadLapTop.co.za  Fact Sheet for Healthcare Providers: http://kim-miller.com/  This test is not yet approved or  cleared by the [place] FDA and has been authorized for detection and/or diagnosis of SARS-CoV-2 by FDA under an Emergency Use Authorization (EUA).  This EUA will remain in effect (meaning this test can be used) for the duration of the COVID-19 declaration under Section 564(b)(1) of the Act, 21 U.S.C. section 360bbb-3(b)(1), unless the authorization is terminated or revoked sooner.  Performed at [facility], [address] [place], [place] [address]   MRSA Next Gen by PCR, Nasal     Status: None   Collection Time: 12/09/22  2:31 PM   Specimen: Urine, Clean Catch; Nasal Swab  Result Value Ref Range Status   MRSA by PCR Next  Gen NOT DETECTED NOT DETECTED Final    Comment: (NOTE) The GeneXpert MRSA Assay (FDA approved for NASAL specimens only), is one component of a comprehensive MRSA colonization surveillance program. It is not intended to diagnose MRSA infection nor to guide or monitor treatment for MRSA infections. Test performance is not FDA approved in patients less than 71 years old.  Performed at [facility], [address] [place], [place] [address]   Respiratory (~20 pathogens) panel by PCR     Status: None   Collection Time: 12/09/22  4:14 PM   Specimen: Nasopharyngeal Swab; Respiratory  Result Value Ref Range Status   Adenovirus NOT DETECTED NOT DETECTED Final   Coronavirus 229E NOT DETECTED NOT DETECTED Final    Comment: (NOTE) The Coronavirus on the Respiratory Panel, DOES NOT test for the novel  Coronavirus (2019 nCoV)    Coronavirus HKU1 NOT DETECTED NOT DETECTED Final   Coronavirus NL63 NOT DETECTED NOT DETECTED Final   Coronavirus OC43 NOT DETECTED NOT DETECTED Final   Metapneumovirus NOT DETECTED NOT DETECTED Final   Rhinovirus / Enterovirus NOT DETECTED NOT DETECTED Final   Influenza A NOT DETECTED NOT DETECTED Final   Influenza B NOT DETECTED NOT DETECTED Final   Parainfluenza Virus 1 NOT DETECTED NOT DETECTED Final   Parainfluenza Virus 2 NOT DETECTED NOT DETECTED Final   Parainfluenza Virus 3 NOT DETECTED NOT DETECTED Final   Parainfluenza Virus 4 NOT DETECTED NOT DETECTED Final   Respiratory Syncytial Virus NOT DETECTED NOT DETECTED Final   Bordetella pertussis NOT DETECTED NOT DETECTED Final   Bordetella Parapertussis NOT DETECTED NOT DETECTED Final   Chlamydophila pneumoniae NOT DETECTED NOT DETECTED Final   Mycoplasma pneumoniae NOT DETECTED NOT DETECTED Final    Comment: Performed at [facility], [address] [place], [place] [address]  Culture, fungus without smear     Status: None (Preliminary result)   Collection Time: 12/14/22  5:58 PM   Specimen:  Sputum; Other  Result Value Ref Range Status   Specimen Description   Final    SPU Performed at [facility], [address] [place], [place] [address]    Special Requests   Final    NONE Performed at [facility], [address], [place], [place] [address]    Culture   Final    NO GROWTH < 12 HOURS Performed at [facility], [address], [place], [place] [address]    Report Status PENDING  Incomplete     Time coordinating discharge: Over 30 minutes  SIGNED:   Tresa Moore, MD  [facility] 12/15/2022, 2:28 PM Pager   If 7PM-7AM, please contact night-coverage

## 2022-12-15 NOTE — Progress Notes (Signed)
PROGRESS NOTE    Vickie Stewart  DGU:[id number] DOB: 24-May-1946 DOA: 12/08/2022 PCP: Alba Cory, MD    Brief Narrative:  77 y.o. female with medical history significant for meningioma of the right parafalcine region s/p IMRT, hypertension, hyperlipidemia, reflux esophagitis,  Coming to [place] bilateral leg weakness.  It originally started on the 7/5 and patient had an MRI of the brain with and without contrast.  Patient was recently discharged on 6 July.  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  Patient is no longer on steroids per discharge summary.  Per PT note and chart review patient needs A little help with walking and/or transfers;Help with stairs or ramp for entrance;Assist for transportation.  Patient today also complains of generalized weakness.  States that she lives with her brother who is her healthcare power of attorney and has been helping at home.  Patient is a limited historian otherwise she does not report any shortness of breath but is found to be hypoxic with O2 sats of 89% in no distress.  Initial vitals also show blood pressure of 107/57 heart rate of 99 respirations of 18.  CTA chest showed bilateral infiltrates no pulmonary embolism.  Exam is otherwise normal except that patient is extremely weak.  Procalcitonin is negative.  Normal white count.   Etiology of current presentation is unclear.  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  Also previous admission considering nutritional deficiencies resulting in weakness and decreased ability to ambulate.  I have engaged neurosurgery for recommendations.  Chest CT findings of unclear etiology.  Demonstrating bilateral infiltrates however no other clinical signs of infection.  Normal white count, no fevers.  7/13: Fungitell positive.  Pulmonology engaged for comment. 7/16: Patient continues to slowly improve.  Mobility improving.  Medically ready for discharge to skilled  nursing facility.  PASSR pending.   Assessment & Plan:   Principal Problem:   Lower extremity weakness Active Problems:   Meningioma (HCC)   Bilateral leg weakness   Thrombocytopenia (HCC)   Essential hypertension, benign   OSA (obstructive sleep apnea)   Pre-diabetes   Gastroesophageal reflux disease without esophagitis   PNA (pneumonia)   Acute respiratory failure with hypoxia (HCC)   Inability to ambulate due to multiple joints  Bilateral lower extremity weakness Unclear etiology.  History of leg weakness progressive over 6 weeks.  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  MRI lumbar and thoracic spine with evidence of lucencies concerning for possible metastatic disease. Seen in consultation by neurosurgery.  No neurosurgical intervention warranted.  Seen on previous admission by neurology.  No neurologic etiology.  Suspect nutritional etiology.  Patient eating less than 25% of her meals. Plan: Continue frequent ambulation.   PT OT as tolerated.   Optimize nutritional status.   Plan for skilled nursing facility at time of discharge.   Patient medically ready for discharge at this time. TOC working on placement [facility] made bed offer.  PASSR pending  Meningioma Lumbar spinal lucency Patient follows with [facility] neurology.  Receiving RT with Dr. Rushie Chestnut. Inpatient oncology consulted.  Plan for outpatient PET scan for better characterization.  Discussed with IR.  Lesions not amenable to image guided biopsy.  Oncology aware.  Will arrange for outpatient PET  Liver lesions, ruled out CT abdomen pelvis did not demonstrate any liver lesions  Thrombocytopenia Appears chronic and asymptomatic  Essential hypertension Blood pressure soft.  Home diltiazem and Benicar on hold  Obstructive sleep apnea  Goes for CPAP  Acute respiratory failure with hypoxia Ruled out Patient on room air  Possible pneumonia, ruled out Unclear etiology.  Findings on CT  suspicious for possible viral etiology Procalcitonin negative.  No fevers.  No elevated white count COVID swab negative Suspicion low for bacterial infection RVP negative Pulmonology engaged for comment on 7/13 Positive Fungitell Plan: Monitor off antibiotics Negative COVID, RVP, Legionella, strep pneumo Fungitell positive Fungal respiratory panel sent Will likely need outpatient follow-up with pulmonology for further discussion and management.  Case discussed with Dr. Karna Christmas  Bilateral interstitial/groundglass opacities Unclear etiology.  Fungitell positive. Plan: Pulmonology engaged for comment.   Recommendations appreciated.   Case discussed with Dr. Karna Christmas  GERD PPI   DVT prophylaxis: SQ heparin Code Status: Full Family Communication:None today Disposition Plan: Status is: Inpatient Remains inpatient appropriate because: Unsafe discharge plan.  Will need skilled nursing facility.  Medically appropriate for discharge to SNF   Level of care: Med-Surg  Consultants:  Neurosurgery  Procedures:  None  Antimicrobials:   Subjective: Seen and examined.  Resting in bed.  Reports mobility is improving.  Objective: Vitals:   12/13/22 2310 12/14/22 0752 12/14/22 1515 12/15/22 0814  BP: 125/70 120/84 114/68 127/67  Pulse: 78 66 87 78  Resp: 20 17 16 16   Temp: 98.4 F (36.9 C) 98 F (36.7 C) 97.6 F (36.4 C) 97.6 F (36.4 C)  TempSrc:   Oral   SpO2: 100% 97% 99% 97%  Weight:      Height:        Intake/Output Summary (Last 24 hours) at 12/15/2022 1041 Last data filed at 12/14/2022 1934 Gross per 24 hour  Intake 240 ml  Output --  Net 240 ml    Filed Weights   12/08/22 1726  Weight: 82.6 kg    Examination:  General exam: No acute distress.  Energy level appears improved Respiratory system: Crackles in bilateral upper lobes.  Poor respiratory effort.  Normal work of breathing.  2 L Cardiovascular system: S1-S2, RRR, no murmurs, no pedal  edema Gastrointestinal system: Soft, NT/ND, normal bowel sounds Central nervous system: Alert and oriented. No focal neurological deficits. Extremities: Decreased power bilateral lower extremities.  Gait not assessed Skin: No rashes, lesions or ulcers Psychiatry: Judgement and insight appear normal. Mood & affect appropriate.     Data Reviewed: I have personally reviewed following labs and imaging studies  CBC: Recent Labs  Lab 12/08/22 1733 12/09/22 0213 12/14/22 1541  WBC 5.7 5.4 4.4  NEUTROABS  --   --  2.9  HGB 11.8* 10.1* 10.7*  HCT 34.8* 30.7* 32.9*  MCV 87.4 89.5 89.6  PLT 136* 136* 397   Basic Metabolic Panel: Recent Labs  Lab 12/08/22 1733 12/09/22 0213 12/10/22 0517  NA 137  --   --   K 3.7  --   --   CL 103  --   --   CO2 23  --   --   GLUCOSE 112*  --   --   BUN 12  --   --   CREATININE 0.63 0.65 0.52  CALCIUM 8.3*  --   --    GFR: Estimated Creatinine Clearance: 60.5 mL/min (by C-G formula based on SCr of 0.52 mg/dL). Liver Function Tests: Recent Labs  Lab 12/08/22 1733  AST 38  ALT 39  ALKPHOS 58  BILITOT 0.7  PROT 5.8*  ALBUMIN 2.5*   No results for input(s): "LIPASE", "AMYLASE" in the last 168 hours. No results for input(s): "  AMMONIA" in the last 168 hours. Coagulation Profile: Recent Labs  Lab 12/09/22 0653  INR 1.2   Cardiac Enzymes: Recent Labs  Lab 12/08/22 1733  CKTOTAL 39   BNP (last 3 results) No results for input(s): "PROBNP" in the last 8760 hours. HbA1C: No results for input(s): "HGBA1C" in the last 72 hours. CBG: Recent Labs  Lab 12/14/22 0742 12/14/22 1140 12/14/22 1707 12/14/22 2156 12/15/22 0818  GLUCAP 86 189* 128* 142* 92   Lipid Profile: No results for input(s): "CHOL", "HDL", "LDLCALC", "TRIG", "CHOLHDL", "LDLDIRECT" in the last 72 hours. Thyroid Function Tests: No results for input(s): "TSH", "T4TOTAL", "FREET4", "T3FREE", "THYROIDAB" in the last 72 hours.  Anemia Panel: No results for input(s):  "VITAMINB12", "FOLATE", "FERRITIN", "TIBC", "IRON", "RETICCTPCT" in the last 72 hours. Sepsis Labs: Recent Labs  Lab 12/08/22 1733 12/09/22 0213 12/09/22 0653  PROCALCITON 0.20  --   --   LATICACIDVEN  --  0.9 1.4    Recent Results (from the past 240 hour(s))  Blood culture (single)     Status: None   Collection Time: 12/09/22 12:18 AM   Specimen: BLOOD  Result Value Ref Range Status   Specimen Description BLOOD BLOOD RIGHT ARM  Final   Special Requests   Final    BOTTLES DRAWN AEROBIC AND ANAEROBIC Blood Culture results may not be optimal due to an excessive volume of blood received in culture bottles   Culture   Final    NO GROWTH 5 DAYS Performed at [facility], [address], [place], [place] [address]    Report Status 12/14/2022 FINAL  Final  SARS Coronavirus 2 by RT PCR (hospital order, performed in [facility] hospital lab) *cepheid single result test* Anterior Nasal Swab     Status: None   Collection Time: 12/09/22 12:18 AM   Specimen: Anterior Nasal Swab  Result Value Ref Range Status   SARS Coronavirus 2 by RT PCR NEGATIVE NEGATIVE Final    Comment: (NOTE) SARS-CoV-2 target nucleic acids are NOT DETECTED.  The SARS-CoV-2 RNA is generally detectable in upper and lower respiratory specimens during the acute phase of infection. The lowest concentration of SARS-CoV-2 viral copies this assay can detect is 250 copies / mL. A negative result does not preclude SARS-CoV-2 infection and should not be used as the sole basis for treatment or other patient management decisions.  A negative result may occur with improper specimen collection / handling, submission of specimen other than nasopharyngeal swab, presence of viral mutation(s) within the areas targeted by this assay, and inadequate number of viral copies (<250 copies / mL). A negative result must be combined with clinical observations, patient history, and epidemiological information.  Fact Sheet for  Patients:   RoadLapTop.co.za  Fact Sheet for Healthcare Providers: http://kim-miller.com/  This test is not yet approved or  cleared by the [place] FDA and has been authorized for detection and/or diagnosis of SARS-CoV-2 by FDA under an Emergency Use Authorization (EUA).  This EUA will remain in effect (meaning this test can be used) for the duration of the COVID-19 declaration under Section 564(b)(1) of the Act, 21 U.S.C. section 360bbb-3(b)(1), unless the authorization is terminated or revoked sooner.  Performed at [facility], [address] [place], [place] [address]   MRSA Next Gen by PCR, Nasal     Status: None   Collection Time: 12/09/22  2:31 PM   Specimen: Urine, Clean Catch; Nasal Swab  Result Value Ref Range Status   MRSA by PCR  Next Gen NOT DETECTED NOT DETECTED Final    Comment: (NOTE) The GeneXpert MRSA Assay (FDA approved for NASAL specimens only), is one component of a comprehensive MRSA colonization surveillance program. It is not intended to diagnose MRSA infection nor to guide or monitor treatment for MRSA infections. Test performance is not FDA approved in patients less than 79 years old. Performed at [facility], [address] [place], [place] [address]   Respiratory (~20 pathogens) panel by PCR     Status: None   Collection Time: 12/09/22  4:14 PM   Specimen: Nasopharyngeal Swab; Respiratory  Result Value Ref Range Status   Adenovirus NOT DETECTED NOT DETECTED Final   Coronavirus 229E NOT DETECTED NOT DETECTED Final    Comment: (NOTE) The Coronavirus on the Respiratory Panel, DOES NOT test for the novel  Coronavirus (2019 nCoV)    Coronavirus HKU1 NOT DETECTED NOT DETECTED Final   Coronavirus NL63 NOT DETECTED NOT DETECTED Final   Coronavirus OC43 NOT DETECTED NOT DETECTED Final   Metapneumovirus NOT DETECTED NOT DETECTED Final   Rhinovirus / Enterovirus NOT DETECTED NOT  DETECTED Final   Influenza A NOT DETECTED NOT DETECTED Final   Influenza B NOT DETECTED NOT DETECTED Final   Parainfluenza Virus 1 NOT DETECTED NOT DETECTED Final   Parainfluenza Virus 2 NOT DETECTED NOT DETECTED Final   Parainfluenza Virus 3 NOT DETECTED NOT DETECTED Final   Parainfluenza Virus 4 NOT DETECTED NOT DETECTED Final   Respiratory Syncytial Virus NOT DETECTED NOT DETECTED Final   Bordetella pertussis NOT DETECTED NOT DETECTED Final   Bordetella Parapertussis NOT DETECTED NOT DETECTED Final   Chlamydophila pneumoniae NOT DETECTED NOT DETECTED Final   Mycoplasma pneumoniae NOT DETECTED NOT DETECTED Final    Comment: Performed at [facility], [address], [place], [place] [address]         Radiology Studies: No results found.      Scheduled Meds:  cholecalciferol  1,000 Units Oral Daily   insulin aspart  0-15 Units Subcutaneous TID WC   latanoprost  1 drop Right Eye QHS   mirtazapine  15 mg Oral QHS   multivitamin with minerals  1 tablet Oral Daily   pantoprazole  40 mg Oral BID   rosuvastatin  10 mg Oral Daily   sucralfate  1 g Oral TID WC & HS   Continuous Infusions:     LOS: 5 days      Tresa Moore, MD [facility]   If 7PM-7AM, please contact night-coverage  12/15/2022, 10:41 AM

## 2022-12-15 NOTE — Plan of Care (Signed)

## 2022-12-15 NOTE — TOC Progression Note (Signed)
Transition of Care [facility]) - Progression Note    Patient Details  Name: Vickie Stewart MRN: [id number] Date of Birth: May 03, 1946  Transition of Care [facility] CM/SW Contact  Marlowe Sax, RN Phone Number: 12/15/2022, 10:50 AM  Clinical Narrative:    The patient is pleased that she has a bed from [facility], PASSR obtained [id number], Ins pending ref number [id number]   Expected Discharge Plan: Skilled Nursing Facility Barriers to Discharge: No Barriers Identified  Expected Discharge Plan and Services   Discharge Planning Services: CM Consult Post Acute Care Choice: Skilled Nursing Facility Living arrangements for the past 2 months: Single Family Home                                       Social Determinants of Health (SDOH) Interventions SDOH Screenings   Food Insecurity: No Food Insecurity (12/09/2022)  Housing: Low Risk  (12/09/2022)  Transportation Needs: No Transportation Needs (12/09/2022)  Utilities: Not At Risk (12/09/2022)  Alcohol Screen: Low Risk  (09/03/2021)  Depression (PHQ2-9): Low Risk  (09/28/2022)  Financial Resource Strain: Low Risk  (09/03/2021)  Physical Activity: Sufficiently Active (09/03/2021)  Social Connections: Moderately Integrated (09/03/2021)  Stress: No Stress Concern Present (09/03/2021)  Tobacco Use: Low Risk  (12/09/2022)    Readmission Risk Interventions    12/11/2022    3:34 PM  Readmission Risk Prevention Plan  Post Dischage Appt Complete  Medication Screening Complete  Transportation Screening Complete

## 2022-12-15 NOTE — Progress Notes (Signed)
Nurse called report over to [facility]. Nurse left a message with Lajoyce Corners to return the call so report can be called over.

## 2022-12-15 NOTE — Progress Notes (Signed)
Nurse called to give report to [facility]. Report was given to [facility].

## 2022-12-15 NOTE — TOC Progression Note (Signed)
Transition of Care [facility]) - Progression Note    Patient Details  Name: Vickie Stewart MRN: [id number] Date of Birth: Apr 02, 1946  [facility] of [facility]) CM/SW Contact  Marlowe Sax, RN Phone Number: 12/15/2022, 3:19 PM  Clinical Narrative:     [facility] going to [facility] to room [facility] she is aware and will let her family know EMS called and she is next on the list to transport  Expected Discharge Plan: Skilled Nursing Facility Barriers to Discharge: No Barriers Identified  Expected Discharge Plan and Services   Discharge Planning Services: CM Consult Post Acute Care Choice: Skilled Nursing Facility Living arrangements for the past 2 months: Single Family Home Expected Discharge Date: 12/15/22                                     Social Determinants of Health (SDOH) Interventions SDOH Screenings   Food Insecurity: No Food Insecurity (12/09/2022)  Housing: Low Risk  (12/09/2022)  Transportation Needs: No Transportation Needs (12/09/2022)  Utilities: Not At Risk (12/09/2022)  Alcohol Screen: Low Risk  (09/03/2021)  Depression (PHQ2-9): Low Risk  (09/28/2022)  Financial Resource Strain: Low Risk  (09/03/2021)  Physical Activity: Sufficiently Active (09/03/2021)  Social Connections: Moderately Integrated (09/03/2021)  Stress: No Stress Concern Present (09/03/2021)  Tobacco Use: Low Risk  (12/09/2022)    Readmission Risk Interventions    12/11/2022    3:34 PM  Readmission Risk Prevention Plan  Post Dischage Appt Complete  Medication Screening Complete  Transportation Screening Complete

## 2022-12-15 NOTE — Progress Notes (Signed)
PULMONOLOGY         Date: 12/15/2022,   MRN# [id number] Vickie Stewart Feb 15, 1946     AdmissionWeight: 82.6 kg                 CurrentWeight: 82.6 kg  Referring provider: Dr Georgeann Oppenheim    CHIEF COMPLAINT:   Recurrent lung infiltrates   HISTORY OF PRESENT ILLNESS   This is a 77 yo F with hx of meningioma, GERD, HTN who came in for leg weakness. She had brain imaging with no signs of stroke. 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 She was found to be hypoxemic on arrival to hospital. CTA showed no PE but +bilateral infiltrates.  She had viral workup done which was negative. PCCM for further evaluation and management.   12/14/22- patient seen and examined at bedside, she is on room air with stable vital signs.  CRP is markedly elevated.   12/15/22- patient reports feeling better, she's not wheezing as much as before.  She's been out of bed and  walked to bathroom, she was able to do it and then compelted PT activities also.  We discussed increased inflammatory biomarkers, patient declines steroids therapy. Thus far workup is negative. Ive asked patient to produce another resp specimen for micro.  Overall she's improved quite a bit.   PAST MEDICAL HISTORY   Past Medical History:  Diagnosis Date   Allergic rhinitis, cause unspecified    Anxiety state, unspecified    Contact dermatitis and other eczema, due to unspecified cause    Diffuse cystic mastopathy    Dyspepsia and other specified disorders of function of stomach    Essential hypertension, benign    Glaucoma    Irritable bowel syndrome    Lateral epicondylitis  of elbow    Leukocytopenia, unspecified    Mastodynia    Obesity, unspecified    Pure hypercholesterolemia    Reflux esophagitis    Thoracic or lumbosacral neuritis or radiculitis, unspecified    Unspecified disorder of skin and subcutaneous tissue    Vaginal cancer (HCC)    History      SURGICAL HISTORY   Past Surgical History:  Procedure Laterality Date   BREAST EXCISIONAL BIOPSY Left    neg   BREAST LUMPECTOMY Left    COLONOSCOPY  08/26/2006   COLONOSCOPY WITH PROPOFOL N/A 10/21/2016   Procedure: COLONOSCOPY WITH PROPOFOL;  Surgeon: Kieth Brightly, MD;  Location: [facility] ENDOSCOPY;  Service: Endoscopy;  Laterality: N/A;   ESOPHAGOGASTRODUODENOSCOPY (EGD) WITH PROPOFOL N/A 02/07/2020   Procedure: ESOPHAGOGASTRODUODENOSCOPY (EGD) WITH PROPOFOL;  Surgeon: Toney Reil, MD;  Location: [facility] ENDOSCOPY;  Service: Gastroenterology;  Laterality: N/A;     FAMILY HISTORY   Family History  Problem Relation Age of Onset   Pulmonary embolism Mother    Hypertension Father    Aortic stenosis Father    Breast cancer Neg Hx      SOCIAL HISTORY   Social History   Tobacco Use   Smoking status: Never   Smokeless tobacco: Never   Tobacco comments:    experimented with dip snuff as a child  Vaping Use   Vaping status: Never Used  Substance Use Topics   Alcohol use: No   Drug use: No     MEDICATIONS    Home Medication:    Current Medication:  Current Facility-Administered Medications:    cholecalciferol (VITAMIN D3) 25 MCG (1000 UNIT) tablet 1,000 Units,  1,000 Units, Oral, Daily, Gertha Calkin, MD, 1,000 Units at 12/14/22 0914   insulin aspart (novoLOG) injection 0-15 Units, 0-15 Units, Subcutaneous, TID WC, Gertha Calkin, MD, 2 Units at 12/14/22 1709   latanoprost (XALATAN) 0.005 % ophthalmic solution 1 drop, 1 drop, Right Eye, QHS, Sreenath, Sudheer B, MD, 1 drop at 12/14/22 2100   mirtazapine (REMERON) tablet 15 mg, 15 mg, Oral, QHS, Sreenath, Sudheer B, MD, 15 mg at 12/14/22 2059   multivitamin with minerals tablet 1 tablet, 1 tablet, Oral, Daily, Georgeann Oppenheim, Sudheer B, MD, 1 tablet at 12/14/22 0914   pantoprazole (PROTONIX) EC tablet 40 mg, 40 mg, Oral, BID, Sreenath, Sudheer B, MD   phenol (CHLORASEPTIC) mouth spray 1 spray, 1 spray,  Mouth/Throat, PRN, Sreenath, Sudheer B, MD   rosuvastatin (CRESTOR) tablet 10 mg, 10 mg, Oral, Daily, Sreenath, Sudheer B, MD, 10 mg at 12/14/22 0914   sucralfate (CARAFATE) tablet 1 g, 1 g, Oral, TID WC & HS, Sreenath, Sudheer B, MD, 1 g at 12/14/22 2059    ALLERGIES   Atorvastatin, Terbinafine and related, and Zetia [ezetimibe]     REVIEW OF SYSTEMS    Review of Systems:  Gen:  Denies  fever, sweats, chills weigh loss  HEENT: Denies blurred vision, double vision, ear pain, eye pain, hearing loss, nose bleeds, sore throat Cardiac:  No dizziness, chest pain or heaviness, chest tightness,edema Resp:   reports dyspnea chronically  Gi: Denies swallowing difficulty, stomach pain, nausea or vomiting, diarrhea, constipation, bowel incontinence Gu:  Denies bladder incontinence, burning urine Ext:   Denies Joint pain, stiffness or swelling Skin: Denies  skin rash, easy bruising or bleeding or hives Endoc:  Denies polyuria, polydipsia , polyphagia or weight change Psych:   Denies depression, insomnia or hallucinations   Other:  All other systems negative   VS: BP 127/67 (BP Location: Left Arm)   Pulse 78   Temp 97.6 F (36.4 C)   Resp 16   Ht 5\' 2"  (1.575 m)   Wt 82.6 kg   SpO2 97%   BMI 33.29 kg/m      PHYSICAL EXAM    GENERAL:NAD, no fevers, chills, no weakness no fatigue HEAD: Normocephalic, atraumatic.  EYES: Pupils equal, round, reactive to light. Extraocular muscles intact. No scleral icterus.  MOUTH: Moist mucosal membrane. Dentition intact. No abscess noted.  EAR, NOSE, THROAT: Clear without exudates. No external lesions.  NECK: Supple. No thyromegaly. No nodules. No JVD.  PULMONARY: decreased breath sounds with mild rhonchi worse at bases bilaterally.  CARDIOVASCULAR: S1 and S2. Regular rate and rhythm. No murmurs, rubs, or gallops. No edema. Pedal pulses 2+ bilaterally.  GASTROINTESTINAL: Soft, nontender, nondistended. No masses. Positive bowel sounds. No  hepatosplenomegaly.  MUSCULOSKELETAL: No swelling, clubbing, or edema. Range of motion full in all extremities.  NEUROLOGIC: Cranial nerves II through XII are intact. No gross focal neurological deficits. Sensation intact. Reflexes intact.  SKIN: No ulceration, lesions, rashes, or cyanosis. Skin warm and dry. Turgor intact.  PSYCHIATRIC: Mood, affect within normal limits. The patient is awake, alert and oriented x 3. Insight, judgment intact.       IMAGING   Reviewed CT chest    ASSESSMENT/PLAN   Acute hypoxemic respiratory failure - present on admission -need to rule out active infection, currently patient on room air and without leukocytosis and does have normal vital signs, can perform below workup and review upon follow up in outpatient clinic.  - COVID19 negative  - supplemental O2 during my evaluation  room air - will perform infectious workup for pneumonia -Respiratory viral panel-negative -serum fungitell-positive (note at times this  may be due to drug interaction such as antibiotics and lab error consider pharmacology consultation) -cryptococcal, blastomyces, galctomanan, histo urinary ag, aspergillus, fungal blood culture ordered -legionella ab-negative -strep pneumoniae ur AG-negative -Histoplasma Ur Ag-in process -sputum resp cultures-in process -AFB sputum expectorated specimen -reviewed pertinent imaging with patient today - ESRelevated, crp ordered -Patient received Radiation therapy x 30 dose as recent as June 2024 and likely does have some residual pneumonitis at this time. She appears to be sensitive to steroids so I would continue off steroids since she's on room air now.  -PT/OT for d/c planning  -please encourage patient to use incentive spirometer few times each hour while hospitalized.           Thank you for allowing me to participate in the care of this patient.   Patient/Family are satisfied with care plan and all questions have been answered.     Provider disclosure: Patient with at least one acute or chronic illness or injury that poses a threat to life or bodily function and is being managed actively during this encounter.  All of the below services have been performed independently by signing provider:  review of prior documentation from internal and or external health records.  Review of previous and current lab results.  Interview and comprehensive assessment during patient visit today. Review of current and previous chest radiographs/CT scans. Discussion of management and test interpretation with health care team and patient/family.   This document was prepared using Dragon voice recognition software and may include unintentional dictation errors.     Vida Rigger, M.D.  Division of Pulmonary & Critical Care Medicine

## 2022-12-15 NOTE — Progress Notes (Signed)
Physical Therapy Treatment Patient Details Name: Vickie Stewart MRN: [id number] DOB: [birth year] Today's Date: 12/15/2022   History of Present Illness Pt is a 77 y/o F admitted on 12/08/22 after presenting with c/o BLE weakness. MRI showed "small enhancing lesions in the right posterior elements at T6 & T10, as well as possibly at L1." PMH: meningioma of the right parafalcine region s/p IMRT, HTN, HLD, anxiety, glaucoma, obesity, vaginal CA, thoracic or lumbosacral neuritis or radiculitis    PT Comments  Patient received in bed, she is reporting back pain and does not want to sit in recliner, but will get up and walk. Patient requires min A for bed mobility and transfers. She ambulated 80 feet with RW and min guard. Fatigued with ambulation, but no lob. O2 sats on room air while ambulating were at 85%. Patient returned to [facility] upon returning to room. Patient will continue to benefit from skilled PT to improve functional independence and safety.       Assistance Recommended at Discharge Intermittent Supervision/Assistance  If plan is discharge home, recommend the following:  Can travel by private vehicle    A little help with walking and/or transfers;A little help with bathing/dressing/bathroom;Assistance with cooking/housework;Help with stairs or ramp for entrance;Assist for transportation   Yes  Equipment Recommendations  Rolling walker (2 wheels)    Recommendations for Other Services       Precautions / Restrictions Precautions Precautions: Fall Restrictions Weight Bearing Restrictions: No     Mobility  Bed Mobility Overal bed mobility: Needs Assistance Bed Mobility: Rolling, Sidelying to Sit Rolling: Min assist Sidelying to sit: Mod assist       General bed mobility comments: due to back pain patient log rolled to sidelying for comfort.    Transfers Overall transfer level: Needs assistance Equipment used: Rolling walker (2 wheels), 1 person hand held  assist Transfers: Sit to/from Stand, Bed to chair/wheelchair/BSC Sit to Stand: Min guard   Step pivot transfers: Min assist       General transfer comment: Patient pivoted to [facility] from bed with +1 assist. Then stood from [facility] to RW with min guard    Ambulation/Gait Ambulation/Gait assistance: Min guard Gait Distance (Feet): 80 Feet Assistive device: Rolling walker (2 wheels) Gait Pattern/deviations: Decreased step length - right, Decreased step length - left, Decreased stride length Gait velocity: decreased     General Gait Details: increased lateral sway during ambulation but no lob. Fatigued with ambulation, O2 sats on room air ambulating at 85%   Stairs             Wheelchair Mobility     Tilt Bed    Modified Rankin (Stroke Patients Only)       Balance Overall balance assessment: Needs assistance Sitting-balance support: Feet supported Sitting balance-Leahy Scale: Good Sitting balance - Comments: peri hygiene sitting on toilet with supervision   Standing balance support: Bilateral upper extremity supported, During functional activity, Reliant on assistive device for balance Standing balance-Leahy Scale: Fair                              Cognition Arousal/Alertness: Awake/alert Behavior During Therapy: WFL for tasks assessed/performed Overall Cognitive Status: Within Functional Limits for tasks assessed  Exercises      General Comments        Pertinent Vitals/Pain Pain Assessment Pain Assessment: Faces Faces Pain Scale: Hurts even more Pain Location: back Pain Descriptors / Indicators: Discomfort, Grimacing Pain Intervention(s): Monitored during session, Repositioned    Home Living                          Prior Function            PT Goals (current goals can now be found in the care plan section) Acute Rehab PT Goals Patient Stated Goal: to be able to walk  before she goes home PT Goal Formulation: With patient Time For Goal Achievement: 12/23/22 Potential to Achieve Goals: Good Progress towards PT goals: Progressing toward goals    Frequency    Min 1X/week      PT Plan Discharge plan needs to be updated    Co-evaluation              AM-PAC PT "6 Clicks" Mobility   Outcome Measure  Help needed turning from your back to your side while in a flat bed without using bedrails?: A Little Help needed moving from lying on your back to sitting on the side of a flat bed without using bedrails?: A Little Help needed moving to and from a bed to a chair (including a wheelchair)?: A Little Help needed standing up from a chair using your arms (e.g., wheelchair or bedside chair)?: A Little Help needed to walk in hospital room?: A Little Help needed climbing 3-5 steps with a railing? : A Lot 6 Click Score: 17    End of Session Equipment Utilized During Treatment: Gait belt;Oxygen Activity Tolerance: Patient limited by fatigue Patient left: in bed;with call bell/phone within reach;with bed alarm set Nurse Communication: Mobility status PT Visit Diagnosis: Other abnormalities of gait and mobility (R26.89);Muscle weakness (generalized) (M62.81);Difficulty in walking, not elsewhere classified (R26.2);Pain Pain - part of body:  (back)     Time: 1610-9604 PT Time Calculation (min) (ACUTE ONLY): 34 min  Charges:    $Gait Training: 8-22 mins $Therapeutic Activity: 8-22 mins PT General Charges $$ ACUTE PT VISIT: 1 Visit                      , PT, [facility] 12/15/22,12:26 PM

## 2022-12-15 NOTE — Care Management Important Message (Signed)
Important Message  Patient Details  Name: Vickie Stewart MRN: [id number] Date of Birth: May 23, 1946   Medicare Important Message Given:  N/A - LOS <3 / Initial given by admissions     Olegario Messier A  12/15/2022, 10:58 AM

## 2022-12-16 DIAGNOSIS — G4733 Obstructive sleep apnea (adult) (pediatric): Secondary | ICD-10-CM | POA: Diagnosis not present

## 2022-12-16 DIAGNOSIS — M6281 Muscle weakness (generalized): Secondary | ICD-10-CM | POA: Diagnosis not present

## 2022-12-16 DIAGNOSIS — D329 Benign neoplasm of meninges, unspecified: Secondary | ICD-10-CM | POA: Diagnosis not present

## 2022-12-16 DIAGNOSIS — D696 Thrombocytopenia, unspecified: Secondary | ICD-10-CM | POA: Diagnosis not present

## 2022-12-16 LAB — BLASTOMYCES ANTIGEN: Blastomyces Antigen: NOT DETECTED ng/mL

## 2022-12-17 DIAGNOSIS — D329 Benign neoplasm of meninges, unspecified: Secondary | ICD-10-CM | POA: Diagnosis not present

## 2022-12-17 DIAGNOSIS — M6281 Muscle weakness (generalized): Secondary | ICD-10-CM | POA: Diagnosis not present

## 2022-12-17 LAB — ASPERGILLUS ANTIGEN, BAL/SERUM: Aspergillus Ag, BAL/Serum: 0.05 Index (ref 0.00–0.49)

## 2022-12-17 LAB — HISTOPLASMA GAL'MANNAN AG SER: Histoplasma Gal'mannan Ag Ser: NEGATIVE (ref <0.5)

## 2022-12-19 LAB — CULTURE, FUNGUS WITHOUT SMEAR

## 2022-12-21 ENCOUNTER — Ambulatory Visit: Payer: Medicare PPO | Attending: Radiation Oncology | Admitting: Radiation Oncology

## 2022-12-21 DIAGNOSIS — D696 Thrombocytopenia, unspecified: Secondary | ICD-10-CM | POA: Diagnosis not present

## 2022-12-21 DIAGNOSIS — M6281 Muscle weakness (generalized): Secondary | ICD-10-CM | POA: Diagnosis not present

## 2022-12-21 DIAGNOSIS — D320 Benign neoplasm of cerebral meninges: Secondary | ICD-10-CM | POA: Insufficient documentation

## 2022-12-21 DIAGNOSIS — Z510 Encounter for antineoplastic radiation therapy: Secondary | ICD-10-CM | POA: Insufficient documentation

## 2022-12-21 DIAGNOSIS — E440 Moderate protein-calorie malnutrition: Secondary | ICD-10-CM | POA: Diagnosis not present

## 2022-12-21 DIAGNOSIS — D649 Anemia, unspecified: Secondary | ICD-10-CM | POA: Diagnosis not present

## 2022-12-22 LAB — CULTURE, FUNGUS WITHOUT SMEAR

## 2022-12-22 NOTE — Telephone Encounter (Signed)
Left message to call back  

## 2022-12-24 DIAGNOSIS — D649 Anemia, unspecified: Secondary | ICD-10-CM | POA: Diagnosis not present

## 2022-12-24 DIAGNOSIS — R051 Acute cough: Secondary | ICD-10-CM | POA: Diagnosis not present

## 2022-12-24 DIAGNOSIS — D696 Thrombocytopenia, unspecified: Secondary | ICD-10-CM | POA: Diagnosis not present

## 2022-12-24 DIAGNOSIS — D509 Iron deficiency anemia, unspecified: Secondary | ICD-10-CM | POA: Diagnosis not present

## 2022-12-25 ENCOUNTER — Ambulatory Visit: Payer: Medicare PPO

## 2022-12-25 DIAGNOSIS — J189 Pneumonia, unspecified organism: Secondary | ICD-10-CM | POA: Diagnosis not present

## 2022-12-25 DIAGNOSIS — M6281 Muscle weakness (generalized): Secondary | ICD-10-CM | POA: Diagnosis not present

## 2022-12-25 DIAGNOSIS — D329 Benign neoplasm of meninges, unspecified: Secondary | ICD-10-CM | POA: Diagnosis not present

## 2022-12-25 DIAGNOSIS — D509 Iron deficiency anemia, unspecified: Secondary | ICD-10-CM | POA: Diagnosis not present

## 2022-12-25 NOTE — Progress Notes (Deleted)
Name: Vickie Stewart   MRN: [id number]    DOB: 18-Apr-1946   Date:12/25/2022       Progress Note  Subjective  Chief Complaint  Annual Exam  HPI  Patient presents for annual CPE.  Diet: *** Exercise: ***  Last Eye Exam: *** Last Dental Exam: ***  Flowsheet Row Office Visit from 09/03/2021 in [facility]  AUDIT-C Score 0      Depression: Phq 9 is  {Desc; negative/positive:13464}    09/28/2022   10:54 AM 07/10/2022   10:41 AM 01/07/2022   10:34 AM 10/28/2021    2:29 PM 09/03/2021    1:52 PM  Depression screen PHQ 2/9  Decreased Interest 0 0 0 0 0  Down, Depressed, Hopeless 0 0 0 0 0  PHQ - 2 Score 0 0 0 0 0  Altered sleeping 0 0  0 0  Tired, decreased energy 0 0  0 0  Change in appetite 0 0  0 0  Feeling bad or failure about yourself  0 0  0 0  Trouble concentrating 0 0  0 0  Moving slowly or fidgety/restless 0 0  0 0  Suicidal thoughts 0 0  0 0  PHQ-9 Score 0 0  0 0  Difficult doing work/chores    Not difficult at all    Hypertension: BP Readings from Last 3 Encounters:  12/15/22 (!) 101/52  12/05/22 101/71  09/28/22 136/68   Obesity: Wt Readings from Last 3 Encounters:  12/08/22 182 lb (82.6 kg)  12/04/22 173 lb (78.5 kg)  09/28/22 180 lb (81.6 kg)   BMI Readings from Last 3 Encounters:  12/08/22 33.29 kg/m  12/04/22 31.64 kg/m  09/28/22 34.01 kg/m     Vaccines:   HPV: N/A Tdap:  Shingrix:  Pneumonia:  Flu:  COVID-19:   Hep C Screening: 04/26/13 STD testing and prevention (HIV/chl/gon/syphilis): N/A Intimate partner violence: negative screen  Sexual History : Menstrual History/LMP/Abnormal Bleeding:  Discussed importance of follow up if any post-menopausal bleeding: yes  Incontinence Symptoms: negative for symptoms   Breast cancer:  - Last Mammogram: ordered 10/12/22 - BRCA gene screening: N/A  Osteoporosis Prevention : Discussed high calcium and vitamin D supplementation, weight bearing exercises Bone  density: up to date  Cervical cancer screening: N/A  Skin cancer: Discussed monitoring for atypical lesions  Colorectal cancer: 08/21/16   Lung cancer:  Low Dose CT Chest recommended if Age 50-80 years, 20 pack-year currently smoking OR have quit w/in 15years. Patient does not qualify for screen   ECG: 12/11/22  Advanced Care Planning: A voluntary discussion about advance care planning including the explanation and discussion of advance directives.  Discussed health care proxy and Living will, and the patient was able to identify a health care proxy as ***.  Patient does not have a living will and power of attorney of health care   Lipids: Lab Results  Component Value Date   CHOL 124 09/28/2022   CHOL 158 09/03/2021   CHOL 156 08/13/2020   Lab Results  Component Value Date   HDL 50 09/28/2022   HDL 57 09/03/2021   HDL 64 08/13/2020   Lab Results  Component Value Date   LDLCALC 55 09/28/2022   LDLCALC 84 09/03/2021   LDLCALC 77 08/13/2020   Lab Results  Component Value Date   TRIG 102 09/28/2022   TRIG 81 09/03/2021   TRIG 66 08/13/2020   Lab Results  Component Value Date   CHOLHDL 2.5  09/28/2022   CHOLHDL 2.8 09/03/2021   CHOLHDL 2.4 08/13/2020     Patient Active Problem List   Diagnosis Date Noted   Inability to ambulate due to multiple joints 12/10/2022   PNA (pneumonia) 12/09/2022   Acute respiratory failure with hypoxia (HCC) 12/09/2022   Bilateral leg weakness 12/09/2022   Lower extremity weakness 12/04/2022   Thrombocytopenia (HCC) 12/04/2022   Pre-diabetes 12/04/2022   ASCUS of cervix with negative high risk HPV 01/07/2022   Occipital neuralgia of left side 01/07/2022   Balance problems 01/07/2022   Vertigo 01/07/2022   Pulsatile tinnitus of right ear 06/18/2020   Pulsatile neck mass 06/18/2020   OSA (obstructive sleep apnea) 09/19/2019   Anxiety and depression 09/19/2019   Other neutropenia (HCC) 08/11/2019   Left thyroid nodule 08/08/2019    Chronic neck and back pain 01/03/2019   Meningioma (HCC) 10/04/2017   Lichen sclerosus 03/24/2016   Vaginal atrophy 03/18/2016   Encounter for follow-up surveillance of vaginal cancer 03/18/2016   Eczema 10/31/2015   Left hand pain 09/10/2015   Osteopenia after menopause 07/26/2015   Menopause 07/26/2015   Irritable colon 07/26/2015   History of cancer of vagina 07/26/2015   Diffuse cystic mastopathy 07/26/2015   Obesity (BMI 30.0-34.9) 07/26/2015   Intermittent low back pain 07/26/2015   Hyperlipemia 07/26/2015   Osteoarthritis 05/13/2015   Allergic rhinitis 05/13/2015   Gastroesophageal reflux disease without esophagitis 05/13/2015   Essential hypertension, benign     Past Surgical History:  Procedure Laterality Date   BREAST EXCISIONAL BIOPSY Left    neg   BREAST LUMPECTOMY Left    COLONOSCOPY  08/26/2006   COLONOSCOPY WITH PROPOFOL N/A 10/21/2016   Procedure: COLONOSCOPY WITH PROPOFOL;  Surgeon: Kieth Brightly, MD;  Location: [facility] ENDOSCOPY;  Service: Endoscopy;  Laterality: N/A;   ESOPHAGOGASTRODUODENOSCOPY (EGD) WITH PROPOFOL N/A 02/07/2020   Procedure: ESOPHAGOGASTRODUODENOSCOPY (EGD) WITH PROPOFOL;  Surgeon: Toney Reil, MD;  Location: [facility] ENDOSCOPY;  Service: Gastroenterology;  Laterality: N/A;    Family History  Problem Relation Age of Onset   Pulmonary embolism Mother    Hypertension Father    Aortic stenosis Father    Breast cancer Neg Hx     Social History   Socioeconomic History   Marital status: Widowed    Spouse name: Not on file   Number of children: 0   Years of education: Not on file   Highest education level: Some college, no degree  Occupational History   Occupation: Aeronautical engineer: [employer]  Tobacco Use   Smoking status: Never   Smokeless tobacco: Never   Tobacco comments:    experimented with dip snuff as a child  Vaping Use   Vaping status: Never Used  Substance and Sexual Activity   Alcohol use: No   Drug use: No    Sexual activity: Not Currently    Birth control/protection: Post-menopausal  Other Topics Concern   Not on file  Social History Narrative   Not on file   Social Determinants of Health   Financial Resource Strain: Low Risk  (09/03/2021)   Overall Financial Resource Strain (CARDIA)    Difficulty of Paying Living Expenses: Not hard at all  Food Insecurity: No Food Insecurity (12/09/2022)   Hunger Vital Sign    Worried About Running Out of Food in the Last Year: Never true    Ran Out of Food in the Last Year: Never true  Transportation Needs: No Transportation Needs (12/09/2022)   PRAPARE -  Administrator, Civil Service (Medical): No    Lack of Transportation (Non-Medical): No  Physical Activity: Sufficiently Active (09/03/2021)   Exercise Vital Sign    Days of Exercise per Week: 5 days    Minutes of Exercise per Session: 30 min  Stress: No Stress Concern Present (09/03/2021)   [employer] of Occupational Health - Occupational Stress Questionnaire    Feeling of Stress : Not at all  Social Connections: Moderately Integrated (09/03/2021)   Social Connection and Isolation Panel [NHANES]    Frequency of Communication with Friends and Family: More than three times a week    Frequency of Social Gatherings with Friends and Family: Three times a week    Attends Religious Services: More than 4 times per year    Active Member of Clubs or Organizations: Yes    Attends Banker Meetings: 1 to 4 times per year    Marital Status: Widowed  Intimate Partner Violence: Not At Risk (12/09/2022)   Humiliation, Afraid, Rape, and Kick questionnaire    Fear of Current or Ex-Partner: No    Emotionally Abused: No    Physically Abused: No    Sexually Abused: No     Current Outpatient Medications:    aspirin 81 MG tablet, Take 81 mg by mouth daily. Reported on 10/10/2015, Disp: , Rfl:    cholecalciferol (VITAMIN D) 1000 units tablet, Take 1,000 Units by mouth daily., Disp: , Rfl:     latanoprost (XALATAN) 0.005 % ophthalmic solution, PLACE 1 DROP IN BOTH EYES AT BEDTIME, Disp: , Rfl: 4   magnesium oxide (MAG-OX) 400 (240 Mg) MG tablet, Take 400 mg by mouth daily., Disp: , Rfl:    mirtazapine (REMERON) 15 MG tablet, Take 1 tablet (15 mg total) by mouth at bedtime., Disp: , Rfl:    Multiple Vitamin (MULTIVITAMIN WITH MINERALS) TABS tablet, Take 1 tablet by mouth daily., Disp: 30 tablet, Rfl: 0   Omega-3 Fatty Acids (FISH OIL CONCENTRATE PO), Take by mouth. Reported on 10/10/2015, Disp: , Rfl:    omeprazole (PRILOSEC) 20 MG capsule, Take 1 capsule (20 mg total) by mouth 2 (two) times daily before a meal., Disp: , Rfl:    rosuvastatin (CRESTOR) 10 MG tablet, Take 1 tablet (10 mg total) by mouth daily., Disp: 90 tablet, Rfl: 3   sucralfate (CARAFATE) 1 g tablet, Take 1 tablet (1 g total) by mouth in the morning, at noon, and at bedtime., Disp: 90 tablet, Rfl: 2  Allergies  Allergen Reactions   Atorvastatin     difficulty concentrating and focusing   Terbinafine And Related Dermatitis    Rash, itch, skin discoloration   Zetia [Ezetimibe] Other (See Comments)    Muscle/joint pain     ROS  ***  Objective  There were no vitals filed for this visit.  There is no height or weight on file to calculate BMI.  Physical Exam ***   Fall Risk:    09/28/2022   10:53 AM 07/10/2022   10:40 AM 01/07/2022   10:34 AM 10/28/2021    2:27 PM 09/03/2021    1:52 PM  Fall Risk   Falls in the past year? 0 0 0 1 1  Number falls in past yr: 0  0 0 0  Injury with Fall? 0  0 1 1  Risk for fall due to : No Fall Risks No Fall Risks No Fall Risks    Follow up Falls prevention discussed Falls prevention discussed;Education provided;Falls evaluation  completed Falls prevention discussed  Falls prevention discussed     Functional Status Survey:     Assessment & Plan  1. Well adult exam ***   -USPSTF grade A and B recommendations reviewed with patient; age-appropriate  recommendations, preventive care, screening tests, etc discussed and encouraged; healthy living encouraged; see AVS for patient education given to patient -Discussed importance of 150 minutes of physical activity weekly, eat two servings of fish weekly, eat one serving of tree nuts ( cashews, pistachios, pecans, almonds.Marland Kitchen) every other day, eat 6 servings of fruit/vegetables daily and drink plenty of water and avoid sweet beverages.   -Reviewed Health Maintenance: Yes.

## 2022-12-28 ENCOUNTER — Encounter: Payer: Medicare PPO | Admitting: Family Medicine

## 2022-12-28 DIAGNOSIS — D329 Benign neoplasm of meninges, unspecified: Secondary | ICD-10-CM | POA: Diagnosis not present

## 2022-12-28 DIAGNOSIS — J9601 Acute respiratory failure with hypoxia: Secondary | ICD-10-CM | POA: Diagnosis not present

## 2022-12-28 DIAGNOSIS — Z0000 Encounter for general adult medical examination without abnormal findings: Secondary | ICD-10-CM

## 2022-12-28 DIAGNOSIS — D696 Thrombocytopenia, unspecified: Secondary | ICD-10-CM | POA: Diagnosis not present

## 2022-12-28 DIAGNOSIS — I700 Atherosclerosis of aorta: Secondary | ICD-10-CM | POA: Diagnosis not present

## 2022-12-28 DIAGNOSIS — E7800 Pure hypercholesterolemia, unspecified: Secondary | ICD-10-CM | POA: Diagnosis not present

## 2022-12-28 DIAGNOSIS — I119 Hypertensive heart disease without heart failure: Secondary | ICD-10-CM | POA: Diagnosis not present

## 2022-12-28 DIAGNOSIS — D630 Anemia in neoplastic disease: Secondary | ICD-10-CM | POA: Diagnosis not present

## 2022-12-28 DIAGNOSIS — E440 Moderate protein-calorie malnutrition: Secondary | ICD-10-CM | POA: Diagnosis not present

## 2022-12-28 DIAGNOSIS — F411 Generalized anxiety disorder: Secondary | ICD-10-CM | POA: Diagnosis not present

## 2022-12-28 NOTE — Telephone Encounter (Signed)
Left message to call back  

## 2022-12-28 NOTE — Telephone Encounter (Signed)
Patient requested that we call her back in 3 months. She is currently not able to walk.

## 2022-12-30 DIAGNOSIS — G4733 Obstructive sleep apnea (adult) (pediatric): Secondary | ICD-10-CM | POA: Diagnosis not present

## 2022-12-31 ENCOUNTER — Encounter: Payer: Self-pay | Admitting: Nurse Practitioner

## 2022-12-31 ENCOUNTER — Ambulatory Visit: Payer: Self-pay

## 2022-12-31 ENCOUNTER — Ambulatory Visit: Payer: Medicare PPO | Admitting: Nurse Practitioner

## 2022-12-31 ENCOUNTER — Other Ambulatory Visit: Payer: Self-pay

## 2022-12-31 VITALS — BP 122/76 | HR 95 | Temp 97.9°F | Resp 16 | Ht 61.0 in | Wt 178.2 lb

## 2022-12-31 DIAGNOSIS — R600 Localized edema: Secondary | ICD-10-CM | POA: Diagnosis not present

## 2022-12-31 DIAGNOSIS — D649 Anemia, unspecified: Secondary | ICD-10-CM | POA: Diagnosis not present

## 2022-12-31 MED ORDER — FUROSEMIDE 20 MG PO TABS
20.0000 mg | ORAL_TABLET | Freq: Every day | ORAL | 0 refills | Status: DC
Start: 2022-12-31 — End: 2023-01-18

## 2022-12-31 NOTE — Telephone Encounter (Signed)
Pt has an appt today with Julie 

## 2022-12-31 NOTE — Progress Notes (Signed)
BP 122/76   Pulse 95   Temp 97.9 F (36.6 C) (Oral)   Resp 16   Ht 5\' 1"  (1.549 m)   Wt 178 lb 3.2 oz (80.8 kg)   SpO2 97%   BMI 33.67 kg/m    Subjective:    Patient ID: Vickie Stewart, female    DOB: 1946/01/26, 77 y.o.   MRN: [id number]  HPI: Vickie Stewart is a 77 y.o. female  Chief Complaint  Patient presents with   Edema    Feet and ankles swelling    Bilateral lower extremity edema: patient reports swelling started back in June, but has continued to get worse.  She reports elevation does help a little bit.  She denies any chest pain or shortness of breath. She reports pain on the bottom of her foot but not in the calf. She did recently have ultrasound of bilateral legs and showed no DVT.  She says that the swelling can make it difficult to walk around.  She is using a walker.  Blood pressure today is 122/76.  Will get labs, start low dose lasix.  Follow up with Dr. Carlynn Purl on 8/19.  Also discussed elevating legs as much as possible and using compression socks.  Patient verbalized understanding.   Recently had BILATERAL LOWER EXTREMITY VENOUS DOPPLER ULTRASOUND, results below.    TECHNIQUE: Gray-scale sonography with graded compression, as well as color Doppler and duplex ultrasound were performed to evaluate the lower extremity deep venous systems from the level of the common femoral vein and including the common femoral, femoral, profunda femoral, popliteal and calf veins including the posterior tibial, peroneal and gastrocnemius veins when visible. The superficial great saphenous vein was also interrogated. Spectral Doppler was utilized to evaluate flow at rest and with distal augmentation maneuvers in the common femoral, femoral and popliteal veins.   COMPARISON:  None Available.   FINDINGS: RIGHT LOWER EXTREMITY   Common Femoral Vein: No evidence of thrombus. Normal compressibility, respiratory phasicity and response to augmentation.    Saphenofemoral Junction: No evidence of thrombus. Normal compressibility and flow on color Doppler imaging.   Profunda Femoral Vein: No evidence of thrombus. Normal compressibility and flow on color Doppler imaging.   Femoral Vein: No evidence of thrombus. Normal compressibility, respiratory phasicity and response to augmentation.   Popliteal Vein: No evidence of thrombus. Normal compressibility, respiratory phasicity and response to augmentation.   Calf Veins: No evidence of thrombus. Normal compressibility and flow on color Doppler imaging.   Superficial Great Saphenous Vein: No evidence of thrombus. Normal compressibility.   Venous Reflux:  None.   Other Findings: In the popliteal fossa there is a 1.5 x 0.6 x 1.9 cm heterogeneously hypoechoic lesion with increased through transmission, ovoid in shape, without internal blood flow, presumably a Baker's cyst.   LEFT LOWER EXTREMITY   Common Femoral Vein: No evidence of thrombus. Normal compressibility, respiratory phasicity and response to augmentation.   Saphenofemoral Junction: No evidence of thrombus. Normal compressibility and flow on color Doppler imaging.   Profunda Femoral Vein: No evidence of thrombus. Normal compressibility and flow on color Doppler imaging.   Femoral Vein: No evidence of thrombus. Normal compressibility, respiratory phasicity and response to augmentation.   Popliteal Vein: No evidence of thrombus. Normal compressibility, respiratory phasicity and response to augmentation.   Calf Veins: No evidence of thrombus. Normal compressibility and flow on color Doppler imaging.   Superficial Great Saphenous Vein: No evidence of thrombus. Normal compressibility.   Venous Reflux:  None.   Other Findings:  None.   IMPRESSION: 1. No evidence of deep venous thrombosis in either lower extremity. 2. Small right-sided Baker's cyst incidentally noted, as above.  Relevant past medical, surgical, family  and social history reviewed and updated as indicated. Interim medical history since our last visit reviewed. Allergies and medications reviewed and updated.  Review of Systems  Constitutional: Negative for fever or weight change.  Respiratory: Negative for cough and shortness of breath.   Cardiovascular: Negative for chest pain or palpitations. Lower extremity edema Gastrointestinal: Negative for abdominal pain, no bowel changes.  Musculoskeletal: Negative for gait problem or joint swelling.  Skin: Negative for rash.  Neurological: Negative for dizziness or headache.  No other specific complaints in a complete review of systems (except as listed in HPI above).      Objective:    BP 122/76   Pulse 95   Temp 97.9 F (36.6 C) (Oral)   Resp 16   Ht 5\' 1"  (1.549 m)   Wt 178 lb 3.2 oz (80.8 kg)   SpO2 97%   BMI 33.67 kg/m   Wt Readings from Last 3 Encounters:  12/31/22 178 lb 3.2 oz (80.8 kg)  12/08/22 182 lb (82.6 kg)  12/04/22 173 lb (78.5 kg)    Physical Exam  Constitutional: Patient appears well-developed and well-nourished. Obese  No distress.  HEENT: head atraumatic, normocephalic, pupils equal and reactive to light, neck supple Cardiovascular: Normal rate, regular rhythm and normal heart sounds.  No murmur heard. 2+ BLE edema. Pulmonary/Chest: Effort normal and breath sounds normal. No respiratory distress. Abdominal: Soft.  There is no tenderness. Psychiatric: Patient has a normal mood and affect. behavior is normal. Judgment and thought content normal.      Assessment & Plan:   Problem List Items Addressed This Visit   None Visit Diagnoses     Bilateral lower extremity edema    -  Primary   getting labs, start furosemide 20 mg,  elevate legs, use compression socks, follow up with Dr. Carlynn Purl   Relevant Medications   furosemide (LASIX) 20 MG tablet   Other Relevant Orders   COMPLETE METABOLIC PANEL WITH GFR   CBC with Differential/Platelet   B Nat Peptide         Follow up plan: Return for has follow up already scheduled.

## 2022-12-31 NOTE — Telephone Encounter (Signed)
  Chief Complaint: Feet and ankle swelling Symptoms: above Frequency: ongoing , but worse today Pertinent Negatives: Patient denies Chest pain, sob Disposition: [] ED /[] Urgent Care (no appt availability in office) / [x] Appointment(In office/virtual)/ []  [facility]/ [] Home Care/ [] Refused Recommended Disposition /[] [place] [facility]/ []  Follow-up with PCP Additional Notes: Pt reports foot and ankle swelling. Legs may be slightly swollen up to her knees.  No other s/s. Made appt for today, pt is unsure she will be able to come to appt d/t transportation. Pt will call back if unable to come to appt.    Summary: legs and feet are swelling   Pt stated her legs and feet are swelling does not know what to do.  Pt is scheduled to see Dr. Carlynn Purl on 08/19; however, pt would like to see her earlier if possible. No appointments available earlier with Dr.Sowles.  Seeking clinical advice.     Reason for Disposition  SEVERE leg swelling (e.g., swelling extends above knee, entire leg is swollen, weeping fluid)  Answer Assessment - Initial Assessment Questions 1. ONSET: "When did the swelling start?" (e.g., minutes, hours, days)     A long time 2. LOCATION: "What part of the leg is swollen?"  "Are both legs swollen or just one leg?"     Both legs - up to knee 3. SEVERITY: "How bad is the swelling?" (e.g., localized; mild, moderate, severe)   - Localized: Small area of swelling localized to one leg.   - MILD pedal edema: Swelling limited to foot and ankle, pitting edema < 1/4 inch (6 mm) deep, rest and elevation eliminate most or all swelling.   - MODERATE edema: Swelling of lower leg to knee, pitting edema > 1/4 inch (6 mm) deep, rest and elevation only partially reduce swelling.   - SEVERE edema: Swelling extends above knee, facial or hand swelling present.      moderate 4. REDNESS: "Does the swelling look red or infected?"     no 5. PAIN: "Is the swelling painful to touch?" If  Yes, ask: "How painful is it?"   (Scale 1-10; mild, moderate or severe)     tender 6. FEVER: "Do you have a fever?" If Yes, ask: "What is it, how was it measured, and when did it start?"      no 7. CAUSE: "What do you think is causing the leg swelling?"     Retaining water 8. MEDICAL HISTORY: "Do you have a history of blood clots (e.g., DVT), cancer, heart failure, kidney disease, or liver failure?"     no 9. RECURRENT SYMPTOM: "Have you had leg swelling before?" If Yes, ask: "When was the last time?" "What happened that time?"     ongoing 10. OTHER SYMPTOMS: "Do you have any other symptoms?" (e.g., chest pain, difficulty breathing)       no  Protocols used: Leg Swelling and Edema-A-AH

## 2023-01-01 ENCOUNTER — Telehealth: Payer: Self-pay | Admitting: Family Medicine

## 2023-01-01 ENCOUNTER — Other Ambulatory Visit: Payer: Self-pay | Admitting: Nurse Practitioner

## 2023-01-01 ENCOUNTER — Ambulatory Visit: Payer: Medicare PPO | Admitting: Oncology

## 2023-01-01 DIAGNOSIS — J9601 Acute respiratory failure with hypoxia: Secondary | ICD-10-CM | POA: Diagnosis not present

## 2023-01-01 DIAGNOSIS — E7800 Pure hypercholesterolemia, unspecified: Secondary | ICD-10-CM | POA: Diagnosis not present

## 2023-01-01 DIAGNOSIS — F411 Generalized anxiety disorder: Secondary | ICD-10-CM | POA: Diagnosis not present

## 2023-01-01 DIAGNOSIS — D696 Thrombocytopenia, unspecified: Secondary | ICD-10-CM | POA: Diagnosis not present

## 2023-01-01 DIAGNOSIS — I700 Atherosclerosis of aorta: Secondary | ICD-10-CM | POA: Diagnosis not present

## 2023-01-01 DIAGNOSIS — D649 Anemia, unspecified: Secondary | ICD-10-CM

## 2023-01-01 DIAGNOSIS — I119 Hypertensive heart disease without heart failure: Secondary | ICD-10-CM | POA: Diagnosis not present

## 2023-01-01 DIAGNOSIS — E440 Moderate protein-calorie malnutrition: Secondary | ICD-10-CM | POA: Diagnosis not present

## 2023-01-01 DIAGNOSIS — D329 Benign neoplasm of meninges, unspecified: Secondary | ICD-10-CM | POA: Diagnosis not present

## 2023-01-01 DIAGNOSIS — D630 Anemia in neoplastic disease: Secondary | ICD-10-CM | POA: Diagnosis not present

## 2023-01-01 NOTE — Telephone Encounter (Signed)
Home Health Verbal Orders - Caller/Agency: [facility] Callback Number: [phone] Requesting PT Frequency: 2x4 1x5

## 2023-01-04 ENCOUNTER — Ambulatory Visit: Payer: Medicare PPO

## 2023-01-04 ENCOUNTER — Telehealth: Payer: Self-pay | Admitting: Family Medicine

## 2023-01-04 ENCOUNTER — Ambulatory Visit: Admission: RE | Admit: 2023-01-04 | Payer: Medicare PPO | Source: Ambulatory Visit

## 2023-01-04 DIAGNOSIS — J9601 Acute respiratory failure with hypoxia: Secondary | ICD-10-CM | POA: Diagnosis not present

## 2023-01-04 DIAGNOSIS — E7800 Pure hypercholesterolemia, unspecified: Secondary | ICD-10-CM | POA: Diagnosis not present

## 2023-01-04 DIAGNOSIS — F411 Generalized anxiety disorder: Secondary | ICD-10-CM | POA: Diagnosis not present

## 2023-01-04 DIAGNOSIS — D696 Thrombocytopenia, unspecified: Secondary | ICD-10-CM | POA: Diagnosis not present

## 2023-01-04 DIAGNOSIS — E440 Moderate protein-calorie malnutrition: Secondary | ICD-10-CM | POA: Diagnosis not present

## 2023-01-04 DIAGNOSIS — D630 Anemia in neoplastic disease: Secondary | ICD-10-CM | POA: Diagnosis not present

## 2023-01-04 DIAGNOSIS — D329 Benign neoplasm of meninges, unspecified: Secondary | ICD-10-CM | POA: Diagnosis not present

## 2023-01-04 DIAGNOSIS — I119 Hypertensive heart disease without heart failure: Secondary | ICD-10-CM | POA: Diagnosis not present

## 2023-01-04 DIAGNOSIS — I700 Atherosclerosis of aorta: Secondary | ICD-10-CM | POA: Diagnosis not present

## 2023-01-04 NOTE — Telephone Encounter (Signed)
Pt was discharged last month from the hospital. She already has an appointment scheduled later this month with [facility]

## 2023-01-04 NOTE — Telephone Encounter (Signed)
Ester with [facility] called to let Dr. Carlynn Purl know patient decline the OT eval.

## 2023-01-04 NOTE — Telephone Encounter (Signed)
Verbal order given  

## 2023-01-06 ENCOUNTER — Telehealth: Payer: Self-pay

## 2023-01-06 ENCOUNTER — Ambulatory Visit: Payer: Medicare PPO | Admitting: Family Medicine

## 2023-01-06 ENCOUNTER — Other Ambulatory Visit: Payer: Self-pay | Admitting: Nurse Practitioner

## 2023-01-06 NOTE — Telephone Encounter (Signed)
Patient stated that was suppose to get a callback from the office. Patient wanted to know if she needed to start taking an iron supplement. Advised patient I would forward the message to the provider for additional recommendations.  Riesa Pope, [place] 01/04/2023  9:39 AM EDT Back to Top    Patient notified. Feeling better, swelling is still. Medication made her head sore so she has not taken it but one time. CT appointment today   Berniece Salines, FNP 01/01/2023  2:03 PM EDT     Your anemia is slightly worse than previous, I added on an iron panel.  Your BNP which checks for heart failure was normal.  How are you feeling?

## 2023-01-07 DIAGNOSIS — F411 Generalized anxiety disorder: Secondary | ICD-10-CM | POA: Diagnosis not present

## 2023-01-07 DIAGNOSIS — D696 Thrombocytopenia, unspecified: Secondary | ICD-10-CM | POA: Diagnosis not present

## 2023-01-07 DIAGNOSIS — D329 Benign neoplasm of meninges, unspecified: Secondary | ICD-10-CM | POA: Diagnosis not present

## 2023-01-07 DIAGNOSIS — I700 Atherosclerosis of aorta: Secondary | ICD-10-CM | POA: Diagnosis not present

## 2023-01-07 DIAGNOSIS — I119 Hypertensive heart disease without heart failure: Secondary | ICD-10-CM | POA: Diagnosis not present

## 2023-01-07 DIAGNOSIS — E440 Moderate protein-calorie malnutrition: Secondary | ICD-10-CM | POA: Diagnosis not present

## 2023-01-07 DIAGNOSIS — D630 Anemia in neoplastic disease: Secondary | ICD-10-CM | POA: Diagnosis not present

## 2023-01-07 DIAGNOSIS — J9601 Acute respiratory failure with hypoxia: Secondary | ICD-10-CM | POA: Diagnosis not present

## 2023-01-07 DIAGNOSIS — E7800 Pure hypercholesterolemia, unspecified: Secondary | ICD-10-CM | POA: Diagnosis not present

## 2023-01-11 ENCOUNTER — Ambulatory Visit
Admission: RE | Admit: 2023-01-11 | Discharge: 2023-01-11 | Disposition: A | Discharge status: Home / Self Care | Payer: Medicare PPO | Source: Ambulatory Visit | Attending: Oncology | Admitting: Oncology

## 2023-01-11 DIAGNOSIS — C52 Malignant neoplasm of vagina: Secondary | ICD-10-CM | POA: Diagnosis not present

## 2023-01-11 DIAGNOSIS — R918 Other nonspecific abnormal finding of lung field: Secondary | ICD-10-CM | POA: Diagnosis not present

## 2023-01-11 DIAGNOSIS — D329 Benign neoplasm of meninges, unspecified: Secondary | ICD-10-CM | POA: Insufficient documentation

## 2023-01-11 DIAGNOSIS — R937 Abnormal findings on diagnostic imaging of other parts of musculoskeletal system: Secondary | ICD-10-CM | POA: Diagnosis not present

## 2023-01-11 DIAGNOSIS — Z8544 Personal history of malignant neoplasm of other female genital organs: Secondary | ICD-10-CM | POA: Diagnosis not present

## 2023-01-11 LAB — GLUCOSE, CAPILLARY: Glucose-Capillary: 77 mg/dL (ref 70–99)

## 2023-01-11 MED ORDER — FLUDEOXYGLUCOSE F - 18 (FDG) INJECTION
9.2000 | Freq: Once | INTRAVENOUS | Status: AC | PRN
  Administered 2023-01-11: 9.56 via INTRAVENOUS

## 2023-01-12 ENCOUNTER — Ambulatory Visit: Payer: Medicare PPO | Admitting: Oncology

## 2023-01-12 DIAGNOSIS — E440 Moderate protein-calorie malnutrition: Secondary | ICD-10-CM | POA: Diagnosis not present

## 2023-01-12 DIAGNOSIS — J9601 Acute respiratory failure with hypoxia: Secondary | ICD-10-CM | POA: Diagnosis not present

## 2023-01-12 DIAGNOSIS — D630 Anemia in neoplastic disease: Secondary | ICD-10-CM | POA: Diagnosis not present

## 2023-01-12 DIAGNOSIS — E7800 Pure hypercholesterolemia, unspecified: Secondary | ICD-10-CM | POA: Diagnosis not present

## 2023-01-12 DIAGNOSIS — D696 Thrombocytopenia, unspecified: Secondary | ICD-10-CM | POA: Diagnosis not present

## 2023-01-12 DIAGNOSIS — I700 Atherosclerosis of aorta: Secondary | ICD-10-CM | POA: Diagnosis not present

## 2023-01-12 DIAGNOSIS — F411 Generalized anxiety disorder: Secondary | ICD-10-CM | POA: Diagnosis not present

## 2023-01-12 DIAGNOSIS — I119 Hypertensive heart disease without heart failure: Secondary | ICD-10-CM | POA: Diagnosis not present

## 2023-01-12 DIAGNOSIS — D329 Benign neoplasm of meninges, unspecified: Secondary | ICD-10-CM | POA: Diagnosis not present

## 2023-01-14 DIAGNOSIS — E7800 Pure hypercholesterolemia, unspecified: Secondary | ICD-10-CM | POA: Diagnosis not present

## 2023-01-14 DIAGNOSIS — D696 Thrombocytopenia, unspecified: Secondary | ICD-10-CM | POA: Diagnosis not present

## 2023-01-14 DIAGNOSIS — E440 Moderate protein-calorie malnutrition: Secondary | ICD-10-CM | POA: Diagnosis not present

## 2023-01-14 DIAGNOSIS — I119 Hypertensive heart disease without heart failure: Secondary | ICD-10-CM | POA: Diagnosis not present

## 2023-01-14 DIAGNOSIS — D329 Benign neoplasm of meninges, unspecified: Secondary | ICD-10-CM | POA: Diagnosis not present

## 2023-01-14 DIAGNOSIS — F411 Generalized anxiety disorder: Secondary | ICD-10-CM | POA: Diagnosis not present

## 2023-01-14 DIAGNOSIS — I700 Atherosclerosis of aorta: Secondary | ICD-10-CM | POA: Diagnosis not present

## 2023-01-14 DIAGNOSIS — J9601 Acute respiratory failure with hypoxia: Secondary | ICD-10-CM | POA: Diagnosis not present

## 2023-01-14 DIAGNOSIS — D630 Anemia in neoplastic disease: Secondary | ICD-10-CM | POA: Diagnosis not present

## 2023-01-15 NOTE — Progress Notes (Unsigned)
Name: Vickie Stewart   MRN: [id number]    DOB: November 01, 1945   Date:01/18/2023       Progress Note  Subjective  Chief Complaint  Discharge Follow-Up  HPI  Malnutrition: albumin is down to 2.7 , she states since hospital stay she states food does not taste right and makes her gag. I will check zinc, B1 and B12 today. 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   Lesion bone: had PET scan done and looks reassuring, she is going to see oncologist tomorrow.   Lower extremity edema: likely from malnutrition and anemia, discussed increase protein intake but we will also decrease cardizem to see if it will improve symptoms   HTN: at discharge she was advised to hold bp medication due to hypotension but she states she has been taking cardizem CD 360 mg and olmasartan 20 mg daily .  Patient Active Problem List   Diagnosis Date Noted   Inability to ambulate due to multiple joints 12/10/2022   PNA (pneumonia) 12/09/2022   Acute respiratory failure with hypoxia (HCC) 12/09/2022   Bilateral leg weakness 12/09/2022   Lower extremity weakness 12/04/2022   Thrombocytopenia (HCC) 12/04/2022   Pre-diabetes 12/04/2022   ASCUS of cervix with negative high risk HPV 01/07/2022   Occipital neuralgia of left side 01/07/2022   Balance problems 01/07/2022   Vertigo 01/07/2022   Pulsatile tinnitus of right ear 06/18/2020   Pulsatile neck mass 06/18/2020   OSA (obstructive sleep apnea) 09/19/2019   Anxiety and depression 09/19/2019   Other neutropenia (HCC) 08/11/2019   Left thyroid nodule 08/08/2019   Chronic neck and back pain 01/03/2019   Meningioma (HCC) 10/04/2017   Lichen sclerosus 03/24/2016   Vaginal atrophy 03/18/2016   Encounter for follow-up surveillance of vaginal cancer 03/18/2016    Eczema 10/31/2015   Left hand pain 09/10/2015   Osteopenia after menopause 07/26/2015   Menopause 07/26/2015   Irritable colon 07/26/2015   History of cancer of vagina 07/26/2015   Diffuse cystic mastopathy 07/26/2015   Obesity (BMI 30.0-34.9) 07/26/2015   Intermittent low back pain 07/26/2015   Hyperlipemia 07/26/2015   Osteoarthritis 05/13/2015   Allergic rhinitis 05/13/2015   Gastroesophageal reflux disease without esophagitis 05/13/2015   Essential hypertension, benign     Past Surgical History:  Procedure Laterality Date   BREAST EXCISIONAL BIOPSY Left    neg   BREAST LUMPECTOMY Left    COLONOSCOPY  08/26/2006   COLONOSCOPY WITH PROPOFOL N/A 10/21/2016   Procedure: COLONOSCOPY WITH PROPOFOL;  Surgeon: Kieth Brightly, MD;  Location: [facility] ENDOSCOPY;  Service: Endoscopy;  Laterality: N/A;   ESOPHAGOGASTRODUODENOSCOPY (EGD) WITH PROPOFOL N/A 02/07/2020   Procedure: ESOPHAGOGASTRODUODENOSCOPY (EGD) WITH PROPOFOL;  Surgeon: Toney Reil, MD;  Location: [facility] ENDOSCOPY;  Service: Gastroenterology;  Laterality: N/A;    Family History  Problem Relation Age of Onset   Pulmonary embolism Mother    Hypertension Father    Aortic stenosis Father    Breast cancer Neg Hx     Social History   Tobacco Use   Smoking status: Never   Smokeless tobacco: Never   Tobacco comments:    experimented with dip snuff as a child  Substance Use Topics   Alcohol use: No     Current Outpatient Medications:    aspirin 81 MG tablet, Take  81 mg by mouth daily. Reported on 10/10/2015, Disp: , Rfl:    cholecalciferol (VITAMIN D) 1000 units tablet, Take 1,000 Units by mouth daily., Disp: , Rfl:    furosemide (LASIX) 20 MG tablet, Take 1 tablet (20 mg total) by mouth daily., Disp: 30 tablet, Rfl: 0   latanoprost (XALATAN) 0.005 % ophthalmic solution, PLACE 1 DROP IN BOTH EYES AT BEDTIME, Disp: , Rfl: 4   magnesium oxide (MAG-OX) 400 (240 Mg) MG tablet, Take 400 mg by mouth daily., Disp:  , Rfl:    mirtazapine (REMERON) 15 MG tablet, Take 1 tablet (15 mg total) by mouth at bedtime., Disp: , Rfl:    Multiple Vitamin (MULTIVITAMIN WITH MINERALS) TABS tablet, Take 1 tablet by mouth daily., Disp: 30 tablet, Rfl: 0   Omega-3 Fatty Acids (FISH OIL CONCENTRATE PO), Take by mouth. Reported on 10/10/2015, Disp: , Rfl:    omeprazole (PRILOSEC) 20 MG capsule, Take 1 capsule (20 mg total) by mouth 2 (two) times daily before a meal., Disp: , Rfl:    rosuvastatin (CRESTOR) 10 MG tablet, Take 1 tablet (10 mg total) by mouth daily., Disp: 90 tablet, Rfl: 3   sucralfate (CARAFATE) 1 g tablet, Take 1 tablet (1 g total) by mouth in the morning, at noon, and at bedtime., Disp: 90 tablet, Rfl: 2  Allergies  Allergen Reactions   Atorvastatin     difficulty concentrating and focusing   Prednisone Swelling   Terbinafine And Related Dermatitis    Rash, itch, skin discoloration   Zetia [Ezetimibe] Other (See Comments)    Muscle/joint pain    I personally reviewed active problem list, medication list, allergies, family history, social history, health maintenance with the patient/caregiver today.   ROS  Ten systems reviewed and is negative except as mentioned in HPI    Objective  Vitals:   01/18/23 1335  BP: 120/72  Pulse: 74  Resp: 16  SpO2: 96%  Weight: 178 lb (80.7 kg)  Height: 5\' 1"  (1.549 m)    Body mass index is 33.63 kg/m.  Physical Exam  Constitutional: Patient appears well-developed No distress.  HEENT: head atraumatic, normocephalic, pupils equal and reactive to light, neck supple Cardiovascular: Normal rate, regular rhythm and normal heart sounds.  No murmur heard. Bilateral  BLE edema mostly non pitting but trace of pitting . Pulmonary/Chest: Effort normal and breath sounds normal. No respiratory distress. Abdominal: Soft.  There is no tenderness. Psychiatric: Patient has a normal mood and affect. behavior is normal. Judgment and thought content normal.     PHQ2/9:    01/18/2023    1:35 PM 12/31/2022   12:53 PM 09/28/2022   10:54 AM 07/10/2022   10:41 AM 01/07/2022   10:34 AM  Depression screen PHQ 2/9  Decreased Interest 0 0 0 0 0  Down, Depressed, Hopeless 0 0 0 0 0  PHQ - 2 Score 0 0 0 0 0  Altered sleeping   0 0   Tired, decreased energy   0 0   Change in appetite   0 0   Feeling bad or failure about yourself    0 0   Trouble concentrating   0 0   Moving slowly or fidgety/restless   0 0   Suicidal thoughts   0 0   PHQ-9 Score   0 0     phq 9 is negative   Fall Risk:    01/18/2023    1:34 PM 12/31/2022   12:52 PM 09/28/2022  10:53 AM 07/10/2022   10:40 AM 01/07/2022   10:34 AM  Fall Risk   Falls in the past year? 1 1 0 0 0  Number falls in past yr: 1 1 0  0  Injury with Fall? 1 1 0  0  Risk for fall due to : Impaired balance/gait History of fall(s);Impaired balance/gait;Impaired mobility No Fall Risks No Fall Risks No Fall Risks  Follow up Falls prevention discussed  Falls prevention discussed Falls prevention discussed;Education provided;Falls evaluation completed Falls prevention discussed      Functional Status Survey: Is the patient deaf or have difficulty hearing?: No Does the patient have difficulty seeing, even when wearing glasses/contacts?: No Does the patient have difficulty concentrating, remembering, or making decisions?: No Does the patient have difficulty walking or climbing stairs?: Yes Does the patient have difficulty dressing or bathing?: No Does the patient have difficulty doing errands alone such as visiting a doctor's office or shopping?: Yes    Assessment & Plan  1. Moderate protein-calorie malnutrition (HCC)  - Vitamin B1 - Zinc - B12 and Folate Panel  2. Anemia, unspecified type  She will see Dr. Orlie Dakin tomorrow  3. Lesion of bone of thoracic spine  PET scan reviewed   4. Essential hypertension, benign  She resumed taking BP medication when she went home from [facility], bp is at goal  but she has lower extremity edema, we will go down on cardizem from 360 mg to 180 and increase benicar 40 mg and recheck bp in 2-3 weeks   - olmesartan (BENICAR) 20 MG tablet; Take 2 tablets (40 mg total) by mouth daily.  Dispense: 30 tablet; Refill: 0 - diltiazem (CARDIZEM CD) 180 MG 24 hr capsule; Take 1 capsule (180 mg total) by mouth daily.  Dispense: 30 capsule; Refill: 0  5. Pure hypercholesterolemia  - rosuvastatin (CRESTOR) 10 MG tablet; Take 1 tablet (10 mg total) by mouth daily.  Dispense: 90 tablet; Refill: 3

## 2023-01-18 ENCOUNTER — Encounter: Payer: Self-pay | Admitting: Family Medicine

## 2023-01-18 ENCOUNTER — Other Ambulatory Visit: Payer: Self-pay | Admitting: Family Medicine

## 2023-01-18 ENCOUNTER — Ambulatory Visit: Payer: Medicare PPO | Admitting: Family Medicine

## 2023-01-18 VITALS — BP 120/72 | HR 74 | Resp 16 | Ht 61.0 in | Wt 178.0 lb

## 2023-01-18 DIAGNOSIS — M899 Disorder of bone, unspecified: Secondary | ICD-10-CM

## 2023-01-18 DIAGNOSIS — E7800 Pure hypercholesterolemia, unspecified: Secondary | ICD-10-CM | POA: Diagnosis not present

## 2023-01-18 DIAGNOSIS — D649 Anemia, unspecified: Secondary | ICD-10-CM | POA: Diagnosis not present

## 2023-01-18 DIAGNOSIS — I10 Essential (primary) hypertension: Secondary | ICD-10-CM | POA: Diagnosis not present

## 2023-01-18 DIAGNOSIS — E440 Moderate protein-calorie malnutrition: Secondary | ICD-10-CM

## 2023-01-18 MED ORDER — OLMESARTAN MEDOXOMIL 40 MG PO TABS: 40.0000 mg | ORAL_TABLET | Freq: Every day | ORAL | 0 refills | Status: DC

## 2023-01-18 MED ORDER — DILTIAZEM HCL ER COATED BEADS 180 MG PO CP24
180.0000 mg | ORAL_CAPSULE | Freq: Every day | ORAL | 0 refills | Status: DC
Start: 2023-01-18 — End: 2023-02-10

## 2023-01-18 MED ORDER — ROSUVASTATIN CALCIUM 10 MG PO TABS
10.0000 mg | ORAL_TABLET | Freq: Every day | ORAL | 3 refills | Status: DC
Start: 2023-01-18 — End: 2024-03-29

## 2023-01-18 MED ORDER — OLMESARTAN MEDOXOMIL 20 MG PO TABS: 40.0000 mg | ORAL_TABLET | Freq: Every day | ORAL | 0 refills | Status: DC

## 2023-01-18 NOTE — Patient Instructions (Signed)
High-Protein and High-Calorie Diet Eating high-protein and high-calorie foods can help you to gain weight, heal after an injury, and recover after an illness or surgery. The specific amount of daily protein and calories you need depends on: Your body weight. The reason this diet is recommended for you. Generally, a high-protein, high-calorie diet involves: Eating 250-500 extra calories each day. Making sure that you get enough of your daily calories from protein. Ask your health care provider how many of your calories should come from protein. Talk with a health care provider or a dietitian about how much protein and how many calories you need each day. Follow the diet as directed by your health care provider. What are tips for following this plan?  Reading food labels Check the nutrition facts label for calories, grams of fat and protein. Items with more than 4 grams of protein are high-protein foods. Preparing meals Add whole milk, half-and-half, or heavy cream to cereal, pudding, soup, or hot cocoa. Add whole milk to instant breakfast drinks. Add peanut butter to oatmeal or smoothies. Add powdered milk to baked goods, smoothies, or milkshakes. Add powdered milk, cream, or butter to mashed potatoes. Add cheese to cooked vegetables. Make whole-milk yogurt parfaits. Top them with granola, fruit, or nuts. Add cottage cheese to fruit. Add avocado, cheese, or both to sandwiches or salads. Add avocado to smoothies. Add meat, poultry, or seafood to rice, pasta, casseroles, salads, and soups. Use mayonnaise when making egg salad, chicken salad, or tuna salad. Use peanut butter as a dip for fruits and vegetables or as a topping for pretzels, celery, or crackers. Add beans to casseroles, dips, and spreads. Add pureed beans to sauces and soups. Replace calorie-free drinks with calorie-containing drinks, such as milk and fruit juice. Replace water with milk or heavy cream when making foods such as  oatmeal, pudding, or cocoa. Add oil or butter to cooked vegetables and grains. Add cream cheese to sandwiches or as a topping on crackers and bread. Make cream-based pastas and soups. General information Ask your health care provider if you should take a nutritional supplement. Try to eat six small meals each day instead of three large meals. A general goal is to eat every 2 to 3 hours. Eat a balanced diet. In each meal, include one food that is high in protein and one food with fat in it. Keep nutritious snacks available, such as nuts, trail mixes, dried fruit, and yogurt. If you have kidney disease or diabetes, talk with your health care provider about how much protein is safe for you. Too much protein may put extra stress on your kidneys. Drink your calories. Choose high-calorie drinks and have them after your meals. Consider setting a timer to remind you to eat. You will want to eat even if you do not feel very hungry. What high-protein foods should I eat?  Vegetables Soybeans. Peas. Grains Quinoa. Bulgur wheat. Buckwheat. Meats and other proteins Beef, pork, and poultry. Fish and seafood. Eggs. Tofu. Textured vegetable protein (TVP). Peanut butter. Nuts and seeds. Dried beans. Protein powders. Hummus. Dairy Whole milk. Whole-milk yogurt. Powdered milk. Cheese. Cottage Cheese. Eggnog. Beverages High-protein supplement drinks. Soy milk. Other foods Protein bars. The items listed above may not be a complete list of foods and beverages you can eat and drink. Contact a dietitian for more information. What high-calorie foods should I eat? Fruits Dried fruit. Fruit leather. Canned fruit in syrup. Fruit juice. Avocado. Vegetables Vegetables cooked in oil or butter. Fried potatoes. Grains   Pasta. Quick breads. Muffins. Pancakes. Ready-to-eat cereal. Meats and other proteins Peanut butter. Nuts and seeds. Dairy Heavy cream. Whipped cream. Cream cheese. Sour cream. Ice cream. Custard.  Pudding. Whole milk dairy products. Beverages Meal-replacement beverages. Nutrition shakes. Fruit juice. Seasonings and condiments Salad dressing. Mayonnaise. Alfredo sauce. Fruit preserves or jelly. Honey. Syrup. Sweets and desserts Cake. Cookies. Pie. Pastries. Candy bars. Chocolate. Fats and oils Butter or margarine. Oil. Gravy. Other foods Meal-replacement bars. The items listed above may not be a complete list of foods and beverages you can eat and drink. Contact a dietitian for more information. Summary A high-protein, high-calorie diet can help you gain weight or heal faster after an injury, illness, or surgery. To increase your protein and calories, add ingredients such as whole milk, peanut butter, cheese, beans, meat, or seafood to meal items. To get enough extra calories each day, include high-calorie foods and beverages at each meal. Adding a high-calorie drink or shake can be an easy way to help you get enough calories each day. Talk with your healthcare provider or dietitian about the best options for you. This information is not intended to replace advice given to you by your health care provider. Make sure you discuss any questions you have with your health care provider. Document Revised: 04/19/2020 Document Reviewed: 04/21/2020 Elsevier Patient Education  2024 Elsevier Inc.  

## 2023-01-19 ENCOUNTER — Encounter: Payer: Self-pay | Admitting: Oncology

## 2023-01-19 ENCOUNTER — Inpatient Hospital Stay: Payer: Medicare PPO | Attending: Oncology | Admitting: Oncology

## 2023-01-19 VITALS — BP 135/64 | HR 64 | Temp 96.7°F | Resp 16 | Ht 61.0 in | Wt 171.0 lb

## 2023-01-19 DIAGNOSIS — R531 Weakness: Secondary | ICD-10-CM | POA: Insufficient documentation

## 2023-01-19 DIAGNOSIS — I3139 Other pericardial effusion (noninflammatory): Secondary | ICD-10-CM | POA: Diagnosis not present

## 2023-01-19 DIAGNOSIS — I700 Atherosclerosis of aorta: Secondary | ICD-10-CM | POA: Diagnosis not present

## 2023-01-19 DIAGNOSIS — D649 Anemia, unspecified: Secondary | ICD-10-CM | POA: Insufficient documentation

## 2023-01-19 DIAGNOSIS — K760 Fatty (change of) liver, not elsewhere classified: Secondary | ICD-10-CM | POA: Diagnosis not present

## 2023-01-19 DIAGNOSIS — Z8249 Family history of ischemic heart disease and other diseases of the circulatory system: Secondary | ICD-10-CM | POA: Insufficient documentation

## 2023-01-19 DIAGNOSIS — R5383 Other fatigue: Secondary | ICD-10-CM | POA: Insufficient documentation

## 2023-01-19 DIAGNOSIS — D321 Benign neoplasm of spinal meninges: Secondary | ICD-10-CM | POA: Insufficient documentation

## 2023-01-19 DIAGNOSIS — Z888 Allergy status to other drugs, medicaments and biological substances status: Secondary | ICD-10-CM | POA: Insufficient documentation

## 2023-01-19 DIAGNOSIS — I10 Essential (primary) hypertension: Secondary | ICD-10-CM | POA: Insufficient documentation

## 2023-01-19 DIAGNOSIS — Z79899 Other long term (current) drug therapy: Secondary | ICD-10-CM | POA: Diagnosis not present

## 2023-01-19 DIAGNOSIS — C52 Malignant neoplasm of vagina: Secondary | ICD-10-CM | POA: Diagnosis not present

## 2023-01-19 NOTE — Progress Notes (Signed)
[facility]  [phone] [phone]  ID: Vickie Stewart OB: 1945-10-16  MR#: [id number]  NUU#:[id number]  Patient Care Team: Alba Cory, MD as PCP - General (Family Medicine) Kieth Brightly, MD (General Surgery) Tedd Sias Marlana Salvage, MD as Physician Assistant (Endocrinology)  CHIEF COMPLAINT: History of meningioma, bilateral lower extremity weakness.  INTERVAL HISTORY: Patient returns to clinic today for hospital follow-up and discussion of her PET scan results.  She continues to have significant bilateral lower extremity weakness, but states this has improved over the past month.  She otherwise feels well.  She has no neurologic complaints.  She denies any recent fevers or illnesses.  She has a good appetite and denies weight loss.  She has no chest pain, shortness of breath, cough, or hemoptysis.  She denies any nausea, vomiting, constipation, or diarrhea.  She has no urinary complaints.  Patient offers no further specific complaints today  REVIEW OF SYSTEMS:   Review of Systems  Constitutional:  Positive for malaise/fatigue. Negative for fever and weight loss.  Respiratory: Negative.  Negative for cough, hemoptysis and shortness of breath.   Cardiovascular: Negative.  Negative for chest pain and leg swelling.  Gastrointestinal: Negative.  Negative for abdominal pain.  Genitourinary: Negative.  Negative for dysuria.  Musculoskeletal: Negative.  Negative for back pain.  Skin: Negative.  Negative for rash.  Neurological:  Positive for focal weakness and weakness. Negative for dizziness, speech change and headaches.  Psychiatric/Behavioral: Negative.  The patient is not nervous/anxious.     As per HPI. Otherwise, a complete review of systems is negative.  PAST MEDICAL HISTORY: Past Medical History:  Diagnosis Date   Allergic rhinitis, cause unspecified    Anxiety state, unspecified    Contact dermatitis and other eczema, due to  unspecified cause    Diffuse cystic mastopathy    Dyspepsia and other specified disorders of function of stomach    Essential hypertension, benign    Glaucoma    Irritable bowel syndrome    Lateral epicondylitis  of elbow    Leukocytopenia, unspecified    Mastodynia    Obesity, unspecified    Pure hypercholesterolemia    Reflux esophagitis    Thoracic or lumbosacral neuritis or radiculitis, unspecified    Unspecified disorder of skin and subcutaneous tissue    Vaginal cancer (HCC)    History    PAST SURGICAL HISTORY: Past Surgical History:  Procedure Laterality Date   BREAST EXCISIONAL BIOPSY Left    neg   BREAST LUMPECTOMY Left    COLONOSCOPY  08/26/2006   COLONOSCOPY WITH PROPOFOL N/A 10/21/2016   Procedure: COLONOSCOPY WITH PROPOFOL;  Surgeon: Kieth Brightly, MD;  Location: [facility] ENDOSCOPY;  Service: Endoscopy;  Laterality: N/A;   ESOPHAGOGASTRODUODENOSCOPY (EGD) WITH PROPOFOL N/A 02/07/2020   Procedure: ESOPHAGOGASTRODUODENOSCOPY (EGD) WITH PROPOFOL;  Surgeon: Toney Reil, MD;  Location[facility] ENDOSCOPY;  Service: Gastroenterology;  Laterality: N/A;    FAMILY HISTORY: Family History  Problem Relation Age of Onset   Pulmonary embolism Mother    Hypertension Father    Aortic stenosis Father    Breast cancer Neg Hx     ADVANCED DIRECTIVES (Y/N):  N  HEALTH MAINTENANCE: Social History   Tobacco Use   Smoking status: Never   Smokeless tobacco: Never   Tobacco comments:    experimented with dip snuff as a child  Vaping Use   Vaping status: Never Used  Substance Use Topics   Alcohol use: No   Drug use: No  Colonoscopy:  PAP:  Bone density:  Lipid panel:  Allergies  Allergen Reactions   Atorvastatin     difficulty concentrating and focusing   Prednisone Swelling   Terbinafine And Related Dermatitis    Rash, itch, skin discoloration   Zetia [Ezetimibe] Other (See Comments)    Muscle/joint pain    Current Outpatient Medications   Medication Sig Dispense Refill   aspirin 81 MG tablet Take 81 mg by mouth daily. Reported on 10/10/2015     cholecalciferol (VITAMIN D) 1000 units tablet Take 1,000 Units by mouth daily.     diltiazem (CARDIZEM CD) 180 MG 24 hr capsule Take 1 capsule (180 mg total) by mouth daily. 30 capsule 0   latanoprost (XALATAN) 0.005 % ophthalmic solution PLACE 1 DROP IN BOTH EYES AT BEDTIME  4   magnesium oxide (MAG-OX) 400 (240 Mg) MG tablet Take 400 mg by mouth daily.     Multiple Vitamin (MULTIVITAMIN WITH MINERALS) TABS tablet Take 1 tablet by mouth daily. 30 tablet 0   olmesartan (BENICAR) 40 MG tablet Take 1 tablet (40 mg total) by mouth daily. 30 tablet 0   Omega-3 Fatty Acids (FISH OIL CONCENTRATE PO) Take by mouth. Reported on 10/10/2015     omeprazole (PRILOSEC) 20 MG capsule Take 1 capsule (20 mg total) by mouth 2 (two) times daily before a meal.     rosuvastatin (CRESTOR) 10 MG tablet Take 1 tablet (10 mg total) by mouth daily. 90 tablet 3   sucralfate (CARAFATE) 1 g tablet Take 1 tablet (1 g total) by mouth in the morning, at noon, and at bedtime. 90 tablet 2   No current facility-administered medications for this visit.    OBJECTIVE: Vitals:   01/19/23 1053  BP: 135/64  Pulse: 64  Resp: 16  Temp: (!) 96.7 F (35.9 C)  SpO2: 100%     Body mass index is 32.31 kg/m.    ECOG FS:2 - Symptomatic, <50% confined to bed  General: Well-developed, well-nourished, no acute distress. Eyes: Pink conjunctiva, anicteric sclera. HEENT: Normocephalic, moist mucous membranes. Lungs: No audible wheezing or coughing. Heart: Regular rate and rhythm. Abdomen: Soft, nontender, no obvious distention. Musculoskeletal: No edema, cyanosis, or clubbing. Neuro: Alert, answering all questions appropriately. Cranial nerves grossly intact. Skin: No rashes or petechiae noted. Psych: Normal affect. Lymphatics: No cervical, calvicular, axillary or inguinal LAD.   LAB RESULTS:  Lab Results  Component  Value Date   NA 141 12/31/2022   K 3.9 12/31/2022   CL 109 12/31/2022   CO2 25 12/31/2022   GLUCOSE 73 12/31/2022   BUN 7 12/31/2022   CREATININE 0.55 (L) 12/31/2022   CALCIUM 8.3 (L) 12/31/2022   PROT 5.8 (L) 12/31/2022   ALBUMIN 2.5 (L) 12/08/2022   AST 24 12/31/2022   ALT 19 12/31/2022   ALKPHOS 58 12/08/2022   BILITOT 0.4 12/31/2022   GFRNONAA >60 12/10/2022   GFRAA 88 08/13/2020    Lab Results  Component Value Date   WBC 5.9 12/31/2022   NEUTROABS 3,865 12/31/2022   HGB 9.8 (L) 12/31/2022   HCT 31.6 (L) 12/31/2022   MCV 93.8 12/31/2022   PLT 364 12/31/2022     STUDIES: NM PET Image Initial (PI) Skull Base To Thigh  Result Date: 01/16/2023 CLINICAL DATA:  Subsequent treatment strategy for vaginal cancer with spinal lesions on MR. EXAM: NUCLEAR MEDICINE PET SKULL BASE TO THIGH TECHNIQUE: 9.6 mCi F-18 FDG was injected intravenously. Full-ring PET imaging was performed from the skull base to  thigh after the radiotracer. CT data was obtained and used for attenuation correction and anatomic localization. Fasting blood glucose: 77 mg/dl COMPARISON:  MR thoracolumbar spine and CT abdomen/pelvis dated 12/09/2022. CTA chest dated 12/08/2022. FINDINGS: Mediastinal blood pool activity: SUV max 2.9 Liver activity: SUV max NA NECK: No hypermetabolic cervical lymphadenopathy. Incidental CT findings: None. CHEST: Prior multifocal pneumonia is improved. 11 mm nodular opacity in the lingula (series 4/image 86), max SUV 3.7. However, this is favored to be new from recent prior CT chest, and therefore likely infectious/inflammatory. No hypermetabolic thoracic lymphadenopathy. Incidental CT findings: Atherosclerotic calcifications of the aortic arch. Trace pericardial effusion. ABDOMEN/PELVIS: No abnormal hypermetabolism in the liver, spleen, pancreas, or adrenal glands. No hypermetabolic abdominopelvic lymphadenopathy. Incidental CT findings: Severe geographic hepatic steatosis. Mild  atherosclerotic calcifications the abdominal aorta and branch vessels. Fiducial markers along the cervix/vagina. SKELETON: No focal hypermetabolic activity to suggest skeletal metastasis. Specifically, no focal hypermetabolism along the right posterior elements of T6 or T10. The suspected L1 lesion is also not apparent. Incidental CT findings: Degenerative changes of the visualized thoracolumbar spine. IMPRESSION: New lingular nodular opacity, favored to be infectious inflammatory. Follow-up CT chest is suggested in 3 months. Otherwise, no findings suspicious for recurrent or metastatic disease. Specifically, no focal hypermetabolism to correspond to the prior MR lesions. Electronically Signed   By: Charline Bills M.D.   On: 01/16/2023 01:38    ASSESSMENT: History of meningioma, bilateral lower extremity weakness.  PLAN:    Meningioma: Previously no biopsy was taken, but patient initiated XRT secondary to increased growth of lesion and headache.  She completed her treatments on November 23, 2022.  MRI results from December 04, 2022 reviewed independently with no significant change in meningioma.  Continue follow-up with radiation oncology and neurosurgery as indicated. Vertebral lesions: MRI of the thoracic and lumbar spine revealed contrast-enhancing lesions at T6 and T10, but these are too small to characterize or biopsy.  Subsequent PET scan on January 16, 2023 reviewed independently and report as above with no significant hypermetabolism.  No further intervention is needed.  No further imaging is necessary.   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  Patient is followed by [facility] neurology.  Anemia: Patient's hemoglobin is decreased, but stable at 9.8.  We discussed further workup and follow-up but patient declined and wishes to continue to be monitored by her primary care.  No follow-up has been scheduled.     Jeralyn Ruths, MD   01/19/2023  1:22 PM

## 2023-01-19 NOTE — Progress Notes (Signed)
Following up on PET results. Finished radiation on 6/24. Having weakness in her legs and can barely walk. States she was told it was between prednisone and radiation.

## 2023-01-20 DIAGNOSIS — D329 Benign neoplasm of meninges, unspecified: Secondary | ICD-10-CM | POA: Diagnosis not present

## 2023-01-20 DIAGNOSIS — D630 Anemia in neoplastic disease: Secondary | ICD-10-CM | POA: Diagnosis not present

## 2023-01-20 DIAGNOSIS — E440 Moderate protein-calorie malnutrition: Secondary | ICD-10-CM | POA: Diagnosis not present

## 2023-01-20 DIAGNOSIS — F411 Generalized anxiety disorder: Secondary | ICD-10-CM | POA: Diagnosis not present

## 2023-01-20 DIAGNOSIS — D696 Thrombocytopenia, unspecified: Secondary | ICD-10-CM | POA: Diagnosis not present

## 2023-01-20 DIAGNOSIS — I119 Hypertensive heart disease without heart failure: Secondary | ICD-10-CM | POA: Diagnosis not present

## 2023-01-20 DIAGNOSIS — I700 Atherosclerosis of aorta: Secondary | ICD-10-CM | POA: Diagnosis not present

## 2023-01-20 DIAGNOSIS — J9601 Acute respiratory failure with hypoxia: Secondary | ICD-10-CM | POA: Diagnosis not present

## 2023-01-20 DIAGNOSIS — E7800 Pure hypercholesterolemia, unspecified: Secondary | ICD-10-CM | POA: Diagnosis not present

## 2023-01-22 DIAGNOSIS — E440 Moderate protein-calorie malnutrition: Secondary | ICD-10-CM | POA: Diagnosis not present

## 2023-01-22 DIAGNOSIS — I119 Hypertensive heart disease without heart failure: Secondary | ICD-10-CM | POA: Diagnosis not present

## 2023-01-22 DIAGNOSIS — E7800 Pure hypercholesterolemia, unspecified: Secondary | ICD-10-CM | POA: Diagnosis not present

## 2023-01-22 DIAGNOSIS — J9601 Acute respiratory failure with hypoxia: Secondary | ICD-10-CM | POA: Diagnosis not present

## 2023-01-22 DIAGNOSIS — I700 Atherosclerosis of aorta: Secondary | ICD-10-CM | POA: Diagnosis not present

## 2023-01-22 DIAGNOSIS — D329 Benign neoplasm of meninges, unspecified: Secondary | ICD-10-CM | POA: Diagnosis not present

## 2023-01-22 DIAGNOSIS — D696 Thrombocytopenia, unspecified: Secondary | ICD-10-CM | POA: Diagnosis not present

## 2023-01-22 DIAGNOSIS — D630 Anemia in neoplastic disease: Secondary | ICD-10-CM | POA: Diagnosis not present

## 2023-01-22 DIAGNOSIS — F411 Generalized anxiety disorder: Secondary | ICD-10-CM | POA: Diagnosis not present

## 2023-01-26 DIAGNOSIS — E7800 Pure hypercholesterolemia, unspecified: Secondary | ICD-10-CM | POA: Diagnosis not present

## 2023-01-26 DIAGNOSIS — F411 Generalized anxiety disorder: Secondary | ICD-10-CM | POA: Diagnosis not present

## 2023-01-26 DIAGNOSIS — I700 Atherosclerosis of aorta: Secondary | ICD-10-CM | POA: Diagnosis not present

## 2023-01-26 DIAGNOSIS — E440 Moderate protein-calorie malnutrition: Secondary | ICD-10-CM | POA: Diagnosis not present

## 2023-01-26 DIAGNOSIS — D630 Anemia in neoplastic disease: Secondary | ICD-10-CM | POA: Diagnosis not present

## 2023-01-26 DIAGNOSIS — D696 Thrombocytopenia, unspecified: Secondary | ICD-10-CM | POA: Diagnosis not present

## 2023-01-26 DIAGNOSIS — J9601 Acute respiratory failure with hypoxia: Secondary | ICD-10-CM | POA: Diagnosis not present

## 2023-01-26 DIAGNOSIS — I119 Hypertensive heart disease without heart failure: Secondary | ICD-10-CM | POA: Diagnosis not present

## 2023-01-26 DIAGNOSIS — D329 Benign neoplasm of meninges, unspecified: Secondary | ICD-10-CM | POA: Diagnosis not present

## 2023-01-26 LAB — VITAMIN B1: Vitamin B1 (Thiamine): <6 nmol/L — ABNORMAL LOW (ref 8–30)

## 2023-01-26 LAB — ZINC: Zinc: 102 ug/dL (ref 60–130)

## 2023-01-26 LAB — B12 AND FOLATE PANEL
Folate: 13.1 ng/mL
Vitamin B-12: 673 pg/mL (ref 200–1100)

## 2023-01-30 ENCOUNTER — Other Ambulatory Visit: Payer: Self-pay | Admitting: Family Medicine

## 2023-01-30 DIAGNOSIS — I10 Essential (primary) hypertension: Secondary | ICD-10-CM

## 2023-02-02 DIAGNOSIS — I119 Hypertensive heart disease without heart failure: Secondary | ICD-10-CM | POA: Diagnosis not present

## 2023-02-02 DIAGNOSIS — D696 Thrombocytopenia, unspecified: Secondary | ICD-10-CM | POA: Diagnosis not present

## 2023-02-02 DIAGNOSIS — J9601 Acute respiratory failure with hypoxia: Secondary | ICD-10-CM | POA: Diagnosis not present

## 2023-02-02 DIAGNOSIS — E7800 Pure hypercholesterolemia, unspecified: Secondary | ICD-10-CM | POA: Diagnosis not present

## 2023-02-02 DIAGNOSIS — F411 Generalized anxiety disorder: Secondary | ICD-10-CM | POA: Diagnosis not present

## 2023-02-02 DIAGNOSIS — E440 Moderate protein-calorie malnutrition: Secondary | ICD-10-CM | POA: Diagnosis not present

## 2023-02-02 DIAGNOSIS — H40153 Residual stage of open-angle glaucoma, bilateral: Secondary | ICD-10-CM | POA: Diagnosis not present

## 2023-02-02 DIAGNOSIS — D329 Benign neoplasm of meninges, unspecified: Secondary | ICD-10-CM | POA: Diagnosis not present

## 2023-02-02 DIAGNOSIS — I700 Atherosclerosis of aorta: Secondary | ICD-10-CM | POA: Diagnosis not present

## 2023-02-02 DIAGNOSIS — D630 Anemia in neoplastic disease: Secondary | ICD-10-CM | POA: Diagnosis not present

## 2023-02-09 DIAGNOSIS — E7800 Pure hypercholesterolemia, unspecified: Secondary | ICD-10-CM | POA: Diagnosis not present

## 2023-02-09 DIAGNOSIS — D630 Anemia in neoplastic disease: Secondary | ICD-10-CM | POA: Diagnosis not present

## 2023-02-09 DIAGNOSIS — I700 Atherosclerosis of aorta: Secondary | ICD-10-CM | POA: Diagnosis not present

## 2023-02-09 DIAGNOSIS — E440 Moderate protein-calorie malnutrition: Secondary | ICD-10-CM | POA: Diagnosis not present

## 2023-02-09 DIAGNOSIS — D329 Benign neoplasm of meninges, unspecified: Secondary | ICD-10-CM | POA: Diagnosis not present

## 2023-02-09 DIAGNOSIS — F411 Generalized anxiety disorder: Secondary | ICD-10-CM | POA: Diagnosis not present

## 2023-02-09 DIAGNOSIS — D696 Thrombocytopenia, unspecified: Secondary | ICD-10-CM | POA: Diagnosis not present

## 2023-02-09 DIAGNOSIS — J9601 Acute respiratory failure with hypoxia: Secondary | ICD-10-CM | POA: Diagnosis not present

## 2023-02-09 DIAGNOSIS — I119 Hypertensive heart disease without heart failure: Secondary | ICD-10-CM | POA: Diagnosis not present

## 2023-02-09 NOTE — Progress Notes (Unsigned)
Name: Vickie Stewart   MRN: [id number]    DOB: 1945-10-26   Date:02/10/2023       Progress Note  Subjective  Chief Complaint  Follow Up  HPI  Malnutrition: albumin was  down to 2.7 , she states her appetite is finally improving, eating more , discussed low zinc and she will start otc supplementation   Weakness legs: found on her first admission in July, possibly secondary to steroid myopathy, she graduated from walker to cane, feeling stronger, had home PT, currently weakness is more on left leg, right leg back to normal   Lesion bone: had PET scan done and looks reassuring, she saw Dr. Orlie Dakin and images were reviewed and patient was given reassurance   Lower extremity edema: likely from malnutrition , anemia and cadizem, on lower dose of cardizem now and swelling seems to have improved. Discussed compression stocking hoses and elevation   HTN: at discharge she was advised to hold bp medication due to hypotension but she continue taking cardizem CD 360 mg and olmasartan 20 mg daily , due to edema we decreased cardizem to 180 mg and increased olmasartan to 40 mg daily, she does not like taking diuretics. Swelling is better, she states bp in the mornings can be elevated in the 150-160 range but improves with 10 minutes rest. Denies chest pain or palpitation .  Meningioma: under the care of Dr Marcell Barlow   Patient Active Problem List   Diagnosis Date Noted   Inability to ambulate due to multiple joints 12/10/2022   PNA (pneumonia) 12/09/2022   Acute respiratory failure with hypoxia (HCC) 12/09/2022   Bilateral leg weakness 12/09/2022   Lower extremity weakness 12/04/2022   Thrombocytopenia (HCC) 12/04/2022   Pre-diabetes 12/04/2022   ASCUS of cervix with negative high risk HPV 01/07/2022   Occipital neuralgia of left side 01/07/2022   Balance problems 01/07/2022   Vertigo 01/07/2022   Pulsatile tinnitus of right ear 06/18/2020   Pulsatile neck mass 06/18/2020   OSA  (obstructive sleep apnea) 09/19/2019   Anxiety and depression 09/19/2019   Other neutropenia (HCC) 08/11/2019   Left thyroid nodule 08/08/2019   Chronic neck and back pain 01/03/2019   Meningioma (HCC) 10/04/2017   Lichen sclerosus 03/24/2016   Vaginal atrophy 03/18/2016   Encounter for follow-up surveillance of vaginal cancer 03/18/2016   Eczema 10/31/2015   Left hand pain 09/10/2015   Osteopenia after menopause 07/26/2015   Menopause 07/26/2015   Irritable colon 07/26/2015   History of cancer of vagina 07/26/2015   Diffuse cystic mastopathy 07/26/2015   Obesity (BMI 30.0-34.9) 07/26/2015   Intermittent low back pain 07/26/2015   Hyperlipemia 07/26/2015   Osteoarthritis 05/13/2015   Allergic rhinitis 05/13/2015   Gastroesophageal reflux disease without esophagitis 05/13/2015   Essential hypertension, benign     Past Surgical History:  Procedure Laterality Date   BREAST EXCISIONAL BIOPSY Left    neg   BREAST LUMPECTOMY Left    COLONOSCOPY  08/26/2006   COLONOSCOPY WITH PROPOFOL N/A 10/21/2016   Procedure: COLONOSCOPY WITH PROPOFOL;  Surgeon: Kieth Brightly, MD;  Location: [facility] ENDOSCOPY;  Service: Endoscopy;  Laterality: N/A;   ESOPHAGOGASTRODUODENOSCOPY (EGD) WITH PROPOFOL N/A 02/07/2020   Procedure: ESOPHAGOGASTRODUODENOSCOPY (EGD) WITH PROPOFOL;  Surgeon: Toney Reil, MD;  Location: [facility] ENDOSCOPY;  Service: Gastroenterology;  Laterality: N/A;    Family History  Problem Relation Age of Onset   Pulmonary embolism Mother    Hypertension Father    Aortic stenosis Father    Breast  cancer Neg Hx     Social History   Tobacco Use   Smoking status: Never   Smokeless tobacco: Never   Tobacco comments:    experimented with dip snuff as a child  Substance Use Topics   Alcohol use: No     Current Outpatient Medications:    aspirin 81 MG tablet, Take 81 mg by mouth daily. Reported on 10/10/2015, Disp: , Rfl:    cholecalciferol (VITAMIN D) 1000 units  tablet, Take 1,000 Units by mouth daily., Disp: , Rfl:    diltiazem (CARDIZEM CD) 180 MG 24 hr capsule, Take 1 capsule (180 mg total) by mouth daily., Disp: 30 capsule, Rfl: 0   latanoprost (XALATAN) 0.005 % ophthalmic solution, PLACE 1 DROP IN BOTH EYES AT BEDTIME, Disp: , Rfl: 4   magnesium oxide (MAG-OX) 400 (240 Mg) MG tablet, Take 400 mg by mouth daily., Disp: , Rfl:    Multiple Vitamin (MULTIVITAMIN WITH MINERALS) TABS tablet, Take 1 tablet by mouth daily., Disp: 30 tablet, Rfl: 0   olmesartan (BENICAR) 40 MG tablet, TAKE 1 TABLET BY MOUTH EVERY DAY, Disp: 30 tablet, Rfl: 0   Omega-3 Fatty Acids (FISH OIL CONCENTRATE PO), Take by mouth. Reported on 10/10/2015, Disp: , Rfl:    omeprazole (PRILOSEC) 20 MG capsule, Take 1 capsule (20 mg total) by mouth 2 (two) times daily before a meal., Disp: , Rfl:    rosuvastatin (CRESTOR) 10 MG tablet, Take 1 tablet (10 mg total) by mouth daily., Disp: 90 tablet, Rfl: 3   sucralfate (CARAFATE) 1 g tablet, Take 1 tablet (1 g total) by mouth in the morning, at noon, and at bedtime., Disp: 90 tablet, Rfl: 2  Allergies  Allergen Reactions   Atorvastatin     difficulty concentrating and focusing   Prednisone Swelling   Terbinafine And Related Dermatitis    Rash, itch, skin discoloration   Zetia [Ezetimibe] Other (See Comments)    Muscle/joint pain    I personally reviewed active problem list, medication list, allergies, family history, social history, health maintenance, notes from last encounter with the patient/caregiver today.   ROS  Ten systems reviewed and is negative except as mentioned in HPI    Objective  Vitals:   02/10/23 1131  BP: 126/70  Pulse: 83  Resp: 16  SpO2: 96%  Weight: 170 lb (77.1 kg)  Height: 5\' 1"  (1.549 m)    Body mass index is 32.12 kg/m.  Physical Exam  Constitutional: Patient appears well-developed and well-nourished. Obese No distress.  HEENT: head atraumatic, normocephalic, pupils equal and reactive to  light, neck supple Cardiovascular: Normal rate, regular rhythm and normal heart sounds.  No murmur heard. No BLE edema. Pulmonary/Chest: Effort normal and breath sounds normal. No respiratory distress. Abdominal: Soft.  There is no tenderness. Psychiatric: Patient has a normal mood and affect. behavior is normal. Judgment and thought content normal.    PHQ2/9:    02/10/2023   11:31 AM 01/18/2023    1:35 PM 12/31/2022   12:53 PM 09/28/2022   10:54 AM 07/10/2022   10:41 AM  Depression screen PHQ 2/9  Decreased Interest 0 0 0 0 0  Down, Depressed, Hopeless 0 0 0 0 0  PHQ - 2 Score 0 0 0 0 0  Altered sleeping 0   0 0  Tired, decreased energy 0   0 0  Change in appetite 0   0 0  Feeling bad or failure about yourself  0   0 0  Trouble  concentrating 0   0 0  Moving slowly or fidgety/restless 0   0 0  Suicidal thoughts 0   0 0  PHQ-9 Score 0   0 0    phq 9 is negative   Fall Risk:    02/10/2023   11:30 AM 01/18/2023    1:34 PM 12/31/2022   12:52 PM 09/28/2022   10:53 AM 07/10/2022   10:40 AM  Fall Risk   Falls in the past year? 1 1 1  0 0  Number falls in past yr: 1 1 1  0   Injury with Fall? 1 1 1  0   Risk for fall due to : Impaired balance/gait Impaired balance/gait History of fall(s);Impaired balance/gait;Impaired mobility No Fall Risks No Fall Risks  Follow up Falls prevention discussed Falls prevention discussed  Falls prevention discussed Falls prevention discussed;Education provided;Falls evaluation completed      Functional Status Survey: Is the patient deaf or have difficulty hearing?: No Does the patient have difficulty seeing, even when wearing glasses/contacts?: No Does the patient have difficulty concentrating, remembering, or making decisions?: No Does the patient have difficulty walking or climbing stairs?: Yes Does the patient have difficulty dressing or bathing?: No Does the patient have difficulty doing errands alone such as visiting a doctor's office or shopping?:  Yes    Assessment & Plan  1. Essential hypertension, benign  - olmesartan (BENICAR) 40 MG tablet; Take 1 tablet (40 mg total) by mouth daily.  Dispense: 90 tablet; Refill: 1 - diltiazem (CARDIZEM CD) 180 MG 24 hr capsule; Take 1 capsule (180 mg total) by mouth daily.  Dispense: 90 capsule; Refill: 1  2. Needs flu shot  - Flu Vaccine QUAD High Dose(Fluad)  4. Meningioma (HCC)  Under the care of neurosurgeon  5. Pure hypercholesterolemia  tolerating lower dose rosuvastatin   6. Gastroesophageal reflux disease without esophagitis  Controlled with PPI   7. Lesion of bone of thoracic spine   Reviewed by Dr. Orlie Dakin

## 2023-02-10 ENCOUNTER — Encounter: Payer: Self-pay | Admitting: Family Medicine

## 2023-02-10 ENCOUNTER — Ambulatory Visit
Admission: RE | Admit: 2023-02-10 | Discharge: 2023-02-10 | Disposition: A | Discharge status: Home / Self Care | Payer: Medicare PPO | Source: Ambulatory Visit | Attending: Family Medicine | Admitting: Family Medicine

## 2023-02-10 ENCOUNTER — Ambulatory Visit: Payer: Medicare PPO | Admitting: Family Medicine

## 2023-02-10 VITALS — BP 126/70 | HR 83 | Resp 16 | Ht 61.0 in | Wt 170.0 lb

## 2023-02-10 DIAGNOSIS — E440 Moderate protein-calorie malnutrition: Secondary | ICD-10-CM | POA: Diagnosis not present

## 2023-02-10 DIAGNOSIS — I10 Essential (primary) hypertension: Secondary | ICD-10-CM

## 2023-02-10 DIAGNOSIS — M899 Disorder of bone, unspecified: Secondary | ICD-10-CM

## 2023-02-10 DIAGNOSIS — Z23 Encounter for immunization: Secondary | ICD-10-CM

## 2023-02-10 DIAGNOSIS — K219 Gastro-esophageal reflux disease without esophagitis: Secondary | ICD-10-CM

## 2023-02-10 DIAGNOSIS — D329 Benign neoplasm of meninges, unspecified: Secondary | ICD-10-CM | POA: Diagnosis not present

## 2023-02-10 DIAGNOSIS — Z1231 Encounter for screening mammogram for malignant neoplasm of breast: Secondary | ICD-10-CM | POA: Insufficient documentation

## 2023-02-10 DIAGNOSIS — E7800 Pure hypercholesterolemia, unspecified: Secondary | ICD-10-CM | POA: Diagnosis not present

## 2023-02-10 MED ORDER — OLMESARTAN MEDOXOMIL 40 MG PO TABS: 40.0000 mg | ORAL_TABLET | Freq: Every day | ORAL | 1 refills | Status: DC

## 2023-02-10 MED ORDER — DILTIAZEM HCL ER COATED BEADS 180 MG PO CP24: 180.0000 mg | ORAL_CAPSULE | Freq: Every day | ORAL | 1 refills | Status: DC

## 2023-02-10 NOTE — Patient Instructions (Signed)
Buy over the count vitamin B1

## 2023-02-12 ENCOUNTER — Telehealth: Payer: Self-pay | Admitting: Family Medicine

## 2023-02-12 ENCOUNTER — Other Ambulatory Visit: Payer: Self-pay | Admitting: Family Medicine

## 2023-02-12 NOTE — Telephone Encounter (Signed)
Copied from CRM [id number]. Topic: General - Other >> Feb 12, 2023 12:49 PM Vickie Stewart wrote: Reason for CRM: Vickie Stewart has called to follow up on their previous refill request of olmesartan (BENICAR) 40 MG tablet [[id number]]  Vickie Stewart has been in contact with their pharmacy and been told that Vickie prescription is a 30 day supply rather than their previously discussed 90 day supply   Please contact Vickie Stewart further when possible

## 2023-02-16 DIAGNOSIS — E440 Moderate protein-calorie malnutrition: Secondary | ICD-10-CM | POA: Diagnosis not present

## 2023-02-16 DIAGNOSIS — E7800 Pure hypercholesterolemia, unspecified: Secondary | ICD-10-CM | POA: Diagnosis not present

## 2023-02-16 DIAGNOSIS — F411 Generalized anxiety disorder: Secondary | ICD-10-CM | POA: Diagnosis not present

## 2023-02-16 DIAGNOSIS — I700 Atherosclerosis of aorta: Secondary | ICD-10-CM | POA: Diagnosis not present

## 2023-02-16 DIAGNOSIS — D630 Anemia in neoplastic disease: Secondary | ICD-10-CM | POA: Diagnosis not present

## 2023-02-16 DIAGNOSIS — I119 Hypertensive heart disease without heart failure: Secondary | ICD-10-CM | POA: Diagnosis not present

## 2023-02-16 DIAGNOSIS — D329 Benign neoplasm of meninges, unspecified: Secondary | ICD-10-CM | POA: Diagnosis not present

## 2023-02-16 DIAGNOSIS — J9601 Acute respiratory failure with hypoxia: Secondary | ICD-10-CM | POA: Diagnosis not present

## 2023-02-16 DIAGNOSIS — D696 Thrombocytopenia, unspecified: Secondary | ICD-10-CM | POA: Diagnosis not present

## 2023-02-24 DIAGNOSIS — D696 Thrombocytopenia, unspecified: Secondary | ICD-10-CM | POA: Diagnosis not present

## 2023-02-24 DIAGNOSIS — E7800 Pure hypercholesterolemia, unspecified: Secondary | ICD-10-CM | POA: Diagnosis not present

## 2023-02-24 DIAGNOSIS — E440 Moderate protein-calorie malnutrition: Secondary | ICD-10-CM | POA: Diagnosis not present

## 2023-02-24 DIAGNOSIS — D630 Anemia in neoplastic disease: Secondary | ICD-10-CM | POA: Diagnosis not present

## 2023-02-24 DIAGNOSIS — I119 Hypertensive heart disease without heart failure: Secondary | ICD-10-CM | POA: Diagnosis not present

## 2023-02-24 DIAGNOSIS — I700 Atherosclerosis of aorta: Secondary | ICD-10-CM | POA: Diagnosis not present

## 2023-02-24 DIAGNOSIS — J9601 Acute respiratory failure with hypoxia: Secondary | ICD-10-CM | POA: Diagnosis not present

## 2023-02-24 DIAGNOSIS — F411 Generalized anxiety disorder: Secondary | ICD-10-CM | POA: Diagnosis not present

## 2023-02-24 DIAGNOSIS — D329 Benign neoplasm of meninges, unspecified: Secondary | ICD-10-CM | POA: Diagnosis not present

## 2023-02-24 NOTE — Telephone Encounter (Signed)
Copied from CRM [id number]. Topic: General - Inquiry >> Feb 24, 2023  2:01 PM Lennox Pippins wrote: The patient called in to ask her provider if she thought it would be safe to get her covid injection? She states steroids that she received a few months ago from the chemo paralyzed her and she is wanting to be cautious with this covid shot because of that. Please assist her further.   CRM was sent last week but turned into information only.  Patients callback #[phone] OR [phone]

## 2023-03-01 ENCOUNTER — Ambulatory Visit: Payer: Self-pay

## 2023-03-01 ENCOUNTER — Other Ambulatory Visit: Payer: Self-pay | Admitting: Nurse Practitioner

## 2023-03-01 DIAGNOSIS — R42 Dizziness and giddiness: Secondary | ICD-10-CM

## 2023-03-01 NOTE — Telephone Encounter (Signed)
  Chief Complaint: requesting therapy order and covid vaccine questions Symptoms: na Frequency: na Pertinent Negatives: Patient denies na Disposition: [] ED /[] Urgent Care (no appt availability in office) / [] Appointment(In office/virtual)/ []  [place] [facility]/ [] Home Care/ [] Refused Recommended Disposition /[] [place] [facility]/ [x]  Follow-up with PCP Additional Notes:    Patient requesting if PCP can order therapy again with Roseanne Reno on [address] due to insurance will not pay without another order. Also , does PCP feel it is ok for patient to get covid vaccine since she has completed radiation in June and does she need to go to [facility] or can she get covid shot in office?   Summary: Provider's name   Pt insists on having the provider's name mentioned in previous message. Its "Dr. Vonita Moss"  ----- Message from [place] E sent at 03/01/2023 12:15 PM EDT ----- Pt wants to speak to a nurse regarding a few things; needs a prescription for therapy on [address], seeking medical advice regarding the covid vaccine. Says she has been calling for weeks and has yet to receive feedback. No appt until next month  Best contact: [phone] or [phone]           Patient requesting a call back and or leave her a message on either phone # listed .    Reason for Disposition  [1] Caller requesting NON-URGENT health information AND [2] PCP's office is the best resource  Answer Assessment - Initial Assessment Questions 1. REASON FOR CALL or QUESTION: "What is your reason for calling today?" or "How can I best help you?" or "What question do you have that I can help answer?"     Patient requesting if PCP can order therapy again with Roseanne Reno on [address] due to insurance will not pay without another order. Also , does PCP feel it is ok for patient to get covid vaccine since she has completed radiation in June and does she need to [facility] or can she get covid shot in office?  Protocols used:  Information Only Call - No Triage-A-AH

## 2023-03-01 NOTE — Telephone Encounter (Signed)
-----   Message from [place] E sent at 03/01/2023 12:15 PM EDT -----  Pt wants to speak to a nurse regarding a few things; needs a prescription for therapy on [address], seeking medical advice regarding the covid vaccine. Says she has been calling for weeks and has yet to receive feedback. No appt until next month    Left message to call back.

## 2023-03-19 NOTE — Telephone Encounter (Signed)
Noted. I will take care of this closer to time.

## 2023-03-19 NOTE — Telephone Encounter (Signed)
Patient called our office to schedule an appointment after her MRI. She is scheduled to see Dr. Katrinka Blazing to review her Brain MRI on 06/16/2023. Could you please obtain authorization for this closer to time? She states that she has been having some dizziness and has been light headed over the last week or so, but thinks she has some inner ear issues going on. She states she will call our office back if she feels like she needs to be seen sooner.

## 2023-03-22 ENCOUNTER — Telehealth: Payer: Self-pay | Admitting: Family Medicine

## 2023-03-22 ENCOUNTER — Other Ambulatory Visit: Payer: Self-pay

## 2023-03-22 DIAGNOSIS — R29898 Other symptoms and signs involving the musculoskeletal system: Secondary | ICD-10-CM

## 2023-03-22 NOTE — Telephone Encounter (Signed)
Patient called in to see if she can get orders for physical therapy as she was partially paralyzed from chemo. Please f/u with patient as she says she has been trying to get an answer for over 3 weeks. Advised her she was able to get the covid shot per Dr Carlynn Purl on 10/6.

## 2023-03-30 DIAGNOSIS — G4733 Obstructive sleep apnea (adult) (pediatric): Secondary | ICD-10-CM | POA: Diagnosis not present

## 2023-03-31 LAB — FUNGUS CULTURE, BLOOD
Culture: NO GROWTH
Special Requests: ADEQUATE

## 2023-04-06 DIAGNOSIS — M6281 Muscle weakness (generalized): Secondary | ICD-10-CM | POA: Diagnosis not present

## 2023-04-16 DIAGNOSIS — M6281 Muscle weakness (generalized): Secondary | ICD-10-CM | POA: Diagnosis not present

## 2023-04-21 ENCOUNTER — Other Ambulatory Visit: Payer: Medicare PPO

## 2023-04-21 ENCOUNTER — Ambulatory Visit: Payer: Medicare PPO | Admitting: Oncology

## 2023-04-23 DIAGNOSIS — M6281 Muscle weakness (generalized): Secondary | ICD-10-CM | POA: Diagnosis not present

## 2023-04-24 ENCOUNTER — Emergency Department
Admission: EM | Admit: 2023-04-24 | Discharge: 2023-04-24 | Disposition: A | Discharge status: Home / Self Care | Payer: Medicare PPO | Attending: Student in an Organized Health Care Education/Training Program | Admitting: Student in an Organized Health Care Education/Training Program

## 2023-04-24 ENCOUNTER — Encounter: Payer: Self-pay | Admitting: Emergency Medicine

## 2023-04-24 ENCOUNTER — Emergency Department: Payer: Medicare PPO

## 2023-04-24 ENCOUNTER — Other Ambulatory Visit: Payer: Self-pay

## 2023-04-24 DIAGNOSIS — D329 Benign neoplasm of meninges, unspecified: Secondary | ICD-10-CM | POA: Insufficient documentation

## 2023-04-24 DIAGNOSIS — W01198A Fall on same level from slipping, tripping and stumbling with subsequent striking against other object, initial encounter: Secondary | ICD-10-CM | POA: Insufficient documentation

## 2023-04-24 DIAGNOSIS — R519 Headache, unspecified: Secondary | ICD-10-CM | POA: Diagnosis not present

## 2023-04-24 DIAGNOSIS — I10 Essential (primary) hypertension: Secondary | ICD-10-CM | POA: Insufficient documentation

## 2023-04-24 DIAGNOSIS — G936 Cerebral edema: Secondary | ICD-10-CM | POA: Diagnosis not present

## 2023-04-24 DIAGNOSIS — S0990XA Unspecified injury of head, initial encounter: Secondary | ICD-10-CM | POA: Diagnosis not present

## 2023-04-24 DIAGNOSIS — M5030 Other cervical disc degeneration, unspecified cervical region: Secondary | ICD-10-CM | POA: Diagnosis not present

## 2023-04-24 NOTE — Discharge Instructions (Addendum)
Your CT scan show mild edema around your meningioma.  This was discussed with the neurosurgeon on-call who would like you to follow-up in the clinic on Monday.  Please call the office on Monday, they will be expecting your call.  Please return for any new, worsening, or change in symptoms or other concerns.  It was a pleasure caring for you today.

## 2023-04-24 NOTE — ED Triage Notes (Signed)
Pt via POV from home. Pt had a mechanical fall today. Pt hit the back of her head. Denies LOC. Denies blood thinners. Pt c/o headache, 2/10. Pt is A&Ox4 and NAD. Ambulatory to triage w/ cane.

## 2023-04-24 NOTE — ED Provider Notes (Signed)
[facility] Provider Note    Event Date/Time   First MD Initiated Contact with Patient 04/24/23 [phone]     (approximate)   History   Fall   HPI  Vickie Stewart is a 77 y.o. female with a past medical history of meningioma s/p treatment with steroids and radiation for 6 weeks beginning in May 2024 with chronic left-sided weakness who presents today for evaluation after a trip and fall yesterday.  Patient reports that she was carrying groceries and lost her balance and fell backwards and struck her head.  She reports that she heard a crack which made her concerned.  She denies headache now.  She did not have any LOC.  She has not had any nausea or vomiting or visual changes.  She denies any new or different weakness or paresthesias from baseline.  She is not anticoagulated.  She is requesting a CT scan.  Patient Active Problem List   Diagnosis Date Noted   Inability to ambulate due to multiple joints 12/10/2022   PNA (pneumonia) 12/09/2022   Acute respiratory failure with hypoxia (HCC) 12/09/2022   Bilateral leg weakness 12/09/2022   Lower extremity weakness 12/04/2022   Thrombocytopenia (HCC) 12/04/2022   Pre-diabetes 12/04/2022   ASCUS of cervix with negative high risk HPV 01/07/2022   Occipital neuralgia of left side 01/07/2022   Balance problems 01/07/2022   Vertigo 01/07/2022   Pulsatile tinnitus of right ear 06/18/2020   Pulsatile neck mass 06/18/2020   OSA (obstructive sleep apnea) 09/19/2019   Anxiety and depression 09/19/2019   Other neutropenia (HCC) 08/11/2019   Left thyroid nodule 08/08/2019   Chronic neck and back pain 01/03/2019   Meningioma (HCC) 10/04/2017   Lichen sclerosus 03/24/2016   Vaginal atrophy 03/18/2016   Encounter for follow-up surveillance of vaginal cancer 03/18/2016   Eczema 10/31/2015   Left hand pain 09/10/2015   Osteopenia after menopause 07/26/2015   Menopause 07/26/2015   Irritable colon 07/26/2015    History of cancer of vagina 07/26/2015   Diffuse cystic mastopathy 07/26/2015   Obesity (BMI 30.0-34.9) 07/26/2015   Intermittent low back pain 07/26/2015   Hyperlipemia 07/26/2015   Osteoarthritis 05/13/2015   Allergic rhinitis 05/13/2015   Gastroesophageal reflux disease without esophagitis 05/13/2015   Essential hypertension, benign           Physical Exam   Triage Vital Signs: ED Triage Vitals  Encounter Vitals Group     BP 04/24/23 0824 (!) 150/66     Systolic BP Percentile --      Diastolic BP Percentile --      Pulse Rate 04/24/23 0824 65     Resp 04/24/23 0824 20     Temp 04/24/23 0824 97.9 F (36.6 C)     Temp Source 04/24/23 0824 Oral     SpO2 04/24/23 0824 100 %     Weight 04/24/23 0823 165 lb (74.8 kg)     Height 04/24/23 0823 5\' 1"  (1.549 m)     Head Circumference --      Peak Flow --      Pain Score 04/24/23 0823 2     Pain Loc --      Pain Education --      Exclude from Growth Chart --     Most recent vital signs: Vitals:   04/24/23 0824 04/24/23 0850  BP: (!) 150/66   Pulse: 65   Resp: 20   Temp: 97.9 F (36.6 C)   SpO2: 100%  100%    Physical Exam Vitals and nursing note reviewed.  Constitutional:      General: Awake and alert. No acute distress.    Appearance: Normal appearance. The patient is obese.  HENT:     Head: Normocephalic and atraumatic.     Mouth: Mucous membranes are moist.  Eyes:     General: PERRL. Normal EOMs        Right eye: No discharge.        Left eye: No discharge.     Conjunctiva/sclera: Conjunctivae normal.  Cardiovascular:     Rate and Rhythm: Normal rate and regular rhythm.     Pulses: Normal pulses.  Pulmonary:     Effort: Pulmonary effort is normal. No respiratory distress.     Breath sounds: Normal breath sounds.  Abdominal:     Abdomen is soft. There is no abdominal tenderness. No rebound or guarding. No distention. Musculoskeletal:        General: No swelling. Normal range of motion.     Cervical  back: Normal range of motion and neck supple. No midline cervical spine tenderness.  Full range of motion of neck.  Negative Spurling test.  Negative Lhermitte sign.  Normal strength and sensation in bilateral upper extremities. Normal grip strength bilaterally.  Normal intrinsic muscle function of the hand bilaterally.  Normal radial pulses bilaterally. Skin:    General: Skin is warm and dry.     Capillary Refill: Capillary refill takes less than 2 seconds.     Findings: No rash.  Neurological:     Mental Status: The patient is awake and alert.  No focal neurological deficits, at baseline.   ED Results / Procedures / Treatments   Labs (all labs ordered are listed, but only abnormal results are displayed) Labs Reviewed - No data to display   EKG     RADIOLOGY I independently reviewed and interpreted imaging and agree with radiologists findings.     PROCEDURES:  Critical Care performed:   Procedures   MEDICATIONS ORDERED IN ED: Medications - No data to display   IMPRESSION / MDM / ASSESSMENT AND PLAN / ED COURSE  I reviewed the triage vital signs and the nursing notes.   Differential diagnosis includes, but is not limited to, contusion, concussion, skull fracture, intracranial hemorrhage.  Patient is awake and alert, hemodynamically stable and afebrile.  She is neurologically intact, and at her baseline.  She has no focal neurological deficits.  She is ambulatory with a cane per her baseline.  CT head and neck obtained.  CT neck does not reveal any acute findings.  CT head reveals no meningioma, with mildly increased adjacent cerebral edema.  These findings were discussed with neurosurgery on-call, Dr. Alta Corning, who recommends outpatient follow-up in clinic.  He does not feel that she needs MRI today, admission, or steroids.  He reports that this is sometimes a post radiation finding.  I also discussed these findings with the patient who agrees to call the neurosurgery  office on Monday to arrange close outpatient follow-up.  She is at her baseline currently, is ambulatory with a steady gait, does not report any new symptoms.  She is comfortable with this plan.  She does understand strict return precautions in the meantime.  Patient was discharged in stable condition.   Patient's presentation is most consistent with acute presentation with potential threat to life or bodily function.  } Clinical Course as of 04/24/23 1434  Sat Apr 24, 2023  0935 Discussed with  Dr. Alta Corning with neurosurgery who recommends outpatient follow-up.  He reports that they will likely order an outpatient MRI but this does not need to be done today.  He also does not feel that she needs to be restarted on steroids right now.  Patient is instructed to call the office on Monday. [JP]    Clinical Course User Index [JP] Christian Treadway, Herb Grays, PA-C     FINAL CLINICAL IMPRESSION(S) / ED DIAGNOSES   Final diagnoses:  Injury of head, initial encounter  Meningioma [facility])     Rx / DC Orders   ED Discharge Orders     None        Note:  This document was prepared using Dragon voice recognition software and may include unintentional dictation errors.   Keturah Shavers 04/24/23 1434    Willy Eddy, MD 04/24/23 [phone]

## 2023-04-26 ENCOUNTER — Telehealth: Payer: Self-pay | Admitting: Neurosurgery

## 2023-04-26 NOTE — Telephone Encounter (Signed)
Patient was seen in the ER on 11/23 due to a fall and she had 2 CT scans done. She states she was told to be see in the office this week. She has an appt with Dr.Smith in January but she saw Dr.Yarbrough early this year and was told to follow up in one year after scan.  Can I schedule her for the next available opening with PA? Should she see Dr.Yarbrough or Dr.Smith for the 1 year fu?

## 2023-04-26 NOTE — Telephone Encounter (Signed)
She understood that the ER doctor spoke to someone in our office that she needed to be seen this week. If we can not get her scheduled this week and there is no urgency to this appt she will just wait until her appt in January. Is that ok?

## 2023-04-27 NOTE — Telephone Encounter (Addendum)
If she is concerned, Dr Katrinka Blazing said he will see her tomorrow at 9:15am. Otherwise, it is OK for her to wait until January.

## 2023-04-27 NOTE — Telephone Encounter (Signed)
She confirmed tomorrow at [facility]

## 2023-04-27 NOTE — Telephone Encounter (Signed)
Patient is calling back, should she be seen asap for the swelling around her brain? She concerned.

## 2023-04-28 ENCOUNTER — Ambulatory Visit: Payer: Medicare PPO | Admitting: Neurosurgery

## 2023-04-28 ENCOUNTER — Encounter: Payer: Self-pay | Admitting: Neurosurgery

## 2023-04-28 VITALS — BP 148/82 | Ht 61.0 in | Wt 165.0 lb

## 2023-04-28 DIAGNOSIS — D329 Benign neoplasm of meninges, unspecified: Secondary | ICD-10-CM | POA: Diagnosis not present

## 2023-04-28 DIAGNOSIS — Z86018 Personal history of other benign neoplasm: Secondary | ICD-10-CM

## 2023-04-28 NOTE — Progress Notes (Signed)
Referring Physician:  Alba Cory, MD [address] [place],  [place] [address]  Primary Physician:  Alba Cory, MD  History of Present Illness: 04/28/2023 [name] is here today with a chief complaint of a known meningioma.  She is well-known to our practice.  She had a recent fall and had cranial imaging which demonstrated some edema potentially worsened.  She does continue to complain of intermittent difficulty with walking which has been a prominent symptom for at least the past 4 to 5 months.  She also reports some headaches.  I have utilized the care everywhere function in epic to review the outside records available from external health systems.  Review of Systems:  A 10 point review of systems is negative, except for the pertinent positives and negatives detailed in the HPI.  Past Medical History: Past Medical History:  Diagnosis Date   Allergic rhinitis, cause unspecified    Anxiety state, unspecified    Contact dermatitis and other eczema, due to unspecified cause    Diffuse cystic mastopathy    Dyspepsia and other specified disorders of function of stomach    Essential hypertension, benign    Glaucoma    Irritable bowel syndrome    Lateral epicondylitis  of elbow    Leukocytopenia, unspecified    Mastodynia    Obesity, unspecified    Pure hypercholesterolemia    Reflux esophagitis    Thoracic or lumbosacral neuritis or radiculitis, unspecified    Unspecified disorder of skin and subcutaneous tissue    Vaginal cancer (HCC)    History    Past Surgical History: Past Surgical History:  Procedure Laterality Date   BREAST EXCISIONAL BIOPSY Left    neg   BREAST LUMPECTOMY Left    COLONOSCOPY  08/26/2006   COLONOSCOPY WITH PROPOFOL N/A 10/21/2016   Procedure: COLONOSCOPY WITH PROPOFOL;  Surgeon: Kieth Brightly, MD;  Location: [facility] ENDOSCOPY;  Service: Endoscopy;  Laterality: N/A;   ESOPHAGOGASTRODUODENOSCOPY (EGD) WITH  PROPOFOL N/A 02/07/2020   Procedure: ESOPHAGOGASTRODUODENOSCOPY (EGD) WITH PROPOFOL;  Surgeon: Toney Reil, MD;  Location: [facility] ENDOSCOPY;  Service: Gastroenterology;  Laterality: N/A;    Allergies: Allergies as of 04/28/2023 - Review Complete 04/28/2023  Allergen Reaction Noted   Atorvastatin  07/26/2015   Prednisone Swelling 01/18/2023   Terbinafine and related Dermatitis 01/23/2016   Zetia [ezetimibe] Other (See Comments) 07/10/2022    Medications:  Current Outpatient Medications:    ascorbic acid (VITAMIN C) 500 MG tablet, Take 500 mg by mouth daily., Disp: , Rfl:    aspirin 81 MG tablet, Take 81 mg by mouth daily. Reported on 10/10/2015, Disp: , Rfl:    cholecalciferol (VITAMIN D) 1000 units tablet, Take 1,000 Units by mouth daily., Disp: , Rfl:    diltiazem (CARDIZEM CD) 180 MG 24 hr capsule, Take 1 capsule (180 mg total) by mouth daily., Disp: 90 capsule, Rfl: 1   latanoprost (XALATAN) 0.005 % ophthalmic solution, PLACE 1 DROP IN BOTH EYES AT BEDTIME, Disp: , Rfl: 4   magnesium oxide (MAG-OX) 400 (240 Mg) MG tablet, Take 400 mg by mouth daily., Disp: , Rfl:    olmesartan (BENICAR) 40 MG tablet, Take 1 tablet (40 mg total) by mouth daily., Disp: 90 tablet, Rfl: 1   Omega-3 Fatty Acids (FISH OIL CONCENTRATE PO), Take by mouth. Reported on 10/10/2015, Disp: , Rfl:    omeprazole (PRILOSEC) 20 MG capsule, Take 1 capsule (20 mg total) by mouth 2 (two) times daily before a meal.,  Disp: , Rfl:    rosuvastatin (CRESTOR) 10 MG tablet, Take 1 tablet (10 mg total) by mouth daily., Disp: 90 tablet, Rfl: 3  Social History: Social History   Tobacco Use   Smoking status: Never   Smokeless tobacco: Never   Tobacco comments:    experimented with dip snuff as a child  Vaping Use   Vaping status: Never Used  Substance Use Topics   Alcohol use: No   Drug use: No    Family Medical History: Family History  Problem Relation Age of Onset   Pulmonary embolism Mother    Hypertension  Father    Aortic stenosis Father    Breast cancer Neg Hx     Physical Examination: Vitals:   04/28/23 0911  BP: (!) 148/82    General: Patient is well developed, well nourished, calm, collected, and in no apparent distress. Attention to examination is appropriate.  Neck:   Supple.  Full range of motion.  Respiratory: Patient is breathing without any difficulty.   NEUROLOGICAL:     Awake, alert, oriented to person, place, and time.  Speech is clear and fluent.   Cranial Nerves: Pupils equal round and reactive to light.  Facial tone is symmetric.  Facial sensation is symmetric. Shoulder shrug is symmetric. Tongue protrusion is midline.  There is no pronator drift.  ROM of spine: full.    Strength: Side Iliopsoas Quads Hamstring PF DF EHL  R 5 5 5 5 5 5   L 5 5 5 5 5 5    Reflexes are 1+ and symmetric at the biceps, triceps, brachioradialis, patella and achilles.   Hoffman's is absent.   Bilateral upper and lower extremity sensation is intact to light touch.    No evidence of dysmetria noted.  Gait is normal.     Medical Decision Making  Imaging: Narrative & Impression  CLINICAL DATA:  77 year old female status post fall. Struck back of head. Headache.   EXAM: CT HEAD WITHOUT CONTRAST   TECHNIQUE: Contiguous axial images were obtained from the base of the skull through the vertex without intravenous contrast.   RADIATION DOSE REDUCTION: This exam was performed according to the departmental dose-optimization program which includes automated exposure control, adjustment of the mA and/or kV according to patient size and/or use of iterative reconstruction technique.   COMPARISON:  Brain MRI 12/04/2022.  Head CT 12/08/2022.   FINDINGS: Brain: Chronic cerebral edema right posterosuperior frontal gyrus related to chronic 2-3 cm right para falcine meningioma which is better demonstrated by MRI (series 2, image 22).   Edema does appear mildly progressed since the  July CT. No midline shift. No ventriculomegaly or significant ventricular mass effect.   No superimposed No acute intracranial hemorrhage identified. No cortically based acute infarct identified. Stable gray-white differentiation elsewhere.   Vascular: Calcified atherosclerosis at the skull base. No suspicious intracranial vascular hyperdensity.   Skull: Stable and intact.   Sinuses/Orbits: Visualized paranasal sinuses and mastoids are stable and well aerated.   Other: No orbit or scalp soft tissue injury identified.   IMPRESSION: 1. Chronic 2-3 cm right para falcine Meningioma with mildly increased adjacent cerebral edema since July. No midline shift. No other significant intracranial mass effect. 2. No acute traumatic injury identified.     Electronically Signed   By: Odessa Fleming M.D.   On: 04/24/2023 09:21    Assessment and Plan: Ms. Zoll is a pleasant 77 y.o. female with likely meningioma with some worsening edema and a recent head CT.  She has had edema and the perilesional area since at least 3 years ago.  This appears to be slowly increasing.  At this point we will plan for repeat MRI.  Clinically she appears relatively stable.  She has previously had a significant episode of myopathy with steroid administration so would like to avoid steroids in the setting of this worsening edema.  Will plan to follow-up with her after repeat MRI.    I spent a total of 30 minutes in this patient's care today. This time was spent reviewing pertinent records including imaging studies, obtaining and confirming history, performing a directed evaluation, formulating and discussing my recommendations, and documenting the visit within the medical record.   Thank you for involving me in the care of this patient.   Lovenia Kim, MD Neurosurgery

## 2023-05-04 ENCOUNTER — Telehealth: Payer: Self-pay | Admitting: Neurosurgery

## 2023-05-04 ENCOUNTER — Other Ambulatory Visit: Payer: Self-pay | Admitting: Physician Assistant

## 2023-05-04 MED ORDER — DIAZEPAM 2 MG PO TABS: 2.0000 mg | ORAL_TABLET | Freq: Once | ORAL | 0 refills | Status: AC

## 2023-05-04 NOTE — Telephone Encounter (Signed)
12/9 MRI brain She needs valium sent to [facility]

## 2023-05-05 NOTE — Telephone Encounter (Signed)
Patient is calling to see if her medication has been sent in to the pharmacy.

## 2023-05-05 NOTE — Telephone Encounter (Signed)
Patient notified RX ready to pick up from pharmacy.

## 2023-05-06 NOTE — Progress Notes (Signed)
Name: Vickie Stewart   MRN: [id number]    DOB: 22-May-1946   Date:05/14/2023       Progress Note  Subjective  Chief Complaint  Chief Complaint  Patient presents with   Medical Management of Chronic Issues    HPI  Discussed the use of AI scribe software for clinical note transcription with the patient, who gave verbal consent to proceed.  History of Present Illness   The patient, with a known history of meningioma, presented for a regular follow-up visit. However, the patient reported a recent fall where she hit her head and heard a cracking sound. This incident led to a hospital visit where a CT scan of the brain and neck was performed. The scans revealed no fractures, only the pre-existing meningioma and some swelling, as well as neck arthritis. The patient did not lose consciousness or vomit post-fall and reported visiting the hospital as a precaution.  Following the fall, the patient underwent an MRI ordered by her neurosurgeon. The MRI results, compared with a previous scan from July showed stable meningioma  size, however, there was a slight increase in edema within the right frontal and right parietal lobe since the July MRI, but no change since the CT scan in November post-fall. The patient also has a history of mild chronic small vessel ischemic change.  The patient reported a decline in her physical condition, stating she was "going backwards instead of forwards." She noted that she was walking without a walker for a few weeks in the month of August but for months her balanced and strength has been gradually getting worse and is no longer able to do ADL like sweping her floor, showering, cooking or driving ( brother brought her here today)  The patient states weakness is likely the cause of her recent fall end of Nov   The patient is currently on a regimen of rosuvastatin, baby aspirin, diltiazem 180, and olmesartan 40 for her hypertension, hyperlipidemia, and atherosclerosis of  the aorta. She also reported taking vitamins and medication for reflux. The patient also has a history of steroid-induced diabetes, which was managed with medication. However, she is not currently on any diabetes medication. The patient's blood pressure was slightly elevated at the start of the consultation but normalized during the visit.  Contacted neurosurgeon and Dr. Marcell Barlow recommended follow up with him, there is hesitation due to the patient's previous adverse reaction to steroids, which caused myopathy.         Patient Active Problem List   Diagnosis Date Noted   Inability to ambulate due to multiple joints 12/10/2022   PNA (pneumonia) 12/09/2022   Acute respiratory failure with hypoxia (HCC) 12/09/2022   Bilateral leg weakness 12/09/2022   Lower extremity weakness 12/04/2022   Thrombocytopenia (HCC) 12/04/2022   Pre-diabetes 12/04/2022   ASCUS of cervix with negative high risk HPV 01/07/2022   Occipital neuralgia of left side 01/07/2022   Balance problems 01/07/2022   Vertigo 01/07/2022   Pulsatile tinnitus of right ear 06/18/2020   Pulsatile neck mass 06/18/2020   OSA (obstructive sleep apnea) 09/19/2019   Anxiety and depression 09/19/2019   Other neutropenia (HCC) 08/11/2019   Left thyroid nodule 08/08/2019   Chronic neck and back pain 01/03/2019   Meningioma (HCC) 10/04/2017   Lichen sclerosus 03/24/2016   Vaginal atrophy 03/18/2016   Encounter for follow-up surveillance of vaginal cancer 03/18/2016   Eczema 10/31/2015   Left hand pain 09/10/2015   Osteopenia after menopause 07/26/2015   Menopause  07/26/2015   Irritable colon 07/26/2015   History of cancer of vagina 07/26/2015   Diffuse cystic mastopathy 07/26/2015   Obesity (BMI 30.0-34.9) 07/26/2015   Intermittent low back pain 07/26/2015   Hyperlipemia 07/26/2015   Osteoarthritis 05/13/2015   Allergic rhinitis 05/13/2015   Gastroesophageal reflux disease without esophagitis 05/13/2015   Essential  hypertension, benign     Past Surgical History:  Procedure Laterality Date   BREAST EXCISIONAL BIOPSY Left    neg   BREAST LUMPECTOMY Left    COLONOSCOPY  08/26/2006   COLONOSCOPY WITH PROPOFOL N/A 10/21/2016   Procedure: COLONOSCOPY WITH PROPOFOL;  Surgeon: Kieth Brightly, MD;  Location: [facility] ENDOSCOPY;  Service: Endoscopy;  Laterality: N/A;   ESOPHAGOGASTRODUODENOSCOPY (EGD) WITH PROPOFOL N/A 02/07/2020   Procedure: ESOPHAGOGASTRODUODENOSCOPY (EGD) WITH PROPOFOL;  Surgeon: Toney Reil, MD;  Location: [facility] ENDOSCOPY;  Service: Gastroenterology;  Laterality: N/A;    Family History  Problem Relation Age of Onset   Pulmonary embolism Mother    Hypertension Father    Aortic stenosis Father    Breast cancer Neg Hx     Social History   Tobacco Use   Smoking status: Never   Smokeless tobacco: Never   Tobacco comments:    experimented with dip snuff as a child  Substance Use Topics   Alcohol use: No     Current Outpatient Medications:    ascorbic acid (VITAMIN C) 500 MG tablet, Take 500 mg by mouth daily., Disp: , Rfl:    aspirin 81 MG tablet, Take 81 mg by mouth daily. Reported on 10/10/2015, Disp: , Rfl:    cholecalciferol (VITAMIN D) 1000 units tablet, Take 1,000 Units by mouth daily., Disp: , Rfl:    diltiazem (CARDIZEM CD) 180 MG 24 hr capsule, Take 1 capsule (180 mg total) by mouth daily., Disp: 90 capsule, Rfl: 1   latanoprost (XALATAN) 0.005 % ophthalmic solution, PLACE 1 DROP IN BOTH EYES AT BEDTIME, Disp: , Rfl: 4   magnesium oxide (MAG-OX) 400 (240 Mg) MG tablet, Take 400 mg by mouth daily., Disp: , Rfl:    olmesartan (BENICAR) 40 MG tablet, Take 1 tablet (40 mg total) by mouth daily., Disp: 90 tablet, Rfl: 1   Omega-3 Fatty Acids (FISH OIL CONCENTRATE PO), Take by mouth. Reported on 10/10/2015, Disp: , Rfl:    omeprazole (PRILOSEC) 20 MG capsule, Take 1 capsule (20 mg total) by mouth 2 (two) times daily before a meal., Disp: , Rfl:    rosuvastatin  (CRESTOR) 10 MG tablet, Take 1 tablet (10 mg total) by mouth daily., Disp: 90 tablet, Rfl: 3  Allergies  Allergen Reactions   Atorvastatin     difficulty concentrating and focusing   Prednisone Swelling   Terbinafine And Related Dermatitis    Rash, itch, skin discoloration   Zetia [Ezetimibe] Other (See Comments)    Muscle/joint pain    I personally reviewed active problem list, medication list, allergies with the patient/caregiver today.   ROS  Ten systems reviewed and is negative except as mentioned in HPI    Objective  Vitals:   05/14/23 1100 05/14/23 1117  BP: (!) 142/74 130/70  Pulse: 77   Resp: 16   Temp: 97.9 F (36.6 C)   TempSrc: Oral   SpO2: 98%   Weight: 163 lb 3.2 oz (74 kg)   Height: 5\' 1"  (1.549 m)     Body mass index is 30.84 kg/m.  Physical Exam  Constitutional: Patient appears well-developed  No distress.  HEENT: head  atraumatic, normocephalic, pupils equal and reactive to light, neck supple Cardiovascular: Normal rate, regular rhythm and normal heart sounds.  No murmur heard. No BLE edema. Pulmonary/Chest: Effort normal and breath sounds normal. No respiratory distress. Muscular skeletal: using a walker, weak on lower extremity  Neuro: unsteady gait , mild fine hand tremor Abdominal: Soft.  There is no tenderness. Psychiatric: Patient has a normal mood and affect. behavior is normal. Judgment and thought content normal.   PHQ2/9:    05/14/2023   11:01 AM 02/10/2023   11:31 AM 01/18/2023    1:35 PM 12/31/2022   12:53 PM 09/28/2022   10:54 AM  Depression screen PHQ 2/9  Decreased Interest 0 0 0 0 0  Down, Depressed, Hopeless 0 0 0 0 0  PHQ - 2 Score 0 0 0 0 0  Altered sleeping 0 0   0  Tired, decreased energy 0 0   0  Change in appetite 0 0   0  Feeling bad or failure about yourself  0 0   0  Trouble concentrating 0 0   0  Moving slowly or fidgety/restless 0 0   0  Suicidal thoughts 0 0   0  PHQ-9 Score 0 0   0  Difficult doing  work/chores Not difficult at all        phq 9 is negative   Fall Risk:    02/10/2023   11:30 AM 01/18/2023    1:34 PM 12/31/2022   12:52 PM 09/28/2022   10:53 AM 07/10/2022   10:40 AM  Fall Risk   Falls in the past year? 1 1 1  0 0  Number falls in past yr: 1 1 1  0   Injury with Fall? 1 1 1  0   Risk for fall due to : Impaired balance/gait Impaired balance/gait History of fall(s);Impaired balance/gait;Impaired mobility No Fall Risks No Fall Risks  Follow up Falls prevention discussed Falls prevention discussed  Falls prevention discussed Falls prevention discussed;Education provided;Falls evaluation completed     Assessment & Plan  Assessment and Plan    Meningioma Stable size since radiation in May, but increased edema noted on recent MRI compared to July. Recent fall with no loss of consciousness or vomiting. Patient reports progressive weakness and balance issues since end of August, leading to increased reliance on walker. 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 -Contacted neurosurgeon for further management plan, considering potential need for steroids despite previous complications. -Initiate home health aide and physical therapy for safety and improvement of mobility.  Steroid-induced Diabetes History of elevated A1c (9) during hospitalization with steroid treatment. No current diabetes medication. -Check A1c to confirm current status.  Hyperlipidemia and Atherosclerosis of the Aorta and Small vessel Disease Currently managed with Rosuvastatin. -Continue Rosuvastatin.  Hypertension Managed with Diltiazem 180mg  and Olmesartan 40mg . Recent blood pressure within normal range. -Continue current antihypertensive regimen.  Malnutrition Previous low albumin levels. Patient reports good appetite and protein-rich diet. -Check comprehensive panel including liver enzymes and albumin.  Right Upper Quadrant Pain Patient reports occasional pain in this area. No  known liver disease. -Check liver enzymes as part of comprehensive panel.  General Health Maintenance -Continue Aspirin 81mg  daily. -Continue Vitamin D3 supplementation. -Continue current reflux medication. -Consider COVID-19 vaccination.

## 2023-05-07 NOTE — Telephone Encounter (Signed)
She picked up the valium 2mg  rx. She usually gets 5mg  tablets. Are you sure that 2mg  will be enough to get her through the scan? [phone]

## 2023-05-10 ENCOUNTER — Ambulatory Visit
Admission: RE | Admit: 2023-05-10 | Discharge: 2023-05-10 | Disposition: A | Discharge status: Home / Self Care | Payer: Medicare PPO | Source: Ambulatory Visit | Attending: Neurosurgery | Admitting: Neurosurgery

## 2023-05-10 DIAGNOSIS — Z86018 Personal history of other benign neoplasm: Secondary | ICD-10-CM | POA: Diagnosis not present

## 2023-05-10 DIAGNOSIS — I6782 Cerebral ischemia: Secondary | ICD-10-CM | POA: Diagnosis not present

## 2023-05-10 DIAGNOSIS — G936 Cerebral edema: Secondary | ICD-10-CM | POA: Diagnosis not present

## 2023-05-10 DIAGNOSIS — R200 Anesthesia of skin: Secondary | ICD-10-CM | POA: Diagnosis not present

## 2023-05-10 DIAGNOSIS — D320 Benign neoplasm of cerebral meninges: Secondary | ICD-10-CM | POA: Diagnosis not present

## 2023-05-10 MED ORDER — GADOBUTROL 1 MMOL/ML IV SOLN
7.0000 mL | Freq: Once | INTRAVENOUS | Status: AC | PRN
  Administered 2023-05-10: 7 mL via INTRAVENOUS

## 2023-05-14 ENCOUNTER — Encounter: Payer: Self-pay | Admitting: Family Medicine

## 2023-05-14 ENCOUNTER — Ambulatory Visit: Payer: Medicare PPO | Admitting: Family Medicine

## 2023-05-14 VITALS — BP 130/70 | HR 77 | Temp 97.9°F | Resp 16 | Ht 61.0 in | Wt 163.2 lb

## 2023-05-14 DIAGNOSIS — G4733 Obstructive sleep apnea (adult) (pediatric): Secondary | ICD-10-CM | POA: Diagnosis not present

## 2023-05-14 DIAGNOSIS — E440 Moderate protein-calorie malnutrition: Secondary | ICD-10-CM | POA: Diagnosis not present

## 2023-05-14 DIAGNOSIS — I739 Peripheral vascular disease, unspecified: Secondary | ICD-10-CM

## 2023-05-14 DIAGNOSIS — Z9181 History of falling: Secondary | ICD-10-CM

## 2023-05-14 DIAGNOSIS — K219 Gastro-esophageal reflux disease without esophagitis: Secondary | ICD-10-CM | POA: Diagnosis not present

## 2023-05-14 DIAGNOSIS — I10 Essential (primary) hypertension: Secondary | ICD-10-CM

## 2023-05-14 DIAGNOSIS — D329 Benign neoplasm of meninges, unspecified: Secondary | ICD-10-CM | POA: Diagnosis not present

## 2023-05-14 DIAGNOSIS — E099 Drug or chemical induced diabetes mellitus without complications: Secondary | ICD-10-CM

## 2023-05-14 DIAGNOSIS — I700 Atherosclerosis of aorta: Secondary | ICD-10-CM | POA: Diagnosis not present

## 2023-05-14 DIAGNOSIS — T380X5D Adverse effect of glucocorticoids and synthetic analogues, subsequent encounter: Secondary | ICD-10-CM | POA: Diagnosis not present

## 2023-05-15 LAB — COMPLETE METABOLIC PANEL WITH GFR
AG Ratio: 1.9 (calc) (ref 1.0–2.5)
ALT: 46 U/L — ABNORMAL HIGH (ref 6–29)
AST: 30 U/L (ref 10–35)
Albumin: 4.5 g/dL (ref 3.6–5.1)
Alkaline phosphatase (APISO): 95 U/L (ref 37–153)
BUN: 15 mg/dL (ref 7–25)
CO2: 28 mmol/L (ref 20–32)
Calcium: 10 mg/dL (ref 8.6–10.4)
Chloride: 104 mmol/L (ref 98–110)
Creat: 0.66 mg/dL (ref 0.60–1.00)
Globulin: 2.4 g/dL (ref 1.9–3.7)
Glucose, Bld: 93 mg/dL (ref 65–99)
Potassium: 4.2 mmol/L (ref 3.5–5.3)
Sodium: 140 mmol/L (ref 135–146)
Total Bilirubin: 0.4 mg/dL (ref 0.2–1.2)
Total Protein: 6.9 g/dL (ref 6.1–8.1)
eGFR: 91 mL/min/{1.73_m2} (ref >=60)

## 2023-05-15 LAB — HEMOGLOBIN A1C
Hgb A1c MFr Bld: 6.3 %{Hb} — ABNORMAL HIGH (ref <5.7)
Mean Plasma Glucose: 134 mg/dL
eAG (mmol/L): 7.4 mmol/L

## 2023-05-17 ENCOUNTER — Other Ambulatory Visit: Payer: Self-pay | Admitting: Family Medicine

## 2023-05-17 DIAGNOSIS — R2689 Other abnormalities of gait and mobility: Secondary | ICD-10-CM

## 2023-05-17 DIAGNOSIS — D329 Benign neoplasm of meninges, unspecified: Secondary | ICD-10-CM

## 2023-05-18 ENCOUNTER — Ambulatory Visit: Payer: Medicare PPO | Admitting: Neurosurgery

## 2023-05-18 VITALS — BP 134/84 | Ht 61.0 in | Wt 163.0 lb

## 2023-05-18 DIAGNOSIS — D329 Benign neoplasm of meninges, unspecified: Secondary | ICD-10-CM

## 2023-05-18 MED ORDER — DIAZEPAM 5 MG PO TABS: 5.0000 mg | ORAL_TABLET | Freq: Once | ORAL | 0 refills | Status: AC

## 2023-05-18 NOTE — Progress Notes (Signed)
Referring Physician:  Alba Cory, MD [address] [place],  [place] [address]  Primary Physician:  Alba Cory, MD  History of Present Illness: 05/18/2023 Since I last saw her, she has been dealing with some weakness in her left leg.  She underwent radiation treatment for her meningioma.  She is not interested in considering surgery for her intracranial lesion.  She had significant bilateral lower extremity weakness in July that could have been secondary to steroid induced myopathy.  09/22/2022 [name] is here today with a chief complaint of a known meningioma.  She recently had this imaged which showed an increase in size and change in imaging characteristics.  She has been having some increased headaches.  She reports some intermittent dizziness.    She reports some decreased sensation on the left side of her body.  She denies any weakness.    Past Surgery: denies  [name] has no symptoms of cervical myelopathy.  The symptoms are causing a significant impact on the patient's life.   I have utilized the care everywhere function in epic to review the outside records available from external health systems.  Review of Systems:  A 10 point review of systems is negative, except for the pertinent positives and negatives detailed in the HPI.  Past Medical History: Past Medical History:  Diagnosis Date   Allergic rhinitis, cause unspecified    Anxiety state, unspecified    Contact dermatitis and other eczema, due to unspecified cause    Diffuse cystic mastopathy    Dyspepsia and other specified disorders of function of stomach    Essential hypertension, benign    Glaucoma    Irritable bowel syndrome    Lateral epicondylitis  of elbow    Leukocytopenia, unspecified    Mastodynia    Obesity, unspecified    Pure hypercholesterolemia    Reflux esophagitis    Thoracic or lumbosacral neuritis or radiculitis, unspecified     Unspecified disorder of skin and subcutaneous tissue    Vaginal cancer (HCC)    History    Past Surgical History: Past Surgical History:  Procedure Laterality Date   BREAST EXCISIONAL BIOPSY Left    neg   BREAST LUMPECTOMY Left    COLONOSCOPY  08/26/2006   COLONOSCOPY WITH PROPOFOL N/A 10/21/2016   Procedure: COLONOSCOPY WITH PROPOFOL;  Surgeon: Kieth Brightly, MD;  Location: [facility] ENDOSCOPY;  Service: Endoscopy;  Laterality: N/A;   ESOPHAGOGASTRODUODENOSCOPY (EGD) WITH PROPOFOL N/A 02/07/2020   Procedure: ESOPHAGOGASTRODUODENOSCOPY (EGD) WITH PROPOFOL;  Surgeon: Toney Reil, MD;  Location: [facility] ENDOSCOPY;  Service: Gastroenterology;  Laterality: N/A;    Allergies: Allergies as of 05/18/2023 - Review Complete 05/14/2023  Allergen Reaction Noted   Atorvastatin  07/26/2015   Prednisone Swelling 01/18/2023   Terbinafine and related Dermatitis 01/23/2016   Zetia [ezetimibe] Other (See Comments) 07/10/2022    Medications:  Current Outpatient Medications:    ascorbic acid (VITAMIN C) 500 MG tablet, Take 500 mg by mouth daily., Disp: , Rfl:    aspirin 81 MG tablet, Take 81 mg by mouth daily. Reported on 10/10/2015, Disp: , Rfl:    cholecalciferol (VITAMIN D) 1000 units tablet, Take 1,000 Units by mouth daily., Disp: , Rfl:    diltiazem (CARDIZEM CD) 180 MG 24 hr capsule, Take 1 capsule (180 mg total) by mouth daily., Disp: 90 capsule, Rfl: 1   latanoprost (XALATAN) 0.005 % ophthalmic solution, PLACE 1 DROP IN BOTH EYES AT BEDTIME, Disp: , Rfl: 4  magnesium oxide (MAG-OX) 400 (240 Mg) MG tablet, Take 400 mg by mouth daily., Disp: , Rfl:    olmesartan (BENICAR) 40 MG tablet, Take 1 tablet (40 mg total) by mouth daily., Disp: 90 tablet, Rfl: 1   Omega-3 Fatty Acids (FISH OIL CONCENTRATE PO), Take by mouth. Reported on 10/10/2015, Disp: , Rfl:    omeprazole (PRILOSEC) 20 MG capsule, Take 1 capsule (20 mg total) by mouth 2 (two) times daily before a meal., Disp: , Rfl:     rosuvastatin (CRESTOR) 10 MG tablet, Take 1 tablet (10 mg total) by mouth daily., Disp: 90 tablet, Rfl: 3  Social History: Social History   Tobacco Use   Smoking status: Never   Smokeless tobacco: Never   Tobacco comments:    experimented with dip snuff as a child  Vaping Use   Vaping status: Never Used  Substance Use Topics   Alcohol use: No   Drug use: No    Family Medical History: Family History  Problem Relation Age of Onset   Pulmonary embolism Mother    Hypertension Father    Aortic stenosis Father    Breast cancer Neg Hx     Physical Examination: Vitals:   05/18/23 1549  BP: 134/84    General: Patient is well developed, well nourished, calm, collected, and in no apparent distress. Attention to examination is appropriate.  Neck:   Supple.  Full range of motion.  Respiratory: Patient is breathing without any difficulty.   NEUROLOGICAL:     Awake, alert, oriented to person, place, and time.  Speech is clear and fluent.   Cranial Nerves: Pupils equal round and reactive to light.  Facial tone is symmetric.  Facial sensation is symmetric. Shoulder shrug is symmetric. Tongue protrusion is midline.  There is no pronator drift.  ROM of spine: full.    Strength: Side Biceps Triceps Deltoid Interossei Grip Wrist Ext. Wrist Flex.  R 5 5 5 5 5 5 5   L 5 5 5 5 5 5 5    Side Iliopsoas Quads Hamstring PF DF EHL  R 5 5 5 5 5 5   L 4 4 5 5 5 5    Reflexes are 1+ and symmetric at the biceps, triceps, brachioradialis, patella and achilles.   Hoffman's is absent.   Bilateral upper and lower extremity sensation is intact to light touch.    No evidence of dysmetria noted.  Gait is abnormal.  She is using a rollator..     Medical Decision Making  Imaging: MRV Head 08/09/2022 IMPRESSION: 1. No evidence of intracranial venous thrombosis. 2. No specific cause of pulsatile tinnitus is identified. 3. 2.3 x 1.4 x 2.4 cm right parafalcine meningioma, slightly increased in  size from the prior brain MRI of 04/22/2021. As before, there is local mass effect upon the underlying right frontal lobe with mild underlying parenchymal edema.     Electronically Signed   By: Jackey Loge D.O.   On: 08/11/2022 08:57  MRI Brain 05/10/2023 IMPRESSION: 1. 2.2 x 2.4 cm right parafalcine meningioma, unchanged in size from the brain MRI of 12/04/2022. Mild-to-moderate vasogenic edema within the adjacent right frontal and right parietal lobes, similar to the head CT of 04/24/2023 but increased since the MRI in July. 2. Mild chronic small vessel ischemic changes within the cerebral white matter.     Electronically Signed   By: Jackey Loge D.O.   On: 05/10/2023 20:00  I have personally reviewed the images and agree with the above interpretation.  Assessment and Plan: [name] is a pleasant 77 y.o. female with likely meningioma with increase in edema on her most recent imaging.  We would normally treat this with steroids, but she had a severe issue with steroid-induced myopathy in July.  As such, I would recommend that she continue with physical therapy for now.  We will repeat her imaging in approximately 3 months and then we discussed whether a low-dose of steroids may help.  We discussed her low back pain for a brief period of time.  We discussed referral to consider injections, but she declined at this point.     I spent a total of 10 minutes in this patient's care today. This time was spent reviewing pertinent records including imaging studies, obtaining and confirming history, performing a directed evaluation, formulating and discussing my recommendations, and documenting the visit within the medical record.      Thank you for involving me in the care of this patient.      Dwayna Kentner K. Myer Haff MD, [facility] Neurosurgery

## 2023-05-19 ENCOUNTER — Encounter: Payer: Self-pay | Admitting: Neurosurgery

## 2023-05-20 DIAGNOSIS — M4692 Unspecified inflammatory spondylopathy, cervical region: Secondary | ICD-10-CM | POA: Diagnosis not present

## 2023-05-20 DIAGNOSIS — E440 Moderate protein-calorie malnutrition: Secondary | ICD-10-CM | POA: Diagnosis not present

## 2023-05-20 DIAGNOSIS — M8580 Other specified disorders of bone density and structure, unspecified site: Secondary | ICD-10-CM | POA: Diagnosis not present

## 2023-05-20 DIAGNOSIS — E0965 Drug or chemical induced diabetes mellitus with hyperglycemia: Secondary | ICD-10-CM | POA: Diagnosis not present

## 2023-05-20 DIAGNOSIS — T380X5S Adverse effect of glucocorticoids and synthetic analogues, sequela: Secondary | ICD-10-CM | POA: Diagnosis not present

## 2023-05-20 DIAGNOSIS — I10 Essential (primary) hypertension: Secondary | ICD-10-CM | POA: Diagnosis not present

## 2023-05-20 DIAGNOSIS — D329 Benign neoplasm of meninges, unspecified: Secondary | ICD-10-CM | POA: Diagnosis not present

## 2023-05-20 DIAGNOSIS — M810 Age-related osteoporosis without current pathological fracture: Secondary | ICD-10-CM | POA: Diagnosis not present

## 2023-05-20 DIAGNOSIS — I679 Cerebrovascular disease, unspecified: Secondary | ICD-10-CM | POA: Diagnosis not present

## 2023-05-27 DIAGNOSIS — I679 Cerebrovascular disease, unspecified: Secondary | ICD-10-CM | POA: Diagnosis not present

## 2023-05-27 DIAGNOSIS — M4692 Unspecified inflammatory spondylopathy, cervical region: Secondary | ICD-10-CM | POA: Diagnosis not present

## 2023-05-27 DIAGNOSIS — I10 Essential (primary) hypertension: Secondary | ICD-10-CM | POA: Diagnosis not present

## 2023-05-27 DIAGNOSIS — E0965 Drug or chemical induced diabetes mellitus with hyperglycemia: Secondary | ICD-10-CM | POA: Diagnosis not present

## 2023-05-27 DIAGNOSIS — D329 Benign neoplasm of meninges, unspecified: Secondary | ICD-10-CM | POA: Diagnosis not present

## 2023-05-27 DIAGNOSIS — M810 Age-related osteoporosis without current pathological fracture: Secondary | ICD-10-CM | POA: Diagnosis not present

## 2023-05-27 DIAGNOSIS — T380X5S Adverse effect of glucocorticoids and synthetic analogues, sequela: Secondary | ICD-10-CM | POA: Diagnosis not present

## 2023-05-27 DIAGNOSIS — M8580 Other specified disorders of bone density and structure, unspecified site: Secondary | ICD-10-CM | POA: Diagnosis not present

## 2023-05-27 DIAGNOSIS — E440 Moderate protein-calorie malnutrition: Secondary | ICD-10-CM | POA: Diagnosis not present

## 2023-05-30 ENCOUNTER — Other Ambulatory Visit: Payer: Self-pay | Admitting: Family Medicine

## 2023-05-30 DIAGNOSIS — I10 Essential (primary) hypertension: Secondary | ICD-10-CM

## 2023-06-01 ENCOUNTER — Other Ambulatory Visit: Payer: Self-pay | Admitting: Family Medicine

## 2023-06-01 DIAGNOSIS — M4692 Unspecified inflammatory spondylopathy, cervical region: Secondary | ICD-10-CM | POA: Diagnosis not present

## 2023-06-01 DIAGNOSIS — I679 Cerebrovascular disease, unspecified: Secondary | ICD-10-CM | POA: Diagnosis not present

## 2023-06-01 DIAGNOSIS — T380X5S Adverse effect of glucocorticoids and synthetic analogues, sequela: Secondary | ICD-10-CM | POA: Diagnosis not present

## 2023-06-01 DIAGNOSIS — I10 Essential (primary) hypertension: Secondary | ICD-10-CM | POA: Diagnosis not present

## 2023-06-01 DIAGNOSIS — D329 Benign neoplasm of meninges, unspecified: Secondary | ICD-10-CM | POA: Diagnosis not present

## 2023-06-01 DIAGNOSIS — E0965 Drug or chemical induced diabetes mellitus with hyperglycemia: Secondary | ICD-10-CM | POA: Diagnosis not present

## 2023-06-01 DIAGNOSIS — K219 Gastro-esophageal reflux disease without esophagitis: Secondary | ICD-10-CM

## 2023-06-01 DIAGNOSIS — M8580 Other specified disorders of bone density and structure, unspecified site: Secondary | ICD-10-CM | POA: Diagnosis not present

## 2023-06-01 DIAGNOSIS — E440 Moderate protein-calorie malnutrition: Secondary | ICD-10-CM | POA: Diagnosis not present

## 2023-06-01 DIAGNOSIS — M810 Age-related osteoporosis without current pathological fracture: Secondary | ICD-10-CM | POA: Diagnosis not present

## 2023-06-08 DIAGNOSIS — I10 Essential (primary) hypertension: Secondary | ICD-10-CM | POA: Diagnosis not present

## 2023-06-08 DIAGNOSIS — I679 Cerebrovascular disease, unspecified: Secondary | ICD-10-CM | POA: Diagnosis not present

## 2023-06-08 DIAGNOSIS — M8580 Other specified disorders of bone density and structure, unspecified site: Secondary | ICD-10-CM | POA: Diagnosis not present

## 2023-06-08 DIAGNOSIS — E440 Moderate protein-calorie malnutrition: Secondary | ICD-10-CM | POA: Diagnosis not present

## 2023-06-08 DIAGNOSIS — D329 Benign neoplasm of meninges, unspecified: Secondary | ICD-10-CM | POA: Diagnosis not present

## 2023-06-08 DIAGNOSIS — E0965 Drug or chemical induced diabetes mellitus with hyperglycemia: Secondary | ICD-10-CM | POA: Diagnosis not present

## 2023-06-08 DIAGNOSIS — M810 Age-related osteoporosis without current pathological fracture: Secondary | ICD-10-CM | POA: Diagnosis not present

## 2023-06-08 DIAGNOSIS — T380X5S Adverse effect of glucocorticoids and synthetic analogues, sequela: Secondary | ICD-10-CM | POA: Diagnosis not present

## 2023-06-08 DIAGNOSIS — M4692 Unspecified inflammatory spondylopathy, cervical region: Secondary | ICD-10-CM | POA: Diagnosis not present

## 2023-06-14 DIAGNOSIS — I679 Cerebrovascular disease, unspecified: Secondary | ICD-10-CM | POA: Diagnosis not present

## 2023-06-14 DIAGNOSIS — I10 Essential (primary) hypertension: Secondary | ICD-10-CM | POA: Diagnosis not present

## 2023-06-14 DIAGNOSIS — M810 Age-related osteoporosis without current pathological fracture: Secondary | ICD-10-CM | POA: Diagnosis not present

## 2023-06-14 DIAGNOSIS — M4692 Unspecified inflammatory spondylopathy, cervical region: Secondary | ICD-10-CM | POA: Diagnosis not present

## 2023-06-14 DIAGNOSIS — M8580 Other specified disorders of bone density and structure, unspecified site: Secondary | ICD-10-CM | POA: Diagnosis not present

## 2023-06-14 DIAGNOSIS — E440 Moderate protein-calorie malnutrition: Secondary | ICD-10-CM | POA: Diagnosis not present

## 2023-06-14 DIAGNOSIS — D329 Benign neoplasm of meninges, unspecified: Secondary | ICD-10-CM | POA: Diagnosis not present

## 2023-06-14 DIAGNOSIS — T380X5S Adverse effect of glucocorticoids and synthetic analogues, sequela: Secondary | ICD-10-CM | POA: Diagnosis not present

## 2023-06-14 DIAGNOSIS — E0965 Drug or chemical induced diabetes mellitus with hyperglycemia: Secondary | ICD-10-CM | POA: Diagnosis not present

## 2023-06-15 DIAGNOSIS — I679 Cerebrovascular disease, unspecified: Secondary | ICD-10-CM | POA: Diagnosis not present

## 2023-06-15 DIAGNOSIS — G4733 Obstructive sleep apnea (adult) (pediatric): Secondary | ICD-10-CM

## 2023-06-15 DIAGNOSIS — E785 Hyperlipidemia, unspecified: Secondary | ICD-10-CM

## 2023-06-15 DIAGNOSIS — K219 Gastro-esophageal reflux disease without esophagitis: Secondary | ICD-10-CM

## 2023-06-15 DIAGNOSIS — D329 Benign neoplasm of meninges, unspecified: Secondary | ICD-10-CM | POA: Diagnosis not present

## 2023-06-15 DIAGNOSIS — I700 Atherosclerosis of aorta: Secondary | ICD-10-CM | POA: Diagnosis not present

## 2023-06-15 DIAGNOSIS — M810 Age-related osteoporosis without current pathological fracture: Secondary | ICD-10-CM | POA: Diagnosis not present

## 2023-06-15 DIAGNOSIS — I10 Essential (primary) hypertension: Secondary | ICD-10-CM | POA: Diagnosis not present

## 2023-06-15 DIAGNOSIS — E0965 Drug or chemical induced diabetes mellitus with hyperglycemia: Secondary | ICD-10-CM | POA: Diagnosis not present

## 2023-06-15 DIAGNOSIS — M8580 Other specified disorders of bone density and structure, unspecified site: Secondary | ICD-10-CM | POA: Diagnosis not present

## 2023-06-15 DIAGNOSIS — E440 Moderate protein-calorie malnutrition: Secondary | ICD-10-CM | POA: Diagnosis not present

## 2023-06-15 DIAGNOSIS — M4692 Unspecified inflammatory spondylopathy, cervical region: Secondary | ICD-10-CM | POA: Diagnosis not present

## 2023-06-16 ENCOUNTER — Ambulatory Visit: Payer: Medicare PPO | Admitting: Neurosurgery

## 2023-06-17 ENCOUNTER — Ambulatory Visit: Payer: Medicare PPO | Admitting: Neurosurgery

## 2023-06-22 DIAGNOSIS — M4692 Unspecified inflammatory spondylopathy, cervical region: Secondary | ICD-10-CM | POA: Diagnosis not present

## 2023-06-22 DIAGNOSIS — E440 Moderate protein-calorie malnutrition: Secondary | ICD-10-CM | POA: Diagnosis not present

## 2023-06-22 DIAGNOSIS — T380X5S Adverse effect of glucocorticoids and synthetic analogues, sequela: Secondary | ICD-10-CM | POA: Diagnosis not present

## 2023-06-22 DIAGNOSIS — M810 Age-related osteoporosis without current pathological fracture: Secondary | ICD-10-CM | POA: Diagnosis not present

## 2023-06-22 DIAGNOSIS — M8580 Other specified disorders of bone density and structure, unspecified site: Secondary | ICD-10-CM | POA: Diagnosis not present

## 2023-06-22 DIAGNOSIS — D329 Benign neoplasm of meninges, unspecified: Secondary | ICD-10-CM | POA: Diagnosis not present

## 2023-06-22 DIAGNOSIS — E0965 Drug or chemical induced diabetes mellitus with hyperglycemia: Secondary | ICD-10-CM | POA: Diagnosis not present

## 2023-06-22 DIAGNOSIS — I10 Essential (primary) hypertension: Secondary | ICD-10-CM | POA: Diagnosis not present

## 2023-06-22 DIAGNOSIS — I679 Cerebrovascular disease, unspecified: Secondary | ICD-10-CM | POA: Diagnosis not present

## 2023-06-29 DIAGNOSIS — I10 Essential (primary) hypertension: Secondary | ICD-10-CM | POA: Diagnosis not present

## 2023-06-29 DIAGNOSIS — E440 Moderate protein-calorie malnutrition: Secondary | ICD-10-CM | POA: Diagnosis not present

## 2023-06-29 DIAGNOSIS — M8580 Other specified disorders of bone density and structure, unspecified site: Secondary | ICD-10-CM | POA: Diagnosis not present

## 2023-06-29 DIAGNOSIS — M4692 Unspecified inflammatory spondylopathy, cervical region: Secondary | ICD-10-CM | POA: Diagnosis not present

## 2023-06-29 DIAGNOSIS — I679 Cerebrovascular disease, unspecified: Secondary | ICD-10-CM | POA: Diagnosis not present

## 2023-06-29 DIAGNOSIS — E0965 Drug or chemical induced diabetes mellitus with hyperglycemia: Secondary | ICD-10-CM | POA: Diagnosis not present

## 2023-06-29 DIAGNOSIS — D329 Benign neoplasm of meninges, unspecified: Secondary | ICD-10-CM | POA: Diagnosis not present

## 2023-06-29 DIAGNOSIS — M810 Age-related osteoporosis without current pathological fracture: Secondary | ICD-10-CM | POA: Diagnosis not present

## 2023-06-29 DIAGNOSIS — T380X5S Adverse effect of glucocorticoids and synthetic analogues, sequela: Secondary | ICD-10-CM | POA: Diagnosis not present

## 2023-06-30 DIAGNOSIS — G4733 Obstructive sleep apnea (adult) (pediatric): Secondary | ICD-10-CM | POA: Diagnosis not present

## 2023-07-06 DIAGNOSIS — M8580 Other specified disorders of bone density and structure, unspecified site: Secondary | ICD-10-CM | POA: Diagnosis not present

## 2023-07-06 DIAGNOSIS — M810 Age-related osteoporosis without current pathological fracture: Secondary | ICD-10-CM | POA: Diagnosis not present

## 2023-07-06 DIAGNOSIS — I679 Cerebrovascular disease, unspecified: Secondary | ICD-10-CM | POA: Diagnosis not present

## 2023-07-06 DIAGNOSIS — M4692 Unspecified inflammatory spondylopathy, cervical region: Secondary | ICD-10-CM | POA: Diagnosis not present

## 2023-07-06 DIAGNOSIS — I10 Essential (primary) hypertension: Secondary | ICD-10-CM | POA: Diagnosis not present

## 2023-07-06 DIAGNOSIS — E440 Moderate protein-calorie malnutrition: Secondary | ICD-10-CM | POA: Diagnosis not present

## 2023-07-06 DIAGNOSIS — D329 Benign neoplasm of meninges, unspecified: Secondary | ICD-10-CM | POA: Diagnosis not present

## 2023-07-06 DIAGNOSIS — T380X5S Adverse effect of glucocorticoids and synthetic analogues, sequela: Secondary | ICD-10-CM | POA: Diagnosis not present

## 2023-07-06 DIAGNOSIS — E0965 Drug or chemical induced diabetes mellitus with hyperglycemia: Secondary | ICD-10-CM | POA: Diagnosis not present

## 2023-07-13 DIAGNOSIS — I10 Essential (primary) hypertension: Secondary | ICD-10-CM | POA: Diagnosis not present

## 2023-07-13 DIAGNOSIS — E0965 Drug or chemical induced diabetes mellitus with hyperglycemia: Secondary | ICD-10-CM | POA: Diagnosis not present

## 2023-07-13 DIAGNOSIS — M8580 Other specified disorders of bone density and structure, unspecified site: Secondary | ICD-10-CM | POA: Diagnosis not present

## 2023-07-13 DIAGNOSIS — D329 Benign neoplasm of meninges, unspecified: Secondary | ICD-10-CM | POA: Diagnosis not present

## 2023-07-13 DIAGNOSIS — E440 Moderate protein-calorie malnutrition: Secondary | ICD-10-CM | POA: Diagnosis not present

## 2023-07-13 DIAGNOSIS — T380X5S Adverse effect of glucocorticoids and synthetic analogues, sequela: Secondary | ICD-10-CM | POA: Diagnosis not present

## 2023-07-13 DIAGNOSIS — M810 Age-related osteoporosis without current pathological fracture: Secondary | ICD-10-CM | POA: Diagnosis not present

## 2023-07-13 DIAGNOSIS — I679 Cerebrovascular disease, unspecified: Secondary | ICD-10-CM | POA: Diagnosis not present

## 2023-07-13 DIAGNOSIS — M4692 Unspecified inflammatory spondylopathy, cervical region: Secondary | ICD-10-CM | POA: Diagnosis not present

## 2023-07-26 DIAGNOSIS — H40153 Residual stage of open-angle glaucoma, bilateral: Secondary | ICD-10-CM | POA: Diagnosis not present

## 2023-08-08 ENCOUNTER — Other Ambulatory Visit: Payer: Self-pay

## 2023-08-08 ENCOUNTER — Emergency Department
Admission: EM | Admit: 2023-08-08 | Discharge: 2023-08-08 | Disposition: A | Discharge status: Home / Self Care | Attending: Emergency Medicine | Admitting: Emergency Medicine

## 2023-08-08 ENCOUNTER — Encounter: Payer: Self-pay | Admitting: Emergency Medicine

## 2023-08-08 DIAGNOSIS — I10 Essential (primary) hypertension: Secondary | ICD-10-CM | POA: Insufficient documentation

## 2023-08-08 LAB — BASIC METABOLIC PANEL
Anion gap: 9 (ref 5–15)
BUN: 23 mg/dL (ref 8–23)
CO2: 25 mmol/L (ref 22–32)
Calcium: 9.5 mg/dL (ref 8.9–10.3)
Chloride: 106 mmol/L (ref 98–111)
Creatinine, Ser: 0.9 mg/dL (ref 0.44–1.00)
GFR, Estimated: >60 mL/min (ref >60)
Glucose, Bld: 106 mg/dL — ABNORMAL HIGH (ref 70–99)
Potassium: 4.1 mmol/L (ref 3.5–5.1)
Sodium: 140 mmol/L (ref 135–145)

## 2023-08-08 LAB — CBC WITH DIFFERENTIAL/PLATELET
Abs Immature Granulocytes: 0 10*3/uL (ref 0.00–0.07)
Basophils Absolute: 0.1 10*3/uL (ref 0.0–0.1)
Basophils Relative: 1 %
Eosinophils Absolute: 0.2 10*3/uL (ref 0.0–0.5)
Eosinophils Relative: 3 %
HCT: 43.6 % (ref 36.0–46.0)
Hemoglobin: 14.3 g/dL (ref 12.0–15.0)
Immature Granulocytes: 0 %
Lymphocytes Relative: 58 %
Lymphs Abs: 2.9 10*3/uL (ref 0.7–4.0)
MCH: 28.7 pg (ref 26.0–34.0)
MCHC: 32.8 g/dL (ref 30.0–36.0)
MCV: 87.4 fL (ref 80.0–100.0)
Monocytes Absolute: 0.3 10*3/uL (ref 0.1–1.0)
Monocytes Relative: 7 %
Neutro Abs: 1.6 10*3/uL — ABNORMAL LOW (ref 1.7–7.7)
Neutrophils Relative %: 31 %
Platelets: 230 10*3/uL (ref 150–400)
RBC: 4.99 MIL/uL (ref 3.87–5.11)
RDW: 13.2 % (ref 11.5–15.5)
WBC: 5.1 10*3/uL (ref 4.0–10.5)
nRBC: 0 % (ref 0.0–0.2)

## 2023-08-08 MED ORDER — BENZONATATE 100 MG PO CAPS: 100.0000 mg | ORAL_CAPSULE | Freq: Three times a day (TID) | ORAL | 0 refills | Status: DC | PRN

## 2023-08-08 NOTE — ED Triage Notes (Signed)
 Patient to ED via POV for hypertension. States at home today it was 200/99. States she believes it is because she has been taking cold medicine. States she took an extra BP med approx 30 minutes ago.

## 2023-08-08 NOTE — ED Provider Notes (Signed)
 [facility] Provider Note    Event Date/Time   First MD Initiated Contact with Patient 08/08/23 1826     (approximate)   History   Hypertension   HPI Jonah Florida is a 78 y.o. female with history of HTN presenting today for hypertension.  Patient states she has had recent congestion like symptoms for the past 2 weeks.  She has been taking over-the-counter NyQuil every 4 hours.  Today she noticed her blood pressure was elevated at 200 systolic.  Otherwise denied any additional symptoms like fever, shortness of breath, chest pain, headache, vision changes, lightheadedness.  She states otherwise feeling fine and mostly worried about her elevated blood pressure.     Physical Exam   Triage Vital Signs: ED Triage Vitals  Encounter Vitals Group     BP 08/08/23 1811 (!) 173/82     Systolic BP Percentile --      Diastolic BP Percentile --      Pulse Rate 08/08/23 1811 67     Resp 08/08/23 1811 18     Temp 08/08/23 1811 98.3 F (36.8 C)     Temp Source 08/08/23 1811 Oral     SpO2 08/08/23 1811 99 %     Weight 08/08/23 1809 172 lb (78 kg)     Height 08/08/23 1809 5\' 2"  (1.575 m)     Head Circumference --      Peak Flow --      Pain Score 08/08/23 1809 0     Pain Loc --      Pain Education --      Exclude from Growth Chart --     Most recent vital signs: Vitals:   08/08/23 1811 08/08/23 1830  BP: (!) 173/82 (!) 163/64  Pulse: 67 60  Resp: 18   Temp: 98.3 F (36.8 C)   SpO2: 99% 100%   Physical Exam: I have reviewed the vital signs and nursing notes. General: Awake, alert, no acute distress.  Nontoxic appearing. Head:  Atraumatic, normocephalic.   ENT:  EOM intact, PERRL. Oral mucosa is pink and moist with no lesions. Neck: Neck is supple with full range of motion, No meningeal signs. Cardiovascular:  RRR, No murmurs. Peripheral pulses palpable and equal bilaterally. Respiratory:  Symmetrical chest wall expansion.  No rhonchi, rales,  or wheezes.  Good air movement throughout.  No use of accessory muscles.   Musculoskeletal:  No cyanosis or edema. Moving extremities with full ROM Abdomen:  Soft, nontender, nondistended. Neuro:  GCS 15, moving all four extremities, interacting appropriately. Speech clear. Psych:  Calm, appropriate.   Skin:  Warm, dry, no rash.    ED Results / Procedures / Treatments   Labs (all labs ordered are listed, but only abnormal results are displayed) Labs Reviewed  CBC WITH DIFFERENTIAL/PLATELET - Abnormal; Notable for the following components:      Result Value   Neutro Abs 1.6 (*)    All other components within normal limits  BASIC METABOLIC PANEL - Abnormal; Notable for the following components:   Glucose, Bld 106 (*)    All other components within normal limits     EKG    RADIOLOGY    PROCEDURES:  Critical Care performed: No  Procedures   MEDICATIONS ORDERED IN ED: Medications - No data to display   IMPRESSION / MDM / ASSESSMENT AND PLAN / ED COURSE  I reviewed the triage vital signs and the nursing notes.  Differential diagnosis includes, but is not limited to, viral URI, asymptomatic hypertension  Patient's presentation is most consistent with acute complicated illness / injury requiring diagnostic workup.  Patient is a 78 year old female presenting today for asymptomatic hypertension at home.  Does have recent congestion symptoms but otherwise vital signs stable with no difficulty breathing, hypoxia, or additional lung sounds on auscultation.  Patient states she does not want testing for COVID, flu, or RSV as she feels well and mostly concerned about her blood pressure.  Laboratory workup reassuring.  No evidence of endorgan damage and blood pressure elevation may be secondary to frequent NyQuil usage.  Patient otherwise stable for discharge and recommended checking her blood pressure twice daily and following up with her PCP for  outpatient management.  Will give Tessalon Perles for help with cough.  Patient safe for discharge and given strict return precautions.     FINAL CLINICAL IMPRESSION(S) / ED DIAGNOSES   Final diagnoses:  Uncontrolled hypertension     Rx / DC Orders   ED Discharge Orders          Ordered    benzonatate (TESSALON PERLES) 100 MG capsule  3 times daily PRN        08/08/23 1859             Note:  This document was prepared using Dragon voice recognition software and may include unintentional dictation errors.   Janith Lima, MD 08/08/23 Mikle Bosworth

## 2023-08-08 NOTE — Discharge Instructions (Signed)

## 2023-08-16 ENCOUNTER — Ambulatory Visit
Admission: RE | Admit: 2023-08-16 | Discharge: 2023-08-16 | Disposition: A | Discharge status: Home / Self Care | Payer: Medicare PPO | Source: Ambulatory Visit | Attending: Neurosurgery | Admitting: Neurosurgery

## 2023-08-16 DIAGNOSIS — D329 Benign neoplasm of meninges, unspecified: Secondary | ICD-10-CM

## 2023-08-16 MED ORDER — GADOPICLENOL 0.5 MMOL/ML IV SOLN
10.0000 mL | Freq: Once | INTRAVENOUS | Status: AC | PRN
  Administered 2023-08-16: 8 mL via INTRAVENOUS

## 2023-08-18 ENCOUNTER — Telehealth: Payer: Self-pay | Admitting: Family Medicine

## 2023-08-18 NOTE — Telephone Encounter (Signed)
 Please reach out to pt to sch annual wellness she missed it last yr.   Vickie Stewart

## 2023-08-19 ENCOUNTER — Ambulatory Visit

## 2023-08-19 DIAGNOSIS — Z0000 Encounter for general adult medical examination without abnormal findings: Secondary | ICD-10-CM | POA: Diagnosis not present

## 2023-08-19 NOTE — Patient Instructions (Addendum)
 Vickie Stewart , Thank you for taking time to come for your Medicare Wellness Visit. I appreciate your ongoing commitment to your health goals. Please review the following plan we discussed and let me know if I can assist you in the future.   Referrals/Orders/Follow-Ups/Clinician Recommendations: NONE  This is a list of the screening recommended for you and due dates:  Health Maintenance  Topic Date Due   Yearly kidney health urinalysis for diabetes  Never done   COVID-19 Vaccine (7 - 2024-25 season) 01/31/2023   Mammogram  02/10/2024   Yearly kidney function blood test for diabetes  08/07/2024   Medicare Annual Wellness Visit  08/18/2024   Colon Cancer Screening  10/22/2026   DEXA scan (bone density measurement)  10/31/2026   DTaP/Tdap/Td vaccine (3 - Td or Tdap) 09/07/2031   Pneumonia Vaccine  Completed   Flu Shot  Completed   Hepatitis C Screening  Completed   Zoster (Shingles) Vaccine  Completed   HPV Vaccine  Aged Out    Advanced directives: (ACP Link)Information on Advanced Care Planning can be found at [facility] [place] Advance Health Care Directives Advance Health Care Directives. [email or web address]   Next Medicare Annual Wellness Visit scheduled for next year: Yes   08/24/24@ 1:50 PM BY PHONE

## 2023-08-19 NOTE — Telephone Encounter (Signed)
 I spoke to patient and she scheduled AWV today at 1:50.

## 2023-08-19 NOTE — Progress Notes (Signed)
 Subjective:   Vickie Stewart is a 78 y.o. who presents for a Medicare Wellness preventive visit.  Visit Complete: Virtual I connected with  Vickie Stewart on 08/19/23 by a audio enabled telemedicine application and verified that I am speaking with the correct person using two identifiers.  Patient Location: Home  Provider Location: Office/Clinic  I discussed the limitations of evaluation and management by telemedicine. The patient expressed understanding and agreed to proceed.  Vital Signs: Because this visit was a virtual/telehealth visit, some criteria may be missing or patient reported. Any vitals not documented were not able to be obtained and vitals that have been documented are patient reported.  VideoDeclined- This patient declined Librarian, academic. Therefore the visit was completed with audio only.  Persons Participating in Visit: Patient.  AWV Questionnaire: No: Patient Medicare AWV questionnaire was not completed prior to this visit.  Cardiac Risk Factors include: advanced age (>54men, >61 women);dyslipidemia;hypertension;obesity (BMI >30kg/m2)     Objective:    Today's Vitals   08/19/23 1354  PainSc: 0-No pain   There is no height or weight on file to calculate BMI.     08/19/2023    1:59 PM 08/08/2023    6:11 PM 04/24/2023    8:23 AM 01/19/2023   10:59 AM 12/09/2022    4:22 AM 12/09/2022    3:47 AM 12/08/2022    5:27 PM  Advanced Directives  Does Patient Have a Medical Advance Directive? No No No Yes Yes Yes Yes  Type of Network engineer Power of [employer] Power of [place];Living will  Does patient want to make changes to medical advance directive?     No - Patient declined    Copy of Healthcare Power of Attorney in Chart?    No - copy requested No - copy requested No - copy requested   Would patient like information on creating a  medical advance directive? No - Patient declined    No - Patient declined No - Patient declined     Current Medications (verified) Outpatient Encounter Medications as of 08/19/2023  Medication Sig   ascorbic acid (VITAMIN C) 500 MG tablet Take 500 mg by mouth daily.   aspirin 81 MG tablet Take 81 mg by mouth daily. Reported on 10/10/2015   cholecalciferol (VITAMIN D) 1000 units tablet Take 1,000 Units by mouth daily.   diltiazem (CARDIZEM CD) 180 MG 24 hr capsule TAKE 1 CAPSULE BY MOUTH EVERY DAY   latanoprost (XALATAN) 0.005 % ophthalmic solution PLACE 1 DROP IN BOTH EYES AT BEDTIME   magnesium oxide (MAG-OX) 400 (240 Mg) MG tablet Take 400 mg by mouth daily.   olmesartan (BENICAR) 40 MG tablet Take 1 tablet (40 mg total) by mouth daily.   Omega-3 Fatty Acids (FISH OIL CONCENTRATE PO) Take by mouth. Reported on 10/10/2015   omeprazole (PRILOSEC) 20 MG capsule TAKE 1 CAPSULE (20 MG TOTAL) BY MOUTH IN THE MORNING AND AT BEDTIME.   rosuvastatin (CRESTOR) 10 MG tablet Take 1 tablet (10 mg total) by mouth daily.   benzonatate (TESSALON PERLES) 100 MG capsule Take 1 capsule (100 mg total) by mouth 3 (three) times daily as needed for cough. (Patient not taking: Reported on 08/19/2023)   No facility-administered encounter medications on file as of 08/19/2023.    Allergies (verified) Atorvastatin, Prednisone, Terbinafine and related, and Zetia [ezetimibe]   History: Past Medical History:  Diagnosis Date  Allergic rhinitis, cause unspecified    Anxiety state, unspecified    Contact dermatitis and other eczema, due to unspecified cause    Diffuse cystic mastopathy    Dyspepsia and other specified disorders of function of stomach    Essential hypertension, benign    Glaucoma    Irritable bowel syndrome    Lateral epicondylitis  of elbow    Leukocytopenia, unspecified    Mastodynia    Obesity, unspecified    Pure hypercholesterolemia    Reflux esophagitis    Thoracic or lumbosacral neuritis  or radiculitis, unspecified    Unspecified disorder of skin and subcutaneous tissue    Vaginal cancer (HCC)    History   Past Surgical History:  Procedure Laterality Date   BREAST EXCISIONAL BIOPSY Left    neg   BREAST LUMPECTOMY Left    COLONOSCOPY  08/26/2006   COLONOSCOPY WITH PROPOFOL N/A 10/21/2016   Procedure: COLONOSCOPY WITH PROPOFOL;  Surgeon: Kieth Brightly, MD;  Location: [facility] ENDOSCOPY;  Service: Endoscopy;  Laterality: N/A;   ESOPHAGOGASTRODUODENOSCOPY (EGD) WITH PROPOFOL N/A 02/07/2020   Procedure: ESOPHAGOGASTRODUODENOSCOPY (EGD) WITH PROPOFOL;  Surgeon: Toney Reil, MD;  Location: [facility] ENDOSCOPY;  Service: Gastroenterology;  Laterality: N/A;   Family History  Problem Relation Age of Onset   Pulmonary embolism Mother    Hypertension Father    Aortic stenosis Father    Breast cancer Neg Hx    Social History   Socioeconomic History   Marital status: Widowed    Spouse name: Not on file   Number of children: 0   Years of education: Not on file   Highest education level: Some college, no degree  Occupational History   Occupation: Aeronautical engineer: [employer]  Tobacco Use   Smoking status: Never   Smokeless tobacco: Never   Tobacco comments:    experimented with dip snuff as a child  Vaping Use   Vaping status: Never Used  Substance and Sexual Activity   Alcohol use: No   Drug use: No   Sexual activity: Not Currently    Birth control/protection: Post-menopausal  Other Topics Concern   Not on file  Social History Narrative   Not on file   Social Drivers of Health   Financial Resource Strain: Low Risk  (08/19/2023)   Overall Financial Resource Strain (CARDIA)    Difficulty of Paying Living Expenses: Not hard at all  Food Insecurity: No Food Insecurity (08/19/2023)   Hunger Vital Sign    Worried About Running Out of Food in the Last Year: Never true    Ran Out of Food in the Last Year: Never true  Transportation Needs: No Transportation  Needs (08/19/2023)   PRAPARE - Administrator, Civil Service (Medical): No    Lack of Transportation (Non-Medical): No  Physical Activity: Insufficiently Active (08/19/2023)   Exercise Vital Sign    Days of Exercise per Week: 7 days    Minutes of Exercise per Session: 20 min  Stress: No Stress Concern Present (08/19/2023)   [employer] of Occupational Health - Occupational Stress Questionnaire    Feeling of Stress : Not at all  Social Connections: Moderately Isolated (08/19/2023)   Social Connection and Isolation Panel [NHANES]    Frequency of Communication with Friends and Family: More than three times a week    Frequency of Social Gatherings with Friends and Family: Three times a week    Attends Religious Services: More than 4 times per year  Active Member of Clubs or Organizations: No    Attends Banker Meetings: Never    Marital Status: Widowed    Tobacco Counseling Counseling given: Not Answered Tobacco comments: experimented with dip snuff as a child    Clinical Intake:  Pre-visit preparation completed: Yes  Pain : No/denies pain Pain Score: 0-No pain     BMI - recorded: 30.8 Nutritional Status: BMI > 30  Obese Nutritional Risks: None Diabetes: No  Lab Results  Component Value Date   HGBA1C 6.3 (H) 05/14/2023   HGBA1C 9.0 (H) 12/04/2022   HGBA1C 5.9 (H) 09/28/2022     How often do you need to have someone help you when you read instructions, pamphlets, or other written materials from your doctor or pharmacy?: 1 - Never  Interpreter Needed?: No  Information entered by :: Kennedy Bucker, LPN   Activities of Daily Living     08/19/2023    2:01 PM 02/10/2023   11:31 AM  In your present state of health, do you have any difficulty performing the following activities:  Hearing? 0 0  Vision? 0 0  Difficulty concentrating or making decisions? 0 0  Walking or climbing stairs? 0 1  Dressing or bathing? 0 0  Doing errands, shopping?  0 1  Preparing Food and eating ? N   Using the Toilet? N   In the past six months, have you accidently leaked urine? N   Do you have problems with loss of bowel control? N   Managing your Medications? N   Managing your Finances? N   Housekeeping or managing your Housekeeping? N     Patient Care Team: Alba Cory, MD as PCP - General (Family Medicine) Kieth Brightly, MD (General Surgery) Solum, Marlana Salvage, MD as Physician Assistant (Endocrinology) [facility]  Indicate any recent Medical Services you may have received from other than [facility] providers in the past year (date may be approximate).     Assessment:   This is a routine wellness examination for Vickie Stewart.  Hearing/Vision screen Hearing Screening - Comments:: NO AIDS Vision Screening - Comments:: WEARS GLASSES ALL THE TIME- [email or web address]   Goals Addressed             This Visit's Progress    DIET - EAT MORE FRUITS AND VEGETABLES         Depression Screen     08/19/2023    1:58 PM 05/14/2023   11:01 AM 02/10/2023   11:31 AM 01/18/2023    1:35 PM 12/31/2022   12:53 PM 09/28/2022   10:54 AM 07/10/2022   10:41 AM  PHQ 2/9 Scores  PHQ - 2 Score 0 0 0 0 0 0 0  PHQ- 9 Score 0 0 0   0 0    Fall Risk     08/19/2023    2:00 PM 02/10/2023   11:30 AM 01/18/2023    1:34 PM 12/31/2022   12:52 PM 09/28/2022   10:53 AM  Fall Risk   Falls in the past year? 0 1 1 1  0  Number falls in past yr: 0 1 1 1  0  Injury with Fall? 0 1 1 1  0  Risk for fall due to : No Fall Risks Impaired balance/gait Impaired balance/gait History of fall(s);Impaired balance/gait;Impaired mobility No Fall Risks  Follow up Falls prevention discussed;Falls evaluation completed Falls prevention discussed Falls prevention discussed  Falls prevention discussed    MEDICARE RISK AT HOME:  Medicare Risk at Home Any  stairs in or around the home?: Yes If so, are there any without handrails?: No Home free of loose throw rugs in walkways, pet  beds, electrical cords, etc?: Yes Adequate lighting in your home to reduce risk of falls?: Yes Life alert?: No Use of a cane, walker or w/c?: Yes (USES CANE WHEN OUT OF HOUSE) Grab bars in the bathroom?: Yes Shower chair or bench in shower?: No Elevated toilet seat or a handicapped toilet?: No  TIMED UP AND GO:  Was the test performed?  No  Cognitive Function: 6CIT completed        08/19/2023    2:03 PM 07/14/2018   10:34 AM  6CIT Screen  What Year? 0 points 0 points  What month? 0 points 0 points  What time? 0 points 0 points  Count back from 20 0 points 0 points  Months in reverse 0 points 2 points  Repeat phrase 0 points 2 points  Total Score 0 points 4 points    Immunizations Immunization History  Administered Date(s) Administered   Fluad Quad(high Dose 65+) 02/01/2019, 03/13/2022, 02/10/2023   Influenza Split 07/23/2008, 03/06/2010   Influenza, High Dose Seasonal PF 02/16/2018, 02/24/2020, 03/04/2021   Influenza, Seasonal, Injecte, Preservative Fre 02/26/2011   Influenza,inj,Quad PF,6+ Mos 02/14/2015   Influenza-Unspecified 02/26/2011, 01/11/2016, 02/13/2017   PFIZER Comirnaty(Gray Top)Covid-19 Tri-Sucrose Vaccine 10/15/2020   PFIZER(Purple Top)SARS-COV-2 Vaccination 07/16/2019, 08/12/2019, 03/08/2020   Pfizer Covid-19 Vaccine Bivalent Booster 44yrs & up 03/04/2021   Pfizer(Comirnaty)Fall Seasonal Vaccine 12 years and older 03/13/2022   Pneumococcal Conjugate-13 07/26/2015   Pneumococcal Polysaccharide-23 04/12/2007, 06/19/2013   Tdap 02/26/2011, 09/06/2021   Zoster Recombinant(Shingrix) 07/14/2018, 12/29/2018   Zoster, Live 02/26/2011    Screening Tests Health Maintenance  Topic Date Due   Diabetic kidney evaluation - Urine ACR  Never done   COVID-19 Vaccine (7 - 2024-25 season) 01/31/2023   MAMMOGRAM  02/10/2024   Diabetic kidney evaluation - eGFR measurement  08/07/2024   Medicare Annual Wellness (AWV)  08/18/2024   Colonoscopy  10/22/2026   DEXA SCAN   10/31/2026   DTaP/Tdap/Td (3 - Td or Tdap) 09/07/2031   Pneumonia Vaccine 38+ Years old  Completed   INFLUENZA VACCINE  Completed   Hepatitis C Screening  Completed   Zoster Vaccines- Shingrix  Completed   HPV VACCINES  Aged Out    Health Maintenance  Health Maintenance Due  Topic Date Due   Diabetic kidney evaluation - Urine ACR  Never done   COVID-19 Vaccine (7 - 2024-25 season) 01/31/2023   Health Maintenance Items Addressed: UP TO DATE  Additional Screening:  Vision Screening: Recommended annual ophthalmology exams for early detection of glaucoma and other disorders of the eye.  Dental Screening: Recommended annual dental exams for proper oral hygiene  Community Resource Referral / Chronic Care Management: CRR required this visit?  No   CCM required this visit?  No     Plan:     I have personally reviewed and noted the following in the patient's chart:   Medical and social history Use of alcohol, tobacco or illicit drugs  Current medications and supplements including opioid prescriptions. Patient is not currently taking opioid prescriptions. Functional ability and status Nutritional status Physical activity Advanced directives List of other physicians Hospitalizations, surgeries, and ER visits in previous 12 months Vitals Screenings to include cognitive, depression, and falls Referrals and appointments  In addition, I have reviewed and discussed with patient certain preventive protocols, quality metrics, and best practice recommendations. A written personalized  care plan for preventive services as well as general preventive health recommendations were provided to patient.     Hal Hope, LPN   1/[id number]/[id number]   After Visit Summary: (MyChart) Due to this being a telephonic visit, the after visit summary with patients personalized plan was offered to patient via MyChart   Notes: Nothing significant to report at this time.

## 2023-08-25 ENCOUNTER — Encounter: Payer: Self-pay | Admitting: Intensive Care

## 2023-08-25 ENCOUNTER — Other Ambulatory Visit: Payer: Self-pay | Admitting: Family Medicine

## 2023-08-25 ENCOUNTER — Emergency Department
Admission: EM | Admit: 2023-08-25 | Discharge: 2023-08-25 | Disposition: A | Discharge status: Home / Self Care | Attending: Emergency Medicine | Admitting: Emergency Medicine

## 2023-08-25 ENCOUNTER — Other Ambulatory Visit: Payer: Self-pay

## 2023-08-25 DIAGNOSIS — R531 Weakness: Secondary | ICD-10-CM | POA: Diagnosis not present

## 2023-08-25 DIAGNOSIS — I10 Essential (primary) hypertension: Secondary | ICD-10-CM | POA: Insufficient documentation

## 2023-08-25 DIAGNOSIS — R42 Dizziness and giddiness: Secondary | ICD-10-CM | POA: Diagnosis present

## 2023-08-25 DIAGNOSIS — R519 Headache, unspecified: Secondary | ICD-10-CM | POA: Insufficient documentation

## 2023-08-25 LAB — CBC
HCT: 44.7 % (ref 36.0–46.0)
Hemoglobin: 14.8 g/dL (ref 12.0–15.0)
MCH: 29.5 pg (ref 26.0–34.0)
MCHC: 33.1 g/dL (ref 30.0–36.0)
MCV: 89 fL (ref 80.0–100.0)
Platelets: 237 10*3/uL (ref 150–400)
RBC: 5.02 MIL/uL (ref 3.87–5.11)
RDW: 12.8 % (ref 11.5–15.5)
WBC: 3.8 10*3/uL — ABNORMAL LOW (ref 4.0–10.5)
nRBC: 0 % (ref 0.0–0.2)

## 2023-08-25 LAB — BASIC METABOLIC PANEL
Anion gap: 9 (ref 5–15)
BUN: 16 mg/dL (ref 8–23)
CO2: 25 mmol/L (ref 22–32)
Calcium: 9.4 mg/dL (ref 8.9–10.3)
Chloride: 106 mmol/L (ref 98–111)
Creatinine, Ser: 0.61 mg/dL (ref 0.44–1.00)
GFR, Estimated: >60 mL/min (ref >60)
Glucose, Bld: 98 mg/dL (ref 70–99)
Potassium: 3.6 mmol/L (ref 3.5–5.1)
Sodium: 140 mmol/L (ref 135–145)

## 2023-08-25 NOTE — ED Triage Notes (Signed)
 Patient reports feeling dizziness and "swimmy headed." Also c/o weak legs and pressure across forehead.   Had brain radiation in [id number]

## 2023-08-25 NOTE — ED Provider Notes (Signed)
 [facility] Provider Note    Event Date/Time   First MD Initiated Contact with Patient 08/25/23 [phone]     (approximate)   History   Dizziness and Headache   HPI  Vickie Stewart is a 78 y.o. female who presents to the ED for evaluation of Dizziness and Headache   Review an outpatient MRI brain from 9 days ago with unchanged parafalcine meningioma compared to MRI from 4 months ago. I review a neurosurgery clinic visit from December.  History meningioma, undergoing radiation, refusing surgery.  Intermittent headaches and dizziness.  Lower extremity weakness L > R. Otherwise history of HTN, HLD, GERD, steroid-induced myopathy  Patient presents to the ED for evaluation of 2-4 weeks of worsening chronic intermittent frontal head pressure and dizziness.  She reports primary concern that she may have had a stroke and she had not yet heard the results of MRI from 9 days ago and was concerned that she might need another MRI.  Reports that she would not be able to see Dr. Marcell Barlow "for a while" so she presents to the ED.   Denies any fevers, syncope, focal weakness, persistent dizziness, emesis, falls.  Reports positional dizziness, has trialed meclizine in the past.   Reports cold or sinus infection 4 weeks ago, which has resolved she wonders if this could have contributed.  Reports feeling fine right now and has no complaints or concerns after I go over the results of her recent outpatient MRI with her.  After this she is ready to go home.   Physical Exam   Triage Vital Signs: ED Triage Vitals  Encounter Vitals Group     BP 08/25/23 1335 136/73     Systolic BP Percentile --      Diastolic BP Percentile --      Pulse Rate 08/25/23 1335 61     Resp 08/25/23 1335 17     Temp 08/25/23 1335 98.3 F (36.8 C)     Temp Source 08/25/23 1335 Oral     SpO2 08/25/23 1335 97 %     Weight 08/25/23 1340 172 lb (78 kg)     Height 08/25/23 1340 5\' 2"  (1.575 m)      Head Circumference --      Peak Flow --      Pain Score 08/25/23 1339 1     Pain Loc --      Pain Education --      Exclude from Growth Chart --     Most recent vital signs: Vitals:   08/25/23 1335 08/25/23 1605  BP: 136/73 (!) 144/83  Pulse: 61 (!) 58  Resp: 17   Temp: 98.3 F (36.8 C)   SpO2: 97% 100%    General: Awake, no distress.  CV:  Good peripheral perfusion.  Resp:  Normal effort.  Abd:  No distention.  MSK:  No deformity noted.  Neuro:  No focal deficits appreciated. Cranial nerves II through XII intact 5/5 strength and sensation in all 4 extremities Other:  Bilateral TMs are clear without effusion or infectious features   ED Results / Procedures / Treatments   Labs (all labs ordered are listed, but only abnormal results are displayed) Labs Reviewed  CBC - Abnormal; Notable for the following components:      Result Value   WBC 3.8 (*)    All other components within normal limits  BASIC METABOLIC PANEL  URINALYSIS, ROUTINE W REFLEX MICROSCOPIC    EKG Sinus rhythm with  a rate of 64 bpm.  Normal axis and intervals.  No clear signs of acute ischemia nonspecific changes that appear to be U waves .  Similar to comparison from July of last year  RADIOLOGY   Official radiology report(s): No results found.  PROCEDURES and INTERVENTIONS:  .1-3 Lead EKG Interpretation  Performed by: Delton Prairie, MD Authorized by: Delton Prairie, MD     Interpretation: normal     ECG rate:  60   ECG rate assessment: normal     Rhythm: sinus rhythm     Ectopy: none     Conduction: normal     Medications - No data to display   IMPRESSION / MDM / ASSESSMENT AND PLAN / ED COURSE  I reviewed the triage vital signs and the nursing notes.  Differential diagnosis includes, but is not limited to, ischemic CVA, hemorrhagic conversion, progression of meningioma or associated edema, BPPV,   {Patient presents with symptoms of an acute illness or injury that is potentially  life-threatening.  Patient presents with subacute on chronic intermittent dizziness and headaches with a reassuring exam and suitable for outpatient management.  Reassuringly, she just had an outpatient MRI during these particular symptoms that is reassuring without any acute changes.  She has no concerning features on exam today.  No symptoms at rest, no evidence of focal neurologic deficits.  Reassuring EKG, CBC and metabolic panel without acute changes.  We discussed neurosurgical follow-up, ED return precautions and appropriate dosing of outpatient analgesic medications.      FINAL CLINICAL IMPRESSION(S) / ED DIAGNOSES   Final diagnoses:  Pressure in head  Episode of dizziness     Rx / DC Orders   ED Discharge Orders     None        Note:  This document was prepared using Dragon voice recognition software and may include unintentional dictation errors.   Delton Prairie, MD 08/25/23 [phone]

## 2023-08-25 NOTE — Discharge Instructions (Signed)
 Please take Tylenol and ibuprofen/Advil for your pain.  It is safe to take them together, or to alternate them every few hours.  Take up to 1000mg  of Tylenol at a time, up to 4 times per day.  Do not take more than 4000 mg of Tylenol in 24 hours.  For ibuprofen, take 400 mg (typically 2 tablets), 3 - 4 times per day.

## 2023-08-28 ENCOUNTER — Other Ambulatory Visit: Payer: Self-pay | Admitting: Family Medicine

## 2023-08-28 DIAGNOSIS — I10 Essential (primary) hypertension: Secondary | ICD-10-CM

## 2023-09-09 ENCOUNTER — Ambulatory Visit: Admitting: Neurosurgery

## 2023-09-09 VITALS — BP 134/84 | Ht 62.0 in | Wt 172.0 lb

## 2023-09-09 DIAGNOSIS — D329 Benign neoplasm of meninges, unspecified: Secondary | ICD-10-CM | POA: Diagnosis not present

## 2023-09-09 NOTE — Progress Notes (Signed)
 Referring Physician:  No referring provider defined for this encounter.  Primary Physician:  Alba Cory, MD  History of Present Illness: 09/09/2023 Ms. Trawick has had some improvement in her left leg strength.  She is still doing exercises.  She has had some dizziness.   05/18/2023  Since I last saw her, she has been dealing with some weakness in her left leg.  She underwent radiation treatment for her meningioma.  She is not interested in considering surgery for her intracranial lesion.  She had significant bilateral lower extremity weakness in July that could have been secondary to steroid induced myopathy.  09/22/2022 Ms. Lailanie Halberg is here today with a chief complaint of a known meningioma.  She recently had this imaged which showed an increase in size and change in imaging characteristics.  She has been having some increased headaches.  She reports some intermittent dizziness.    She reports some decreased sensation on the left side of her body.  She denies any weakness.    Past Surgery: denies  Aarna Florida has no symptoms of cervical myelopathy.  The symptoms are causing a significant impact on the patient's life.   I have utilized the care everywhere function in epic to review the outside records available from external health systems.  Review of Systems:  A 10 point review of systems is negative, except for the pertinent positives and negatives detailed in the HPI.  Past Medical History: Past Medical History:  Diagnosis Date   Allergic rhinitis, cause unspecified    Anxiety state, unspecified    Contact dermatitis and other eczema, due to unspecified cause    Diffuse cystic mastopathy    Dyspepsia and other specified disorders of function of stomach    Essential hypertension, benign    Glaucoma    Irritable bowel syndrome    Lateral epicondylitis  of elbow    Leukocytopenia, unspecified    Mastodynia    Obesity, unspecified    Pure  hypercholesterolemia    Reflux esophagitis    Thoracic or lumbosacral neuritis or radiculitis, unspecified    Unspecified disorder of skin and subcutaneous tissue    Vaginal cancer (HCC)    History    Past Surgical History: Past Surgical History:  Procedure Laterality Date   BREAST EXCISIONAL BIOPSY Left    neg   BREAST LUMPECTOMY Left    COLONOSCOPY  08/26/2006   COLONOSCOPY WITH PROPOFOL N/A 10/21/2016   Procedure: COLONOSCOPY WITH PROPOFOL;  Surgeon: Kieth Brightly, MD;  Location: [facility] ENDOSCOPY;  Service: Endoscopy;  Laterality: N/A;   ESOPHAGOGASTRODUODENOSCOPY (EGD) WITH PROPOFOL N/A 02/07/2020   Procedure: ESOPHAGOGASTRODUODENOSCOPY (EGD) WITH PROPOFOL;  Surgeon: Toney Reil, MD;  Location: [facility]COPY;  Service: Gastroenterology;  Laterality: N/A;    Allergies: Allergies as of 09/09/2023 - Review Complete 09/09/2023  Allergen Reaction Noted   Atorvastatin  07/26/2015   Prednisone Swelling 01/18/2023   Terbinafine and related Dermatitis 01/23/2016   Zetia [ezetimibe] Other (See Comments) 07/10/2022    Medications:  Current Outpatient Medications:    ascorbic acid (VITAMIN C) 500 MG tablet, Take 500 mg by mouth daily., Disp: , Rfl:    aspirin 81 MG tablet, Take 81 mg by mouth daily. Reported on 10/10/2015, Disp: , Rfl:    cholecalciferol (VITAMIN D) 1000 units tablet, Take 1,000 Units by mouth daily., Disp: , Rfl:    diltiazem (CARDIZEM CD) 180 MG 24 hr capsule, TAKE 1 CAPSULE BY MOUTH EVERY DAY, Disp: 90 capsule, Rfl: 1  latanoprost (XALATAN) 0.005 % ophthalmic solution, PLACE 1 DROP IN BOTH EYES AT BEDTIME, Disp: , Rfl: 4   magnesium oxide (MAG-OX) 400 (240 Mg) MG tablet, Take 400 mg by mouth daily., Disp: , Rfl:    olmesartan (BENICAR) 40 MG tablet, TAKE 1 TABLET BY MOUTH EVERY DAY, Disp: 30 tablet, Rfl: 0   Omega-3 Fatty Acids (FISH OIL CONCENTRATE PO), Take by mouth. Reported on 10/10/2015, Disp: , Rfl:    omeprazole (PRILOSEC) 20 MG capsule, TAKE 1  CAPSULE (20 MG TOTAL) BY MOUTH IN THE MORNING AND AT BEDTIME., Disp: 180 capsule, Rfl: 1   rosuvastatin (CRESTOR) 10 MG tablet, Take 1 tablet (10 mg total) by mouth daily., Disp: 90 tablet, Rfl: 3  Social History: Social History   Tobacco Use   Smoking status: Never   Smokeless tobacco: Never   Tobacco comments:    experimented with dip snuff as a child  Vaping Use   Vaping status: Never Used  Substance Use Topics   Alcohol use: No   Drug use: No    Family Medical History: Family History  Problem Relation Age of Onset   Pulmonary embolism Mother    Hypertension Father    Aortic stenosis Father    Breast cancer Neg Hx     Physical Examination: Vitals:   09/09/23 1138  BP: 134/84    General: Patient is well developed, well nourished, calm, collected, and in no apparent distress. Attention to examination is appropriate.  Neck:   Supple.  Full range of motion.  Respiratory: Patient is breathing without any difficulty.   NEUROLOGICAL:     Awake, alert, oriented to person, place, and time.  Speech is clear and fluent.   Cranial Nerves: Pupils equal round and reactive to light.  Facial tone is symmetric.  Facial sensation is symmetric. Shoulder shrug is symmetric. Tongue protrusion is midline.  There is no pronator drift.  ROM of spine: full.    Strength: Side Biceps Triceps Deltoid Interossei Grip Wrist Ext. Wrist Flex.  R 5 5 5 5 5 5 5   L 5 5 5 5 5 5 5    Side Iliopsoas Quads Hamstring PF DF EHL  R 5 5 5 5 5 5   L 4+ 4+ 5 5 5 5    Reflexes are 1+ and symmetric at the biceps, triceps, brachioradialis, patella and achilles.   Hoffman's is absent.   Bilateral upper and lower extremity sensation is intact to light touch.    No evidence of dysmetria noted.  Gait is abnormal.  She is using a cane.     Medical Decision Making  Imaging: MRV Head 08/09/2022 IMPRESSION: 1. No evidence of intracranial venous thrombosis. 2. No specific cause of pulsatile tinnitus is  identified. 3. 2.3 x 1.4 x 2.4 cm right parafalcine meningioma, slightly increased in size from the prior brain MRI of 04/22/2021. As before, there is local mass effect upon the underlying right frontal lobe with mild underlying parenchymal edema.     Electronically Signed   By: Jackey Loge D.O.   On: 08/11/2022 08:57  MRI Brain 05/10/2023 IMPRESSION: 1. 2.2 x 2.4 cm right parafalcine meningioma, unchanged in size from the brain MRI of 12/04/2022. Mild-to-moderate vasogenic edema within the adjacent right frontal and right parietal lobes, similar to the head CT of 04/24/2023 but increased since the MRI in July. 2. Mild chronic small vessel ischemic changes within the cerebral white matter.     Electronically Signed   By: Jackey Loge D.O.  On: 05/10/2023 20:00  MRI Brain 08/16/2023 IMPRESSION: 1. 2.2 x 2.4 cm right parafalcine meningioma, unchanged in size as compared to the brain MRI of 05/10/2023. Mass effect upon the adjacent right frontal lobe, and moderate adjacent right frontoparietal parenchymal edema, also unchanged. 2. Mild cerebral white matter chronic small vessel ischemic disease, stable.     Electronically Signed   By: Jackey Loge D.O.   On: 08/16/2023 11:22  I have personally reviewed the images and agree with the above interpretation.  Assessment and Plan: Ms. Pla is a pleasant 78 y.o. female with likely meningioma with stable edema.    Her left leg strength is improving.  Will repeat her MRI scan in 1 year.    I spent a total of 10 minutes in this patient's care today. This time was spent reviewing pertinent records including imaging studies, obtaining and confirming history, performing a directed evaluation, formulating and discussing my recommendations, and documenting the visit within the medical record.      Thank you for involving me in the care of this patient.      Tonantzin Mimnaugh K. Myer Haff MD, [facility]

## 2023-09-19 ENCOUNTER — Other Ambulatory Visit: Payer: Self-pay | Admitting: Family Medicine

## 2023-09-19 DIAGNOSIS — I10 Essential (primary) hypertension: Secondary | ICD-10-CM

## 2023-09-29 ENCOUNTER — Encounter: Payer: Self-pay | Admitting: Family Medicine

## 2023-09-29 ENCOUNTER — Ambulatory Visit (INDEPENDENT_AMBULATORY_CARE_PROVIDER_SITE_OTHER): Payer: Medicare PPO | Admitting: Family Medicine

## 2023-09-29 VITALS — BP 114/76 | HR 66 | Resp 16 | Ht 62.0 in | Wt 175.1 lb

## 2023-09-29 DIAGNOSIS — D329 Benign neoplasm of meninges, unspecified: Secondary | ICD-10-CM

## 2023-09-29 DIAGNOSIS — I700 Atherosclerosis of aorta: Secondary | ICD-10-CM

## 2023-09-29 DIAGNOSIS — M549 Dorsalgia, unspecified: Secondary | ICD-10-CM

## 2023-09-29 DIAGNOSIS — I739 Peripheral vascular disease, unspecified: Secondary | ICD-10-CM

## 2023-09-29 DIAGNOSIS — Z780 Asymptomatic menopausal state: Secondary | ICD-10-CM

## 2023-09-29 DIAGNOSIS — M542 Cervicalgia: Secondary | ICD-10-CM

## 2023-09-29 DIAGNOSIS — Z1231 Encounter for screening mammogram for malignant neoplasm of breast: Secondary | ICD-10-CM

## 2023-09-29 DIAGNOSIS — E519 Thiamine deficiency, unspecified: Secondary | ICD-10-CM

## 2023-09-29 DIAGNOSIS — E099 Drug or chemical induced diabetes mellitus without complications: Secondary | ICD-10-CM | POA: Diagnosis not present

## 2023-09-29 DIAGNOSIS — G8929 Other chronic pain: Secondary | ICD-10-CM

## 2023-09-29 DIAGNOSIS — K219 Gastro-esophageal reflux disease without esophagitis: Secondary | ICD-10-CM

## 2023-09-29 DIAGNOSIS — M8580 Other specified disorders of bone density and structure, unspecified site: Secondary | ICD-10-CM

## 2023-09-29 DIAGNOSIS — I10 Essential (primary) hypertension: Secondary | ICD-10-CM

## 2023-09-29 DIAGNOSIS — G4733 Obstructive sleep apnea (adult) (pediatric): Secondary | ICD-10-CM

## 2023-09-29 LAB — POCT GLYCOSYLATED HEMOGLOBIN (HGB A1C): Hemoglobin A1C: 6.2 % — AB (ref 4.0–5.6)

## 2023-09-29 MED ORDER — OLMESARTAN MEDOXOMIL 40 MG PO TABS: 40.0000 mg | ORAL_TABLET | Freq: Every morning | ORAL | 1 refills | Status: DC

## 2023-09-29 MED ORDER — DILTIAZEM HCL ER COATED BEADS 180 MG PO CP24: 180.0000 mg | ORAL_CAPSULE | Freq: Every evening | ORAL | 1 refills | Status: DC

## 2023-09-29 MED ORDER — OMEPRAZOLE 20 MG PO CPDR: 20.0000 mg | DELAYED_RELEASE_CAPSULE | Freq: Two times a day (BID) | ORAL | 1 refills | Status: DC

## 2023-09-29 NOTE — Progress Notes (Signed)
 Name: Vickie Stewart   MRN: [id number]    DOB: 1946-03-27   Date:09/29/2023       Progress Note  Subjective  Chief Complaint  Chief Complaint  Patient presents with   Medical Management of Chronic Issues   Discussed the use of AI scribe software for clinical note transcription with the patient, who gave verbal consent to proceed.  History of Present Illness Vickie Virginia  Stewart "Sissy" is a 78 year old female who presents for a regular follow-up and prescription review.  She has a history of possible meningioma with associated swelling around the brain, which has been stable. She experiences occasional headaches and dizziness, managed with ibuprofen that was recently recommended by Dr. Jeris Montes ( her neurosurgeon)  Her last MRI showed a right paraphalsing meningioma measuring 1.2 by 2.4 cm, with stable mass effect on the right frontal lobe.  Her diabetes that was induced by corticosteroid use, is well-controlled with an A1c of 6.2, improved from 9.0 during a hospital stay Summer 2024 . She experiences frequent urination and constant hunger, leading to weight gain, and regularly monitors her blood sugar.  She hepatic steatosis, aortic atherosclerosis, diverticulosis, and ground-glass opacities in the lungs, noted during a CT scan in July 2024. She has intermittent right upper quadrant pain but no problems breathing and seen by oncologist that did a PET scan last year and she was given reassurance   She experiences chronic neck and back pain, managed with therapy and occasional massages. She reports muscle spasms and neck pain, attributed to physical activities and yard work.  Her hypertension is managed with olmesartan  40 mg and diltiazem  180 mg, taken daily. She reports variability in her blood pressure readings at home and the average of three bp checks is usually within normal range  She has GERD and takes omeprazole , sometimes twice daily, but reports difficulty obtaining  refills. She experiences occasional heartburn.  She uses a CPAP machine for sleep apnea and reports facial marks from the mask but no morning headaches. She experiences dizziness upon waking, attributed to low blood pressure.  She has a history of osteopenia and is due for a bone density scan. She also reports a history of hemorrhoids, with occasional rectal bleeding, attributed to straining during bowel movements.    Patient Active Problem List   Diagnosis Date Noted   Bilateral leg weakness 12/09/2022   Lower extremity weakness 12/04/2022   Thrombocytopenia (HCC) 12/04/2022   Pre-diabetes 12/04/2022   ASCUS of cervix with negative high risk HPV 01/07/2022   Occipital neuralgia of left side 01/07/2022   Balance problems 01/07/2022   Vertigo 01/07/2022   Pulsatile tinnitus of right ear 06/18/2020   Pulsatile neck mass 06/18/2020   OSA (obstructive sleep apnea) 09/19/2019   Anxiety and depression 09/19/2019   Other neutropenia (HCC) 08/11/2019   Left thyroid  nodule 08/08/2019   Chronic neck and back pain 01/03/2019   Meningioma (HCC) 10/04/2017   Lichen sclerosus 03/24/2016   Vaginal atrophy 03/18/2016   Encounter for follow-up surveillance of vaginal cancer 03/18/2016   Eczema 10/31/2015   Left hand pain 09/10/2015   Osteopenia after menopause 07/26/2015   Menopause 07/26/2015   Irritable colon 07/26/2015   History of cancer of vagina 07/26/2015   Diffuse cystic mastopathy 07/26/2015   Obesity (BMI 30.0-34.9) 07/26/2015   Intermittent low back pain 07/26/2015   Hyperlipemia 07/26/2015   Osteoarthritis 05/13/2015   Allergic rhinitis 05/13/2015   Gastroesophageal reflux disease without esophagitis 05/13/2015   Essential hypertension, benign  Past Surgical History:  Procedure Laterality Date   BREAST EXCISIONAL BIOPSY Left    neg   BREAST LUMPECTOMY Left    COLONOSCOPY  08/26/2006   COLONOSCOPY WITH PROPOFOL  N/A 10/21/2016   Procedure: COLONOSCOPY WITH PROPOFOL ;   Surgeon: Jerlean Mood, MD;  Location: [facility] ENDOSCOPY;  Service: Endoscopy;  Laterality: N/A;   ESOPHAGOGASTRODUODENOSCOPY (EGD) WITH PROPOFOL  N/A 02/07/2020   Procedure: ESOPHAGOGASTRODUODENOSCOPY (EGD) WITH PROPOFOL ;  Surgeon: Selena Daily, MD;  Location: [facility] ENDOSCOPY;  Service: Gastroenterology;  Laterality: N/A;    Family History  Problem Relation Age of Onset   Pulmonary embolism Mother    Hypertension Father    Aortic stenosis Father    Breast cancer Neg Hx     Social History   Tobacco Use   Smoking status: Never   Smokeless tobacco: Never   Tobacco comments:    experimented with dip snuff as a child  Substance Use Topics   Alcohol use: No     Current Outpatient Medications:    ascorbic acid (VITAMIN C) 500 MG tablet, Take 500 mg by mouth daily., Disp: , Rfl:    aspirin  81 MG tablet, Take 81 mg by mouth daily. Reported on 10/10/2015, Disp: , Rfl:    cholecalciferol  (VITAMIN D ) 1000 units tablet, Take 1,000 Units by mouth daily., Disp: , Rfl:    diltiazem  (CARDIZEM  CD) 180 MG 24 hr capsule, TAKE 1 CAPSULE BY MOUTH EVERY DAY, Disp: 90 capsule, Rfl: 1   latanoprost  (XALATAN ) 0.005 % ophthalmic solution, PLACE 1 DROP IN BOTH EYES AT BEDTIME, Disp: , Rfl: 4   magnesium oxide (MAG-OX) 400 (240 Mg) MG tablet, Take 400 mg by mouth daily., Disp: , Rfl:    olmesartan  (BENICAR ) 40 MG tablet, TAKE 1 TABLET BY MOUTH EVERY DAY, Disp: 30 tablet, Rfl: 0   Omega-3 Fatty Acids (FISH OIL CONCENTRATE PO), Take by mouth. Reported on 10/10/2015, Disp: , Rfl:    omeprazole  (PRILOSEC) 20 MG capsule, TAKE 1 CAPSULE (20 MG TOTAL) BY MOUTH IN THE MORNING AND AT BEDTIME., Disp: 180 capsule, Rfl: 1   rosuvastatin  (CRESTOR ) 10 MG tablet, Take 1 tablet (10 mg total) by mouth daily., Disp: 90 tablet, Rfl: 3  Allergies  Allergen Reactions   Atorvastatin     difficulty concentrating and focusing   Prednisone Swelling   Terbinafine And Related Dermatitis    Rash, itch, skin discoloration    Zetia  [Ezetimibe ] Other (See Comments)    Muscle/joint pain    I personally reviewed active problem list, medication list, allergies, family history with the patient/caregiver today.   ROS  Ten systems reviewed and is negative except as mentioned in HPI    Objective Physical Exam Constitutional: Patient appears well-developed and well-nourished.  No distress.  HEENT: head atraumatic, normocephalic, pupils equal and reactive to light,, neck supple Cardiovascular: Normal rate, regular rhythm and normal heart sounds.  No murmur heard. No BLE edema. Pulmonary/Chest: Effort normal and breath sounds normal. No respiratory distress. Abdominal: Soft.  There is no tenderness. Muscular skeletal using a cane , weak on left lower extremity  Psychiatric: Patient has a normal mood and affect. behavior is normal. Judgment and thought content normal.   Vitals:   09/29/23 1042  BP: 114/76  Pulse: 66  Resp: 16  SpO2: 97%  Weight: 175 lb 1.6 oz (79.4 kg)  Height: 5\' 2"  (1.575 m)    Body mass index is 32.03 kg/m.  Recent Results (from the past 2160 hours)  CBC with Differential  Status: Abnormal   Collection Time: 08/08/23  6:12 PM  Result Value Ref Range   WBC 5.1 4.0 - 10.5 K/uL   RBC 4.99 3.87 - 5.11 MIL/uL   Hemoglobin 14.3 12.0 - 15.0 g/dL   HCT 95.6 21.3 - 08.6 %   MCV 87.4 80.0 - 100.0 fL   MCH 28.7 26.0 - 34.0 pg   MCHC 32.8 30.0 - 36.0 g/dL   RDW 57.8 46.9 - 62.9 %   Platelets 230 150 - 400 K/uL   nRBC 0.0 0.0 - 0.2 %   Neutrophils Relative % 31 %   Neutro Abs 1.6 (L) 1.7 - 7.7 K/uL   Lymphocytes Relative 58 %   Lymphs Abs 2.9 0.7 - 4.0 K/uL   Monocytes Relative 7 %   Monocytes Absolute 0.3 0.1 - 1.0 K/uL   Eosinophils Relative 3 %   Eosinophils Absolute 0.2 0.0 - 0.5 K/uL   Basophils Relative 1 %   Basophils Absolute 0.1 0.0 - 0.1 K/uL   Immature Granulocytes 0 %   Abs Immature Granulocytes 0.00 0.00 - 0.07 K/uL    Comment: Performed at [facility],  [address], [place], [place] [address]  Basic metabolic panel     Status: Abnormal   Collection Time: 08/08/23  6:12 PM  Result Value Ref Range   Sodium 140 135 - 145 mmol/L   Potassium 4.1 3.5 - 5.1 mmol/L   Chloride 106 98 - 111 mmol/L   CO2 25 22 - 32 mmol/L   Glucose, Bld 106 (H) 70 - 99 mg/dL    Comment: Glucose reference range applies only to samples taken after fasting for at least 8 hours.   BUN 23 8 - 23 mg/dL   Creatinine, Ser 3.24 0.44 - 1.00 mg/dL   Calcium  9.5 8.9 - 10.3 mg/dL   GFR, Estimated >40 >10 mL/min    Comment: (NOTE) Calculated using the CKD-EPI Creatinine Equation (2021)    Anion gap 9 5 - 15    Comment: Performed at [facility], [address] [place], [place] [address]  Basic metabolic panel     Status: None   Collection Time: 08/25/23  1:35 PM  Result Value Ref Range   Sodium 140 135 - 145 mmol/L   Potassium 3.6 3.5 - 5.1 mmol/L   Chloride 106 98 - 111 mmol/L   CO2 25 22 - 32 mmol/L   Glucose, Bld 98 70 - 99 mg/dL    Comment: Glucose reference range applies only to samples taken after fasting for at least 8 hours.   BUN 16 8 - 23 mg/dL   Creatinine, Ser 6.64 0.44 - 1.00 mg/dL   Calcium  9.4 8.9 - 10.3 mg/dL   GFR, Estimated >40 >34 mL/min    Comment: (NOTE) Calculated using the CKD-EPI Creatinine Equation (2021)    Anion gap 9 5 - 15    Comment: Performed at [facility], [address] [place], [place] [address]  CBC     Status: Abnormal   Collection Time: 08/25/23  1:35 PM  Result Value Ref Range   WBC 3.8 (L) 4.0 - 10.5 K/uL   RBC 5.02 3.87 - 5.11 MIL/uL   Hemoglobin 14.8 12.0 - 15.0 g/dL   HCT 56.3 87.5 - 64.3 %   MCV 89.0 80.0 - 100.0 fL   MCH 29.5 26.0 - 34.0 pg   MCHC 33.1 30.0 - 36.0 g/dL   RDW 32.9 51.8 - 84.1 %   Platelets 237  150 - 400 K/uL   nRBC 0.0 0.0 - 0.2 %    Comment: Performed at [facility], [address] [place], [place] [address]  POCT glycosylated hemoglobin (Hb A1C)      Status: Abnormal   Collection Time: 09/29/23 10:43 AM  Result Value Ref Range   Hemoglobin A1C 6.2 (A) 4.0 - 5.6 %   HbA1c POC (<> result, manual entry)     HbA1c, POC (prediabetic range)     HbA1c, POC (controlled diabetic range)        PHQ2/9:    08/19/2023    1:58 PM 05/14/2023   11:01 AM 02/10/2023   11:31 AM 01/18/2023    1:35 PM 12/31/2022   12:53 PM  Depression screen PHQ 2/9  Decreased Interest 0 0 0 0 0  Down, Depressed, Hopeless 0 0 0 0 0  PHQ - 2 Score 0 0 0 0 0  Altered sleeping 0 0 0    Tired, decreased energy 0 0 0    Change in appetite 0 0 0    Feeling bad or failure about yourself  0 0 0    Trouble concentrating 0 0 0    Moving slowly or fidgety/restless 0 0 0    Suicidal thoughts 0 0 0    PHQ-9 Score 0 0 0    Difficult doing work/chores Not difficult at all Not difficult at all       phq 9 is negative  Fall Risk:    09/29/2023   10:42 AM 08/19/2023    2:00 PM 02/10/2023   11:30 AM 01/18/2023    1:34 PM 12/31/2022   12:52 PM  Fall Risk   Falls in the past year? 1 0 1 1 1   Number falls in past yr: 0 0 1 1 1   Injury with Fall? 0 0 1 1 1   Risk for fall due to : Impaired balance/gait No Fall Risks Impaired balance/gait Impaired balance/gait History of fall(s);Impaired balance/gait;Impaired mobility  Follow up Falls evaluation completed;Education provided;Falls prevention discussed Falls prevention discussed;Falls evaluation completed Falls prevention discussed Falls prevention discussed       Assessment & Plan Diabetes mellitus due to corticosteroids Diabetes well-controlled with A1c improved to 6.2%. Increased hunger and urination not concerning. - Monitor blood glucose levels regularly. - Continue current diabetes management plan.  Hypertension Blood pressure well-controlled at 114/76 mmHg. Variability in home readings noted. - Instruct to take olmesartan  in the morning and diltiazem  at night. - Provide a 90-day supply of olmesartan  and diltiazem  with  refills. - Monitor blood pressure at home and report significant changes.  Moderate hepatic steatosis Moderate hepatic steatosis on CT, likely related to diabetes and corticosteroid use. - Monitor liver function tests periodically.  Aortic atherosclerosis Aortic atherosclerosis well-managed with rosuvastatin . - Continue rosuvastatin  as prescribed.  Chronic small vessel disease of brain Chronic small vessel disease noted on MRI, part of normal aging. Dizziness not attributed to this. - Monitor symptoms and manage as needed.  Meningioma with stable swelling Meningioma stable on MRI. Headaches managed with ibuprofen. Neurology follow-up in one year. - Continue ibuprofen for headache management as needed. - Follow up with neurology as scheduled.  Gastroesophageal reflux disease (GERD) Occasional heartburn reported. Omeprazole  use stretched due to perceived lack of refills. - Provide a 68-month supply of omeprazole . - Instruct to take omeprazole  as prescribed, once or twice daily as needed.  Sleep apnea Regular CPAP use noted. No headaches upon waking, occasional dizziness reported. - Continue using  CPAP machine nightly.  Chronic neck and back pain Chronic neck and back pain exacerbated by activity. Massage therapy beneficial but costly. - Recommend [facility] for affordable massage therapy. - Encourage neck exercises and proper pillow use.  Osteopenia Osteopenia monitored. Bone density scan due. - Schedule bone density scan along with mammogram.  Diverticulosis of colon Diverticulosis noted on previous imaging. No current symptoms. - Monitor for gastrointestinal symptoms.  Hemorrhoids Occasional rectal bleeding likely due to hemorrhoids. Last colonoscopy in 2018 noted hemorrhoids. - Use stool softeners to prevent straining. - Monitor for persistent or recurrent bleeding.  Ground-glass opacities in lungs Ground-glass opacities on previous imaging, during infectious process  at [facility] seen by oncologist and repeat study improved  No respiratory symptoms. - Monitor for respiratory symptoms.

## 2023-09-30 ENCOUNTER — Other Ambulatory Visit: Payer: Self-pay | Admitting: Family Medicine

## 2023-09-30 DIAGNOSIS — I10 Essential (primary) hypertension: Secondary | ICD-10-CM

## 2023-10-04 LAB — COMPREHENSIVE METABOLIC PANEL WITH GFR
AG Ratio: 2.2 (calc) (ref 1.0–2.5)
ALT: 19 U/L (ref 6–29)
AST: 18 U/L (ref 10–35)
Albumin: 4.8 g/dL (ref 3.6–5.1)
Alkaline phosphatase (APISO): 56 U/L (ref 37–153)
BUN: 12 mg/dL (ref 7–25)
CO2: 31 mmol/L (ref 20–32)
Calcium: 10.5 mg/dL — ABNORMAL HIGH (ref 8.6–10.4)
Chloride: 102 mmol/L (ref 98–110)
Creat: 0.67 mg/dL (ref 0.60–1.00)
Globulin: 2.2 g/dL (ref 1.9–3.7)
Glucose, Bld: 83 mg/dL (ref 65–99)
Potassium: 4.3 mmol/L (ref 3.5–5.3)
Sodium: 141 mmol/L (ref 135–146)
Total Bilirubin: 0.5 mg/dL (ref 0.2–1.2)
Total Protein: 7 g/dL (ref 6.1–8.1)
eGFR: 91 mL/min/{1.73_m2} (ref >=60)

## 2023-10-04 LAB — MICROALBUMIN / CREATININE URINE RATIO
Creatinine, Urine: 55 mg/dL (ref 20–275)
Microalb Creat Ratio: 5 mg/g{creat} (ref <30)
Microalb, Ur: 0.3 mg/dL

## 2023-10-04 LAB — LIPID PANEL
Cholesterol: 165 mg/dL (ref <200)
HDL: 56 mg/dL (ref >=50)
LDL Cholesterol (Calc): 85 mg/dL
Non-HDL Cholesterol (Calc): 109 mg/dL (ref <130)
Total CHOL/HDL Ratio: 2.9 (calc) (ref <5.0)
Triglycerides: 141 mg/dL (ref <150)

## 2023-10-04 LAB — VITAMIN B1: Vitamin B1 (Thiamine): 52 nmol/L — ABNORMAL HIGH (ref 8–30)

## 2023-10-05 ENCOUNTER — Other Ambulatory Visit: Payer: Self-pay | Admitting: Family Medicine

## 2023-10-22 ENCOUNTER — Other Ambulatory Visit: Payer: Self-pay | Admitting: Nurse Practitioner

## 2023-10-22 DIAGNOSIS — R600 Localized edema: Secondary | ICD-10-CM

## 2023-10-26 ENCOUNTER — Telehealth: Payer: Self-pay | Admitting: Family Medicine

## 2023-10-26 NOTE — Telephone Encounter (Signed)
 Requested Prescriptions  Refused Prescriptions Disp Refills   furosemide  (LASIX ) 20 MG tablet [Pharmacy Med Name: FUROSEMIDE  20 MG TABLET] 30 tablet 0    Sig: TAKE 1 TABLET BY MOUTH EVERY DAY     Cardiovascular:  Diuretics - Loop Failed - 10/26/2023 11:56 AM      Failed - Ca in normal range and within 180 days    Calcium   Date Value Ref Range Status  09/29/2023 10.5 (H) 8.6 - 10.4 mg/dL Final         Failed - Mg Level in normal range and within 180 days    Magnesium  Date Value Ref Range Status  02/18/2015 1.9 1.6 - 2.3 mg/dL Final         Passed - K in normal range and within 180 days    Potassium  Date Value Ref Range Status  09/29/2023 4.3 3.5 - 5.3 mmol/L Final         Passed - Na in normal range and within 180 days    Sodium  Date Value Ref Range Status  09/29/2023 141 135 - 146 mmol/L Final  05/27/2016 142 134 - 144 mmol/L Final         Passed - Cr in normal range and within 180 days    Creat  Date Value Ref Range Status  09/29/2023 0.67 0.60 - 1.00 mg/dL Final   Creatinine, Urine  Date Value Ref Range Status  09/29/2023 55 20 - 275 mg/dL Final         Passed - Cl in normal range and within 180 days    Chloride  Date Value Ref Range Status  09/29/2023 102 98 - 110 mmol/L Final         Passed - Last BP in normal range    BP Readings from Last 1 Encounters:  09/29/23 114/76         Passed - Valid encounter within last 6 months    Recent Outpatient Visits           3 weeks ago Steroid-induced diabetes mellitus, subsequent encounter [facility])   [facility] Arleen Lacer, MD       Future Appointments             In 3 months Sowles, Krichna, MD [facility], [facility]

## 2023-10-26 NOTE — Telephone Encounter (Signed)
 FYI no answer from pt left detailed vm, she needed to come back to do the lab your oder due to her calcium  being high in April. Pt has not came back yet to do extra lab.

## 2023-10-26 NOTE — Telephone Encounter (Signed)
No answer left detailed vm

## 2023-10-26 NOTE — Telephone Encounter (Signed)
 Calcuim is high and wanted to know if she should stop taking d3

## 2023-10-27 ENCOUNTER — Ambulatory Visit: Payer: Self-pay | Admitting: Family Medicine

## 2023-10-27 LAB — PTH, INTACT AND CALCIUM
Calcium: 10.4 mg/dL (ref 8.6–10.4)
PTH: 37 pg/mL (ref 16–77)

## 2023-11-26 ENCOUNTER — Ambulatory Visit: Payer: Self-pay

## 2023-11-26 NOTE — Telephone Encounter (Signed)
 FYI Only or Action Required?: Action required by provider: medication refill request.  Patient was last seen in primary care on 09/29/2023 by Glenard Mire, MD. Called Nurse Triage reporting Allergic Reaction. Symptoms began several years ago. Interventions attempted: OTC medications: neosporin . Symptoms are: unchanged.  Triage Disposition: See PCP When Office is Open (Within 3 Days)  Patient/caregiver understands and will follow disposition?: No, wishes to speak with PCP  Wants a refill on her triamcinolone  cream         Copied from CRM [id number]. Topic: Clinical - Red Word Triage >> Nov 26, 2023 12:17 PM Turkey B wrote: [facility] that prompted transfer to Nurse Triage: pt has allergic reactions to certain chemicals, and has rash break out on elbows and neck and severe itching Reason for Disposition  Mild widespread rash  (Exception: Heat rash lasting 3 days or less.)  Answer Assessment - Initial Assessment Questions 1. APPEARANCE of RASH: Describe the rash. (e.g., spots, blisters, raised areas, skin peeling, scaly)     Small bumps  2. SIZE: How big are the spots? (e.g., tip of pen, eraser, coin; inches, centimeters)     small 3. LOCATION: Where is the rash located?     Neck and elbows 4. COLOR: What color is the rash? (Note: It is difficult to assess rash color in people with darker-colored skin. When this situation occurs, simply ask the caller to describe what they see.)     normal 5. ONSET: When did the rash begin?     years 6. FEVER: Do you have a fever? If Yes, ask: What is your temperature, how was it measured, and when did it start?     no 7. ITCHING: Does the rash itch? If Yes, ask: How bad is the itch? (Scale 1-10; or mild, moderate, severe)     Mod-severe 8. CAUSE: What do you think is causing the rash?     Reaction to chemicals 9. MEDICINE FACTORS: Have you started any new medicines within the last 2 weeks? (e.g., antibiotics)      no 10.  OTHER SYMPTOMS: Do you have any other symptoms? (e.g., dizziness, headache, sore throat, joint pain)       no  Protocols used: Rash or Redness - [facility]

## 2023-12-02 ENCOUNTER — Ambulatory Visit: Payer: Self-pay

## 2023-12-02 NOTE — Telephone Encounter (Signed)
 FYI Only or Action Required?: Action required by provider: medication refill request.triamcinolone  cream   Patient was last seen in primary care on 09/29/2023 by Glenard Mire, MD. Called Nurse Triage reporting Medication Refill. Symptoms began a week ago. Interventions attempted: OTC medications:  SABRA Symptoms are itchy rash unchanged.  Pt called last week with same issue. Medication was not called in.   Triage Disposition: Call PCP Now  Patient/caregiver understands and will follow disposition?: Yes                    Copied from CRM [id number]. Topic: Clinical - Red Word Triage >> Dec 02, 2023  8:50 AM Larissa RAMAN wrote: [facility] that prompted transfer to Nurse Triage: rash-arms with itching Reason for Disposition  [1] Prescription refill request for ESSENTIAL medicine (i.e., likelihood of harm to patient if not taken) AND [2] triager unable to refill per department policy  Answer Assessment - Initial Assessment Questions 1. DRUG NAME: What medicine do you need to have refilled?     triamcinolone  cream  2. REFILLS REMAINING: How many refills are remaining? (Note: The label on the medicine or pill bottle will show how many refills are remaining. If there are no refills remaining, then a renewal may be needed.)     none 3. EXPIRATION DATE: What is the expiration date? (Note: The label states when the prescription will expire, and thus can no longer be refilled.)     Last prescribed 2021 4. PRESCRIBING HCP: Who prescribed it? Reason: If prescribed by specialist, call should be referred to that group.     Dr. Glenard 5. SYMPTOMS: Do you have any symptoms?     Itchy rash  Protocols used: Medication Refill and Renewal [facility]

## 2023-12-02 NOTE — Telephone Encounter (Signed)
 Not on current med list.

## 2023-12-02 NOTE — Telephone Encounter (Signed)
 Pt has been notified Dr. Glenard is out of the office and since it is not on current med list no other provider will fill without an appt.  Pt also notified even Dr. Glenard may require appt or refuse med refill.

## 2023-12-07 ENCOUNTER — Other Ambulatory Visit: Payer: Self-pay | Admitting: Family Medicine

## 2023-12-07 ENCOUNTER — Telehealth: Payer: Self-pay

## 2023-12-07 MED ORDER — TRIAMCINOLONE ACETONIDE 0.1 % EX CREA: 1.0000 | TOPICAL_CREAM | Freq: Two times a day (BID) | CUTANEOUS | 0 refills | Status: AC

## 2023-12-07 NOTE — Telephone Encounter (Signed)
 Triamcinolone  cream was d/c in July 2024. Pt states she needs it due to her eczema. Please advise

## 2023-12-07 NOTE — Telephone Encounter (Signed)
 Pt.notified

## 2023-12-07 NOTE — Telephone Encounter (Signed)
 Copied from CRM [id number]. Topic: Clinical - Prescription Issue >> Dec 07, 2023  9:19 AM Gustabo D wrote: Patient is requesting a refill- triamcinolone  cream it's not on her medication list. She says she has called about it multiple times and really needs this cream. Please call her back. [phone]

## 2024-01-07 DIAGNOSIS — G4733 Obstructive sleep apnea (adult) (pediatric): Secondary | ICD-10-CM | POA: Diagnosis not present

## 2024-01-28 NOTE — Patient Instructions (Addendum)
 Team Member Role and Specialty Contact Info Address Start End Comments  Maree Jannett POUR, MD Consulting Physician (Neurology) Phone: [phone] Fax: [phone] [address] [facility] [place] [address] 02/01/2024 - -    Preventive Care 65 Years and Older, Female Preventive care refers to lifestyle choices and visits with your health care provider that can promote health and wellness. Preventive care visits are also called wellness exams. What can I expect for my preventive care visit? Counseling Your health care provider may ask you questions about your: Medical history, including: Past medical problems. Family medical history. Pregnancy and menstrual history. History of falls. Current health, including: Memory and ability to understand (cognition). Emotional well-being. Home life and relationship well-being. Sexual activity and sexual health. Lifestyle, including: Alcohol, nicotine or tobacco, and drug use. Access to firearms. Diet, exercise, and sleep habits. Work and work Astronomer. Sunscreen use. Safety issues such as seatbelt and bike helmet use. Physical exam Your health care provider will check your: Height and weight. These may be used to calculate your BMI (body mass index). BMI is a measurement that tells if you are at a healthy weight. Waist circumference. This measures the distance around your waistline. This measurement also tells if you are at a healthy weight and may help predict your risk of certain diseases, such as type 2 diabetes and high blood pressure. Heart rate and blood pressure. Body temperature. Skin for abnormal spots. What immunizations do I need?  Vaccines are usually given at various ages, according to a schedule. Your health care provider will recommend vaccines for you based on your age, medical history, and lifestyle or other factors, such as travel or where you work. What tests do I need? Screening Your  health care provider may recommend screening tests for certain conditions. This may include: Lipid and cholesterol levels. Hepatitis C test. Hepatitis B test. HIV (human immunodeficiency virus) test. STI (sexually transmitted infection) testing, if you are at risk. Lung cancer screening. Colorectal cancer screening. Diabetes screening. This is done by checking your blood sugar (glucose) after you have not eaten for a while (fasting). Mammogram. Talk with your health care provider about how often you should have regular mammograms. BRCA-related cancer screening. This may be done if you have a family history of breast, ovarian, tubal, or peritoneal cancers. Bone density scan. This is done to screen for osteoporosis. Talk with your health care provider about your test results, treatment options, and if necessary, the need for more tests. Follow these instructions at home: Eating and drinking  Eat a diet that includes fresh fruits and vegetables, whole grains, lean protein, and low-fat dairy products. Limit your intake of foods with high amounts of sugar, saturated fats, and salt. Take vitamin and mineral supplements as recommended by your health care provider. Do not drink alcohol if your health care provider tells you not to drink. If you drink alcohol: Limit how much you have to 0-1 drink a day. Know how much alcohol is in your drink. In the [place] one drink equals one 12 oz bottle of beer (355 mL), one 5 oz glass of wine (148 mL), or one 1 oz glass of hard liquor (44 mL). Lifestyle Brush your teeth every morning and night with fluoride toothpaste. Floss one time each day. Exercise for at least 30 minutes 5 or more days each week. Do not use any products that contain nicotine or tobacco. These products include cigarettes, chewing tobacco, and vaping devices, such as e-cigarettes. If you  need help quitting, ask your health care provider. Do not use drugs. If you are sexually active, practice  safe sex. Use a condom or other form of protection in order to prevent STIs. Take aspirin  only as told by your health care provider. Make sure that you understand how much to take and what form to take. Work with your health care provider to find out whether it is safe and beneficial for you to take aspirin  daily. Ask your health care provider if you need to take a cholesterol-lowering medicine (statin). Find healthy ways to manage stress, such as: Meditation, yoga, or listening to music. Journaling. Talking to a trusted person. Spending time with friends and family. Minimize exposure to UV radiation to reduce your risk of skin cancer. Safety Always wear your seat belt while driving or riding in a vehicle. Do not drive: If you have been drinking alcohol. Do not ride with someone who has been drinking. When you are tired or distracted. While texting. If you have been using any mind-altering substances or drugs. Wear a helmet and other protective equipment during sports activities. If you have firearms in your house, make sure you follow all gun safety procedures. What's next? Visit your health care provider once a year for an annual wellness visit. Ask your health care provider how often you should have your eyes and teeth checked. Stay up to date on all vaccines. This information is not intended to replace advice given to you by your health care provider. Make sure you discuss any questions you have with your health care provider. Document Revised: 11/13/2020 Document Reviewed: 11/13/2020 Elsevier Patient Education  2024 [employer].

## 2024-02-01 ENCOUNTER — Ambulatory Visit: Admitting: Family Medicine

## 2024-02-01 ENCOUNTER — Encounter: Payer: Self-pay | Admitting: Family Medicine

## 2024-02-01 VITALS — BP 132/74 | HR 62 | Resp 16 | Ht 60.0 in | Wt 178.0 lb

## 2024-02-01 DIAGNOSIS — Z0000 Encounter for general adult medical examination without abnormal findings: Secondary | ICD-10-CM

## 2024-02-01 DIAGNOSIS — G8929 Other chronic pain: Secondary | ICD-10-CM | POA: Diagnosis not present

## 2024-02-01 DIAGNOSIS — R2689 Other abnormalities of gait and mobility: Secondary | ICD-10-CM | POA: Diagnosis not present

## 2024-02-01 DIAGNOSIS — Z0001 Encounter for general adult medical examination with abnormal findings: Secondary | ICD-10-CM

## 2024-02-01 DIAGNOSIS — D329 Benign neoplasm of meninges, unspecified: Secondary | ICD-10-CM

## 2024-02-01 DIAGNOSIS — M549 Dorsalgia, unspecified: Secondary | ICD-10-CM | POA: Diagnosis not present

## 2024-02-01 DIAGNOSIS — M542 Cervicalgia: Secondary | ICD-10-CM

## 2024-02-01 DIAGNOSIS — R42 Dizziness and giddiness: Secondary | ICD-10-CM

## 2024-02-01 NOTE — Progress Notes (Signed)
 Name: Vickie Stewart   MRN: [id number]    DOB: 1945/10/31   Date:02/01/2024       Progress Note  Subjective  Chief Complaint  Chief Complaint  Patient presents with   Annual Exam    HPI  Patient presents for annual CPE and follow up   Discussed the use of AI scribe software for clinical note transcription with the patient, who gave verbal consent to proceed.  History of Present Illness Vickie Stewart is a 78 year old female who presents for an annual physical exam and evaluation of back pain and gait instability.  She experiences chronic neck and back pain, which worsens after sitting for more than thirty minutes and radiates from her back to her head. She uses a walker due to unsteadiness in her gait and feels pressure on her side after prolonged walking. She has been undergoing vestibular rehabilitation for the past two years, which she performs daily.  She has a history of a parafalcine meningioma measuring 2.2 by 2.4 cm, causing some swelling and slight pressure on the right frontal lobe. She experiences decreased sensation on the left side of her body, headaches, and dizziness, persistent for the last three weeks. She has previously undergone nerve conduction studies and an MRI of the brain.  She has a history of cervical cancer treated with radiation in 2008. Her last Pap smear in April 2023 showed ASCUS. She reports that a specialist told her everything was normal. She has not had any recurrence of cancer since her treatment.  She reports a weight gain from 175 to 178 pounds since her last visit, attributing it to a previous period of poor nutrition and increased intake of sweets. She is attempting to reduce her weight by cutting back on sweets and increasing her intake of fruits and nuts. She is less active due to her balance issues and primarily leaves the house to attend church.  She wears a pad for bladder safety but does not experience urgency or  incontinence. She reports frequent urination, especially after drinking fluids, but can usually make it to the bathroom in time. She has a history of constipation if she does not consume enough fruits and nuts.  She experiences a rash on her arms, which is improving with medication and lotion use. She finds that green alcohol helps more than the prescribed medication, although it dries her skin. She has a history of skin issues but reports improvement.  Her brother, Vickie Stewart, is designated to make medical decisions for her if she is unable to do so. She has a living will in place.    Diet: cooking at home Exercise: unable to do a lot due to in-balance   Last Eye Exam: completed Last Dental Exam: completed  Flowsheet Row Clinical Support from 08/19/2023 in [facility]  AUDIT-C Score 0   Depression: Phq 9 is  negative    02/01/2024   10:53 AM 08/19/2023    1:58 PM 05/14/2023   11:01 AM 02/10/2023   11:31 AM 01/18/2023    1:35 PM  Depression screen PHQ 2/9  Decreased Interest 0 0 0 0 0  Down, Depressed, Hopeless 0 0 0 0 0  PHQ - 2 Score 0 0 0 0 0  Altered sleeping  0 0 0   Tired, decreased energy  0 0 0   Change in appetite  0 0 0   Feeling bad or failure about yourself   0 0 0  Trouble concentrating  0 0 0   Moving slowly or fidgety/restless  0 0 0   Suicidal thoughts  0 0 0   PHQ-9 Score  0 0 0   Difficult doing work/chores  Not difficult at all Not difficult at all     Hypertension: BP Readings from Last 3 Encounters:  02/01/24 132/74  09/29/23 114/76  09/09/23 134/84   Obesity: Wt Readings from Last 3 Encounters:  02/01/24 178 lb (80.7 kg)  09/29/23 175 lb 1.6 oz (79.4 kg)  09/09/23 172 lb (78 kg)   BMI Readings from Last 3 Encounters:  02/01/24 34.76 kg/m  09/29/23 32.03 kg/m  09/09/23 31.46 kg/m     Vaccines: reviewed with the patient.   Hep C Screening: completed STD testing and prevention (HIV/chl/gon/syphilis):  N/A Intimate partner violence: negative screen  Sexual History : not currently  Menstrual History/LMP/Abnormal Bleeding: post menopausal  Discussed importance of follow up if any post-menopausal bleeding: yes  Incontinence Symptoms: no problems   Breast cancer:  - Last Mammogram: already scheduled  - BRCA gene screening: N/A  Osteoporosis Prevention : Discussed high calcium  and vitamin D  supplementation, weight bearing exercises Bone density :scheduled    Cervical cancer screening: up-to-date had ASCUS 2023, seen by gyn - cannot find note, remote history of cervical cancer, treated in 2008   Skin cancer: Discussed monitoring for atypical lesions  Colorectal cancer: up to date   Lung cancer:  Low Dose CT Chest recommended if Age 62-80 years, 20 pack-year currently smoking OR have quit w/in 15years. Patient does not qualify for screen   ECG: 07/2023  Advanced Care Planning: A voluntary discussion about advance care planning including the explanation and discussion of advance directives.  Discussed health care proxy and Living will, and the patient was able to identify a health care proxy as brother - Vickie Stewart .  Patient does have a living will and power of attorney of health care   Patient Active Problem List   Diagnosis Date Noted   Bilateral leg weakness 12/09/2022   Lower extremity weakness 12/04/2022   Thrombocytopenia (HCC) 12/04/2022   Pre-diabetes 12/04/2022   ASCUS of cervix with negative high risk HPV 01/07/2022   Occipital neuralgia of left side 01/07/2022   Balance problems 01/07/2022   Vertigo 01/07/2022   Pulsatile tinnitus of right ear 06/18/2020   Pulsatile neck mass 06/18/2020   OSA (obstructive sleep apnea) 09/19/2019   Anxiety and depression 09/19/2019   Other neutropenia (HCC) 08/11/2019   Left thyroid  nodule 08/08/2019   Chronic neck and back pain 01/03/2019   Meningioma (HCC) 10/04/2017   Lichen sclerosus 03/24/2016   Vaginal atrophy 03/18/2016    Encounter for follow-up surveillance of vaginal cancer 03/18/2016   Eczema 10/31/2015   Left hand pain 09/10/2015   Osteopenia after menopause 07/26/2015   Menopause 07/26/2015   Irritable colon 07/26/2015   History of cancer of vagina 07/26/2015   Diffuse cystic mastopathy 07/26/2015   Obesity (BMI 30.0-34.9) 07/26/2015   Intermittent low back pain 07/26/2015   Hyperlipemia 07/26/2015   Osteoarthritis 05/13/2015   Allergic rhinitis 05/13/2015   Gastroesophageal reflux disease without esophagitis 05/13/2015   Essential hypertension, benign     Past Surgical History:  Procedure Laterality Date   BREAST EXCISIONAL BIOPSY Left    neg   BREAST LUMPECTOMY Left    COLONOSCOPY  08/26/2006   COLONOSCOPY WITH PROPOFOL  N/A 10/21/2016   Procedure: COLONOSCOPY WITH PROPOFOL ;  Surgeon: Dellie Louanne MATSU, MD;  Location: [facility] ENDOSCOPY;  Service: Endoscopy;  Laterality: N/A;   ESOPHAGOGASTRODUODENOSCOPY (EGD) WITH PROPOFOL  N/A 02/07/2020   Procedure: ESOPHAGOGASTRODUODENOSCOPY (EGD) WITH PROPOFOL ;  Surgeon: Unk Corinn Skiff, MD;  Location: [facility] ENDOSCOPY;  Service: Gastroenterology;  Laterality: N/A;    Family History  Problem Relation Age of Onset   Pulmonary embolism Mother    Hypertension Father    Aortic stenosis Father    Breast cancer Neg Hx     Social History   Socioeconomic History   Marital status: Widowed    Spouse name: Not on file   Number of children: 0   Years of education: Not on file   Highest education level: Some college, no degree  Occupational History   Occupation: Aeronautical engineer: [employer]  Tobacco Use   Smoking status: Never   Smokeless tobacco: Never   Tobacco comments:    experimented with dip snuff as a child  Vaping Use   Vaping status: Never Used  Substance and Sexual Activity   Alcohol use: No   Drug use: No   Sexual activity: Not Currently    Birth control/protection: Post-menopausal  Other Topics Concern   Not on file  Social  History Narrative   Not on file   Social Drivers of Health   Financial Resource Strain: Low Risk  (08/19/2023)   Overall Financial Resource Strain (CARDIA)    Difficulty of Paying Living Expenses: Not hard at all  Food Insecurity: No Food Insecurity (08/19/2023)   Hunger Vital Sign    Worried About Running Out of Food in the Last Year: Never true    Ran Out of Food in the Last Year: Never true  Transportation Needs: No Transportation Needs (08/19/2023)   PRAPARE - Administrator, Civil Service (Medical): No    Lack of Transportation (Non-Medical): No  Physical Activity: Insufficiently Active (08/19/2023)   Exercise Vital Sign    Days of Exercise per Week: 7 days    Minutes of Exercise per Session: 20 min  Stress: No Stress Concern Present (08/19/2023)   [employer] of Occupational Health - Occupational Stress Questionnaire    Feeling of Stress : Not at all  Social Connections: Moderately Isolated (08/19/2023)   Social Connection and Isolation Panel    Frequency of Communication with Friends and Family: More than three times a week    Frequency of Social Gatherings with Friends and Family: Three times a week    Attends Religious Services: More than 4 times per year    Active Member of Clubs or Organizations: No    Attends Banker Meetings: Never    Marital Status: Widowed  Intimate Partner Violence: Not At Risk (08/19/2023)   Humiliation, Afraid, Rape, and Kick questionnaire    Fear of Current or Ex-Partner: No    Emotionally Abused: No    Physically Abused: No    Sexually Abused: No     Current Outpatient Medications:    ascorbic acid (VITAMIN C) 500 MG tablet, Take 500 mg by mouth daily., Disp: , Rfl:    aspirin  81 MG tablet, Take 81 mg by mouth daily. Reported on 10/10/2015, Disp: , Rfl:    cholecalciferol  (VITAMIN D ) 1000 units tablet, Take 1,000 Units by mouth daily., Disp: , Rfl:    diltiazem  (CARDIZEM  CD) 180 MG 24 hr capsule, Take 1 capsule  (180 mg total) by mouth every evening., Disp: 90 capsule, Rfl: 1   latanoprost  (XALATAN ) 0.005 % ophthalmic solution, PLACE 1 DROP IN BOTH EYES  AT BEDTIME, Disp: , Rfl: 4   magnesium oxide (MAG-OX) 400 (240 Mg) MG tablet, Take 400 mg by mouth daily., Disp: , Rfl:    olmesartan  (BENICAR ) 40 MG tablet, Take 1 tablet (40 mg total) by mouth every morning., Disp: 90 tablet, Rfl: 1   Omega-3 Fatty Acids (FISH OIL CONCENTRATE PO), Take by mouth. Reported on 10/10/2015, Disp: , Rfl:    omeprazole  (PRILOSEC) 20 MG capsule, Take 1 capsule (20 mg total) by mouth 2 (two) times daily before a meal., Disp: 180 capsule, Rfl: 1   rosuvastatin  (CRESTOR ) 10 MG tablet, Take 1 tablet (10 mg total) by mouth daily., Disp: 90 tablet, Rfl: 3   triamcinolone  cream (KENALOG ) 0.1 %, Apply 1 Application topically 2 (two) times daily., Disp: 453.6 g, Rfl: 0  Allergies  Allergen Reactions   Atorvastatin     difficulty concentrating and focusing   Prednisone Swelling   Terbinafine And Related Dermatitis    Rash, itch, skin discoloration   Zetia  [Ezetimibe ] Other (See Comments)    Muscle/joint pain     ROS  Constitutional: Negative for fever or weight change.  Respiratory: Negative for cough and shortness of breath.   Cardiovascular: Negative for chest pain or palpitations.  Gastrointestinal: Negative for abdominal pain, no bowel changes.  Musculoskeletal: positive  for gait problem or  joint swelling.  Skin: positive for pruritus on arms only - rash resolved  Neurological: Negative for dizziness or headache.  No other specific complaints in a complete review of systems (except as listed in HPI above).   Objective  Vitals:   02/01/24 1057  BP: 132/74  Pulse: 62  Resp: 16  SpO2: 98%  Weight: 178 lb (80.7 kg)  Height: 5' (1.524 m)    Body mass index is 34.76 kg/m.  Physical Exam  Constitutional: Patient appears well-developed and well-nourished. No distress.  HENT: Head: Normocephalic and atraumatic.  Ears: B TMs ok, no erythema or effusion; Nose: Nose normal. Mouth/Throat: Oropharynx is clear and moist. No oropharyngeal exudate.  Eyes: Conjunctivae and EOM are normal. Pupils are equal, round, and reactive to light. No scleral icterus.  Neck: Normal range of motion. Neck supple. No JVD present. No thyromegaly present.  Cardiovascular: Normal rate, regular rhythm and normal heart sounds.  No murmur heard. No BLE edema. Pulmonary/Chest: Effort normal and breath sounds normal. No respiratory distress. Abdominal: Soft. Bowel sounds are normal, no distension. There is no tenderness. no masses Breast: no lumps or masses, no nipple discharge or rashes FEMALE GENITALIA:  Not done  RECTAL: not done  Musculoskeletal:slow gait , using a cane Neurological: he is alert and oriented to person, place, and time. No cranial nerve deficit. Coordination, using a cane for balance, negative straight leg raise Skin: Skin is warm and dry. No rash noticed  Psychiatric: Patient has a normal mood and affect. behavior is normal. Judgment and thought content normal.     Assessment & Plan  1. Encounter for Medicare annual wellness exam (Primary)  Needs to work on losing weight now   2. Meningioma (HCC)  Still has some numbness left side   3. Chronic neck and back pain  stable  4. Imbalance  Handicap form filled out   5. Dizziness  Continue vestibular rehab exercises at home     -USPSTF grade A and B recommendations reviewed with patient; age-appropriate recommendations, preventive care, screening tests, etc discussed and encouraged; healthy living encouraged; see AVS for patient education given to patient -Discussed importance of 150 minutes  of physical activity weekly, eat two servings of fish weekly, eat one serving of tree nuts ( cashews, pistachios, pecans, almonds.SABRA) every other day, eat 6 servings of fruit/vegetables daily and drink plenty of water and avoid sweet beverages.   -Reviewed Health  Maintenance: Yes.

## 2024-02-14 ENCOUNTER — Ambulatory Visit: Payer: Self-pay | Admitting: Family Medicine

## 2024-02-14 ENCOUNTER — Ambulatory Visit
Admission: RE | Admit: 2024-02-14 | Discharge: 2024-02-14 | Disposition: A | Discharge status: Home / Self Care | Source: Ambulatory Visit | Attending: Family Medicine | Admitting: Family Medicine

## 2024-02-14 DIAGNOSIS — Z1231 Encounter for screening mammogram for malignant neoplasm of breast: Secondary | ICD-10-CM | POA: Diagnosis not present

## 2024-02-14 DIAGNOSIS — M8589 Other specified disorders of bone density and structure, multiple sites: Secondary | ICD-10-CM | POA: Diagnosis not present

## 2024-02-14 DIAGNOSIS — Z780 Asymptomatic menopausal state: Secondary | ICD-10-CM | POA: Insufficient documentation

## 2024-02-14 DIAGNOSIS — M8580 Other specified disorders of bone density and structure, unspecified site: Secondary | ICD-10-CM

## 2024-02-21 DIAGNOSIS — H1045 Other chronic allergic conjunctivitis: Secondary | ICD-10-CM | POA: Diagnosis not present

## 2024-03-19 ENCOUNTER — Other Ambulatory Visit: Payer: Self-pay | Admitting: Family Medicine

## 2024-03-19 DIAGNOSIS — I10 Essential (primary) hypertension: Secondary | ICD-10-CM

## 2024-03-29 ENCOUNTER — Ambulatory Visit: Admitting: Family Medicine

## 2024-03-29 ENCOUNTER — Other Ambulatory Visit (HOSPITAL_COMMUNITY)
Admission: RE | Admit: 2024-03-29 | Discharge: 2024-03-29 | Disposition: A | Discharge status: Home / Self Care | Source: Ambulatory Visit | Attending: Family Medicine | Admitting: Family Medicine

## 2024-03-29 ENCOUNTER — Encounter: Payer: Self-pay | Admitting: Family Medicine

## 2024-03-29 VITALS — BP 128/70 | HR 63 | Resp 16 | Ht 60.0 in | Wt 177.0 lb

## 2024-03-29 DIAGNOSIS — M8580 Other specified disorders of bone density and structure, unspecified site: Secondary | ICD-10-CM | POA: Diagnosis not present

## 2024-03-29 DIAGNOSIS — G4733 Obstructive sleep apnea (adult) (pediatric): Secondary | ICD-10-CM | POA: Diagnosis not present

## 2024-03-29 DIAGNOSIS — Z8639 Personal history of other endocrine, nutritional and metabolic disease: Secondary | ICD-10-CM | POA: Diagnosis not present

## 2024-03-29 DIAGNOSIS — Z1151 Encounter for screening for human papillomavirus (HPV): Secondary | ICD-10-CM | POA: Diagnosis not present

## 2024-03-29 DIAGNOSIS — Z124 Encounter for screening for malignant neoplasm of cervix: Secondary | ICD-10-CM | POA: Insufficient documentation

## 2024-03-29 DIAGNOSIS — Z8544 Personal history of malignant neoplasm of other female genital organs: Secondary | ICD-10-CM | POA: Diagnosis not present

## 2024-03-29 DIAGNOSIS — I10 Essential (primary) hypertension: Secondary | ICD-10-CM

## 2024-03-29 DIAGNOSIS — D329 Benign neoplasm of meninges, unspecified: Secondary | ICD-10-CM | POA: Diagnosis not present

## 2024-03-29 DIAGNOSIS — R87610 Atypical squamous cells of undetermined significance on cytologic smear of cervix (ASC-US): Secondary | ICD-10-CM | POA: Insufficient documentation

## 2024-03-29 DIAGNOSIS — I739 Peripheral vascular disease, unspecified: Secondary | ICD-10-CM | POA: Diagnosis not present

## 2024-03-29 DIAGNOSIS — M542 Cervicalgia: Secondary | ICD-10-CM

## 2024-03-29 DIAGNOSIS — G8929 Other chronic pain: Secondary | ICD-10-CM

## 2024-03-29 DIAGNOSIS — R2689 Other abnormalities of gait and mobility: Secondary | ICD-10-CM

## 2024-03-29 DIAGNOSIS — E519 Thiamine deficiency, unspecified: Secondary | ICD-10-CM

## 2024-03-29 DIAGNOSIS — K219 Gastro-esophageal reflux disease without esophagitis: Secondary | ICD-10-CM | POA: Diagnosis not present

## 2024-03-29 DIAGNOSIS — I700 Atherosclerosis of aorta: Secondary | ICD-10-CM | POA: Diagnosis not present

## 2024-03-29 DIAGNOSIS — E7800 Pure hypercholesterolemia, unspecified: Secondary | ICD-10-CM

## 2024-03-29 DIAGNOSIS — Z01419 Encounter for gynecological examination (general) (routine) without abnormal findings: Secondary | ICD-10-CM | POA: Diagnosis present

## 2024-03-29 LAB — POCT GLYCOSYLATED HEMOGLOBIN (HGB A1C): Hemoglobin A1C: 5.7 % — AB (ref 4.0–5.6)

## 2024-03-29 MED ORDER — DILTIAZEM HCL ER COATED BEADS 180 MG PO CP24: 180.0000 mg | ORAL_CAPSULE | Freq: Every evening | ORAL | 1 refills | Status: AC

## 2024-03-29 MED ORDER — ROSUVASTATIN CALCIUM 10 MG PO TABS: 10.0000 mg | ORAL_TABLET | Freq: Every day | ORAL | 3 refills | Status: AC

## 2024-03-29 NOTE — Progress Notes (Signed)
 Name: Vickie Stewart   MRN: [id number]    DOB: 26-Sep-1945   Date:03/29/2024       Progress Note  Subjective  Chief Complaint  Chief Complaint  Patient presents with   Medical Management of Chronic Issues   Discussed the use of AI scribe software for clinical note transcription with the patient, who gave verbal consent to proceed.  History of Present Illness Vickie Stewart is a 78 year old female who presents for a regular follow-up.  She experiences chronic neck pain that worsens with prolonged sitting. She remains active by walking daily, often at the mall, and uses a cane for balance, especially when outside the home. Dizziness and a sensation of being off balance occur, particularly when sitting for extended periods. She reports that her balance problems started after her meningioma diagnosis and treatment.  Her meningioma was treated with radiation, and follow-up imaging in March 2025 showed stable size with some mass effect against the right frontal lobe. She experiences headaches described as pressure, particularly when sitting for long periods. No double vision, vomiting, or other neurological symptoms. She uses Tylenol  for headaches and occasionally ibuprofen for back pain.  She has a history of high blood pressure, managed with diltiazem  180 mg and olmesartan  40 mg, with generally good control except during stress. No chest pain or palpitations, but she reports heartburn and left upper chest sharp pain intermittently and states improves with omeprazole  20 mg  that she has been taking prn only now   She has a history of steroid-induced diabetes, and her A1c is now 5.7. She does not take diabetes medication. She also has a history of high cholesterol and small vessel disease brain plus aorta atherosclerosis , for which she takes rosuvastatin . No muscle pain associated with this medication.  She uses a CPAP machine for sleep apnea but experiences facial dryness  from the mask.  She has a history of vaginal cancer treated years ago; last pap two weeks ago showed ASCUS we will repeat pap smear and vaginal smear today   She takes vitamin D3 supplements and bran flakes for calcium  intake, with previous high calcium  levels now normalized. She takes vitamin B1 twice weekly due to previous high levels.    Patient Active Problem List   Diagnosis Date Noted   Bilateral leg weakness 12/09/2022   Lower extremity weakness 12/04/2022   Thrombocytopenia 12/04/2022   Pre-diabetes 12/04/2022   ASCUS of cervix with negative high risk HPV 01/07/2022   Occipital neuralgia of left side 01/07/2022   Balance problems 01/07/2022   Vertigo 01/07/2022   Pulsatile tinnitus of right ear 06/18/2020   Pulsatile neck mass 06/18/2020   OSA (obstructive sleep apnea) 09/19/2019   Anxiety and depression 09/19/2019   Other neutropenia 08/11/2019   Left thyroid  nodule 08/08/2019   Chronic neck and back pain 01/03/2019   Meningioma (HCC) 10/04/2017   Lichen sclerosus 03/24/2016   Vaginal atrophy 03/18/2016   Encounter for follow-up surveillance of vaginal cancer 03/18/2016   Eczema 10/31/2015   Left hand pain 09/10/2015   Osteopenia after menopause 07/26/2015   Menopause 07/26/2015   Irritable colon 07/26/2015   History of cancer of vagina 07/26/2015   Diffuse cystic mastopathy 07/26/2015   Obesity (BMI 30.0-34.9) 07/26/2015   Intermittent low back pain 07/26/2015   Hyperlipemia 07/26/2015   Osteoarthritis 05/13/2015   Allergic rhinitis 05/13/2015   Gastroesophageal reflux disease without esophagitis 05/13/2015   Essential hypertension, benign     Past Surgical History:  Procedure Laterality Date   BREAST EXCISIONAL BIOPSY Left    neg   BREAST LUMPECTOMY Left    COLONOSCOPY  08/26/2006   COLONOSCOPY WITH PROPOFOL  N/A 10/21/2016   Procedure: COLONOSCOPY WITH PROPOFOL ;  Surgeon: Dellie Louanne MATSU, MD;  Location: [facility];  Service: Endoscopy;   Laterality: N/A;   ESOPHAGOGASTRODUODENOSCOPY (EGD) WITH PROPOFOL  N/A 02/07/2020   Procedure: ESOPHAGOGASTRODUODENOSCOPY (EGD) WITH PROPOFOL ;  Surgeon: Unk Corinn Skiff, MD;  Location: [facility];  Service: Gastroenterology;  Laterality: N/A;    Family History  Problem Relation Age of Onset   Pulmonary embolism Mother    Hypertension Father    Aortic stenosis Father    Breast cancer Neg Hx     Social History   Tobacco Use   Smoking status: Never   Smokeless tobacco: Never   Tobacco comments:    experimented with dip snuff as a child  Substance Use Topics   Alcohol use: No     Current Outpatient Medications:    ascorbic acid (VITAMIN C) 500 MG tablet, Take 500 mg by mouth daily., Disp: , Rfl:    aspirin  81 MG tablet, Take 81 mg by mouth daily. Reported on 10/10/2015, Disp: , Rfl:    cholecalciferol  (VITAMIN D ) 1000 units tablet, Take 1,000 Units by mouth daily., Disp: , Rfl:    diltiazem  (CARDIZEM  CD) 180 MG 24 hr capsule, Take 1 capsule (180 mg total) by mouth every evening., Disp: 90 capsule, Rfl: 1   latanoprost  (XALATAN ) 0.005 % ophthalmic solution, PLACE 1 DROP IN BOTH EYES AT BEDTIME, Disp: , Rfl: 4   magnesium oxide (MAG-OX) 400 (240 Mg) MG tablet, Take 400 mg by mouth daily., Disp: , Rfl:    olmesartan  (BENICAR ) 40 MG tablet, TAKE 1 TABLET BY MOUTH EVERY DAY IN THE MORNING, Disp: 90 tablet, Rfl: 1   Omega-3 Fatty Acids (FISH OIL CONCENTRATE PO), Take by mouth. Reported on 10/10/2015, Disp: , Rfl:    omeprazole  (PRILOSEC) 20 MG capsule, Take 1 capsule (20 mg total) by mouth 2 (two) times daily before a meal., Disp: 180 capsule, Rfl: 1   rosuvastatin  (CRESTOR ) 10 MG tablet, Take 1 tablet (10 mg total) by mouth daily., Disp: 90 tablet, Rfl: 3   triamcinolone  cream (KENALOG ) 0.1 %, Apply 1 Application topically 2 (two) times daily., Disp: 453.6 g, Rfl: 0  Allergies  Allergen Reactions   Atorvastatin     difficulty concentrating and focusing   Prednisone Swelling    Terbinafine And Related Dermatitis    Rash, itch, skin discoloration   Zetia  [Ezetimibe ] Other (See Comments)    Muscle/joint pain    I personally reviewed active problem list, medication list, allergies, family history with the patient/caregiver today.   ROS  Ten systems reviewed and is negative except as mentioned in HPI    Objective Physical Exam  CONSTITUTIONAL: Patient appears well-developed and well-nourished.  No distress. HEENT: Head atraumatic, normocephalic, neck supple. CARDIOVASCULAR: Normal rate, regular rhythm and normal heart sounds.  No murmur heard. No BLE edema. PULMONARY: Effort normal and breath sounds normal. No respiratory distress. ABDOMINAL: There is no tenderness or distention. MUSCULOSKELETAL: slow gait, using a cane PELVIC: vaginal atrophy present, cervix looks normal, , normal bimanual exam  PSYCHIATRIC: Patient has a normal mood and affect. behavior is normal. Judgment and thought content normal.  Vitals:   03/29/24 1024  BP: 128/70  Pulse: 63  Resp: 16  SpO2: 99%  Weight: 177 lb (80.3 kg)  Height: 5' (1.524 m)  Body mass index is 34.57 kg/m.  Recent Results (from the past 2160 hours)  POCT glycosylated hemoglobin (Hb A1C)     Status: Abnormal   Collection Time: 03/29/24 10:52 AM  Result Value Ref Range   Hemoglobin A1C 5.7 (A) 4.0 - 5.6 %   HbA1c POC (<> result, manual entry)     HbA1c, POC (prediabetic range)     HbA1c, POC (controlled diabetic range)      PHQ2/9:    02/01/2024   10:53 AM 08/19/2023    1:58 PM 05/14/2023   11:01 AM 02/10/2023   11:31 AM 01/18/2023    1:35 PM  Depression screen PHQ 2/9  Decreased Interest 0 0 0 0 0  Down, Depressed, Hopeless 0 0 0 0 0  PHQ - 2 Score 0 0 0 0 0  Altered sleeping  0 0 0   Tired, decreased energy  0 0 0   Change in appetite  0 0 0   Feeling bad or failure about yourself   0 0 0   Trouble concentrating  0 0 0   Moving slowly or fidgety/restless  0 0 0   Suicidal thoughts  0 0 0    PHQ-9 Score  0 0 0   Difficult doing work/chores  Not difficult at all Not difficult at all      phq 9 is negative  Fall Risk:    02/01/2024   10:53 AM 09/29/2023   10:42 AM 08/19/2023    2:00 PM 02/10/2023   11:30 AM 01/18/2023    1:34 PM  Fall Risk   Falls in the past year? 0 1 0 1 1  Number falls in past yr: 0 0 0 1 1  Injury with Fall? 0 0 0 1 1  Risk for fall due to : No Fall Risks Impaired balance/gait No Fall Risks Impaired balance/gait Impaired balance/gait  Follow up Falls evaluation completed Falls evaluation completed;Education provided;Falls prevention discussed Falls prevention discussed;Falls evaluation completed Falls prevention discussed Falls prevention discussed    Assessment & Plan Benign meningioma with mass effect and stable edema Meningioma stable, no size change. Mass effect causes balance issues and leg weakness. Headaches linked to pressure changes. Radiation treated, edema stable. - Encourage regular movement to alleviate headaches. - Continue using a cane for balance support.  Chronic neck and lower back pain Pain exacerbated by prolonged sitting. Managed with Tylenol  and occasional ibuprofen. - Encourage regular movement and gentle neck exercises. - Use Tylenol  for pain management. - Use ibuprofen sparingly for back pain.  Essential hypertension Blood pressure well-controlled with diltiazem  and olmesartan . Occasional stress-related elevations. - Continue diltiazem  and olmesartan . - Monitor blood pressure regularly.  Atherosclerosis of aorta and cerebral small vessel disease Managed with rosuvastatin . No side effects reported. - Refill rosuvastatin  prescription. - Continue monitoring cholesterol levels.  Gastroesophageal reflux disease (GERD) GERD managed with omeprazole . Symptoms include heartburn and left shoulder pain. - Continue omeprazole  20 mg as needed for heartburn.  Obstructive sleep apnea Reports facial dryness. - Continue CPAP  therapy. - Apply lotion to face to manage dryness.  Thiamine  (vitamin B1) deficiency (controlled) Deficiency controlled with reduced supplementation. High B1 levels noted in April. - Continue B1 supplementation twice weekly. - Recheck B1 levels as needed.  Vaginal cancer Previous ASCUS Pap smear in 2023. No recent GYN follow-up.  -Collected pap smear today  -  Consider referral to GYN for further evaluation.

## 2024-03-31 LAB — CYTOLOGY - PAP
Comment: NEGATIVE
Comment: NEGATIVE
Diagnosis: NEGATIVE
Diagnosis: NEGATIVE
High risk HPV: NEGATIVE
High risk HPV: NEGATIVE

## 2024-04-04 ENCOUNTER — Ambulatory Visit: Payer: Self-pay | Admitting: Family Medicine

## 2024-05-26 ENCOUNTER — Other Ambulatory Visit: Payer: Self-pay | Admitting: Family Medicine

## 2024-05-26 DIAGNOSIS — K219 Gastro-esophageal reflux disease without esophagitis: Secondary | ICD-10-CM

## 2024-05-29 NOTE — Telephone Encounter (Signed)
 Requested Prescriptions  Pending Prescriptions Disp Refills   omeprazole  (PRILOSEC) 20 MG capsule [Pharmacy Med Name: OMEPRAZOLE  DR 20 MG CAPSULE] 180 capsule 0    Sig: TAKE 1 CAPSULE (20 MG TOTAL) BY MOUTH 2 (TWO) TIMES DAILY BEFORE A MEAL.     Gastroenterology: Proton Pump Inhibitors Passed - 05/29/2024 11:39 AM      Passed - Valid encounter within last 12 months    Recent Outpatient Visits           2 months ago Meningioma [facility])   [place] [facility] Glenard Mire, MD   3 months ago Encounter for [employer] annual wellness exam   [facility] [place] [place] Sowles, Krichna, MD   8 months ago Steroid-induced diabetes mellitus, subsequent encounter   [facility] Sowles, Krichna, MD

## 2024-06-13 ENCOUNTER — Encounter: Payer: Self-pay | Admitting: Podiatry

## 2024-06-13 ENCOUNTER — Ambulatory Visit

## 2024-06-13 ENCOUNTER — Ambulatory Visit: Admitting: Podiatry

## 2024-06-13 VITALS — Ht 60.0 in | Wt 177.0 lb

## 2024-06-13 DIAGNOSIS — M722 Plantar fascial fibromatosis: Secondary | ICD-10-CM | POA: Diagnosis not present

## 2024-06-13 MED ORDER — BETAMETHASONE SOD PHOS & ACET 6 (3-3) MG/ML IJ SUSP
3.0000 mg | Freq: Once | INTRAMUSCULAR | Status: AC
  Administered 2024-06-13: 3 mg via INTRA_ARTICULAR

## 2024-06-13 NOTE — Progress Notes (Signed)
" ° °  Chief Complaint  Patient presents with   Plantar Fasciitis    Pt is here due to plantar fasciitis in the left foot, states she has been having on and off heel pain for a while states the pain is the same as before.    Subjective: 79 y.o. female presenting today for evaluation of plantar fasciitis to the left heel.  Over the past month she has increased her activity and noticed some soreness and tenderness to the plantar fascia.  Chronic history of plantar fasciitis  Past Medical History:  Diagnosis Date   Allergic rhinitis, cause unspecified    Anxiety state, unspecified    Contact dermatitis and other eczema, due to unspecified cause    Diffuse cystic mastopathy    Dyspepsia and other specified disorders of function of stomach    Essential hypertension, benign    Glaucoma    Irritable bowel syndrome    Lateral epicondylitis  of elbow    Leukocytopenia, unspecified    Mastodynia    Obesity, unspecified    Pure hypercholesterolemia    Reflux esophagitis    Thoracic or lumbosacral neuritis or radiculitis, unspecified    Unspecified disorder of skin and subcutaneous tissue    Vaginal cancer (HCC)    History     Objective: Physical Exam General: The patient is alert and oriented x3 in no acute distress.  Dermatology: Skin is warm, dry and supple bilateral lower extremities. Negative for open lesions or macerations bilateral.   Vascular: Dorsalis Pedis and Posterior Tibial pulses palpable bilateral.  Capillary fill time is immediate to all digits.  Neurological: Grossly intact via light touch  Musculoskeletal: tenderness to palpation to the plantar aspect of the left heel along the plantar fascia. All other joints range of motion within normal limits bilateral. Strength 5/5 in all groups bilateral.   Radiographic exam 09/30/2021: Normal osseous mineralization. Joint spaces preserved. No fracture/dislocation/boney destruction. No other soft tissue abnormalities or radiopaque  foreign bodies.   Assessment: 1. Plantar fasciitis left foot  Plan of Care:  -Patient evaluated -Injection of 0.5 cc Celestone  Soluspan injected in the plantar fascia left.  Patient known to tolerate in the past and has responded well to injections -Continue wearing good supportive tennis shoes and sneakers.  Refrain from going barefoot -Return to clinic PRN   Thresa EMERSON Sar, DPM [facility]  Dr. Thresa EMERSON Sar, DPM    [address] [place], [place] [address]                Office [phone]  Fax [phone]    "

## 2024-08-24 ENCOUNTER — Ambulatory Visit

## 2024-10-04 ENCOUNTER — Ambulatory Visit: Admitting: Family Medicine

## 2025-02-06 ENCOUNTER — Encounter: Admitting: Family Medicine
